# Patient Record
Sex: Female | Born: 1940 | Race: White | Hispanic: No | Marital: Married | Smoking: Never smoker
Health system: Southern US, Community
[De-identification: ages and names within clinical notes are randomized; demographics above are authoritative.]

## PROBLEM LIST (undated history)

## (undated) DIAGNOSIS — Z8669 Personal history of other diseases of the nervous system and sense organs: Secondary | ICD-10-CM

## (undated) DIAGNOSIS — H269 Unspecified cataract: Secondary | ICD-10-CM

## (undated) DIAGNOSIS — I2510 Atherosclerotic heart disease of native coronary artery without angina pectoris: Secondary | ICD-10-CM

## (undated) DIAGNOSIS — K219 Gastro-esophageal reflux disease without esophagitis: Secondary | ICD-10-CM

## (undated) DIAGNOSIS — K5900 Constipation, unspecified: Secondary | ICD-10-CM

## (undated) DIAGNOSIS — M2550 Pain in unspecified joint: Secondary | ICD-10-CM

## (undated) DIAGNOSIS — K5790 Diverticulosis of intestine, part unspecified, without perforation or abscess without bleeding: Secondary | ICD-10-CM

## (undated) DIAGNOSIS — C50919 Malignant neoplasm of unspecified site of unspecified female breast: Secondary | ICD-10-CM

## (undated) DIAGNOSIS — I10 Essential (primary) hypertension: Secondary | ICD-10-CM

## (undated) DIAGNOSIS — E876 Hypokalemia: Secondary | ICD-10-CM

## (undated) DIAGNOSIS — Z923 Personal history of irradiation: Secondary | ICD-10-CM

## (undated) DIAGNOSIS — R600 Localized edema: Secondary | ICD-10-CM

## (undated) DIAGNOSIS — E039 Hypothyroidism, unspecified: Secondary | ICD-10-CM

## (undated) DIAGNOSIS — E785 Hyperlipidemia, unspecified: Secondary | ICD-10-CM

## (undated) DIAGNOSIS — R609 Edema, unspecified: Secondary | ICD-10-CM

## (undated) DIAGNOSIS — M549 Dorsalgia, unspecified: Secondary | ICD-10-CM

## (undated) DIAGNOSIS — Z960 Presence of urogenital implants: Secondary | ICD-10-CM

## (undated) DIAGNOSIS — M2540 Effusion, unspecified joint: Secondary | ICD-10-CM

## (undated) DIAGNOSIS — G8929 Other chronic pain: Secondary | ICD-10-CM

## (undated) DIAGNOSIS — M1990 Unspecified osteoarthritis, unspecified site: Secondary | ICD-10-CM

## (undated) DIAGNOSIS — G4700 Insomnia, unspecified: Secondary | ICD-10-CM

## (undated) HISTORY — PX: COLONOSCOPY: SHX174

## (undated) HISTORY — PX: TONSILLECTOMY: SUR1361

## (undated) HISTORY — PX: ABDOMINAL HYSTERECTOMY: SHX81

## (undated) HISTORY — PX: CORONARY ANGIOPLASTY: SHX604

---

## 1993-02-14 HISTORY — PX: OTHER SURGICAL HISTORY: SHX169

## 1997-02-14 HISTORY — PX: CARDIAC CATHETERIZATION: SHX172

## 2000-06-26 ENCOUNTER — Inpatient Hospital Stay (HOSPITAL_COMMUNITY): Admission: RE | Admit: 2000-06-26 | Discharge: 2000-06-30 | Payer: Self-pay | Admitting: Neurosurgery

## 2000-09-29 ENCOUNTER — Ambulatory Visit (HOSPITAL_COMMUNITY): Admission: RE | Admit: 2000-09-29 | Discharge: 2000-09-29 | Payer: Self-pay | Admitting: Neurosurgery

## 2002-06-27 ENCOUNTER — Observation Stay (HOSPITAL_COMMUNITY): Admission: RE | Admit: 2002-06-27 | Discharge: 2002-06-28 | Payer: Self-pay | Admitting: Neurosurgery

## 2003-10-25 ENCOUNTER — Ambulatory Visit (HOSPITAL_COMMUNITY): Admission: RE | Admit: 2003-10-25 | Discharge: 2003-10-25 | Payer: Self-pay | Admitting: Neurosurgery

## 2004-02-03 ENCOUNTER — Ambulatory Visit: Payer: Self-pay | Admitting: Internal Medicine

## 2004-06-24 ENCOUNTER — Ambulatory Visit: Payer: Self-pay | Admitting: Internal Medicine

## 2004-08-22 ENCOUNTER — Ambulatory Visit: Payer: Self-pay | Admitting: Internal Medicine

## 2005-02-28 ENCOUNTER — Ambulatory Visit: Payer: Self-pay | Admitting: Internal Medicine

## 2005-11-30 ENCOUNTER — Ambulatory Visit: Payer: Self-pay | Admitting: Internal Medicine

## 2006-01-06 ENCOUNTER — Other Ambulatory Visit: Payer: Self-pay

## 2006-01-06 ENCOUNTER — Emergency Department: Payer: Self-pay | Admitting: Emergency Medicine

## 2006-03-02 ENCOUNTER — Ambulatory Visit: Payer: Self-pay | Admitting: Internal Medicine

## 2006-03-08 ENCOUNTER — Ambulatory Visit: Payer: Self-pay | Admitting: Internal Medicine

## 2006-03-16 ENCOUNTER — Ambulatory Visit: Payer: Self-pay | Admitting: Internal Medicine

## 2006-05-25 ENCOUNTER — Ambulatory Visit: Payer: Self-pay

## 2006-05-27 ENCOUNTER — Emergency Department: Payer: Self-pay

## 2006-08-31 ENCOUNTER — Inpatient Hospital Stay (HOSPITAL_COMMUNITY): Admission: RE | Admit: 2006-08-31 | Discharge: 2006-09-04 | Payer: Self-pay | Admitting: Orthopaedic Surgery

## 2006-10-23 ENCOUNTER — Ambulatory Visit: Payer: Self-pay | Admitting: Orthopaedic Surgery

## 2006-11-07 ENCOUNTER — Inpatient Hospital Stay (HOSPITAL_COMMUNITY): Admission: EM | Admit: 2006-11-07 | Discharge: 2006-11-11 | Payer: Self-pay | Admitting: Emergency Medicine

## 2006-11-10 ENCOUNTER — Ambulatory Visit: Payer: Self-pay | Admitting: Infectious Diseases

## 2006-11-13 ENCOUNTER — Ambulatory Visit: Payer: Self-pay | Admitting: Internal Medicine

## 2006-11-15 ENCOUNTER — Emergency Department: Payer: Self-pay | Admitting: Emergency Medicine

## 2006-11-15 ENCOUNTER — Other Ambulatory Visit: Payer: Self-pay

## 2006-11-24 ENCOUNTER — Ambulatory Visit: Payer: Self-pay | Admitting: Internal Medicine

## 2006-11-27 ENCOUNTER — Ambulatory Visit: Payer: Self-pay | Admitting: Internal Medicine

## 2006-12-04 ENCOUNTER — Ambulatory Visit: Payer: Self-pay | Admitting: Internal Medicine

## 2006-12-11 ENCOUNTER — Ambulatory Visit: Payer: Self-pay | Admitting: Internal Medicine

## 2007-02-15 HISTORY — PX: BACK SURGERY: SHX140

## 2007-03-27 ENCOUNTER — Ambulatory Visit: Payer: Self-pay | Admitting: Internal Medicine

## 2007-03-30 ENCOUNTER — Ambulatory Visit: Payer: Self-pay | Admitting: Internal Medicine

## 2007-07-05 ENCOUNTER — Ambulatory Visit: Payer: Self-pay

## 2008-04-15 ENCOUNTER — Ambulatory Visit: Payer: Self-pay | Admitting: Internal Medicine

## 2008-12-29 ENCOUNTER — Ambulatory Visit: Payer: Self-pay | Admitting: Unknown Physician Specialty

## 2009-05-18 ENCOUNTER — Ambulatory Visit: Payer: Self-pay | Admitting: Internal Medicine

## 2010-03-10 ENCOUNTER — Ambulatory Visit: Payer: Self-pay | Admitting: Internal Medicine

## 2010-06-14 ENCOUNTER — Ambulatory Visit: Payer: Self-pay | Admitting: Anesthesiology

## 2010-06-29 NOTE — Discharge Summary (Signed)
Laurie Hale, Laurie Hale                   ACCOUNT NO.:  [id number]   MEDICAL RECORD NO.:  [id number]          PATIENT TYPE:  INP   LOCATION:  6739                         FACILITY:  [facility]   PHYSICIAN:  Wilson Singer, M.D.DATE OF BIRTH:  04/03/1940   DATE OF ADMISSION:  11/06/2006  DATE OF DISCHARGE:  11/11/2006                               DISCHARGE SUMMARY   FINAL DISCHARGE DIAGNOSES:  1. Low back pain secondary probably to cystitis in the region of S1.  2. Hypertension.   CONDITION ON DISCHARGE:  Stable.   MEDICATIONS ON DISCHARGE:  1. Diovan 160 mg daily.  2. Ibuprofen 800 mg t.i.d.  3. Clonazepam 1 mg t.i.d.  4. Crestor 5 mg daily.  5. Skelaxin 800 mg q.i.d.  6. Ciprofloxacin 500 mg b.i.d. for 6 weeks.  7. Rifampicin 300 mg b.i.d. for 6 weeks.  8. Vancomycin IV daily for 6 weeks.  Pharmacy to monitor dose.      Currently at 1250 mg q.12h.  9. Tramadol 50 mg 2 tablets q.i.d. as needed.   HISTORY:  This very pleasant 70 year old lady was admitted complaining  of left low back and buttock pain.  Please see initial history and  physical examination done by Dr. Lilly Cove.   HOSPITAL COURSE:  The patient had an MRI of lumbosacral spine done, and  there were no major abnormalities except for some fluid.  The  radiologist felt there was significant diskitis in the L5-S1 region.  Dr. Sharolyn Douglas felt that this may not necessarily be the case.  However,  the patient underwent through interventional radiology an L5-S1 disk  aspiration and also fluid aspirate at the L3-L4 area.  So far the fluid  and the disk aspirated not growing organisms, and gram stain has been  negative.  Infectious disease was consulted, and they have added  rifampicin to the antibiotic regimen which includes vancomycin and  ciprofloxacin.  Therefore, the patient will continue with all the 3  antibiotics for about 6 weeks or unless Dr. Noel Gerold feels it would be  reasonable to discontinue them earlier.   EXAMINATION AND LABS ON DISCHARGE:  The patient has improved somewhat  since her admission.  Today she is feeling well but slightly weak.  Temperature 98.6, blood pressure 123/74, pulse 76, saturation 94% on  room air.  Investigations today show hemoglobin of 8.9, white blood cell  count 7.1, platelets 233.  Sodium 141, potassium 2.8, bicarbonate 26,  BUN 9, creatinine 0.65.   DISPOSITION:  We are giving her potassium chloride today to replete her  potassium, and this will need to be checked.  Also, she is anemic and  her hemoglobin will need to also be checked.  I suspect the anemia  reflects her chronic disease with the diskitis and possible  osteomyelitis.  As mentioned above, she will have a Home Health  Registered Nurse given the intravenous vancomycin, and the pharmacy will  note if that dose is adequate.  She has a PICC line in situ which will  enable the vancomycin to be given.  She will also receive  Home Health,  physical and occupational therapy to help her.      Wilson Singer, M.D.  Electronically Signed     NCG/MEDQ  D:  11/11/2006  T:  11/12/2006  Job:  [id number]

## 2010-06-29 NOTE — Op Note (Signed)
NAMEGAILYA, TAUER NO.:  [id number]   MEDICAL RECORD NO.:  [id number]          PATIENT TYPE:  INP   LOCATION:  5031                         FACILITY:  [facility]   PHYSICIAN:  Sharolyn Douglas, M.D.        DATE OF BIRTH:  04/08/1940   DATE OF PROCEDURE:  08/31/2006  DATE OF DISCHARGE:                               OPERATIVE REPORT   DIAGNOSES:  1. Lumbar degenerative scoliosis.  2. Lumbar degenerative spondylolisthesis.  3. Lumbar spinal stenosis.  4. Status post previous L4-5 and L3-4 laminectomy.  5. Intractable back and bilateral lower extremity pain, left greater      than right.   PROCEDURES:  1. Revision lumbar laminectomy bilaterally at L3-4 and revision      hemilaminectomies on the left side at L4-5 and L5-S1.  2. Posterior spinal arthrodesis, T10 through S1.  3. Segmental spinal instrumentation using the Abbott Spine System from      T10 down to S1 using pedicle screws and Universal clamp.  4. Transforaminal lumbar interbody fusion, L3-4 and L5-S1, with      placement of two PEEK cages.  5. Local autogenous bone graft supplemented with 45 mL Grafton      allograft and BMP.   SURGEON:  Sharolyn Douglas, MD.   ASSISTANTJill Side Mahar, PA   ANESTHESIA:  General endotracheal.   ESTIMATED BLOOD LOSS:  1500 mL, 3 units returned from Cell Saver and 2  units of allograft blood.   COMPLICATIONS:  None.   NEEDLE AND SPONGE COUNT:  Correct.   INDICATIONS:  The patient is a pleasant 70 year old female with  progressive back and bilateral lower extremity pain.  Her imaging  studies show a collapsing degenerative scoliosis with severe foraminal  and lateral recess narrowing at L3-4 and L4-5.  She has been refractory  to all conservative treatment modalities.  She failed a laminectomy  performed by a neurosurgeon.  She now presents for revision  decompression and fusion to stabilize her degenerative scoliosis.  Risks, benefits, and alternatives were reviewed.  The  patient elected to  proceed.   PROCEDURE:  After informed consent, she was taken to the operating room.  She underwent general endotracheal anesthesia without difficulty.  She  was given prophylactic IV antibiotics.  Neuro monitoring was established  in form of lower extremity EMGs and SSEPs.  The patient was carefully  turned prone onto the [employer] four-poster positioning frame, all bony  prominences padded, face and eyes protected at all times.  Back prepped,  draped in the usual sterile fashion.  A midline incision made from T10  down the sacrum.  Dissection was carried sharply through the deep fascia  and a careful subperiosteal exposure was carried out to the tips of the  transverse processes from T10 all the way down to the sacral ala.  Intraoperative x-rays were taken to confirm the levels.  Deep retractors  were placed.  The bony elements were degenerative and hypertrophied.  The previous laminotomy defects were noticed at L3-4 and L4-5 on the  left side.  The  exposure was quite difficult due to the amount of  arthritis.  Once the bony elements had been cleaned, osteotomies were  completed of the facet joints from T10-11 all the way down to L5-S1 in  preparation for the arthrodesis.  We then turned our attention to  placing pedicle screws on the left side.  We found the pedicles to be  quite small and again, due to the distorted anatomy, placement of screws  was quite difficult.  We utilized 5.5 x 35-mm screw at T11, 5.5 x 40-mm  screw at T12, 5.5 x 40-mm screw at L1, 5.5 x 40-mm screw and L2, 6.5 x  45-mm screws at L3 and L4, 6.5 x 35-mm screw at L5, and 7.5 x 30-mm  screws in the sacrum.  The bone quality was quite good and the screw  purchase was excellent.  We then prepared laminotomies at T9-10 and T10-  11 for placement of a Universal clamp.  Small laminotomies were created  by removing the ligamentum flavum, and this allowed for careful passage  of the universal clamp  underneath the lamina.  Great care was to avoid  putting any pressure on the underlying dura.  The Universal clamps were  placed x2, one on the left side and one on the right side at T10.  We  then turned our attention to placing Universal clamps on the right side  at T10, T11, T12, L1 and L2 using a similar technique.  We attempted to  place a pedicle screw starting at L1 on the right side but again found  that the pedicles were so small that we elected to abort placing screws  at these segments and simply used the Universal clamps.  We did place a  pedicle screw at L3 on the right side, which measured 6.5 x 45 mm and  also on the right side at L5, which measured 6.5 x 45 mm.  In the sacrum  a 7.5 x 30-mm screw was placed on the right.  Again the screw purchase  was excellent.  Each screw was stimulated using triggered EMGs and there  were no deleterious changes.  In addition, before placing the pedicle  screws they were palpated with a ball-tip feeler and there were no  breaches noted.   Once the instrumentation had been placed throughout the thoracolumbar  spine, we turned our attention to performing a revision laminectomy.  Starting at L3-4, the previous laminotomy defect was uncovered using  curettes and the interval between the dura and remaining lamina was  identified.  Using the high-speed bur and Kerrison punches, the  laminectomy was completed.  Again this was quite difficult due to the  amount of bony hypertrophy, with collapse of the spine within the  concavity of the curve, and also the previous surgery.  As the  decompression continued, we encountered a Prolene stitch which was in  the dura from her previous operation, which appeared to be on the nerve  root takeoff of L5.  There did not appear to be any CSF leakage.  The  nerve root itself appeared to be adherent to the underlying disk space  at L4-5.  We found that the lateral recess and foramen was severely  stenotic due  to the collapse and bony overgrowth.  Once we completed the  neurolysis and decompression, it was carried across the midline and to  the contralateral side, again identifying the L5 nerve root on the right  side.  We then performed hemilaminectomies  at L3-4, again encountering  some scar from the previous operation, and neurolysis was accomplished.  Because of the collapse, the rotatory subluxation as well as the  spondylolisthesis, the lateral recess was very stenotic at this level as  well.  We identified the L4 nerve root, which was widely decompressed,  and also the L3 nerve root as it was exiting, which was stenotic within  the foramen.  We also performed a hemilaminectomy at L5-S1 on the left  side.  Again we found stenosis in the lateral recess although not as bad  as the superjacent segments.   At this point we turned our attention to performing transforaminal  lumbar interbody fusions at L3-4 on the left and also at L5-S1 on the  left.  The interbody fusions were completed in order to further reduce  the scoliosis, decompress the neural foramen, and improve the fusion  rate.  The remaining facet joints were osteotomized at L3-4 and L5-S1.  The exiting transversing nerve roots were identified and protected at  all times.  Free-running EMGs were monitored.  Starting at L3-4, disk  space was entered and dilated up to 8 mm.  A radical diskectomy was  completed, decorticating the cartilaginous endplates.  The disk space  was packed with local bone graft along with Grafton allograft which had  been mixed with OP1 BMP.  A 9-mm FIGI PEEK cage was then packed with BMP-  Grafton mixture, inserted into the interspace, and tamped anteriorly.  It was purposely kept on the left side in order to bring about a partial  reduction of the concavity of the scoliosis.  We then performed a  similar procedure at L5-S1.  At this level we utilized an 11-mm FIGI  cage.  This time the cage was inserted  more obliquely.  Again the disk  space was cleaned and packed with the Grafton-BMP mixture as well as  local bone graft from the laminectomy.  During the TLIF procedures,  there were no deleterious changes in the EMGs.   At this point we turned our attention to completing the posterior spinal  arthrodesis by decorticating the transverse processes, sacral ala and  pars interarticularis.  We then packed the remaining bone graft mixture  of Grafton, BMP and local bone into the lateral gutters from T10 all the  way down to the sacrum.  We cut titanium rods to length, which were bent  into normal lordosis.  The rods were placed into the polyaxial screw  heads and the Universal clamps.  The Universal clamps were sequentially  tightened.  The locking caps were sheared off.  We applied distraction  between the L4-5 segment before shearing off the locking caps to provide  for some distraction of the foramen at this level because of the  previous scarring from the earlier operation.  We took an intraoperative  x-ray, which showed acceptable positioning of the instrumentation.  Hemostasis was achieved.  Gelfoam was left over the exposed epidural  space.  Deep Hemovac drain was left.  The deep fascia was closed with  interrupted #1 Vicryl suture, subcutaneous layer closed with 0 Vicryl  and 2-0 Vicryl, followed by a running 3-0 subcuticular Vicryl suture on  the skin edges.  Dermabond was applied, a sterile dressing placed.  The  patient was turned supine, extubated without difficulty and transferred  to recovery in stable condition neurologically intact.   It should be noted that my assistant, Verlin Fester, PA, was present  throughout the procedure  including the positioning and the exposure.  She worked with me during the decompression using loupes and headlight  magnification, providing suction and retraction of the neural elements.  She then assisted me with the instrumentation and the fusion as  well as  wound closure.      Sharolyn Douglas, M.D.  Electronically Signed     MC/MEDQ  D:  08/31/2006  T:  09/01/2006  Job:  [id number]

## 2010-06-29 NOTE — H&P (Signed)
Laurie Hale, QUE                   ACCOUNT NO.:  [id number]   MEDICAL RECORD NO.:  [id number]          PATIENT TYPE:  INP   LOCATION:  6739                         FACILITY:  [facility]   PHYSICIAN:  Wilson Singer, M.D.DATE OF BIRTH:  07/09/1940   DATE OF ADMISSION:  11/06/2006  DATE OF DISCHARGE:                              HISTORY & PHYSICAL   HISTORY:  This is a 70 year old lady who has been complaining of left  lower back/buttock pain for the last few days.  The reason she is in the  emergency room is that she has been unable to walk because of the pain.  She was seen in the emergency room and evaluated.  A MRI scan of the  lumbar sacral area was done, and there were no major abnormalities seen.  She is now being admitted for essentially pain control.   PAST SURGICAL HISTORY:  1. She had a T10 to S1 scoliosis fusion in July 2008 by Dr. Noel Gerold for      scoliosis.  2. C5-6, C6-7 extensive anterior cervical discectomy/decompression in      May of 2004.  3. Left L4/L5 microdiskectomy by Dr. Lovell Sheehan in May of 2002.   PAST MEDICAL HISTORY:  1. Hypertension.  2. Gastroesophageal reflux disease.  3. Coronary artery disease, stable with stenting procedure in 1999.   MEDICATIONS:  1. Clonazepam 1 mg t.i.d.  2. Diovan 160 mg every day.  3. Atenolol 12.5 mg every day.   ALLERGIES:  PENICILLIN.   SOCIAL HISTORY:  Married.  She does not smoke or drink excessive  alcohol.   REVIEW OF SYSTEMS:  Apart from the symptoms mentioned above, there are  no other symptoms referable to all systems reviewed.   PHYSICAL EXAMINATION:  VITAL SIGNS:  Temperature 98.3, blood pressure  130/78, pulse 80, respiratory rate 12, saturation 95%.  GENERAL:  She looks systemically well and does not appear to be in any  significant pain at rest.  However, on movement she is in some pain.  CARDIOVASCULAR:  Heart sounds are present and normal without murmurs.  LUNGS:  Clear.  ABDOMEN:  Soft, nontender.  NEUROLOGICAL:  There are no obvious focal neurological signs.  MUSCULOSKELETAL:  She appears to have tenderness in the left buttock  area consistent with where the pain is.   INVESTIGATIONS:  Hemoglobin 11.7, white blood cell count 12.9, platelets  335,000.  Sodium 139, potassium 4.4, bicarbonate 30, BUN 23, creatinine  0.61.  ESR 45.  MRI of the lumbar spine reportedly has been essentially  unremarkable with no evidence of abscess or any other acute pathology.  I do not have the full report as of this dictation.   IMPRESSION:  1. Left lower back/buttock pain, etiology unclear.  2. Hypertension.   PLAN:  1. Admit.  2. Analgesics, muscle relaxers, steroids as empirical therapy for      possible muscle spasm.  3. Physical and occupational therapy consults.  4. Monitor/control hypertension.   Further recommendations will depend on the patient's hospital progress.      Wilson Singer, M.D.  Electronically  Signed     NCG/MEDQ  D:  11/07/2006  T:  11/07/2006  Job:  [id number]   cc:   Sharolyn Douglas, M.D.

## 2010-06-29 NOTE — Discharge Summary (Signed)
Laurie Hale, Laurie Hale                   ACCOUNT NO.:  [id number]   MEDICAL RECORD NO.:  [id number]          PATIENT TYPE:  INP   LOCATION:  5031                         FACILITY:  [facility]   PHYSICIAN:  Sharolyn Douglas, M.D.        DATE OF BIRTH:  December 28, 1940   DATE OF ADMISSION:  08/31/2006  DATE OF DISCHARGE:  09/04/2006                               DISCHARGE SUMMARY   ADMITTING DIAGNOSES:  1. Degenerative scoliosis which is progressive.  2. Hypertension  3. Gastroesophageal reflux disease.   DISCHARGE DIAGNOSES:  1. Status post T10-S1 scoliosis fusion.  2. Postoperative blood loss anemia.  3. Postoperative hypokalemia.  4. Hypertension.  5. Gastroesophageal reflux disease.   PROCEDURE:  On August 31, 2006, the patient was taken to the operating  room for T10-S1 posterior spinal fusion with pedicle screws, rods and  universal cages.  Surgeon was Sharolyn Douglas, M.D., assistant [facility],  [facility].  Anesthesia used was general.   CONSULTATION:  None.   LABORATORY DATA:  CBC with differential preoperatively was within normal  limits.  Intraoperatively hemoglobin reached a low of 6.5.  She was  transfused 2 units packed red cells at that time.  Through her  postoperative course, her hemoglobin reached a low of 8.9, hematocrit  25.9.  On September 03, 2006, she was asymptomatic and did not require any  further treatment.  Complete metabolic panel preoperatively was within  normal limits.  Postoperatively, she did develop mild hypokalemia.  She  was treated with K-Dur and this improved up to normal prior to  discharge.  Coags were normal preoperatively.  UA preop was negative.  Blood typing was A+.  Antibody screen was negative.   EKG from August 29, 2006, showed normal sinus rhythm.  Nonspecific ST and  T-wave abnormality, unconfirmed on the chart.   X-rays were used intraoperatively for localization.  The patient will  also be getting a two-view of her lumbar spine prior to discharge today.   BRIEF  HISTORY:  Patient is a 70 year old female who has had a long  history of problems with her back.  She has been followed by Dr. Noel Gerold  for some time now.  Unfortunately, her scoliosis has been getting  progressively worse.  Her function as well has been declining and her  pain has been increasing.  She has failed to respond to conservative  treatment.  It was thought her best course of management to stop the  progression of the scoliosis would be a posterior spinal fusion  procedure.  Risks and benefits of this procedure were discussed at  length with the patient and her family by Dr. Noel Gerold.  She indicated  understanding and opted to proceed.   HOSPITAL COURSE:  On August 31, 2006, the patient was taken to the  operating room for the above procedure.  She tolerated this well without  any intraoperative complications and she was transferred to the recovery  room in stable condition.   Postoperatively, routine orthopedic spine protocol was followed.  She  progressed along very well.   Physical therapy  and occupational therapy were started on daily basis on  a progressive ambulation program.  They also worked on brace use and  back precautions.  She was independent and safe with all the above prior  to discharge.   She did develop mild hypokalemia which was treated with K-Dur and  responded nicely into the normal range by the following day, as  previously dictated.  Her postoperative blood loss anemia was mild and  continues to slowly drift down for several postoperative days.  She did  not develop any symptoms with the anemia.  She was transfused 2 units of  packed red blood cells intraoperatively and also did receive some cell  saver blood returned intraoperatively.  She is not require any  transfusions postoperatively.   By September 04, 2006, the patient had met all orthopedic goals.  She was  medically stable and ready for discharge home.   DISCHARGE/PLAN:  The patient is a 70 year old  female status post  scoliosis fusion doing well.   ACTIVITIES:  Daily ambulation program, brace on when she is up, back  precautions at all times, no lifting greater than 5 pounds, the patient  may shower.  Follow up 2 weeks postop with Dr. Noel Gerold.   DISCHARGE MEDICATIONS:  1. Norco for pain.  2. Robaxin for muscle spasm.  3. Multivitamin daily.  4. Calcium daily.  5. Colace twice daily.  6. Laxative as needed.  7. Avoid NSAIDs.   DIET:  Regular home diet as tolerated.   CONDITION ON DISCHARGE:  Stable, improved.   DISPOSITION:  The patient being discharged to her home with family  assistance, as well as home health physical therapy and occupational  therapy.     Verlin Fester, P.A.      Sharolyn Douglas, M.D.  Electronically Signed   CM/MEDQ  D:  09/04/2006  T:  09/04/2006  Job:  [id number]

## 2010-07-02 NOTE — H&P (Signed)
[place]. [facility]  Patient:    Laurie Hale, Laurie Hale                          MRN: [id number] Adm. Date:  [id number] Attending:  Tressie Stalker D                         History and Physical  CHIEF COMPLAINT:  Back pain and left leg pain.  HISTORY OF PRESENT ILLNESS:  The patient is a 70 year old white female who has had intermittent lumbago.  She began having severe pain in her back and down her left leg at the end of April 2002.  She failed all medical management and was worked up with lumbar MRI demonstrating herniated disk and was sent for my consultation.  The patient complains of left sided low back pain radiating into her left buttock, left hip and down her left leg.  She has numbness in her foot and has not improved despite medical management.  PAST MEDICAL HISTORY:  Positive for hypertension, coronary artery disease, diverticulosis.  PAST SURGICAL HISTORY:  Angioplasty and stenting in 1999.  She has had no recent symptoms.  She had a partial colectomy secondary to diverticulosis and hysterectomy 20 years ago secondary to "infection."  MEDICATIONS PRIOR TO ADMISSION: 1. Zoloft 100 mg p.o. q.d. 2. Lodine 400 mg p.o. q.d. 3. Skelaxin 400 mg t.i.d. p.r.n. 4. Atenolol 12.5 mg p.o. q.d. 5. Diovan 80 mg p.o. q.d. 6. Pravachol 10 mg p.o. q.d. 7. Vivelle patch 0.05 topically q3 days.  FAMILY HISTORY:  Noncontributory.  SOCIAL HISTORY:  The patient is married and has four children.  She lives in [place].  She is employed as a Solicitor for [employer].  She denies tobacco, ethanol and drug use.  REVIEW OF SYSTEMS:  Negative except as above.  PHYSICAL EXAMINATION:  GENERAL:  This is a pleasant, well-nourished, well-developed 70 year old white female in obvious distress complaining of back and left leg pain pain.  VITAL SIGNS:  Height 52", weight 160 pounds.  HEENT:  Normal.  NECK:  She has limited cervical range of motion.  Spurlings test  was positive on the right and negative on the left.  Lhermittes sign was not present.  THORAX:  Symmetric.  LUNGS:  Clear to auscultation.  HEART:  Regular rate and rhythm.  ABDOMEN:  Soft and nontender.  Mildly obese.  BACK:  There is no point tenderness or deformities.  Straight leg testing is positive on the left, negative on the right.  Faberes testing is equivocal bilaterally causing some back pain.  NEUROLOGIC:  The patient is alert and oriented x 3.  Cranial nerves 2-12 are grossly intact bilaterally.  Vision is grossly normal with corrective lenses. Hearing is grossly normal bilaterally.  Motor strength is 5/5 in the bilateral deltoid, biceps, triceps, hand grip, wrist extensors, interosseous, Psoas, quadriceps, gastrocnemius, extensor hallucis longus.  Cerebellar examination is intact to rapid alternating movements of the upper extremities bilaterally.  Sensory examination is normal to light touch and pinprick.  Sensation in all tested dermatomes bilaterally.  Deep tendon reflexes are 1-2/4 in the biceps, triceps, brachioradialis, quadriceps, gastrocnemius.  There are bilateral flexor plantar reflexes and no ankle clonus.  IMAGING STUDIES:  The patient had a lumbar MRI performed without contrast at [facility] on Jun 14, 2000.  She has a mild bulging disk and some left right facet arthropathy at L3-4.  There is some left lateral recess stenosis.  At L4-5 she has a herniated disk on the left compressing the left L5 nerve root.  L5-S1 is relatively normal.  She has some diffuse facet arthropathy.  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  I have discussed this issue with the patient and reviewed the MRI scan with her and her husband and pointed out the abnormalities.  Her symptoms seem consistent with left L4-5 radiculopathy caused by disk  herniation at L4-5.  I have discussed the various treatment options with her including doing nothing, continuing medical management, physical therapy, steroid injections and surgery.  I have described the procedure of left L4-5 microdiskectomy as well as left L3 laminotomy.  I have discussed the risks of surgery extensively.  The patient has weighed the risks, benefits and alternatives of surgery and wants to proceed with this surgery on 06/26/00. DD:  06/26/00 TD:  06/26/00 Job: [id number]

## 2010-07-02 NOTE — Op Note (Signed)
NAME:  Laurie Hale, Laurie Hale                             ACCOUNT NO.:  [id number]   MEDICAL RECORD NO.:  [id number]                   PATIENT TYPE:  INP   LOCATION:  3006                                 FACILITY:  [facility]   PHYSICIAN:  Cristi Loron, M.D.            DATE OF BIRTH:  1940/12/06   DATE OF PROCEDURE:  06/27/2002  DATE OF DISCHARGE:                                 OPERATIVE REPORT   BRIEF HISTORY:  The patient is a 70 year old white female who has suffered  from neck and arm pain.  She failed medical management and was worked up  with a cervical MRI which demonstrated degenerative disease and spondylosis  and stenosis at C5-6 and C6-7.  I discussed the various treatment options  with her.  The patient weighed the risks, benefits, and alternatives to  surgery and decided to proceed with a two-level anterior cervical diskectomy  fusion and plating.   PREOPERATIVE DIAGNOSIS:  C5-6 and C6-7 degenerative disease, spondylosis and  stenosis and cervical radiculopathy.   POSTOPERATIVE DIAGNOSIS:  C5-6 and C6-7 degenerative disease, spondylosis  and stenosis, and cervical radiculopathy.   PROCEDURE:  C5-6 and C6-7 extensive anterior cervical  diskectomy/decompression; interbody iliac crest allograft arthrodesis;  anterior cervical plating (Synthes titanium plate and screws).   SURGEON:  Cristi Loron, M.D.   ASSISTANT:  Stefani Dama, M.D.   ANESTHESIA:  General endotracheal.   ESTIMATED BLOOD LOSS:  100 cc.   SPECIMENS:  None.   DRAINS:  None.   COMPLICATIONS:  None.   DESCRIPTION OF PROCEDURE:  The patient was brought to the operating room by  the anesthesia team.  General endotracheal anesthesia was induced.  The  patient remained in the supine position.  A roll was placed under her  shoulder to place her neck in slight extension.  The anterior cervical  region was then prepared with Betadine scrub and Betadine solution.  Sterile  drapes were applied.  I then  injected the area to be incised with Marcaine  with epinephrine solution and then used the scalpel to make a transverse  incision in her left anterior neck.  I used Metzenbaum scissors to help  divide the platysma muscle and then to dissect medial to sternocleidomastoid  muscle, jugular vein and carotid artery.  I bluntly dissected down towards  the anterior cervical spine and carefully identified the esophagus and  retracted it medially.  I then cleared the soft tissue from the anterior  cervical spine using Kidner swabs.  We then inserted a bent spinal needle  into the upper exposed interspace and then obtained intraoperative  radiographs to confirm our location.  We then detached the medial border of  the longus colli muscles bilaterally from C5-6 and C6-7 intervertebral disk  space.  We then inserted the Caspar self-retaining retractor.  We began at  C5-6, i.e., we used a 15-blade scalpel to incise the  C5-6 intervertebral  disk space.  It was quite spondylotic and narrowed.  We inserted distraction  screws at C5 and C6 and then distracted the interspace and then performed a  partial diskectomy using the __________ forceps.  We used a high-speed drill  to decorticate the vertebral endplates at C5-6 and drill away the  spondylosis as well as to thin out the posterior longitudinal ligament.  We  then incised the thin ligament and then removed this using Kerrison punch  undercutting the endplates and decompressing the thecal sac.  We then  performed a generous foraminotomy about the bilateral C6 nerve roots.  We  then repeated this procedure in an analogous fashion at C6-7 decompressing  the C6-7 thecal sac and bilateral C7 nerve roots.   We now turned our attention to the arthrodesis.  We obtained iliac crest for  allograft bone grafts and fashioned them through these approximate  dimensions, 6 mm in height, 1 cm in depth.  We inserted one bone graft to be  distracted at C6-7 interspace  and then removed the distraction screw from  C7, placed it back in C5, distracted the C5-6 interspace and then placed the  second similarly shaped bone graft into the C5-6 interspace.  We removed the  distraction screws and there was a good snug fit of the bone graft at both  levels.  We now turned our attention to the anterior spinal instrumentation.  We drilled away some ventral spot losses at C5-6, C6-7 so that the plate  would lie down flat.  We obtained the appropriate length Synthes titanium  plate and laid it along the anterior aspect of C5 down to C7.  We drilled  holes at C5, C6 and C7 and then tapped the holes and then secured the plate  to the vertebral bodies by placing two 14-mm screws at C5, 6, and 7.  We  then took a radiograph.  It demonstrated good position of the upper plate  and we could not see the lower plate screws very well because of the  patient's body habitus, but they __________ , though.  Therefore, I secured  the screws into the plate by placing a locking screw at each screw.   We then obtained stringent hemostasis using bipolar electrocautery.  We then  removed the Caspar self-retaining retractor.  We inspected the esophagus for  any damage and there was none apparent.  We then reapproximated the  patient's platysma muscle with 3-0 Vicryl suture, subcutaneous tissue with 3-  0 Vicryl suture, and skin with Steri-Strips and benzoin.  The wound was then  coated with bacitracin ointment.  A sterile dressing was applied.  The  drapes were removed.  The patient was subsequently extubated by the  anesthesia team and transported to the postanesthesia care unit in stable  condition.  All sponge, instrument, and needle counts were correct at the  end of this case.                                               Cristi Loron, M.D.    JDJ/MEDQ  D:  06/27/2002  T:  06/28/2002  Job:  [id number]

## 2010-07-02 NOTE — Discharge Summary (Signed)
[place]  Patient:    Laurie Hale, Laurie Hale                          MRN: [id number] Adm. Date:  [id number] Disc. Date: [id number] Attending:  Tressie Stalker D                           Discharge Summary  For full details of this admission please refer to typed History and Physical.  BRIEF HISTORY:  The patient is a 70 year old white female who suffers from severe back and left buttock and leg pain.  She failed medical management and was worked up as as outpatient with a lumbar MRI that demonstrated a herniated disk at L4-L5 on the left as well as lateral recess stenosis at L3-L4 on the left.  I therefore discussed the risks, benefits, and alternatives to surgery, and the patient decided to proceed with a lumbar decompression diskectomy.  For Past Medical History, Past Surgical History, Medications Prior to Admission, Drug Allergies, Family Medical History, Social History, Admission Physical Exam, Imaging Studies, Assessment, Plan, etc., please refer to the typed History and Physical.  HOSPITAL COURSE:  I performed a left L4-L5 microdiskectomy using microdissection and left L3 laminotomy and foraminotomy using microdissection on Jun 26, 2000 (for full details of this operation, please refer to typed Operative Note).  Postoperative course:  The patients postoperative course was remarkable for some persistent pain into her left buttocks and her left leg.  This limited her ability to ambulate.  She was started on Neurontin, and it seemed to improve within a few days.  By Jun 30, 2000, the patient was afebrile, vital signs were stable, she was eating well, ambulating well, and her wound was healing well without signs of infection.  There was no discharge, and she was requesting discharge to home.  I therefore discharged her on May 31, 2000.  DISCHARGE INSTRUCTIONS:  The patient was given written discharge instructions.  DISCHARGE FOLLOWUP:  Instructed  to follow up with me in four weeks.  DISCHARGE MEDICATIONS: 1. Percocet 5 mg #60 one or two p.o. q.4h. p.r.n. pain (limit eight per day). 2. Valium 5 mg #50 one p.o. q.6h. p.r.n. muscle spasms, one refill. 3. Medrol Dosepak #1 take as directed, no refills. 4. Neurontin 300 mg #60 one p.o. b.i.d., one refill.  FINAL DIAGNOSIS: 1. L3-L4 spinal stenosis. 2. L4-L5 herniated nucleus pulposus. 3. Lumbago. 4. Lumbar radiculopathy.  PROCEDURE PERFORMED:  Left L3 laminotomy/foraminotomy, left L4-L5 microdiskectomy using microdissection. DD:  06/30/00 TD:  07/01/00 Job: [id number]

## 2010-07-02 NOTE — Op Note (Signed)
[place]. [facility]  Patient:    Laurie Hale, Laurie Hale                          MRN: [id number] Proc. Date: 06/26/00 Adm. Date:  [id number] Attending:  Tressie Stalker D                           Operative Report  PREOPERATIVE DIAGNOSIS:  Left L4-5 herniated nucleus pulposis, L3-4 and L4-L5 spinal stenosis, lumbar radiculopathy, lumbago.  POSTOPERATIVE DIAGNOSIS:  Left L4-5 herniated nucleus pulposis, L3-4 and L4-L5 spinal stenosis, lumbar radiculopathy, lumbago.  OPERATION:  Left L4-5 microdiskectomy using microdissection; left L3 laminotomy and foraminotomy using microdissection.  SURGEON:  Cristi Loron, M.D.  ASSISTANT:  Tanya Nones. Jeral Fruit, M.D.  ANESTHESIA:  General endotracheal.  ESTIMATED BLOOD LOSS:  100 cc.  SPECIMENS:  None.  DRAINS:  None.  INDICATIONS:  The patient is a 70 year old white female who suffers from severe back and left buttocks and leg pain.  She failed medical management and worked up for an outpatient with a lumbar MRI that demonstrated a herniated disk at L4-5 on the left as well as lateral recess stenosis at L3-4 on the left.  She had failed medical management.  I therefore discussed the risks, benefits and alternatives of surgery in this patient and I decided to proceed with a decompression.  DESCRIPTION OF PROCEDURE:  The patient was brought to the operating room by the anesthesia team.  General endotracheal anesthesia was induced.  The patient as then turned to the prone position on the Wilson frame.  Her lumbosacral region was then prepared with Betadine and scrubbed with Betadine solution.  Sterile drapes were applied.  I injected area to be incised with Marcaine with epinephrine solution.  I used a scalpel to make a vertical incision in the midline over the L3-4 and the L4-5 intervertebral disk space. I used electrocautery to dissect down to the thoracolumbar fascia and divided the divided to the left of midline  performing a left sided subperiosteal dissection, stripping the paraspinous musculature from the left spinous process and the lamina of L3, L4 and L5.  I inserted the McCullough retractor for exposure and then obtained intraoperative radiograph for conformation of location.  I then brought the operative microscope into the field, and under magnification and illumination I completed the microdissection/decompression. I used the high speed drill to perform a left L3 and L4 laminotomy.  I widened the laminotomy at L3 and L4 and removed the left L3-L4 and left L4-L5 ligamentum of flavum decompressing the underlying thecal sac.  There was lateral recess stenosis in the L3-4.  I alleviated this with mucosal punch and the angled curets decompressing the thecal sac and the left L4 nerve root freeing the spinal stenosis.  At L4-L5, the patient had a moderately large herniated nucleus pulposus which had migrated caudally and was compressing the left L5 nerve root.  I used microdissection to free up the herniated disk.  It was quite adherent to the overlying dura and the L5 nerve root.  I freed it up and removed the disk with the pituitary forceps and I performed a partial intervertebral diskectomy using pituitary forceps and curets.  I noted a small durotomy which was closed using a single figure-of-eight 6-0 Prolene suture. I then palpated about the thecal sac at L3-L4 and about the left L4 nerve root and L4-5 and left L5  nerve root and neural structures were well decompressed. I copiously irrigated the wound with Bacitracin solution and removed the solution.  Stringent hemostasis with bipolar electrocautery.  I laid some Gelfoam over the durotomy site.  I did not note any eggress of cerebrospinal fluid.  I then removed the McCullough retractor and reapproximated the patients thoracolumbar fascia with interrupted #1 Vicryl suture, subcutaneous suture with interrupted 2-0 Vicryl suture, and the skin  with Steri-Strips and Benzoin.  The wound was then covered with Bacitracin ointment and sterile dressing applied.  The drapes were removed, and the patient was subsequently returned to the supine position where she was extubated by the anesthesia team and transported to the post anesthesia care unit in stable condition.  All sponge, instrument and needle counts were correct at the end of this case. DD:  06/26/00 TD:  06/27/00 Job: [id number]

## 2010-10-20 ENCOUNTER — Ambulatory Visit: Payer: Self-pay | Admitting: Internal Medicine

## 2010-11-25 LAB — URINALYSIS, ROUTINE W REFLEX MICROSCOPIC
Bilirubin Urine: NEGATIVE
Glucose, UA: NEGATIVE
Hgb urine dipstick: NEGATIVE
Ketones, ur: NEGATIVE
Nitrite: NEGATIVE
Protein, ur: NEGATIVE
Urobilinogen, UA: 0.2
pH: 5

## 2010-11-25 LAB — SEDIMENTATION RATE
Sed Rate: 45 — ABNORMAL HIGH
Sed Rate: 69 — ABNORMAL HIGH

## 2010-11-25 LAB — COMPREHENSIVE METABOLIC PANEL
ALT: 14
ALT: 16
ALT: 19
AST: 9
Albumin: 2.3 — ABNORMAL LOW
Albumin: 2.6 — ABNORMAL LOW
Alkaline Phosphatase: 46
Alkaline Phosphatase: 53
Alkaline Phosphatase: 59
BUN: 21
BUN: 9
CO2: 25
CO2: 31
Calcium: 8.7
Calcium: 8.8
Chloride: 100
Chloride: 106
Chloride: 110
Creatinine, Ser: 0.61
GFR calc Af Amer: >60
GFR calc Af Amer: >60
GFR calc non Af Amer: >60
GFR calc non Af Amer: >60
Glucose, Bld: 100 — ABNORMAL HIGH
Glucose, Bld: 104 — ABNORMAL HIGH
Potassium: 2.8 — ABNORMAL LOW
Potassium: 3.5
Potassium: 3.7
Sodium: 140
Sodium: 141
Sodium: 144
Total Bilirubin: 0.6
Total Bilirubin: 0.6
Total Protein: 4.9 — ABNORMAL LOW

## 2010-11-25 LAB — CBC
HCT: 26.5 — ABNORMAL LOW
HCT: 27.7 — ABNORMAL LOW
HCT: 30.6 — ABNORMAL LOW
Hemoglobin: 10.4 — ABNORMAL LOW
Hemoglobin: 11.7 — ABNORMAL LOW
Hemoglobin: 8.9 — ABNORMAL LOW
Hemoglobin: 9.3 — ABNORMAL LOW
MCHC: 33.2
MCHC: 33.5
MCHC: 33.6
MCHC: 33.6
MCHC: 33.9
MCV: 93.7
MCV: 94.2
MCV: 94.3
MCV: 94.8
Platelets: 233
Platelets: 308
RBC: 2.83 — ABNORMAL LOW
RBC: 3.73 — ABNORMAL LOW
RDW: 15.4 — ABNORMAL HIGH
RDW: 15.5 — ABNORMAL HIGH
RDW: 16.3 — ABNORMAL HIGH
WBC: 7.1

## 2010-11-25 LAB — URINE CULTURE: Culture: NO GROWTH

## 2010-11-25 LAB — BODY FLUID CULTURE: Culture: NO GROWTH

## 2010-11-25 LAB — ANAEROBIC CULTURE

## 2010-11-25 LAB — BASIC METABOLIC PANEL
BUN: 23
Creatinine, Ser: 0.61
GFR calc Af Amer: >60
Potassium: 4.4

## 2010-11-25 LAB — AFB CULTURE WITH SMEAR (NOT AT ARMC)
Acid Fast Smear: NONE SEEN
Acid Fast Smear: NONE SEEN

## 2010-11-25 LAB — DIFFERENTIAL
Monocytes Relative: 7
Neutro Abs: 9.3 — ABNORMAL HIGH
Neutrophils Relative %: 73

## 2010-11-25 LAB — APTT: aPTT: 35

## 2010-11-29 LAB — TYPE AND SCREEN
ABO/RH(D): A POS
Antibody Screen: NEGATIVE

## 2010-11-29 LAB — POCT I-STAT 4, (NA,K, GLUC, HGB,HCT)
Glucose, Bld: 139 — ABNORMAL HIGH
Potassium: 3.3 — ABNORMAL LOW
Potassium: 3.4 — ABNORMAL LOW
Sodium: 142

## 2010-11-29 LAB — BASIC METABOLIC PANEL
BUN: 9
CO2: 28
CO2: 29
Calcium: 6.9 — ABNORMAL LOW
Calcium: 7.1 — ABNORMAL LOW
Calcium: 7.3 — ABNORMAL LOW
Creatinine, Ser: 0.6
Creatinine, Ser: 0.74
GFR calc Af Amer: >60
GFR calc Af Amer: >60
GFR calc Af Amer: >60
GFR calc non Af Amer: >60
GFR calc non Af Amer: >60
Glucose, Bld: 110 — ABNORMAL HIGH
Glucose, Bld: 93
Potassium: 4
Sodium: 139
Sodium: 141

## 2010-11-29 LAB — CBC
HCT: 25.9 — ABNORMAL LOW
Hemoglobin: 10.3 — ABNORMAL LOW
Hemoglobin: 8.9 — ABNORMAL LOW
MCHC: 34.1
MCHC: 34.4
MCHC: 34.5
MCV: 95
Platelets: 158
Platelets: 300
RBC: 2.72 — ABNORMAL LOW
RBC: 3.18 — ABNORMAL LOW
RBC: 3.64 — ABNORMAL LOW
RBC: 4.25
RDW: 13.8
RDW: 14.2 — ABNORMAL HIGH
WBC: 5.7

## 2010-11-29 LAB — COMPREHENSIVE METABOLIC PANEL
ALT: 20
AST: 20
Albumin: 3.7
Alkaline Phosphatase: 38 — ABNORMAL LOW
CO2: 29
Calcium: 9.5
Chloride: 105
Creatinine, Ser: 0.72
GFR calc Af Amer: >60
Sodium: 142
Total Bilirubin: 1

## 2010-11-29 LAB — DIFFERENTIAL
Basophils Absolute: 0
Basophils Relative: 0
Neutro Abs: 3.3
Neutrophils Relative %: 59

## 2010-11-29 LAB — APTT: aPTT: 27

## 2010-11-29 LAB — URINALYSIS, ROUTINE W REFLEX MICROSCOPIC
Ketones, ur: 15 — AB
Nitrite: NEGATIVE
Urobilinogen, UA: 0.2

## 2010-11-29 LAB — PROTIME-INR: INR: 1.1

## 2010-11-29 LAB — ABO/RH: ABO/RH(D): A POS

## 2011-02-15 HISTORY — PX: OTHER SURGICAL HISTORY: SHX169

## 2011-03-31 ENCOUNTER — Ambulatory Visit: Payer: Self-pay | Admitting: Anesthesiology

## 2011-05-16 ENCOUNTER — Ambulatory Visit: Payer: Self-pay

## 2011-05-20 ENCOUNTER — Ambulatory Visit: Payer: Self-pay | Admitting: General Practice

## 2011-05-20 LAB — POTASSIUM: Potassium: 3.7 mmol/L (ref 3.5–5.1)

## 2011-05-27 ENCOUNTER — Ambulatory Visit: Payer: Self-pay | Admitting: General Practice

## 2011-12-22 ENCOUNTER — Encounter (HOSPITAL_COMMUNITY): Payer: Self-pay | Admitting: Pharmacy Technician

## 2011-12-22 ENCOUNTER — Other Ambulatory Visit: Payer: Self-pay | Admitting: Orthopedic Surgery

## 2011-12-28 ENCOUNTER — Encounter (HOSPITAL_COMMUNITY): Payer: Self-pay

## 2011-12-28 ENCOUNTER — Encounter (HOSPITAL_COMMUNITY)
Admission: RE | Admit: 2011-12-28 | Discharge: 2011-12-28 | Disposition: A | Discharge status: Home / Self Care | Payer: Medicare HMO | Source: Ambulatory Visit | Attending: Orthopedic Surgery | Admitting: Orthopedic Surgery

## 2011-12-28 HISTORY — DX: Atherosclerotic heart disease of native coronary artery without angina pectoris: I25.10

## 2011-12-28 HISTORY — DX: Pain in unspecified joint: M25.50

## 2011-12-28 HISTORY — DX: Diverticulosis of intestine, part unspecified, without perforation or abscess without bleeding: K57.90

## 2011-12-28 HISTORY — DX: Effusion, unspecified joint: M25.40

## 2011-12-28 HISTORY — DX: Unspecified osteoarthritis, unspecified site: M19.90

## 2011-12-28 HISTORY — DX: Personal history of other diseases of the nervous system and sense organs: Z86.69

## 2011-12-28 HISTORY — DX: Other chronic pain: G89.29

## 2011-12-28 HISTORY — DX: Edema, unspecified: R60.9

## 2011-12-28 HISTORY — DX: Hypokalemia: E87.6

## 2011-12-28 HISTORY — DX: Hyperlipidemia, unspecified: E78.5

## 2011-12-28 HISTORY — DX: Dorsalgia, unspecified: M54.9

## 2011-12-28 HISTORY — DX: Presence of urogenital implants: Z96.0

## 2011-12-28 HISTORY — DX: Unspecified cataract: H26.9

## 2011-12-28 HISTORY — DX: Gastro-esophageal reflux disease without esophagitis: K21.9

## 2011-12-28 HISTORY — DX: Essential (primary) hypertension: I10

## 2011-12-28 HISTORY — DX: Localized edema: R60.0

## 2011-12-28 HISTORY — DX: Constipation, unspecified: K59.00

## 2011-12-28 HISTORY — DX: Insomnia, unspecified: G47.00

## 2011-12-28 HISTORY — DX: Hypothyroidism, unspecified: E03.9

## 2011-12-28 LAB — COMPREHENSIVE METABOLIC PANEL
Albumin: 4.1 g/dL (ref 3.5–5.2)
Alkaline Phosphatase: 54 U/L (ref 39–117)
BUN: 24 mg/dL — ABNORMAL HIGH (ref 6–23)
Chloride: 102 mEq/L (ref 96–112)
Creatinine, Ser: 1.04 mg/dL (ref 0.50–1.10)
GFR calc Af Amer: 60 mL/min — ABNORMAL LOW (ref >90)
GFR calc non Af Amer: 52 mL/min — ABNORMAL LOW (ref >90)
Glucose, Bld: 105 mg/dL — ABNORMAL HIGH (ref 70–99)
Potassium: 3.8 mEq/L (ref 3.5–5.1)
Total Bilirubin: 0.3 mg/dL (ref 0.3–1.2)

## 2011-12-28 LAB — URINE MICROSCOPIC-ADD ON

## 2011-12-28 LAB — URINALYSIS, ROUTINE W REFLEX MICROSCOPIC
Hgb urine dipstick: NEGATIVE
Ketones, ur: NEGATIVE mg/dL
Nitrite: NEGATIVE
Protein, ur: NEGATIVE mg/dL
Urobilinogen, UA: 1 mg/dL (ref 0.0–1.0)

## 2011-12-28 LAB — CBC WITH DIFFERENTIAL/PLATELET
Basophils Relative: 0 % (ref 0–1)
HCT: 40.4 % (ref 36.0–46.0)
Hemoglobin: 13.4 g/dL (ref 12.0–15.0)
Lymphs Abs: 1.7 10*3/uL (ref 0.7–4.0)
MCH: 30.2 pg (ref 26.0–34.0)
MCHC: 33.2 g/dL (ref 30.0–36.0)
Monocytes Absolute: 0.6 10*3/uL (ref 0.1–1.0)
Monocytes Relative: 10 % (ref 3–12)
Neutro Abs: 3.9 10*3/uL (ref 1.7–7.7)
Neutrophils Relative %: 61 % (ref 43–77)
RBC: 4.43 MIL/uL (ref 3.87–5.11)

## 2011-12-28 LAB — PROTIME-INR
INR: 1 (ref 0.00–1.49)
Prothrombin Time: 13.1 seconds (ref 11.6–15.2)

## 2011-12-28 LAB — TYPE AND SCREEN

## 2011-12-28 LAB — SURGICAL PCR SCREEN: Staphylococcus aureus: NEGATIVE

## 2011-12-28 MED ORDER — CHLORHEXIDINE GLUCONATE 4 % EX LIQD: 60.0000 mL | Freq: Once | CUTANEOUS | Status: DC

## 2011-12-28 NOTE — Progress Notes (Signed)
12/28/11 1555  OBSTRUCTIVE SLEEP APNEA  Score 4 or greater  Results sent to PCP

## 2011-12-28 NOTE — Progress Notes (Addendum)
[facility] in [place] is Rudi Coco PA and [facility] to request last office visit  Stress test done this year-to request from [facility] Echo test done this year and to request from [facility] cath in 1999 and another once since but not sure how long ago-to request from [facility]  EKG to be requested from [facility] CXR to also be requested from Rickey Primus with [facility]

## 2011-12-28 NOTE — Pre-Procedure Instructions (Signed)
20 Laurie Hale  12/28/2011   Your procedure is scheduled on:  Mon, Nov 18 @ 7:30 AM  Report to [facility] at 5:30 AM.  Call this number if you have problems the morning of surgery: [phone]   Remember:   Do not eat food:After Midnight.    Take these medicines the morning of surgery with A SIP OF WATER: Amlodipine(Norvasc),Levothyroxine(Synthroid),Zoloft(Sertraline),and Pain Pill(if needed)   Do not wear jewelry, make-up or nail polish.  Do not wear lotions, powders, or perfumes. You may wear deodorant.  Do not shave 48 hours prior to surgery.  Do not bring valuables to the hospital.  Contacts, dentures or bridgework may not be worn into surgery.  Leave suitcase in the car. After surgery it may be brought to your room.  For patients admitted to the hospital, checkout time is 11:00 AM the day of discharge.   Patients discharged the day of surgery will not be allowed to drive home.  Special Instructions: Shower using CHG 2 nights before surgery and the night before surgery.  If you shower the day of surgery use CHG.  Use special wash - you have one bottle of CHG for all showers.  You should use approximately 1/3 of the bottle for each shower.   Please read over the following fact sheets that you were given: Pain Booklet, Coughing and Deep Breathing, Blood Transfusion Information, Total Joint Packet, MRSA Information and Surgical Site Infection Prevention

## 2011-12-29 NOTE — Progress Notes (Signed)
Faxed request  For stress and echo to   Dr Gwen Pounds [phone].  No EKG  CXR received .Marland Kitchen

## 2012-01-01 MED ORDER — ACETAMINOPHEN 10 MG/ML IV SOLN
1000.0000 mg | Freq: Four times a day (QID) | INTRAVENOUS | Status: DC
  Administered 2012-01-02: 1000 mg via INTRAVENOUS
  Filled 2012-01-01 (×3): qty 100

## 2012-01-01 MED ORDER — CEFAZOLIN SODIUM-DEXTROSE 2-3 GM-% IV SOLR
2.0000 g | INTRAVENOUS | Status: DC
  Filled 2012-01-01: qty 50

## 2012-01-02 ENCOUNTER — Encounter (HOSPITAL_COMMUNITY): Payer: Self-pay | Admitting: Certified Registered Nurse Anesthetist

## 2012-01-02 ENCOUNTER — Inpatient Hospital Stay (HOSPITAL_COMMUNITY): Payer: Medicare HMO | Admitting: Certified Registered Nurse Anesthetist

## 2012-01-02 ENCOUNTER — Encounter (HOSPITAL_COMMUNITY)
Admission: RE | Disposition: A | Discharge status: Home Health | Payer: Self-pay | Source: Ambulatory Visit | Attending: Orthopedic Surgery

## 2012-01-02 ENCOUNTER — Inpatient Hospital Stay (HOSPITAL_COMMUNITY)
Admission: RE | Admit: 2012-01-02 | Discharge: 2012-01-04 | DRG: 470 | Disposition: A | Discharge status: Home Health | Payer: Medicare HMO | Source: Ambulatory Visit | Attending: Orthopedic Surgery | Admitting: Orthopedic Surgery

## 2012-01-02 ENCOUNTER — Encounter (HOSPITAL_COMMUNITY): Payer: Self-pay | Admitting: *Deleted

## 2012-01-02 ENCOUNTER — Inpatient Hospital Stay (HOSPITAL_COMMUNITY): Payer: Medicare HMO

## 2012-01-02 ENCOUNTER — Encounter (HOSPITAL_COMMUNITY): Payer: Self-pay | Admitting: General Practice

## 2012-01-02 DIAGNOSIS — E039 Hypothyroidism, unspecified: Secondary | ICD-10-CM | POA: Diagnosis present

## 2012-01-02 DIAGNOSIS — G4700 Insomnia, unspecified: Secondary | ICD-10-CM | POA: Diagnosis present

## 2012-01-02 DIAGNOSIS — Z7901 Long term (current) use of anticoagulants: Secondary | ICD-10-CM

## 2012-01-02 DIAGNOSIS — I2510 Atherosclerotic heart disease of native coronary artery without angina pectoris: Secondary | ICD-10-CM | POA: Diagnosis present

## 2012-01-02 DIAGNOSIS — M1711 Unilateral primary osteoarthritis, right knee: Secondary | ICD-10-CM

## 2012-01-02 DIAGNOSIS — Z881 Allergy status to other antibiotic agents status: Secondary | ICD-10-CM

## 2012-01-02 DIAGNOSIS — Z79899 Other long term (current) drug therapy: Secondary | ICD-10-CM

## 2012-01-02 DIAGNOSIS — I10 Essential (primary) hypertension: Secondary | ICD-10-CM | POA: Diagnosis present

## 2012-01-02 DIAGNOSIS — K219 Gastro-esophageal reflux disease without esophagitis: Secondary | ICD-10-CM | POA: Diagnosis present

## 2012-01-02 DIAGNOSIS — Z880 Allergy status to penicillin: Secondary | ICD-10-CM

## 2012-01-02 DIAGNOSIS — D62 Acute posthemorrhagic anemia: Secondary | ICD-10-CM | POA: Diagnosis not present

## 2012-01-02 DIAGNOSIS — H269 Unspecified cataract: Secondary | ICD-10-CM | POA: Diagnosis present

## 2012-01-02 DIAGNOSIS — K5730 Diverticulosis of large intestine without perforation or abscess without bleeding: Secondary | ICD-10-CM | POA: Diagnosis present

## 2012-01-02 DIAGNOSIS — G8929 Other chronic pain: Secondary | ICD-10-CM | POA: Diagnosis present

## 2012-01-02 DIAGNOSIS — E785 Hyperlipidemia, unspecified: Secondary | ICD-10-CM | POA: Diagnosis present

## 2012-01-02 DIAGNOSIS — Z9861 Coronary angioplasty status: Secondary | ICD-10-CM

## 2012-01-02 DIAGNOSIS — Z01812 Encounter for preprocedural laboratory examination: Secondary | ICD-10-CM

## 2012-01-02 DIAGNOSIS — M1710 Unilateral primary osteoarthritis, unspecified knee: Principal | ICD-10-CM | POA: Diagnosis present

## 2012-01-02 HISTORY — PX: TOTAL KNEE ARTHROPLASTY: SHX125

## 2012-01-02 SURGERY — ARTHROPLASTY, KNEE, TOTAL
Anesthesia: Regional | Site: Knee | Laterality: Right | Wound class: Clean

## 2012-01-02 MED ORDER — SERTRALINE HCL 100 MG PO TABS
100.0000 mg | ORAL_TABLET | Freq: Every day | ORAL | Status: DC
  Administered 2012-01-03 – 2012-01-04 (×2): 100 mg via ORAL
  Filled 2012-01-02 (×3): qty 1

## 2012-01-02 MED ORDER — PROPOFOL 10 MG/ML IV BOLUS
INTRAVENOUS | Status: DC | PRN
  Administered 2012-01-02: 50 mg via INTRAVENOUS
  Administered 2012-01-02: 150 mg via INTRAVENOUS

## 2012-01-02 MED ORDER — AMLODIPINE BESYLATE 5 MG PO TABS
5.0000 mg | ORAL_TABLET | Freq: Every day | ORAL | Status: DC
Start: 2012-01-02 — End: 2012-01-04
  Administered 2012-01-03 – 2012-01-04 (×2): 5 mg via ORAL
  Filled 2012-01-02 (×3): qty 1

## 2012-01-02 MED ORDER — TEMAZEPAM 15 MG PO CAPS
30.0000 mg | ORAL_CAPSULE | Freq: Every evening | ORAL | Status: DC | PRN
  Administered 2012-01-02 – 2012-01-03 (×2): 30 mg via ORAL
  Filled 2012-01-02: qty 1
  Filled 2012-01-02: qty 2

## 2012-01-02 MED ORDER — BUPIVACAINE-EPINEPHRINE PF 0.25-1:200000 % IJ SOLN
INTRAMUSCULAR | Status: AC
  Filled 2012-01-02: qty 30

## 2012-01-02 MED ORDER — ACETAMINOPHEN 325 MG PO TABS: 650.0000 mg | ORAL_TABLET | Freq: Four times a day (QID) | ORAL | Status: DC | PRN

## 2012-01-02 MED ORDER — MELOXICAM 7.5 MG PO TABS
7.5000 mg | ORAL_TABLET | Freq: Every day | ORAL | Status: DC
  Administered 2012-01-03 – 2012-01-04 (×2): 7.5 mg via ORAL
  Filled 2012-01-02 (×3): qty 1

## 2012-01-02 MED ORDER — HYDROMORPHONE HCL PF 1 MG/ML IJ SOLN
INTRAMUSCULAR | Status: AC
  Filled 2012-01-02: qty 1

## 2012-01-02 MED ORDER — MIDAZOLAM HCL 5 MG/5ML IJ SOLN
INTRAMUSCULAR | Status: DC | PRN
  Administered 2012-01-02: 2 mg via INTRAVENOUS

## 2012-01-02 MED ORDER — ACETAMINOPHEN 650 MG RE SUPP: 650.0000 mg | Freq: Four times a day (QID) | RECTAL | Status: DC | PRN

## 2012-01-02 MED ORDER — ONDANSETRON HCL 4 MG/2ML IJ SOLN
4.0000 mg | Freq: Four times a day (QID) | INTRAMUSCULAR | Status: DC | PRN
  Administered 2012-01-03 (×2): 4 mg via INTRAVENOUS
  Filled 2012-01-02 (×2): qty 2

## 2012-01-02 MED ORDER — MEPERIDINE HCL 25 MG/ML IJ SOLN
INTRAMUSCULAR | Status: AC
  Filled 2012-01-02: qty 1

## 2012-01-02 MED ORDER — POTASSIUM CHLORIDE ER 10 MEQ PO TBCR
10.0000 meq | EXTENDED_RELEASE_TABLET | Freq: Three times a day (TID) | ORAL | Status: DC
  Administered 2012-01-02 – 2012-01-04 (×6): 10 meq via ORAL
  Filled 2012-01-02 (×8): qty 1

## 2012-01-02 MED ORDER — MORPHINE SULFATE ER 15 MG PO TBCR
60.0000 mg | EXTENDED_RELEASE_TABLET | Freq: Three times a day (TID) | ORAL | Status: DC | PRN
  Administered 2012-01-03 – 2012-01-04 (×4): 60 mg via ORAL
  Filled 2012-01-02 (×5): qty 4

## 2012-01-02 MED ORDER — SODIUM CHLORIDE 0.9 % IR SOLN
Status: DC | PRN
  Administered 2012-01-02: 3000 mL

## 2012-01-02 MED ORDER — FENTANYL CITRATE 0.05 MG/ML IJ SOLN
INTRAMUSCULAR | Status: DC | PRN
  Administered 2012-01-02 (×4): 50 ug via INTRAVENOUS
  Administered 2012-01-02 (×2): 100 ug via INTRAVENOUS
  Administered 2012-01-02: 50 ug via INTRAVENOUS

## 2012-01-02 MED ORDER — HYDROMORPHONE HCL PF 1 MG/ML IJ SOLN
0.2500 mg | INTRAMUSCULAR | Status: AC | PRN
  Administered 2012-01-02 (×8): 0.5 mg via INTRAVENOUS

## 2012-01-02 MED ORDER — LEVOTHYROXINE SODIUM 50 MCG PO TABS
50.0000 ug | ORAL_TABLET | Freq: Every day | ORAL | Status: DC
  Administered 2012-01-03 – 2012-01-04 (×2): 50 ug via ORAL
  Filled 2012-01-02 (×4): qty 1

## 2012-01-02 MED ORDER — LISINOPRIL 40 MG PO TABS
40.0000 mg | ORAL_TABLET | Freq: Every day | ORAL | Status: DC
  Administered 2012-01-03 – 2012-01-04 (×2): 40 mg via ORAL
  Filled 2012-01-02 (×3): qty 1

## 2012-01-02 MED ORDER — OXYCODONE HCL 5 MG/5ML PO SOLN: 5.0000 mg | Freq: Once | ORAL | Status: AC | PRN

## 2012-01-02 MED ORDER — OXYCODONE HCL 5 MG PO TABS
ORAL_TABLET | ORAL | Status: AC
  Filled 2012-01-02: qty 1

## 2012-01-02 MED ORDER — LACTATED RINGERS IV SOLN
INTRAVENOUS | Status: DC | PRN
  Administered 2012-01-02 (×2): via INTRAVENOUS

## 2012-01-02 MED ORDER — ONDANSETRON HCL 4 MG PO TABS
4.0000 mg | ORAL_TABLET | Freq: Four times a day (QID) | ORAL | Status: DC | PRN
  Administered 2012-01-04: 4 mg via ORAL
  Filled 2012-01-02: qty 1

## 2012-01-02 MED ORDER — ACETAMINOPHEN 10 MG/ML IV SOLN
INTRAVENOUS | Status: AC
  Filled 2012-01-02: qty 100

## 2012-01-02 MED ORDER — ONDANSETRON HCL 4 MG/2ML IJ SOLN: 4.0000 mg | Freq: Once | INTRAMUSCULAR | Status: DC | PRN

## 2012-01-02 MED ORDER — VANCOMYCIN HCL IN DEXTROSE 1-5 GM/200ML-% IV SOLN
1000.0000 mg | Freq: Two times a day (BID) | INTRAVENOUS | Status: AC
  Administered 2012-01-02: 1000 mg via INTRAVENOUS
  Filled 2012-01-02: qty 200

## 2012-01-02 MED ORDER — VANCOMYCIN HCL IN DEXTROSE 1-5 GM/200ML-% IV SOLN
INTRAVENOUS | Status: AC
  Administered 2012-01-02: 1000 mg via INTRAVENOUS
  Filled 2012-01-02: qty 200

## 2012-01-02 MED ORDER — ALUM & MAG HYDROXIDE-SIMETH 200-200-20 MG/5ML PO SUSP: 30.0000 mL | ORAL | Status: DC | PRN

## 2012-01-02 MED ORDER — ENOXAPARIN SODIUM 30 MG/0.3ML ~~LOC~~ SOLN
30.0000 mg | Freq: Two times a day (BID) | SUBCUTANEOUS | Status: DC
  Administered 2012-01-03 – 2012-01-04 (×3): 30 mg via SUBCUTANEOUS
  Filled 2012-01-02 (×5): qty 0.3

## 2012-01-02 MED ORDER — PHENOL 1.4 % MT LIQD: 1.0000 | OROMUCOSAL | Status: DC | PRN

## 2012-01-02 MED ORDER — ACETAMINOPHEN 10 MG/ML IV SOLN
1000.0000 mg | Freq: Four times a day (QID) | INTRAVENOUS | Status: AC
  Administered 2012-01-02 – 2012-01-03 (×4): 1000 mg via INTRAVENOUS
  Filled 2012-01-02 (×4): qty 100

## 2012-01-02 MED ORDER — MEPERIDINE HCL 25 MG/ML IJ SOLN
6.2500 mg | INTRAMUSCULAR | Status: DC | PRN
  Administered 2012-01-02: 12.5 mg via INTRAVENOUS

## 2012-01-02 MED ORDER — BUPIVACAINE 0.25 % ON-Q PUMP SINGLE CATH 300ML
300.0000 mL | INJECTION | Status: AC
  Administered 2012-01-02: 300 mL
  Filled 2012-01-02: qty 300

## 2012-01-02 MED ORDER — SODIUM CHLORIDE 0.9 % IV SOLN
INTRAVENOUS | Status: DC
Start: 2012-01-02 — End: 2012-01-02

## 2012-01-02 MED ORDER — HYDROCHLOROTHIAZIDE 25 MG PO TABS
25.0000 mg | ORAL_TABLET | Freq: Every day | ORAL | Status: DC
  Administered 2012-01-03 – 2012-01-04 (×2): 25 mg via ORAL
  Filled 2012-01-02 (×3): qty 1

## 2012-01-02 MED ORDER — METOCLOPRAMIDE HCL 10 MG PO TABS
5.0000 mg | ORAL_TABLET | Freq: Three times a day (TID) | ORAL | Status: DC | PRN
  Administered 2012-01-03: 10 mg via ORAL
  Filled 2012-01-02: qty 1

## 2012-01-02 MED ORDER — METOCLOPRAMIDE HCL 5 MG/ML IJ SOLN: 5.0000 mg | Freq: Three times a day (TID) | INTRAMUSCULAR | Status: DC | PRN

## 2012-01-02 MED ORDER — HYDROMORPHONE HCL PF 1 MG/ML IJ SOLN
1.0000 mg | INTRAMUSCULAR | Status: DC | PRN
  Administered 2012-01-02 – 2012-01-04 (×11): 1 mg via INTRAVENOUS
  Filled 2012-01-02 (×11): qty 1

## 2012-01-02 MED ORDER — SODIUM CHLORIDE 0.9 % IV SOLN
INTRAVENOUS | Status: DC
  Administered 2012-01-02 – 2012-01-03 (×2): via INTRAVENOUS

## 2012-01-02 MED ORDER — OXYCODONE HCL 5 MG PO TABS
5.0000 mg | ORAL_TABLET | Freq: Once | ORAL | Status: AC | PRN
  Administered 2012-01-02: 5 mg via ORAL

## 2012-01-02 MED ORDER — LIDOCAINE HCL (CARDIAC) 20 MG/ML IV SOLN
INTRAVENOUS | Status: DC | PRN
  Administered 2012-01-02: 100 mg via INTRAVENOUS

## 2012-01-02 MED ORDER — MENTHOL 3 MG MT LOZG: 1.0000 | LOZENGE | OROMUCOSAL | Status: DC | PRN

## 2012-01-02 MED ORDER — ATORVASTATIN CALCIUM 10 MG PO TABS
10.0000 mg | ORAL_TABLET | Freq: Every day | ORAL | Status: DC
  Administered 2012-01-02 – 2012-01-03 (×2): 10 mg via ORAL
  Filled 2012-01-02 (×3): qty 1

## 2012-01-02 MED ORDER — OXYCODONE HCL 5 MG PO TABS
5.0000 mg | ORAL_TABLET | ORAL | Status: DC | PRN
  Administered 2012-01-02 – 2012-01-04 (×10): 10 mg via ORAL
  Filled 2012-01-02 (×10): qty 2

## 2012-01-02 MED ORDER — HYDROMORPHONE HCL PF 1 MG/ML IJ SOLN
INTRAMUSCULAR | Status: AC
  Filled 2012-01-02: qty 2

## 2012-01-02 MED ORDER — BUPIVACAINE-EPINEPHRINE 0.25% -1:200000 IJ SOLN
INTRAMUSCULAR | Status: DC | PRN
  Administered 2012-01-02: 30 mL

## 2012-01-02 MED ORDER — BUPIVACAINE-EPINEPHRINE PF 0.5-1:200000 % IJ SOLN
INTRAMUSCULAR | Status: DC | PRN
  Administered 2012-01-02: 30 mL

## 2012-01-02 SURGICAL SUPPLY — 60 items
BANDAGE ESMARK 6X9 LF (GAUZE/BANDAGES/DRESSINGS) ×1 IMPLANT
BLADE SAGITTAL 13X1.27X60 (BLADE) ×2 IMPLANT
BLADE SAW SGTL 83.5X18.5 (BLADE) ×2 IMPLANT
BNDG CMPR 9X6 STRL LF SNTH (GAUZE/BANDAGES/DRESSINGS) ×1
BNDG ESMARK 6X9 LF (GAUZE/BANDAGES/DRESSINGS) ×2
BOWL SMART MIX CTS (DISPOSABLE) ×2 IMPLANT
CATH KIT ON Q 5IN SLV (PAIN MANAGEMENT) ×2 IMPLANT
CEMENT BONE SIMPLEX SPEEDSET (Cement) ×4 IMPLANT
CLOTH BEACON ORANGE TIMEOUT ST (SAFETY) ×2 IMPLANT
COVER BACK TABLE 24X17X13 BIG (DRAPES) IMPLANT
COVER SURGICAL LIGHT HANDLE (MISCELLANEOUS) ×2 IMPLANT
CUFF TOURNIQUET SINGLE 34IN LL (TOURNIQUET CUFF) ×2 IMPLANT
DRAPE EXTREMITY T 121X128X90 (DRAPE) ×2 IMPLANT
DRAPE INCISE IOBAN 66X45 STRL (DRAPES) ×4 IMPLANT
DRAPE PROXIMA HALF (DRAPES) ×2 IMPLANT
DRAPE U-SHAPE 47X51 STRL (DRAPES) ×2 IMPLANT
DRSG ADAPTIC 3X8 NADH LF (GAUZE/BANDAGES/DRESSINGS) ×2 IMPLANT
DRSG PAD ABDOMINAL 8X10 ST (GAUZE/BANDAGES/DRESSINGS) ×2 IMPLANT
DURAPREP 26ML APPLICATOR (WOUND CARE) ×4 IMPLANT
ELECT REM PT RETURN 9FT ADLT (ELECTROSURGICAL) ×2
ELECTRODE REM PT RTRN 9FT ADLT (ELECTROSURGICAL) ×1 IMPLANT
EVACUATOR 1/8 PVC DRAIN (DRAIN) ×2 IMPLANT
GLOVE BIOGEL M 7.0 STRL (GLOVE) IMPLANT
GLOVE BIOGEL PI IND STRL 7.5 (GLOVE) IMPLANT
GLOVE BIOGEL PI IND STRL 8.5 (GLOVE) ×1 IMPLANT
GLOVE BIOGEL PI INDICATOR 7.5 (GLOVE)
GLOVE BIOGEL PI INDICATOR 8.5 (GLOVE) ×1
GLOVE BIOGEL PI ORTHO PRO SZ8 (GLOVE) ×1
GLOVE PI ORTHO PRO STRL SZ8 (GLOVE) ×1 IMPLANT
GLOVE SURG ORTHO 8.0 STRL STRW (GLOVE) ×4 IMPLANT
GOWN PREVENTION PLUS XLARGE (GOWN DISPOSABLE) ×4 IMPLANT
GOWN STRL NON-REIN LRG LVL3 (GOWN DISPOSABLE) ×4 IMPLANT
HANDPIECE INTERPULSE COAX TIP (DISPOSABLE) ×2
HOOD PEEL AWAY FACE SHEILD DIS (HOOD) ×8 IMPLANT
KIT BASIN OR (CUSTOM PROCEDURE TRAY) ×2 IMPLANT
KIT ROOM TURNOVER OR (KITS) ×2 IMPLANT
MANIFOLD NEPTUNE II (INSTRUMENTS) ×2 IMPLANT
NEEDLE 22X1 1/2 (OR ONLY) (NEEDLE) ×2 IMPLANT
NS IRRIG 1000ML POUR BTL (IV SOLUTION) ×2 IMPLANT
PACK TOTAL JOINT (CUSTOM PROCEDURE TRAY) ×2 IMPLANT
PAD ARMBOARD 7.5X6 YLW CONV (MISCELLANEOUS) ×4 IMPLANT
PAD CAST 4YDX4 CTTN HI CHSV (CAST SUPPLIES) ×1 IMPLANT
PADDING CAST COTTON 4X4 STRL (CAST SUPPLIES) ×2
PADDING CAST COTTON 6X4 STRL (CAST SUPPLIES) ×2 IMPLANT
POSITIONER HEAD PRONE TRACH (MISCELLANEOUS) ×2 IMPLANT
SET HNDPC FAN SPRY TIP SCT (DISPOSABLE) ×1 IMPLANT
SPONGE GAUZE 4X4 12PLY (GAUZE/BANDAGES/DRESSINGS) ×2 IMPLANT
STAPLER VISISTAT 35W (STAPLE) ×2 IMPLANT
SUCTION FRAZIER TIP 10 FR DISP (SUCTIONS) ×2 IMPLANT
SUT BONE WAX W31G (SUTURE) ×2 IMPLANT
SUT VIC AB 0 CTB1 27 (SUTURE) ×4 IMPLANT
SUT VIC AB 1 CT1 27 (SUTURE) ×4
SUT VIC AB 1 CT1 27XBRD ANBCTR (SUTURE) ×2 IMPLANT
SUT VIC AB 2-0 CT1 27 (SUTURE) ×4
SUT VIC AB 2-0 CT1 TAPERPNT 27 (SUTURE) ×2 IMPLANT
SYR CONTROL 10ML LL (SYRINGE) ×2 IMPLANT
TOWEL OR 17X24 6PK STRL BLUE (TOWEL DISPOSABLE) ×2 IMPLANT
TOWEL OR 17X26 10 PK STRL BLUE (TOWEL DISPOSABLE) ×2 IMPLANT
TRAY FOLEY CATH 14FR (SET/KITS/TRAYS/PACK) ×2 IMPLANT
WATER STERILE IRR 1000ML POUR (IV SOLUTION) ×6 IMPLANT

## 2012-01-02 NOTE — Progress Notes (Signed)
CARE MANAGEMENT NOTE 01/02/2012  Patient:  Laurie Hale, Laurie Hale   Account Number:  [id number]  Date Initiated:  01/02/2012  Documentation initiated by:  Vance Peper  Subjective/Objective Assessment:   71 yr old female s/p right total knee arthroplasty.     Action/Plan:   Patient preoperatively setup with [facility], no changes. Fresh postop- will F/U when awake.   Anticipated DC Date:     Anticipated DC Plan:  HOME W HOME HEALTH SERVICES         Choice offered to / List presented to:             Status of service:  In process, will continue to follow Medicare Important Message given?   (If response is "NO", the following Medicare IM given date fields will be blank) Date Medicare IM given:   Date Additional Medicare IM given:    Discharge Disposition:    Per UR Regulation:    If discussed at Long Length of Stay Meetings, dates discussed:    Comments:

## 2012-01-02 NOTE — Preoperative (Signed)
Beta Blockers   Reason not to administer Beta Blockers:Not Applicable 

## 2012-01-02 NOTE — Transfer of Care (Signed)
Immediate Anesthesia Transfer of Care Note  Patient: Laurie Hale  Procedure(s) Performed: Procedure(s) (LRB) with comments: TOTAL KNEE ARTHROPLASTY (Right)  Patient Location: PACU  Anesthesia Type:General  Level of Consciousness: awake, alert , oriented and patient cooperative  Airway & Oxygen Therapy: Patient Spontanous Breathing and Patient connected to nasal cannula oxygen  Post-op Assessment: Report given to PACU RN, Post -op Vital signs reviewed and stable and Patient moving all extremities  Post vital signs: Reviewed and stable  Complications: No apparent anesthesia complications

## 2012-01-02 NOTE — Anesthesia Procedure Notes (Addendum)
Anesthesia Regional Block:  Femoral nerve block  Pre-Anesthetic Checklist: ,, timeout performed, Correct Patient, Correct Site, Correct Laterality, Correct Procedure, Correct Position, site marked, Risks and benefits discussed,  Surgical consent,  Pre-op evaluation,  At surgeon's request and post-op pain management  Laterality: Right  Prep: chloraprep       Needles:  Injection technique: Single-shot  Needle Type: Echogenic Stimulator Needle          Additional Needles:  Procedures: ultrasound guided (picture in chart) and nerve stimulator Femoral nerve block  Nerve Stimulator or Paresthesia:  Response: 0.4 mA,   Additional Responses:   Narrative:  Start time: 01/02/2012 7:05 AM End time: 01/02/2012 7:20 AM Injection made incrementally with aspirations every 5 mL.  Performed by: Personally  Anesthesiologist: Arta Bruce MD  Additional Notes: Monitors applied. Patient sedated. Sterile prep and drape,hand hygiene and sterile gloves were used. Relevant anatomy identified.Needle position confirmed.Local anesthetic injected incrementally after negative aspiration. Local anesthetic spread visualized around nerve(s). Vascular puncture avoided. No complications. Image printed for medical record.The patient tolerated the procedure well.       Femoral nerve block Procedure Name: LMA Insertion Date/Time: 01/02/2012 7:43 AM Performed by: Rogelia Boga Pre-anesthesia Checklist: Patient identified, Emergency Drugs available, Suction available, Patient being monitored and Timeout performed Patient Re-evaluated:Patient Re-evaluated prior to inductionOxygen Delivery Method: Circle system utilized Preoxygenation: Pre-oxygenation with 100% oxygen Intubation Type: IV induction LMA: LMA inserted LMA Size: 4.0 Tube type: Oral Number of attempts: 1 Placement Confirmation: positive ETCO2 and breath sounds checked- equal and bilateral Tube secured with: Tape Dental Injury: Teeth and  Oropharynx as per pre-operative assessment

## 2012-01-02 NOTE — Progress Notes (Signed)
Orthopedic Tech Progress Note Patient Details:  KAELEE PFEFFER 1940-10-24 [id number] CPM applied with appropriate settings; OHF applied to bed CPM Right Knee CPM Right Knee: On Right Knee Flexion (Degrees): 90  Right Knee Extension (Degrees): 0    Asia R Thompson 01/02/2012, 10:27 AM

## 2012-01-02 NOTE — Anesthesia Preprocedure Evaluation (Addendum)
Anesthesia Evaluation  Patient identified by MRN, date of birth, ID band Patient awake    Reviewed: Allergy & Precautions, H&P , NPO status , Patient's Chart, lab work & pertinent test results  Airway Mallampati: I TM Distance: >3 FB Neck ROM: Full    Dental  (+) Dental Advisory Given   Pulmonary          Cardiovascular hypertension, Pt. on medications + CAD and + Cardiac Stents     Neuro/Psych    GI/Hepatic GERD-  Medicated and Controlled,  Endo/Other  Hypothyroidism   Renal/GU      Musculoskeletal   Abdominal   Peds  Hematology   Anesthesia Other Findings   Reproductive/Obstetrics                         Anesthesia Physical Anesthesia Plan  ASA: II  Anesthesia Plan: General   Post-op Pain Management: MAC Combined w/ Regional for Post-op pain   Induction: Intravenous  Airway Management Planned: LMA  Additional Equipment:   Intra-op Plan:   Post-operative Plan: Extubation in OR  Informed Consent: I have reviewed the patients History and Physical, chart, labs and discussed the procedure including the risks, benefits and alternatives for the proposed anesthesia with the patient or authorized representative who has indicated his/her understanding and acceptance.   Dental advisory given  Plan Discussed with: CRNA and Surgeon  Anesthesia Plan Comments:        Anesthesia Quick Evaluation

## 2012-01-02 NOTE — Progress Notes (Signed)
Physical Therapy Evaluation Patient Details Name: Laurie Hale MRN: [id number] DOB: 1941-01-25 Today's Date: 01/02/2012 Time: 1191-4782 PT Time Calculation (min): 15 min  PT Assessment / Plan / Recommendation Clinical Impression  Pt is 71 yo female s/p right TKA who tolerated mobility well POD 0. She was able to stand EOB with min A and ther ex was initiated.  Recommend HHPT at d/c, pt already has needed equipment.  PT will follow.    PT Assessment  Patient needs continued PT services    Follow Up Recommendations  Home health PT;Supervision for mobility/OOB    Does the patient have the potential to tolerate intense rehabilitation      Barriers to Discharge None      Equipment Recommendations  None recommended by PT    Recommendations for Other Services     Frequency 7X/week    Precautions / Restrictions Precautions Precautions: Knee Precaution Comments: discussed not putting a pillow directly under the knee, locked out knees of bed Restrictions Weight Bearing Restrictions: Yes RLE Weight Bearing: Weight bearing as tolerated   Pertinent Vitals/Pain 7/10 right knee pain, pain meds requested      Mobility  Bed Mobility Bed Mobility: Supine to Sit;Sitting - Scoot to Edge of Bed Supine to Sit: 4: Min assist Sitting - Scoot to [place] of Bed: 4: Min assist;With rail Details for Bed Mobility Assistance: pt able to get to EOB with right LE supported Transfers Transfers: Sit to Stand;Stand to Sit Sit to Stand: 3: Mod assist;With upper extremity assist;From bed Stand to Sit: 4: Min assist;To bed;With upper extremity assist Details for Transfer Assistance: pt with sufficient strength left LE to push to standing, minimal wt put on right LE Ambulation/Gait Ambulation/Gait Assistance: Not tested (comment) Stairs: No Wheelchair Mobility Wheelchair Mobility: No    Shoulder Instructions     Exercises Total Joint Exercises Ankle Circles/Pumps: AROM;Both;15 reps;Supine Quad  Sets: Other (comment) (family educated)   PT Diagnosis: Difficulty walking;Acute pain  PT Problem List: Decreased strength;Decreased range of motion;Decreased activity tolerance;Decreased mobility;Decreased knowledge of use of DME;Decreased knowledge of precautions;Pain PT Treatment Interventions: DME instruction;Gait training;Stair training;Functional mobility training;Therapeutic activities;Therapeutic exercise;Patient/family education   PT Goals Acute Rehab PT Goals PT Goal Formulation: With patient/family Time For Goal Achievement: 01/09/12 Potential to Achieve Goals: Good Pt will go Supine/Side to Sit: with min assist PT Goal: Supine/Side to Sit - Progress: Goal set today Pt will go Sit to Supine/Side: with min assist PT Goal: Sit to Supine/Side - Progress: Goal set today Pt will go Sit to Stand: with supervision PT Goal: Sit to Stand - Progress: Goal set today Pt will go Stand to Sit: with supervision PT Goal: Stand to Sit - Progress: Goal set today Pt will Ambulate: with rolling walker;with supervision;51 - 150 feet PT Goal: Ambulate - Progress: Goal set today Pt will Go Up / Down Stairs: 1-2 stairs;with rolling walker;with min assist PT Goal: Up/Down Stairs - Progress: Goal set today Pt will Perform Home Exercise Program: with supervision, verbal cues required/provided PT Goal: Perform Home Exercise Program - Progress: Goal set today  Visit Information  Last PT Received On: 01/02/12 Assistance Needed: +1    Subjective Data  Subjective: my eye feels scratchy Patient Stated Goal: return home   Prior Functioning  Home Living Lives With: Spouse Available Help at Discharge: Family;Available 24 hours/day Type of Home: House Home Access: Stairs to enter [employer] of Steps: 1 Entrance Stairs-Rails: None Home Layout: One level Bathroom Shower/Tub: Health visitor:  Handicapped height Bathroom Accessibility: Yes How Accessible: Accessible via  walker Home Adaptive Equipment: Built-in shower seat;Walker - rolling Prior Function Level of Independence: Independent Able to Take Stairs?: Yes Vocation: Retired Musician: No difficulties    Cognition  Overall Cognitive Status: Appears within functional limits for tasks assessed/performed Arousal/Alertness: Awake/alert Orientation Level: Appears intact for tasks assessed Behavior During Session: [facility] for tasks performed    Extremity/Trunk Assessment Right Upper Extremity Assessment RUE ROM/Strength/Tone: [facility] for tasks assessed Left Upper Extremity Assessment LUE ROM/Strength/Tone: WFL for tasks assessed Right Lower Extremity Assessment RLE ROM/Strength/Tone: Deficits RLE ROM/Strength/Tone Deficits: hip flex 3-/5, no active knee extension noted, ankle WFL Left Lower Extremity Assessment LLE ROM/Strength/Tone: WFL for tasks assessed LLE Sensation: WFL - Light Touch;WFL - Proprioception LLE Coordination: WFL - gross motor Trunk Assessment Trunk Assessment: Normal   Balance Balance Balance Assessed: Yes Static Sitting Balance Static Sitting - Balance Support: Feet supported;No upper extremity supported Static Sitting - Level of Assistance: 6: Modified independent (Device/Increase time)  End of Session PT - End of Session Equipment Utilized During Treatment: Oxygen Activity Tolerance: Patient tolerated treatment well Patient left: in bed;with call bell/phone within reach;with family/visitor present Nurse Communication: Mobility status;Patient requests pain meds CPM Right Knee CPM Right Knee: Off  GP   Lyanne Co, PT  Acute Rehab Services  [phone]   Lyanne Co 01/02/2012, 4:52 PM

## 2012-01-02 NOTE — Op Note (Signed)
TOTAL KNEE REPLACEMENT OPERATIVE NOTE:  01/02/2012  12:10 PM  PATIENT:  Laurie Hale  71 y.o. female  PRE-OPERATIVE DIAGNOSIS:  osteoarthritis right knee  POST-OPERATIVE DIAGNOSIS:  osteoarthritis right knee  PROCEDURE:  Procedure(s): TOTAL KNEE ARTHROPLASTY  SURGEON:  Surgeon(s): Dannielle Huh, MD  PHYSICIAN ASSISTANT: Altamese Cabal, [facility]  ANESTHESIA:   general  DRAINS: Hemovac and On-Q Marcaine Pain Pump  SPECIMEN: None  COUNTS:  Correct  TOURNIQUET:   Total Tourniquet Time Documented: Thigh (Right) - 50 minutes  DICTATION:  Indication for procedure:    The patient is a 71 y.o. female who has failed conservative treatment for osteoarthritis right knee.  Informed consent was obtained prior to anesthesia. The risks versus benefits of the operation were explain and in a way the patient can, and did, understand.   Description of procedure:     The patient was taken to the operating room and placed under anesthesia.  The patient was positioned in the usual fashion taking care that all body parts were adequately padded and/or protected.  I foley catheter was placed.  A tourniquet was applied and the leg prepped and draped in the usual sterile fashion.  The extremity was exsanguinated with the esmarch and tourniquet inflated to 350 mmHg.  Pre-operative range of motion was normal.  The knee was in 3 degree of mild valgus.  A midline incision approximately 6-7 inches long was made with a #10 blade.  A new blade was used to make a parapatellar arthrotomy going 2-3 cm into the quadriceps tendon, over the patella, and alongside the medial aspect of the patellar tendon.  A synovectomy was then performed with the #10 blade and forceps. I then elevated the deep MCL off the medial tibial metaphysis subperiosteally around to the semimembranosus attachment.    I everted the patella and used calipers to measure patellar thickness.  I used the reamer to ream down to appropriate thickness to  recreate the native thickness.  I then removed excess bone with the rongeur and sagittal saw.  I used the appropriately sized template and drilled the three lug holes.  I then put the trial in place and measured the thickness with the calipers to ensure recreation of the native thickness.  The trial was then removed and the patella subluxed and the knee brought into flexion.  A homan retractor was place to retract and protect the patella and lateral structures.  A Z-retractor was place medially to protect the medial structures.  The extra-medullary alignment system was used to make cut the tibial articular surface perpendicular to the anamotic axis of the tibia and in 3 degrees of posterior slope.  The cut surface and alignment jig was removed.  I then used the intramedullary alignment guide to make a 4 valgus cut on the distal femur.  I then marked out the epicondylar axis on the distal femur.  The posterior condylar axis measured 3 degrees.  I then used the anterior referencing sizer and measured the femur to be a size D.  The 4-In-1 cutting block was screwed into place in external rotation matching the posterior condylar angle, making our cuts perpendicular to the epicondylar axis.  Anterior, posterior and chamfer cuts were made with the sagittal saw.  The cutting block and cut pieces were removed.  A lamina spreader was placed in 90 degrees of flexion.  The ACL, PCL, menisci, and posterior condylar osteophytes were removed.  A 10 mm spacer blocked was found to offer good flexion and  extension gap balance after minimal in degree releasing.   The scoop retractor was then placed and the femoral finishing block was pinned in place.  The small sagittal saw was used as well as the lug drill to finish the femur.  The block and cut surfaces were removed and the medullary canal hole filled with autograft bone from the cut pieces.  The tibia was delivered forward in deep flexion and external rotation.  A size 3  tray was selected and pinned into place centered on the medial 1/3 of the tibial tubercle.  The reamer and keel was used to prepare the tibia through the tray.    I then trialed with the size D femur, size 3 tibia, a 10 mm insert and the 30 patella.  I had excellent flexion/extension gap balance, excellent patella tracking.  Flexion was full and beyond 120 degrees; extension was zero.  These components were chosen and the staff opened them to me on the back table while the knee was lavaged copiously and the cement mixed.  I cemented in the components and removed all excess cement.  The polyethylene tibial component was snapped into place and the knee placed in extension while cement was hardening.  The capsule was infilltrated with 20cc of .25% Marcaine with epinephrine.  A hemovac was place in the joint exiting superolaterally.  A pain pump was place superomedially superficial to the arthrotomy.  Once the cement was hard, the tourniquet was let down.  Hemostasis was obtained.  The arthrotomy was closed with figure-8 #1 vicryl sutures.  The deep soft tissues were closed with #0 vicryls and the subcuticular layer closed with a running #2-0 vicryl.  The skin was reapproximated and closed with skin staples.  The wound was dressed with xeroform, 4 x4's, 2 ABD sponges, a single layer of webril and a TED stocking.   The patient was then awakened, extubated, and taken to the recovery room in stable condition.  BLOOD LOSS:  300cc DRAINS: 1 hemovac, 1 pain catheter COMPLICATIONS:  None.  PLAN OF CARE: Admit to inpatient   PATIENT DISPOSITION:  PACU - hemodynamically stable.   Delay start of Pharmacological VTE agent (>24hrs) due to surgical blood loss or risk of bleeding:  not applicable  Please fax a copy of this op note to my office at [phone] (please only include page 1 and 2 of the Case Information op note)

## 2012-01-02 NOTE — Anesthesia Postprocedure Evaluation (Signed)
Anesthesia Post Note  Patient: Laurie Hale  Procedure(s) Performed: Procedure(s) (LRB): TOTAL KNEE ARTHROPLASTY (Right)  Anesthesia type: general  Patient location: PACU  Post pain: Pain level controlled  Post assessment: Patient's Cardiovascular Status Stable  Last Vitals:  Filed Vitals:   01/02/12 1045  BP:   Pulse: 90  Temp:   Resp: 16    Post vital signs: Reviewed and stable  Level of consciousness: sedated  Complications: No apparent anesthesia complications

## 2012-01-02 NOTE — H&P (Signed)
Laurie Hale MRN:  [id number] DOB/SEX:  Apr 24, 1940/female  CHIEF COMPLAINT:  Painful right Knee  HISTORY: Patient is a 71 y.o. female presented with a history of pain in the right knee. Onset of symptoms was gradual starting several years ago with gradually worsening course since that time. The patient noted no past surgery on the right knee. Prior procedures on the knee include arthroscopy. Patient has been treated conservatively with over-the-counter NSAIDs and activity modification. Patient currently rates pain in the knee at 9 out of 10 with activity. There is pain at night.  PAST MEDICAL HISTORY: There are no active problems to display for this patient.  Past Medical History  Diagnosis Date  . Hypertension     takes Amlodipine  and Lisinopril daily  . Hyperlipidemia     takes Crestor and Niacin daily  . Coronary artery disease   . History of migraine     hasn't had one in yrs-per pt Zoloft stopped them  . Arthritis   . Joint pain   . Joint swelling   . Chronic back pain   . GERD (gastroesophageal reflux disease)     takes a med daily-to bring with med name at surgery  . Constipation   . Diverticulosis   . Peripheral edema     takes HCTZ daily  . Hypokalemia     takes K Dur tid  . Presence of pessary   . Hypothyroidism     takes Synthroid daily  . Cataract     immature bilateral  . Insomnia     takes restoril prn   Past Surgical History  Procedure Date  . Partial coloectomy 1995  . Back surgery 2009  . Right knee arthroscopy 2013  . Tonsillectomy   . Cardiac catheterization 1999  . Coronary angioplasty     1 stent  . Colonoscopy      MEDICATIONS:   Prescriptions prior to admission  Medication Sig Dispense Refill  . amLODipine (NORVASC) 5 MG tablet Take 5 mg by mouth daily.      . hydrochlorothiazide (HYDRODIURIL) 25 MG tablet Take 25 mg by mouth daily.      Marland Kitchen levothyroxine (SYNTHROID, LEVOTHROID) 50 MCG tablet Take 50 mcg by mouth daily.      Marland Kitchen lisinopril  (PRINIVIL,ZESTRIL) 40 MG tablet Take 40 mg by mouth daily.      . meloxicam (MOBIC) 7.5 MG tablet Take 7.5 mg by mouth daily.      Marland Kitchen morphine (MS CONTIN) 60 MG 12 hr tablet Take 60 mg by mouth every 8 (eight) hours as needed. For pain      . niacin 500 MG tablet Take 500 mg by mouth daily with breakfast.      . potassium chloride (K-DUR) 10 MEQ tablet Take 10 mEq by mouth 3 (three) times daily.      . rosuvastatin (CRESTOR) 5 MG tablet Take 5 mg by mouth daily.      . sertraline (ZOLOFT) 100 MG tablet Take 100 mg by mouth daily.      . temazepam (RESTORIL) 30 MG capsule Take 30 mg by mouth at bedtime as needed. For pain per patient      . aspirin 325 MG tablet Take 325 mg by mouth daily.        ALLERGIES:   Allergies  Allergen Reactions  . Ciprofloxacin   . Penicillins     Hives     REVIEW OF SYSTEMS:  Pertinent items are noted in HPI.  FAMILY HISTORY:  History reviewed. No pertinent family history.  SOCIAL HISTORY:   History  Substance Use Topics  . Smoking status: Never Smoker   . Smokeless tobacco: Not on file  . Alcohol Use: No     EXAMINATION:  Vital signs in last 24 hours: Temp:  [98 F (36.7 C)] 98 F (36.7 C) (11/18 0609) Pulse Rate:  [70] 70  (11/18 0609) Resp:  [18] 18  (11/18 0609) BP: (121)/(74) 121/74 mmHg (11/18 0609) SpO2:  [95 %] 95 % (11/18 0609)  General appearance: alert, cooperative and no distress Lungs: clear to auscultation bilaterally Heart: regular rate and rhythm, S1, S2 normal, no murmur, click, rub or gallop Abdomen: soft, non-tender; bowel sounds normal; no masses,  no organomegaly Extremities: extremities normal, atraumatic, no cyanosis or edema and Homans sign is negative, no sign of DVT Pulses: 2+ and symmetric Skin: Skin color, texture, turgor normal. No rashes or lesions Neurologic: Alert and oriented X 3, normal strength and tone. Normal symmetric reflexes. Normal coordination and gait  Musculoskeletal:  ROM 0-115, Ligaments  intact,  Imaging Review Plain radiographs demonstrate severe degenerative joint disease of the right knee. The overall alignment is mild varus. The bone quality appears to be good for age and reported activity level.  Assessment/Plan: End stage arthritis, right knee   The patient history, physical examination and imaging studies are consistent with advanced degenerative joint disease of the right knee. The patient has failed conservative treatment.  The clearance notes were reviewed.  After discussion with the patient it was felt that Total Knee Replacement was indicated. The procedure,  risks, and benefits of total knee arthroplasty were presented and reviewed. The risks including but not limited to aseptic loosening, infection, blood clots, vascular injury, stiffness, patella tracking problems complications among others were discussed. The patient acknowledged the explanation, agreed to proceed with the plan.  Carlina Derks 01/02/2012, 7:20 AM

## 2012-01-02 NOTE — Plan of Care (Signed)
Problem: Consults Goal: Diagnosis- Total Joint Replacement Primary Total Knee Right     

## 2012-01-03 ENCOUNTER — Encounter (HOSPITAL_COMMUNITY): Payer: Self-pay | Admitting: Orthopedic Surgery

## 2012-01-03 LAB — CBC
HCT: 32 % — ABNORMAL LOW (ref 36.0–46.0)
Platelets: 167 10*3/uL (ref 150–400)
RDW: 12.5 % (ref 11.5–15.5)
WBC: 8.2 10*3/uL (ref 4.0–10.5)

## 2012-01-03 LAB — BASIC METABOLIC PANEL
Chloride: 105 mEq/L (ref 96–112)
GFR calc Af Amer: >90 mL/min (ref >90)
Potassium: 3.1 mEq/L — ABNORMAL LOW (ref 3.5–5.1)

## 2012-01-03 NOTE — Progress Notes (Signed)
Physical Therapy Treatment Patient Details Name: Laurie Hale MRN: [id number] DOB: 1940/02/15 Today's Date: 01/03/2012 Time: 1093-2355 PT Time Calculation (min): 24 min  PT Assessment / Plan / Recommendation Comments on Treatment Session  Pt admitted s/p right TKA and continues to progress with therapy. Pt able to tolerate ambulation this am as well as therapeutic exercises.    Follow Up Recommendations  Home health PT;Supervision for mobility/OOB     Does the patient have the potential to tolerate intense rehabilitation     Barriers to Discharge        Equipment Recommendations  None recommended by PT    Recommendations for Other Services    Frequency 7X/week   Plan Discharge plan remains appropriate;Frequency remains appropriate    Precautions / Restrictions Precautions Precautions: Knee Precaution Booklet Issued: No Restrictions Weight Bearing Restrictions: Yes RLE Weight Bearing: Weight bearing as tolerated   Pertinent Vitals/Pain 5/10 in right knee. Pt repositioned.    Mobility  Bed Mobility Bed Mobility: Not assessed Transfers Transfers: Sit to Stand;Stand to Sit (2 trials.) Sit to Stand: 4: Min assist;With upper extremity assist;From chair/3-in-1 Stand to Sit: 4: Min assist;With upper extremity assist;To chair/3-in-1 Details for Transfer Assistance: Assist for balance with cues for safest hand/right LE placement. Ambulation/Gait Ambulation/Gait Assistance: 4: Min assist Ambulation Distance (Feet): 30 Feet (10 feet and 20 feet.) Assistive device: Rolling walker Ambulation/Gait Assistance Details: Assist for balance and to off weight right LE due to weakness/pain. Cues for safest sequence and distance to advance RW. Gait Pattern: Step-to pattern;Decreased step length - right;Decreased stance time - right;Trunk flexed Stairs: No Wheelchair Mobility Wheelchair Mobility: No    Exercises Total Joint Exercises Ankle Circles/Pumps: AROM;Right;10 reps;Seated Quad  Sets: AROM;Right;10 reps;Supine Heel Slides: AAROM;Right;10 reps;Supine Long Arc Quad: AAROM;Right;10 reps;Seated Goniometric ROM: AA/ROM right knee 5-65 degrees.   PT Diagnosis:    PT Problem List:   PT Treatment Interventions:     PT Goals Acute Rehab PT Goals PT Goal Formulation: With patient/family Time For Goal Achievement: 01/09/12 Potential to Achieve Goals: Good PT Goal: Sit to Stand - Progress: Progressing toward goal PT Goal: Stand to Sit - Progress: Progressing toward goal PT Goal: Ambulate - Progress: Progressing toward goal PT Goal: Perform Home Exercise Program - Progress: Progressing toward goal  Visit Information  Last PT Received On: 01/03/12 Assistance Needed: +1    Subjective Data  Subjective: "My calf has been cramping." Patient Stated Goal: return home   Cognition  Overall Cognitive Status: Appears within functional limits for tasks assessed/performed Arousal/Alertness: Awake/alert Orientation Level: Appears intact for tasks assessed Behavior During Session: [facility] for tasks performed    Balance  Balance Balance Assessed: No  End of Session PT - End of Session Equipment Utilized During Treatment: Gait belt Activity Tolerance: Patient tolerated treatment well Patient left: in chair;with call bell/phone within reach Nurse Communication: Mobility status   GP     Cephus Shelling 01/03/2012, 9:06 AM  01/03/2012 Cephus Shelling, PT, DPT [phone]

## 2012-01-03 NOTE — Progress Notes (Signed)
Referral received for SNF. Chart reviewed and CSW has spoken with RNCM who indicates that patient is for DC to home with Home Health and DME.  CSW to sign off. Please re-consult if CSW needs arise.  [name] T. Alithia Zavaleta, LCSWA  [phone]  

## 2012-01-03 NOTE — Progress Notes (Signed)
SPORTS MEDICINE AND JOINT REPLACEMENT  Georgena Spurling, MD   Altamese Cabal, PA-C [address] [place], [place], [place]  [address]                             [phone]   PROGRESS NOTE  Subjective:  negative for Chest Pain  negative for Shortness of Breath  negative for Nausea/Vomiting   negative for Calf Pain  negative for Bowel Movement   Tolerating Diet: yes         Patient reports pain as 6 on 0-10 scale.    Objective: Vital signs in last 24 hours:   Patient Vitals for the past 24 hrs:  BP Temp Temp src Pulse Resp SpO2  01/03/12 1200 - - - - 18  -  01/03/12 0800 - - - - 18  -  01/03/12 0511 180/89 mmHg 98.4 F (36.9 C) - 91  18  95 %  01/03/12 0400 - - - - 18  95 %  01/03/12 0204 174/82 mmHg 98.9 F (37.2 C) - 87  18  94 %  01/03/12 0000 - - - - 12  94 %  01/02/12 2053 157/70 mmHg 99.1 F (37.3 C) Oral 76  18  95 %    @flow {1959:LAST@   Intake/Output from previous day:   11/18 0701 - 11/19 0700 In: 2682.5 [P.O.:120; I.V.:2262.5] Out: 2975 [Urine:2650; Drains:325]   Intake/Output this shift:   11/19 0701 - 11/19 1900 In: -  Out: 400 [Urine:400]   Intake/Output      11/18 0701 - 11/19 0700 11/19 0701 - 11/20 0700   P.O. 120    I.V. 2262.5    IV Piggyback 300    Total Intake 2682.5    Urine 2650 400   Drains 325    Total Output 2975 400   Net -292.5 -400        Urine Occurrence 50 x       LABORATORY DATA:  Basename 01/03/12 0545 12/28/11 1455  WBC 8.2 6.3  HGB 10.9* 13.4  HCT 32.0* 40.4  PLT 167 206    Basename 01/03/12 0545 12/28/11 1455  NA 143 143  K 3.1* 3.8  CL 105 102  CO2 28 32  BUN 9 24*  CREATININE 0.74 1.04  GLUCOSE 137* 105*  CALCIUM 8.8 9.8   Lab Results  Component Value Date   INR 1.00 12/28/2011   INR 1.1 11/07/2006   INR 1.1 08/29/2006    Examination:  General appearance: alert, cooperative and no distress Extremities: Homans sign is negative, no sign of DVT  Wound Exam: clean, dry, intact   Drainage:   Scant/small amount Serosanguinous exudate  Motor Exam: EHL and FHL Intact  Sensory Exam: Deep Peroneal normal  Vascular Exam:    Assessment:    1 Day Post-Op  Procedure(s) (LRB): TOTAL KNEE ARTHROPLASTY (Right)  ADDITIONAL DIAGNOSIS:  Active Problems:  * No active hospital problems. *   Acute Blood Loss Anemia   Plan: Physical Therapy as ordered Weight Bearing as Tolerated (WBAT)  DVT Prophylaxis:  Lovenox  DISCHARGE PLAN: Home  DISCHARGE NEEDS: HHPT, CPM, Walker and 3-in-1 comode seat         Endrit Gittins 01/03/2012, 5:17 PM

## 2012-01-03 NOTE — Progress Notes (Signed)
CARE MANAGEMENT NOTE 01/03/2012  Patient:  Laurie Hale, Laurie Hale   Account Number:  [id number]  Date Initiated:  01/02/2012  Documentation initiated by:  Vance Peper  Subjective/Objective Assessment:   71 yr old female s/p right total knee arthroplasty.     Action/Plan:   Patient preoperatively setup with [facility], no changes. Choice offered.   Anticipated DC Date:  01/04/2012   Anticipated DC Plan:  HOME W HOME HEALTH SERVICES      DC Planning Services  CM consult      [facility] Choice  HOME HEALTH   Choice offered to / List presented to:  C-1 Patient        HH arranged  HH-2 PT      [facility] agency  [facility]   Status of service:  In process, will continue to follow Medicare Important Message given?   (If response is "NO", the following Medicare IM given date fields will be blank) Date Medicare IM given:   Date Additional Medicare IM given:    Discharge Disposition:  HOME W HOME HEALTH SERVICES  Per UR Regulation:    If discussed at Long Length of Stay Meetings, dates discussed:    Comments:

## 2012-01-03 NOTE — Progress Notes (Signed)
Physical Therapy Treatment Patient Details Name: Laurie Hale MRN: [id number] DOB: 01/25/1941 Today's Date: 01/03/2012 Time: 0865-7846 PT Time Calculation (min): 16 min  PT Assessment / Plan / Recommendation Comments on Treatment Session  Pt admitted s/p right TKA and is very fatigued this pm. Declines OOB with treatment stating, "I've been up all day!" Agreeable to bilateral LE therapeutic exercises.    Follow Up Recommendations  Home health PT;Supervision for mobility/OOB     Does the patient have the potential to tolerate intense rehabilitation     Barriers to Discharge        Equipment Recommendations  None recommended by PT    Recommendations for Other Services    Frequency 7X/week   Plan Discharge plan remains appropriate;Frequency remains appropriate    Precautions / Restrictions Precautions Precautions: Knee Precaution Booklet Issued: No Restrictions Weight Bearing Restrictions: Yes RLE Weight Bearing: Weight bearing as tolerated   Pertinent Vitals/Pain 6/10 in right knee. Pt repositioned.    Mobility  Bed Mobility Bed Mobility: Not assessed Transfers Transfers: Not assessed Ambulation/Gait Ambulation/Gait Assistance: Not tested (comment) Stairs: No Wheelchair Mobility Wheelchair Mobility: No    Exercises Total Joint Exercises Ankle Circles/Pumps: AROM;Right;10 reps;Supine Quad Sets: AROM;Right;10 reps;Supine Short Arc Quad: AAROM;Right;10 reps;Supine Heel Slides: AAROM;Right;10 reps;Supine Hip ABduction/ADduction: AAROM;Right;10 reps;Supine Straight Leg Raises: AAROM;Right;10 reps;Supine   PT Diagnosis:    PT Problem List:   PT Treatment Interventions:     PT Goals Acute Rehab PT Goals PT Goal Formulation: With patient/family Time For Goal Achievement: 01/09/12 Potential to Achieve Goals: Good PT Goal: Perform Home Exercise Program - Progress: Progressing toward goal  Visit Information  Last PT Received On: 01/03/12 Assistance Needed: +1      Subjective Data  Subjective: "I am just so tired. I've been up all day!" Patient Stated Goal: return home   Cognition  Overall Cognitive Status: Appears within functional limits for tasks assessed/performed Arousal/Alertness: Awake/alert Orientation Level: Appears intact for tasks assessed Behavior During Session: [facility] for tasks performed    Balance  Balance Balance Assessed: No  End of Session PT - End of Session Activity Tolerance: Patient tolerated treatment well;Patient limited by fatigue Patient left: in bed;in CPM;with call bell/phone within reach (CPM 0-70 degrees.) Nurse Communication: Mobility status   GP     Cephus Shelling 01/03/2012, 1:31 PM  01/03/2012 Cephus Shelling, PT, DPT [phone]

## 2012-01-03 NOTE — Progress Notes (Signed)
Occupational Therapy Evaluation Patient Details Name: Laurie Hale MRN: [id number] DOB: 1940-11-11 Today's Date: 01/03/2012 Time: 0865-7846 OT Time Calculation (min): 24 min  OT Assessment / Plan / Recommendation Clinical Impression  71 yo s/p R TKA. Pt will benefit from skilled OT services to max independence with ADL and functinoal mobility to facilitate D/C home with 24.7 assist of husband. Pt does not have accessible bathroom and will need BSC for D/C home.     OT Assessment  Patient needs continued OT Services    Follow Up Recommendations  No OT follow up    Barriers to Discharge None    Equipment Recommendations  3 in 1 bedside comode    Recommendations for Other Services    Frequency  Min 2X/week    Precautions / Restrictions Precautions Precautions: Knee Precaution Booklet Issued: No Precaution Comments: positioned in block at end of session. Educated pt on reasoning for block. Restrictions Weight Bearing Restrictions: Yes RLE Weight Bearing: Weight bearing as tolerated   Pertinent Vitals/Pain C/o R knee pain. nsg notified    ADL  Grooming: Set up Where Assessed - Grooming: Unsupported sitting Upper Body Bathing: Supervision/safety;Set up Where Assessed - Upper Body Bathing: Unsupported sitting Lower Body Bathing: Minimal assistance Where Assessed - Lower Body Bathing: Unsupported sit to stand Upper Body Dressing: Set up Where Assessed - Upper Body Dressing: Unsupported sitting Lower Body Dressing: Moderate assistance Where Assessed - Lower Body Dressing: Supported sit to stand Toilet Transfer: Hydrographic surveyor Method: Sit to Barista: Materials engineer and Hygiene: Supervision/safety Where Assessed - Engineer, mining and Hygiene: Standing Equipment Used: Gait belt;Rolling walker Transfers/Ambulation Related to ADLs: Minguard ADL Comments: limited by pain and limited ROM    OT  Diagnosis: Generalized weakness;Acute pain  OT Problem List: Decreased strength;Decreased range of motion;Decreased activity tolerance;Decreased knowledge of use of DME or AE;Pain OT Treatment Interventions: Self-care/ADL training;DME and/or AE instruction;Therapeutic activities;Patient/family education   OT Goals Acute Rehab OT Goals OT Goal Formulation: With patient Time For Goal Achievement: 01/17/12 Potential to Achieve Goals: Good ADL Goals Pt Will Transfer to Toilet: with supervision;with caregiver independent in assisting;3-in-1 ADL Goal: Toilet Transfer - Progress: Goal set today Pt Will Perform Tub/Shower Transfer: Shower transfer;with set-up;with caregiver independent in assisting;Anterior-posterior transfer;with DME ADL Goal: Tub/Shower Transfer - Progress: Goal set today  Visit Information  Last OT Received On: 01/03/12 Assistance Needed: +1    Subjective Data      Prior Functioning     Home Living Lives With: Spouse Available Help at Discharge: Family;Available 24 hours/day Type of Home: House Home Access: Stairs to enter [employer] of Steps: 1 Entrance Stairs-Rails: None Home Layout: One level Bathroom Shower/Tub: Health visitor: Handicapped height Bathroom Accessibility: No Home Adaptive Equipment: Built-in shower seat;Walker - rolling (toilet riser) Prior Function Level of Independence: Independent Able to Take Stairs?: Yes Vocation: Retired Musician: No difficulties Dominant Hand: Right         Vision/Perception     Cognition  Overall Cognitive Status: Appears within functional limits for tasks assessed/performed Arousal/Alertness: Awake/alert Orientation Level: Appears intact for tasks assessed Behavior During Session: [facility] for tasks performed    Extremity/Trunk Assessment Right Upper Extremity Assessment RUE ROM/Strength/Tone: [facility] for tasks assessed Left Upper Extremity Assessment LUE  ROM/Strength/Tone: WFL for tasks assessed Right Lower Extremity Assessment RLE ROM/Strength/Tone: Deficits;Due to pain;Unable to fully assess Left Lower Extremity Assessment LLE ROM/Strength/Tone: [facility] for tasks assessed Trunk Assessment Trunk Assessment:  Normal     Mobility Bed Mobility Bed Mobility: Sit to Supine Supine to Sit: 5: Supervision;HOB flat;With rails Transfers Transfers: Sit to Stand;Stand to Sit Sit to Stand: 4: Min guard;With upper extremity assist;From bed Stand to Sit: 4: Min guard;With upper extremity assist;To toilet Details for Transfer Assistance: vc for hand placemetn     Shoulder Instructions        Balance Balance Balance Assessed: No   End of Session OT - End of Session Equipment Utilized During Treatment: Gait belt Activity Tolerance: Patient tolerated treatment well Patient left: in bed;with call bell/phone within reach Nurse Communication: Mobility status;Patient requests pain meds  GO     Mercer Peifer,HILLARY 01/03/2012, 4:06 PM [facility], OTR/L  [phone] 01/03/2012

## 2012-01-04 LAB — CBC
HCT: 30.4 % — ABNORMAL LOW (ref 36.0–46.0)
Hemoglobin: 10.2 g/dL — ABNORMAL LOW (ref 12.0–15.0)
WBC: 8.7 10*3/uL (ref 4.0–10.5)

## 2012-01-04 LAB — BASIC METABOLIC PANEL
BUN: 8 mg/dL (ref 6–23)
Chloride: 101 mEq/L (ref 96–112)
Glucose, Bld: 121 mg/dL — ABNORMAL HIGH (ref 70–99)
Potassium: 2.9 mEq/L — ABNORMAL LOW (ref 3.5–5.1)

## 2012-01-04 MED ORDER — ENOXAPARIN SODIUM 40 MG/0.4ML ~~LOC~~ SOLN: 40.0000 mg | Freq: Every day | SUBCUTANEOUS | Status: DC

## 2012-01-04 NOTE — Progress Notes (Signed)
SPORTS MEDICINE AND JOINT REPLACEMENT  Georgena Spurling, MD   Altamese Cabal, PA-C [address] [place], [place], [place]  [address]                             [phone]   PROGRESS NOTE  Subjective:  negative for Chest Pain  negative for Shortness of Breath  negative for Nausea/Vomiting   negative for Calf Pain  negative for Bowel Movement   Tolerating Diet: yes         Patient reports pain as 5 on 0-10 scale.    Objective: Vital signs in last 24 hours:   Patient Vitals for the past 24 hrs:  BP Temp Temp src Pulse Resp SpO2  01/04/12 0552 165/81 mmHg 98.1 F (36.7 C) - 82  18  94 %  01/03/12 2125 171/82 mmHg 98.2 F (36.8 C) - 94  18  94 %  01/03/12 1600 - - - - 18  -  01/03/12 1400 166/81 mmHg 98 F (36.7 C) Oral 87  20  98 %    @flow {1959:LAST@   Intake/Output from previous day:   11/19 0701 - 11/20 0700 In: 270 [P.O.:270] Out: 400 [Urine:400]   Intake/Output this shift:       Intake/Output      11/19 0701 - 11/20 0700 11/20 0701 - 11/21 0700   P.O. 270    I.V.     IV Piggyback     Total Intake 270    Urine 400    Drains     Total Output 400    Net -130         Urine Occurrence 5 x       LABORATORY DATA:  Basename 01/04/12 0435 01/03/12 0545 12/28/11 1455  WBC 8.7 8.2 6.3  HGB 10.2* 10.9* 13.4  HCT 30.4* 32.0* 40.4  PLT 154 167 206    Basename 01/04/12 0435 01/03/12 0545 12/28/11 1455  NA 141 143 143  K 2.9* 3.1* 3.8  CL 101 105 102  CO2 29 28 32  BUN 8 9 24*  CREATININE 0.78 0.74 1.04  GLUCOSE 121* 137* 105*  CALCIUM 8.8 8.8 9.8   Lab Results  Component Value Date   INR 1.00 12/28/2011   INR 1.1 11/07/2006   INR 1.1 08/29/2006    Examination:  General appearance: alert, cooperative and no distress Extremities: Homans sign is negative, no sign of DVT  Wound Exam: clean, dry, intact   Drainage:  None: wound tissue dry  Motor Exam: EHL and FHL Intact  Sensory Exam: Deep Peroneal normal  Vascular Exam:    Assessment:    2  Days Post-Op  Procedure(s) (LRB): TOTAL KNEE ARTHROPLASTY (Right)  ADDITIONAL DIAGNOSIS:  Active Problems:  * No active hospital problems. *   Acute Blood Loss Anemia   Plan: Physical Therapy as ordered Weight Bearing as Tolerated (WBAT)  DVT Prophylaxis:  Lovenox  DISCHARGE PLAN: Home  DISCHARGE NEEDS: HHPT, CPM, Walker and 3-in-1 comode seat         Shailee Foots 01/04/2012, 12:51 PM

## 2012-01-04 NOTE — Progress Notes (Signed)
PT Treatment Note:   01/04/12 1139  PT Visit Information  Last PT Received On 01/04/12  Assistance Needed +1  PT Time Calculation  PT Start Time 1127  PT Stop Time 1139  PT Time Calculation (min) 12 min  Subjective Data  Subjective "I am getting anxious to go home."  Patient Stated Goal return home  Precautions  Precautions Knee  Precaution Booklet Issued No  Restrictions  Weight Bearing Restrictions Yes  RLE Weight Bearing WBAT  Cognition  Overall Cognitive Status Appears within functional limits for tasks assessed/performed  Arousal/Alertness Awake/alert  Orientation Level Appears intact for tasks assessed  Behavior During Session [facility] for tasks performed  Bed Mobility  Bed Mobility Not assessed  Transfers  Transfers Not assessed  Ambulation/Gait  Ambulation/Gait Assistance Not tested (comment)  Stairs No  Wheelchair Mobility  Wheelchair Mobility No  Balance  Balance Assessed No  Exercises  Exercises Total Joint  Total Joint Exercises  Ankle Circles/Pumps AROM;Right;10 reps;Supine  Quad Sets AROM;Right;10 reps;Supine  Short Arc Quad AAROM;Right;10 reps;Supine  Heel Slides AAROM;Right;10 reps;Supine  Hip ABduction/ADduction AAROM;Right;10 reps;Supine  Straight Leg Raises AAROM;Right;10 reps;Supine  PT - End of Session  Activity Tolerance Patient tolerated treatment well  Patient left in bed;in CPM;with call bell/phone within reach (CPM 0-75 degrees.)  Nurse Communication Mobility status  PT - Assessment/Plan  Comments on Treatment Session Pt admitted s/p right TKA and is motivated to progress. Pt able to tolerate therapeutic exercises in preparation for d/c home. Ready for safe d/c home once medically cleared by MD.  PT Plan Discharge plan remains appropriate;Frequency remains appropriate  PT Frequency 7X/week  Follow Up Recommendations Home health PT;Supervision for mobility/OOB  Equipment Recommended 3 in 1 bedside comode  Acute Rehab PT Goals  PT Goal  Formulation With patient/family  Time For Goal Achievement 01/09/12  Potential to Achieve Goals Good  PT Goal: Perform Home Exercise Program - Progress Progressing toward goal  PT General Charges  $$ ACUTE PT VISIT 1 Procedure  PT Treatments  $Therapeutic Exercise 8-22 mins    Pain:  None  01/04/2012 Cephus Shelling, PT, DPT [phone]

## 2012-01-04 NOTE — Discharge Summary (Signed)
Georgena Spurling, MD   Altamese Cabal, PA-C [address] [place], [place], [place]  [address]                             [phone]  PATIENT ID: Laurie Hale        MRN:  [id number]          DOB/AGE: 07-23-1940 / 71 y.o.    DISCHARGE SUMMARY  ADMISSION DATE:    01/02/2012 DISCHARGE DATE:   01/04/2012   ADMISSION DIAGNOSIS: osteoarthritis right knee    DISCHARGE DIAGNOSIS:  osteoarthritis right knee    ADDITIONAL DIAGNOSIS: Active Problems:  * No active hospital problems. *   Past Medical History  Diagnosis Date  . Hypertension     takes Amlodipine  and Lisinopril daily  . Hyperlipidemia     takes Crestor and Niacin daily  . Coronary artery disease   . History of migraine     hasn't had one in yrs-per pt Zoloft stopped them  . Arthritis   . Joint pain   . Joint swelling   . Chronic back pain   . GERD (gastroesophageal reflux disease)     takes a med daily-to bring with med name at surgery  . Constipation   . Diverticulosis   . Peripheral edema     takes HCTZ daily  . Hypokalemia     takes K Dur tid  . Presence of pessary   . Hypothyroidism     takes Synthroid daily  . Cataract     immature bilateral  . Insomnia     takes restoril prn    PROCEDURE: Procedure(s): TOTAL KNEE ARTHROPLASTY on 01/02/2012  CONSULTS:     HISTORY:  See H&P in chart  HOSPITAL COURSE:  Laurie Hale is a 71 y.o. admitted on 01/02/2012 and found to have a diagnosis of osteoarthritis right knee.  After appropriate laboratory studies were obtained  they were taken to the operating room on 01/02/2012 and underwent Procedure(s): TOTAL KNEE ARTHROPLASTY.   They were given perioperative antibiotics:  Anti-infectives     Start     Dose/Rate Route Frequency Ordered Stop   01/02/12 1900   vancomycin (VANCOCIN) IVPB 1000 mg/200 mL premix        1,000 mg 200 mL/hr over 60 Minutes Intravenous Every 12 hours 01/02/12 1205 01/02/12 1914   01/02/12 0632   vancomycin (VANCOCIN) 1 GM/200ML IVPB       Comments: WILHELM, JENNIFER: cabinet override         01/02/12 0632 01/02/12 0720   01/01/12 1530   ceFAZolin (ANCEF) IVPB 2 g/50 mL premix  Status:  Discontinued        2 g 100 mL/hr over 30 Minutes Intravenous 60 min pre-op 01/01/12 1530 01/02/12 0627        .  Tolerated the procedure well.  Placed with a foley intraoperatively.  Given Ofirmev at induction and for 48 hours.    POD #1, allowed out of bed to a chair.  PT for ambulation and exercise program.  Foley D/C'd in morning.  IV saline locked.  O2 discontionued.  POD #2, continued PT and ambulation.   Hemovac pulled. .  The remainder of the hospital course was dedicated to ambulation and strengthening.   The patient was discharged on 2 Days Post-Op in  Stable condition.  Blood products given:none  DIAGNOSTIC STUDIES: Recent vital signs: Patient Vitals for the past 24  hrs:  BP Temp Temp src Pulse Resp SpO2  01/04/12 0552 165/81 mmHg 98.1 F (36.7 C) - 82  18  94 %  January 28, 2012 2125 171/82 mmHg 98.2 F (36.8 C) - 94  18  94 %  28-Jan-2012 1600 - - - - 18  -  01-28-12 1400 166/81 mmHg 98 F (36.7 C) Oral 87  20  98 %       Recent laboratory studies:  Basename 01/04/12 0435 January 28, 2012 0545 12/28/11 1455  WBC 8.7 8.2 6.3  HGB 10.2* 10.9* 13.4  HCT 30.4* 32.0* 40.4  PLT 154 167 206    Basename 01/04/12 0435 Jan 28, 2012 0545 12/28/11 1455  NA 141 143 143  K 2.9* 3.1* 3.8  CL 101 105 102  CO2 29 28 32  BUN 8 9 24*  CREATININE 0.78 0.74 1.04  GLUCOSE 121* 137* 105*  CALCIUM 8.8 8.8 9.8   Lab Results  Component Value Date   INR 1.00 12/28/2011   INR 1.1 11/07/2006   INR 1.1 08/29/2006     Recent Radiographic Studies :  Dg Chest 2 View  01/02/2012  *RADIOLOGY REPORT*  Clinical Data: Preop.  CHEST - 2 VIEW  Comparison: 11/11/2006.  Findings: Trachea is midline.  Heart size normal.  Thoracic aorta is calcified.  Minimal scarring in the medial right lung base. Lungs are otherwise clear.  No pleural fluid.  Spinal  hardware is noted.  IMPRESSION: No acute findings.   Original Report Authenticated By: Leanna Battles, M.D.     DISCHARGE INSTRUCTIONS: Discharge Orders    Future Orders Please Complete By Expires   Diet - low sodium heart healthy      Call MD / Call 911      Comments:   If you experience chest pain or shortness of breath, CALL 911 and be transported to the hospital emergency room.  If you develope a fever above 101 F, pus (white drainage) or increased drainage or redness at the wound, or calf pain, call your surgeon's office.   Constipation Prevention      Comments:   Drink plenty of fluids.  Prune juice may be helpful.  You may use a stool softener, such as Colace (over the counter) 100 mg twice a day.  Use MiraLax (over the counter) for constipation as needed.   Increase activity slowly as tolerated      Driving restrictions      Comments:   No driving for 6 weeks   Lifting restrictions      Comments:   No lifting for 6 weeks   Do not put a pillow under the knee. Place it under the heel.      Change dressing      Comments:   Change dressing on thursday, then change the dressing daily with sterile 4 x 4 inch gauze dressing and apply TED hose.  You may clean the incision with alcohol prior to redressing.   TED hose      Comments:   Use stockings (TED hose) for 3 weeks on both leg(s).  You may remove them at night for sleeping.   CPM      Comments:   Continuous passive motion machine (CPM):      Use the CPM from 0 to 90 for 6-8 hours per day.      You may increase by 10 per day.  You may break it up into 2 or 3 sessions per day.      Use CPM for  2 weeks or until you are told to stop.      DISCHARGE MEDICATIONS:     Medication List     As of 01/04/2012 12:58 PM    STOP taking these medications         aspirin 325 MG tablet      TAKE these medications         amLODipine 5 MG tablet   Commonly known as: NORVASC   Take 5 mg by mouth daily.      enoxaparin 40 MG/0.4ML  injection   Commonly known as: LOVENOX   Inject 0.4 mLs (40 mg total) into the skin daily.      hydrochlorothiazide 25 MG tablet   Commonly known as: HYDRODIURIL   Take 25 mg by mouth daily.      levothyroxine 50 MCG tablet   Commonly known as: SYNTHROID, LEVOTHROID   Take 50 mcg by mouth daily.      lisinopril 40 MG tablet   Commonly known as: PRINIVIL,ZESTRIL   Take 40 mg by mouth daily.      meloxicam 7.5 MG tablet   Commonly known as: MOBIC   Take 7.5 mg by mouth daily.      morphine 60 MG 12 hr tablet   Commonly known as: MS CONTIN   Take 60 mg by mouth every 8 (eight) hours as needed. For pain      niacin 500 MG tablet   Take 500 mg by mouth daily with breakfast.      potassium chloride 10 MEQ tablet   Commonly known as: K-DUR   Take 10 mEq by mouth 3 (three) times daily.      rosuvastatin 5 MG tablet   Commonly known as: CRESTOR   Take 5 mg by mouth daily.      sertraline 100 MG tablet   Commonly known as: ZOLOFT   Take 100 mg by mouth daily.      temazepam 30 MG capsule   Commonly known as: RESTORIL   Take 30 mg by mouth at bedtime as needed. For pain per patient        FOLLOW UP VISIT:       Follow-up Information    Follow up with Raymon Mutton, MD. Call on 01/17/2012.   Contact information:   [address] [place] [address] [phone]          DISPOSITION:  home  CONDITION:  {Good  Laurie Hale 01/04/2012, 12:58 PM

## 2012-01-04 NOTE — Progress Notes (Signed)
Pt discharged to home accompanied by husband. Discharge instructions and rx given and explained and patient/family stated understanding. IV was removed. Pt left unit in stable condition via wheelchair in stable condition.

## 2012-01-04 NOTE — Progress Notes (Signed)
Physical Therapy Treatment Patient Details Name: Laurie Hale MRN: [id number] DOB: 10/13/1940 Today's Date: 01/04/2012 Time: 0750-0828 PT Time Calculation (min): 38 min  PT Assessment / Plan / Recommendation Comments on Treatment Session  Pt admitted s/p right TKA and continues to progress. Pt able to tolerate increased ambulation distance as well as stair negotiation. Ready for safe d/c home once medically cleared by MD.    Follow Up Recommendations  Home health PT;Supervision for mobility/OOB     Does the patient have the potential to tolerate intense rehabilitation     Barriers to Discharge        Equipment Recommendations  3 in 1 bedside comode    Recommendations for Other Services    Frequency 7X/week   Plan Discharge plan remains appropriate;Frequency remains appropriate    Precautions / Restrictions Precautions Precautions: Knee Precaution Booklet Issued: No Restrictions Weight Bearing Restrictions: Yes RLE Weight Bearing: Weight bearing as tolerated   Pertinent Vitals/Pain None    Mobility  Bed Mobility Bed Mobility: Not assessed Transfers Transfers: Sit to Stand;Stand to Sit Sit to Stand: 4: Min guard;With upper extremity assist;From chair/3-in-1 Stand to Sit: 4: Min guard;With upper extremity assist;To chair/3-in-1 Details for Transfer Assistance: Guarding for balance with cues for safest hand placement. Ambulation/Gait Ambulation/Gait Assistance: 4: Min guard Ambulation Distance (Feet): 110 Feet Assistive device: Rolling walker Ambulation/Gait Assistance Details: Guarding for balance with cues for tall extended posture as well as sequence inside RW. Gait Pattern: Step-to pattern;Decreased step length - right;Decreased stance time - right;Trunk flexed Stairs: Yes Stairs Assistance: 4: Min guard Stairs Assistance Details (indicate cue type and reason): Guarding for balance with cues for "up with good, down with bad." Stair Management Technique: Step to  pattern;Backwards;With walker Number of Stairs: 1  Wheelchair Mobility Wheelchair Mobility: No    Exercises Total Joint Exercises Ankle Circles/Pumps: AROM;Right;10 reps;Supine Quad Sets: AROM;Right;10 reps;Supine Short Arc Quad: AAROM;Right;10 reps;Supine Heel Slides: AAROM;Right;10 reps;Supine Hip ABduction/ADduction: AAROM;Right;10 reps;Supine Straight Leg Raises: AAROM;Right;10 reps;Supine Goniometric ROM: AA/ROM right knee 5-70 degrees.   PT Diagnosis:    PT Problem List:   PT Treatment Interventions:     PT Goals Acute Rehab PT Goals PT Goal Formulation: With patient/family Time For Goal Achievement: 01/09/12 Potential to Achieve Goals: Good PT Goal: Sit to Stand - Progress: Progressing toward goal PT Goal: Stand to Sit - Progress: Progressing toward goal PT Goal: Ambulate - Progress: Progressing toward goal PT Goal: Up/Down Stairs - Progress: Met PT Goal: Perform Home Exercise Program - Progress: Progressing toward goal  Visit Information  Last PT Received On: 01/04/12 Assistance Needed: +1    Subjective Data  Subjective: "I hope I can go home today." Patient Stated Goal: return home   Cognition  Overall Cognitive Status: Appears within functional limits for tasks assessed/performed Arousal/Alertness: Awake/alert Orientation Level: Appears intact for tasks assessed Behavior During Session: [facility] for tasks performed    Balance  Balance Balance Assessed: No  End of Session PT - End of Session Equipment Utilized During Treatment: Gait belt Activity Tolerance: Patient tolerated treatment well Patient left: in chair;with call bell/phone within reach;with family/visitor present Nurse Communication: Mobility status   GP     Cephus Shelling 01/04/2012, 8:34 AM  01/04/2012 Cephus Shelling, PT, DPT [phone]

## 2012-01-04 NOTE — Progress Notes (Signed)
Occupational Therapy Treatment Patient Details Name: Laurie Hale MRN: [id number] DOB: 27-Sep-1940 Today's Date: 01/04/2012 Time: 4098-1191 OT Time Calculation (min): 16 min  OT Assessment / Plan / Recommendation Comments on Treatment Session All education completed with pt/family. Pt appropriate for D/C home today with 24.7 S of husband.    Follow Up Recommendations  No OT follow up    Barriers to Discharge   none    Equipment Recommendations  3 in 1 bedside comode    Recommendations for Other Services  none  Frequency Min 2X/week   Plan Discharge plan remains appropriate    Precautions / Restrictions Precautions Precautions: Knee Precaution Booklet Issued: No Precaution Comments: Educated pt/husband on importance of keeping knee straight. Posiitoned in extension on foam pieces at end of session. Restrictions Weight Bearing Restrictions: Yes RLE Weight Bearing: Weight bearing as tolerated   Pertinent Vitals/Pain No c/o pain    ADL  ADL Comments: Focus of session on educating husband on ADL and functinal moiblity for ADL. Pt requires vc to remember hand placement during transfers. Has reacher to assist with LB ADL.    OT Diagnosis:    OT Problem List:   OT Treatment Interventions:     OT Goals Acute Rehab OT Goals OT Goal Formulation: With patient Time For Goal Achievement: 01/17/12 Potential to Achieve Goals: Good ADL Goals Pt Will Transfer to Toilet: with supervision;with caregiver independent in assisting;3-in-1 ADL Goal: Toilet Transfer - Progress: Met Pt Will Perform Tub/Shower Transfer: Shower transfer;with set-up;with caregiver independent in assisting;Anterior-posterior transfer;with DME ADL Goal: Tub/Shower Transfer - Progress: Met  Visit Information  Last OT Received On: 01/04/12 Assistance Needed: +1    Subjective Data      Prior Functioning       Cognition  Overall Cognitive Status: Impaired Area of Impairment: Memory Arousal/Alertness:  Awake/alert Orientation Level: Appears intact for tasks assessed Behavior During Session: [facility] for tasks performed Memory: Decreased recall of precautions Memory Deficits: unable to recall hand placement    Mobility  Shoulder Instructions Bed Mobility Bed Mobility: Supine to Sit;Sit to Supine Supine to Sit: 6: Modified independent (Device/Increase time);HOB flat Sitting - Scoot to Edge of Bed: 6: Modified independent (Device/Increase time) Sit to Supine: 6: Modified independent (Device/Increase time) Transfers Transfers: Sit to Stand;Stand to Sit Sit to Stand: 5: Supervision;With upper extremity assist;From bed Stand to Sit: 5: Supervision;With upper extremity assist;To bed Details for Transfer Assistance:  (vc for hand placement)       Exercises     Balance Balance Balance Assessed: No   End of Session OT - End of Session Activity Tolerance: Patient tolerated treatment well Patient left: in bed;with call bell/phone within reach;with family/visitor present Nurse Communication: Other (comment) (ready for D/C )  GO     Jahseh Lucchese,HILLARY 01/04/2012, 11:29 AM Luisa Dago, OTR/L  [phone] 01/04/2012

## 2012-02-15 DIAGNOSIS — C50919 Malignant neoplasm of unspecified site of unspecified female breast: Secondary | ICD-10-CM

## 2012-02-15 DIAGNOSIS — Z923 Personal history of irradiation: Secondary | ICD-10-CM

## 2012-02-15 HISTORY — DX: Malignant neoplasm of unspecified site of unspecified female breast: C50.919

## 2012-02-15 HISTORY — DX: Personal history of irradiation: Z92.3

## 2012-02-15 HISTORY — PX: BREAST LUMPECTOMY: SHX2

## 2012-03-08 ENCOUNTER — Other Ambulatory Visit: Payer: Self-pay | Admitting: Internal Medicine

## 2012-03-08 NOTE — Telephone Encounter (Signed)
meloxicam (MOBIC) 7.5 MG tablet #90

## 2012-03-08 NOTE — Telephone Encounter (Signed)
This patient does not have an appointment with [place]. Faxed script back to pharmacy.

## 2012-09-07 ENCOUNTER — Other Ambulatory Visit: Payer: Self-pay | Admitting: *Deleted

## 2012-09-11 ENCOUNTER — Encounter: Payer: Self-pay | Admitting: Internal Medicine

## 2012-09-11 ENCOUNTER — Ambulatory Visit (INDEPENDENT_AMBULATORY_CARE_PROVIDER_SITE_OTHER): Payer: Medicare HMO | Admitting: Internal Medicine

## 2012-09-11 VITALS — BP 130/70 | HR 57 | Temp 97.8°F | Ht 59.5 in | Wt 161.2 lb

## 2012-09-11 DIAGNOSIS — E876 Hypokalemia: Secondary | ICD-10-CM

## 2012-09-11 DIAGNOSIS — E039 Hypothyroidism, unspecified: Secondary | ICD-10-CM

## 2012-09-11 DIAGNOSIS — M549 Dorsalgia, unspecified: Secondary | ICD-10-CM

## 2012-09-11 DIAGNOSIS — G609 Hereditary and idiopathic neuropathy, unspecified: Secondary | ICD-10-CM

## 2012-09-11 DIAGNOSIS — I10 Essential (primary) hypertension: Secondary | ICD-10-CM

## 2012-09-11 DIAGNOSIS — E7800 Pure hypercholesterolemia, unspecified: Secondary | ICD-10-CM

## 2012-09-11 DIAGNOSIS — I2510 Atherosclerotic heart disease of native coronary artery without angina pectoris: Secondary | ICD-10-CM

## 2012-09-11 DIAGNOSIS — K5730 Diverticulosis of large intestine without perforation or abscess without bleeding: Secondary | ICD-10-CM

## 2012-09-11 DIAGNOSIS — K5790 Diverticulosis of intestine, part unspecified, without perforation or abscess without bleeding: Secondary | ICD-10-CM

## 2012-09-11 DIAGNOSIS — K219 Gastro-esophageal reflux disease without esophagitis: Secondary | ICD-10-CM

## 2012-09-11 DIAGNOSIS — G629 Polyneuropathy, unspecified: Secondary | ICD-10-CM

## 2012-09-11 DIAGNOSIS — G8929 Other chronic pain: Secondary | ICD-10-CM

## 2012-09-11 MED ORDER — LEVOTHYROXINE SODIUM 50 MCG PO TABS: 50.0000 ug | ORAL_TABLET | Freq: Every day | ORAL | Status: DC

## 2012-09-11 MED ORDER — AMLODIPINE BESYLATE 5 MG PO TABS: 5.0000 mg | ORAL_TABLET | Freq: Every day | ORAL | Status: DC

## 2012-09-11 MED ORDER — POTASSIUM CHLORIDE ER 10 MEQ PO TBCR: 10.0000 meq | EXTENDED_RELEASE_TABLET | Freq: Three times a day (TID) | ORAL | Status: DC

## 2012-09-15 ENCOUNTER — Encounter: Payer: Self-pay | Admitting: Internal Medicine

## 2012-09-15 DIAGNOSIS — K219 Gastro-esophageal reflux disease without esophagitis: Secondary | ICD-10-CM | POA: Insufficient documentation

## 2012-09-15 DIAGNOSIS — G8929 Other chronic pain: Secondary | ICD-10-CM | POA: Insufficient documentation

## 2012-09-15 DIAGNOSIS — E039 Hypothyroidism, unspecified: Secondary | ICD-10-CM | POA: Insufficient documentation

## 2012-09-15 DIAGNOSIS — M549 Dorsalgia, unspecified: Secondary | ICD-10-CM | POA: Insufficient documentation

## 2012-09-15 DIAGNOSIS — I10 Essential (primary) hypertension: Secondary | ICD-10-CM | POA: Insufficient documentation

## 2012-09-15 DIAGNOSIS — K5790 Diverticulosis of intestine, part unspecified, without perforation or abscess without bleeding: Secondary | ICD-10-CM | POA: Insufficient documentation

## 2012-09-15 DIAGNOSIS — E876 Hypokalemia: Secondary | ICD-10-CM | POA: Insufficient documentation

## 2012-09-15 DIAGNOSIS — E7800 Pure hypercholesterolemia, unspecified: Secondary | ICD-10-CM | POA: Insufficient documentation

## 2012-09-15 DIAGNOSIS — I2510 Atherosclerotic heart disease of native coronary artery without angina pectoris: Secondary | ICD-10-CM | POA: Insufficient documentation

## 2012-09-15 NOTE — Assessment & Plan Note (Signed)
On Crestor.  Continue low cholesterol diet and exercise.  Check lipid panel and liver function.

## 2012-09-15 NOTE — Assessment & Plan Note (Signed)
Followed by Dr Phillips.  Stable.    

## 2012-09-15 NOTE — Progress Notes (Signed)
Subjective:    Patient ID: Laurie Hale, female    DOB: 05/01/1940, 72 y.o.   MRN: [id number]  HPI 72 year old female with past history of CAD followed by Dr Gwen Pounds, hypercholesterolemia, hypertension, GERD and diverticulosis.  She comes in today to follow up on these issues as well as to transfer her care here to [facility].  Former patient of mine at [employer].  She states that overall she is doing relatively well.  Is s/p TKR in 11/13 (Dr Valentina Gu).  Has done well.  Still doing exercises at home.  Still seeing Dr Vear Clock for her back.  Still with pain, but stable on current medication regimen.  Heart stable.  Just evaluated two months ago by cardiology.  Had a stress test, echo and EKG.  States everything checked out fine.  Eating and drinking well.  Bowels stable.     Past Medical History  Diagnosis Date  . Hypertension     takes Amlodipine  and Lisinopril daily  . Hyperlipidemia     takes Crestor and Niacin daily  . Coronary artery disease   . History of migraine     hasn't had one in yrs-per pt Zoloft stopped them  . Arthritis   . Joint pain   . Joint swelling   . Chronic back pain   . GERD (gastroesophageal reflux disease)     takes a med daily-to bring with med name at surgery  . Constipation   . Diverticulosis   . Peripheral edema     takes HCTZ daily  . Hypokalemia     takes K Dur tid  . Presence of pessary   . Hypothyroidism     takes Synthroid daily  . Cataract     immature bilateral  . Insomnia     takes restoril prn    Outpatient Encounter Prescriptions as of 09/11/2012  Medication Sig Dispense Refill  . enoxaparin (LOVENOX) 40 MG/0.4ML injection Inject 0.4 mLs (40 mg total) into the skin daily.  12 Syringe  0  . gabapentin (NEURONTIN) 300 MG capsule Take 300 mg by mouth daily.      . hydrochlorothiazide (HYDRODIURIL) 25 MG tablet Take 25 mg by mouth daily.      Marland Kitchen levothyroxine (SYNTHROID, LEVOTHROID) 50 MCG tablet Take 1 tablet (50 mcg total) by mouth daily.  90  tablet  1  . lisinopril (PRINIVIL,ZESTRIL) 40 MG tablet Take 40 mg by mouth daily.      . meloxicam (MOBIC) 7.5 MG tablet Take 7.5 mg by mouth daily.      Marland Kitchen morphine (MS CONTIN) 60 MG 12 hr tablet Take 60 mg by mouth every 8 (eight) hours as needed. For pain      . pantoprazole (PROTONIX) 40 MG tablet Take 40 mg by mouth daily.      . potassium chloride (K-DUR) 10 MEQ tablet Take 1 tablet (10 mEq total) by mouth 3 (three) times daily.  270 tablet  1  . rosuvastatin (CRESTOR) 5 MG tablet Take 5 mg by mouth daily.      . sertraline (ZOLOFT) 100 MG tablet Take 100 mg by mouth daily.      . [DISCONTINUED] amLODipine (NORVASC) 5 MG tablet Take 5 mg by mouth daily.      . [DISCONTINUED] levothyroxine (SYNTHROID, LEVOTHROID) 50 MCG tablet Take 50 mcg by mouth daily.      . [DISCONTINUED] potassium chloride (K-DUR) 10 MEQ tablet Take 10 mEq by mouth 3 (three) times daily.      . [  DISCONTINUED] niacin 500 MG tablet Take 500 mg by mouth daily with breakfast.      . [DISCONTINUED] temazepam (RESTORIL) 30 MG capsule Take 30 mg by mouth at bedtime as needed. For pain per patient       No facility-administered encounter medications on file as of 09/11/2012.    Review of Systems Patient denies any headache, lightheadedness or dizziness.  No sinus or allergy symptoms.  No chest pain, tightness or palpitations.  No increased shortness of breath, cough or congestion.  No nausea or vomiting.  No acid reflux.  No abdominal pain or cramping.  No bowel change, such as diarrhea, constipation, BRBPR or melana.  No urine change.  Still with back pain.  Stable.  Knee doing better.       Objective:   Physical Exam Filed Vitals:   09/11/12 1601  BP: 130/70  Pulse: 57  Temp: 97.8 F (36.6 C)   Pulse recheck 20  72 year old female in no acute distress.   HEENT:  Nares- clear.  Oropharynx - without lesions. NECK:  Supple.  Nontender.    HEART:  Appears to be regular. LUNGS:  No crackles or wheezing audible.   Respirations even and unlabored.  RADIAL PULSE:  Equal bilaterally.   ABDOMEN:  Soft, nontender.  Bowel sounds present and normal.  No audible abdominal bruit.    EXTREMITIES:  No increased edema present.  DP pulses palpable and equal bilaterally.          Assessment & Plan:  FATIGUE.  Some fatigue.  Overall ok.  Check cbc and tsh.   HEALTH MAINTENANCE.  Will schedule a physical for next visit.  Need to obtain records to review last colonoscopy and mammogram.

## 2012-09-15 NOTE — Assessment & Plan Note (Signed)
Reflux controlled on Protonix.  Follow.  

## 2012-09-15 NOTE — Assessment & Plan Note (Signed)
Blood pressure doing well.  Same medication regimen.  Check metabolic panel.  

## 2012-09-15 NOTE — Assessment & Plan Note (Signed)
On potassium supplements.  Check potassium.

## 2012-09-15 NOTE — Assessment & Plan Note (Signed)
Bowels stable.  Per report, up to date with colonoscopy.  Need to obtain records.    

## 2012-09-15 NOTE — Assessment & Plan Note (Signed)
Currently stable.  Just evaluated by cardiology.  Had ECHO, stress test and EKG two months ago.  States everything checked out fine.  Continue risk factor modification.  Follow.

## 2012-09-15 NOTE — Assessment & Plan Note (Signed)
On thyroid replacement.  Check tsh.  

## 2012-09-18 ENCOUNTER — Other Ambulatory Visit (INDEPENDENT_AMBULATORY_CARE_PROVIDER_SITE_OTHER): Payer: Medicare HMO

## 2012-09-18 DIAGNOSIS — E7800 Pure hypercholesterolemia, unspecified: Secondary | ICD-10-CM

## 2012-09-18 DIAGNOSIS — G609 Hereditary and idiopathic neuropathy, unspecified: Secondary | ICD-10-CM

## 2012-09-18 DIAGNOSIS — G629 Polyneuropathy, unspecified: Secondary | ICD-10-CM

## 2012-09-18 DIAGNOSIS — I10 Essential (primary) hypertension: Secondary | ICD-10-CM

## 2012-09-18 DIAGNOSIS — E039 Hypothyroidism, unspecified: Secondary | ICD-10-CM

## 2012-09-18 LAB — CBC WITH DIFFERENTIAL/PLATELET
Basophils Relative: 0.3 % (ref 0.0–3.0)
Eosinophils Relative: 4.1 % (ref 0.0–5.0)
HCT: 36.7 % (ref 36.0–46.0)
Hemoglobin: 12.1 g/dL (ref 12.0–15.0)
Lymphs Abs: 1.4 10*3/uL (ref 0.7–4.0)
MCV: 90.6 fl (ref 78.0–100.0)
Monocytes Absolute: 0.4 10*3/uL (ref 0.1–1.0)
Neutro Abs: 2.8 10*3/uL (ref 1.4–7.7)
RBC: 4.05 Mil/uL (ref 3.87–5.11)
WBC: 4.9 10*3/uL (ref 4.5–10.5)

## 2012-09-18 LAB — HEPATIC FUNCTION PANEL
AST: 19 U/L (ref 0–37)
Alkaline Phosphatase: 49 U/L (ref 39–117)
Total Bilirubin: 0.5 mg/dL (ref 0.3–1.2)

## 2012-09-18 LAB — BASIC METABOLIC PANEL
CO2: 30 mEq/L (ref 19–32)
Calcium: 9.3 mg/dL (ref 8.4–10.5)
Chloride: 104 mEq/L (ref 96–112)
Creatinine, Ser: 1.1 mg/dL (ref 0.4–1.2)
Glucose, Bld: 102 mg/dL — ABNORMAL HIGH (ref 70–99)

## 2012-09-18 LAB — LIPID PANEL
Total CHOL/HDL Ratio: 4
VLDL: 46.2 mg/dL — ABNORMAL HIGH (ref 0.0–40.0)

## 2012-09-19 ENCOUNTER — Other Ambulatory Visit: Payer: Self-pay

## 2012-09-19 LAB — LDL CHOLESTEROL, DIRECT: Direct LDL: 94.3 mg/dL

## 2012-09-20 ENCOUNTER — Encounter: Payer: Self-pay | Admitting: Internal Medicine

## 2012-09-20 ENCOUNTER — Telehealth: Payer: Self-pay | Admitting: Internal Medicine

## 2012-09-20 DIAGNOSIS — N289 Disorder of kidney and ureter, unspecified: Secondary | ICD-10-CM

## 2012-09-20 DIAGNOSIS — E039 Hypothyroidism, unspecified: Secondary | ICD-10-CM

## 2012-09-20 MED ORDER — LEVOTHYROXINE SODIUM 75 MCG PO TABS: 75.0000 ug | ORAL_TABLET | Freq: Every day | ORAL | Status: DC

## 2012-09-20 NOTE — Telephone Encounter (Signed)
Pt notified of labs via my chart.  She needs a follow up lab appt in 6 weeks.  Please schedule her for a non fasting lab appt in 6 weeks and call her with an appt date and time.  Thanks.

## 2012-09-21 NOTE — Telephone Encounter (Signed)
Sent my chart message letting pt know her appointment is 9/19

## 2012-10-02 ENCOUNTER — Ambulatory Visit: Payer: Self-pay | Admitting: Internal Medicine

## 2012-10-09 ENCOUNTER — Ambulatory Visit: Payer: Self-pay | Admitting: Internal Medicine

## 2012-10-19 ENCOUNTER — Telehealth: Payer: Self-pay | Admitting: Internal Medicine

## 2012-10-19 NOTE — Telephone Encounter (Signed)
Order signed for stereotactic biopsy breast.

## 2012-10-22 ENCOUNTER — Encounter: Payer: Self-pay | Admitting: Internal Medicine

## 2012-10-22 ENCOUNTER — Ambulatory Visit: Payer: Self-pay | Admitting: Internal Medicine

## 2012-10-22 HISTORY — PX: BREAST BIOPSY: SHX20

## 2012-10-26 ENCOUNTER — Other Ambulatory Visit: Payer: Self-pay | Admitting: Internal Medicine

## 2012-10-26 ENCOUNTER — Telehealth: Payer: Self-pay | Admitting: *Deleted

## 2012-10-26 DIAGNOSIS — R897 Abnormal histological findings in specimens from other organs, systems and tissues: Secondary | ICD-10-CM

## 2012-10-26 NOTE — Telephone Encounter (Signed)
Pt notified of biopsy results and need for surgery referral.  She prefers to see Dr Michela Pitcher.  Order placed for referral.  Biopsy report on your desk.  Please scan into our chart after done.  Thanks.

## 2012-10-26 NOTE — Telephone Encounter (Signed)
Biopsy results done by Dr. Renato Gails on 9/8- left breast  Findings: low grade dcis & lcis  Recommendation: Surgery  Wanted to make sure you were aware of these results & that you are planning to refer her for surgical consultation

## 2012-11-02 ENCOUNTER — Other Ambulatory Visit (INDEPENDENT_AMBULATORY_CARE_PROVIDER_SITE_OTHER): Payer: Medicare HMO

## 2012-11-02 DIAGNOSIS — N289 Disorder of kidney and ureter, unspecified: Secondary | ICD-10-CM

## 2012-11-02 DIAGNOSIS — E039 Hypothyroidism, unspecified: Secondary | ICD-10-CM

## 2012-11-02 LAB — BASIC METABOLIC PANEL
Chloride: 101 mEq/L (ref 96–112)
GFR: 57.59 mL/min — ABNORMAL LOW (ref >60.00)
Glucose, Bld: 104 mg/dL — ABNORMAL HIGH (ref 70–99)
Potassium: 3.6 mEq/L (ref 3.5–5.1)
Sodium: 140 mEq/L (ref 135–145)

## 2012-11-04 ENCOUNTER — Encounter: Payer: Self-pay | Admitting: Internal Medicine

## 2012-11-05 ENCOUNTER — Encounter: Payer: Self-pay | Admitting: Internal Medicine

## 2012-11-08 ENCOUNTER — Encounter: Payer: Self-pay | Admitting: Internal Medicine

## 2012-11-22 ENCOUNTER — Ambulatory Visit: Payer: Self-pay | Admitting: Surgery

## 2012-11-22 LAB — CBC WITH DIFFERENTIAL/PLATELET
Basophil %: 0.6 %
HCT: 37.2 % (ref 35.0–47.0)
HGB: 12.7 g/dL (ref 12.0–16.0)
Lymphocyte #: 1.6 10*3/uL (ref 1.0–3.6)
Lymphocyte %: 24.8 %
MCH: 30.8 pg (ref 26.0–34.0)
MCV: 90 fL (ref 80–100)
Monocyte %: 8.7 %
Neutrophil #: 4.1 10*3/uL (ref 1.4–6.5)
Neutrophil %: 63.6 %
Platelet: 218 10*3/uL (ref 150–440)
RBC: 4.13 10*6/uL (ref 3.80–5.20)

## 2012-11-22 LAB — BASIC METABOLIC PANEL
Chloride: 104 mmol/L (ref 98–107)
Co2: 31 mmol/L (ref 21–32)
Creatinine: 1.17 mg/dL (ref 0.60–1.30)
EGFR (Non-African Amer.): 46 — ABNORMAL LOW
Potassium: 3.8 mmol/L (ref 3.5–5.1)

## 2012-11-23 ENCOUNTER — Ambulatory Visit (INDEPENDENT_AMBULATORY_CARE_PROVIDER_SITE_OTHER): Payer: Medicare HMO | Admitting: Internal Medicine

## 2012-11-23 ENCOUNTER — Encounter: Payer: Self-pay | Admitting: Internal Medicine

## 2012-11-23 VITALS — BP 120/70 | HR 65 | Temp 98.1°F | Ht 60.0 in | Wt 164.5 lb

## 2012-11-23 DIAGNOSIS — G8929 Other chronic pain: Secondary | ICD-10-CM

## 2012-11-23 DIAGNOSIS — C50919 Malignant neoplasm of unspecified site of unspecified female breast: Secondary | ICD-10-CM

## 2012-11-23 DIAGNOSIS — E039 Hypothyroidism, unspecified: Secondary | ICD-10-CM

## 2012-11-23 DIAGNOSIS — K5790 Diverticulosis of intestine, part unspecified, without perforation or abscess without bleeding: Secondary | ICD-10-CM

## 2012-11-23 DIAGNOSIS — E876 Hypokalemia: Secondary | ICD-10-CM

## 2012-11-23 DIAGNOSIS — E7800 Pure hypercholesterolemia, unspecified: Secondary | ICD-10-CM

## 2012-11-23 DIAGNOSIS — I2510 Atherosclerotic heart disease of native coronary artery without angina pectoris: Secondary | ICD-10-CM

## 2012-11-23 DIAGNOSIS — K219 Gastro-esophageal reflux disease without esophagitis: Secondary | ICD-10-CM

## 2012-11-23 DIAGNOSIS — K5730 Diverticulosis of large intestine without perforation or abscess without bleeding: Secondary | ICD-10-CM

## 2012-11-23 DIAGNOSIS — M549 Dorsalgia, unspecified: Secondary | ICD-10-CM

## 2012-11-23 DIAGNOSIS — I10 Essential (primary) hypertension: Secondary | ICD-10-CM

## 2012-11-29 ENCOUNTER — Ambulatory Visit: Payer: Self-pay | Admitting: Surgery

## 2012-12-02 ENCOUNTER — Encounter: Payer: Self-pay | Admitting: Internal Medicine

## 2012-12-02 DIAGNOSIS — C50919 Malignant neoplasm of unspecified site of unspecified female breast: Secondary | ICD-10-CM | POA: Insufficient documentation

## 2012-12-02 NOTE — Progress Notes (Signed)
Subjective:    Patient ID: Laurie Hale, female    DOB: Mar 11, 1940, 72 y.o.   MRN: [id number]  HPI 72 year old female with past history of CAD followed by Dr Gwen Pounds, hypercholesterolemia, hypertension, GERD and diverticulosis.  She comes in today to follow up on these issues as well as for a complete physical exam.  She declined physical today.  Is planning to have breast surgery soon.  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  States she has had her pre op.  Also reports cardiology has seen her recently with negative stress test.   She states that overall she is doing relatively well.  Is s/p TKR in 11/13 (Dr Valentina Gu).  Has done well.  Still doing exercises at home.  Still seeing Dr Vear Clock for her back.  Still with pain, but stable on current medication regimen.  She feels her heart is stable. Had a stress test, echo and EKG recently.   States everything checked out fine.  Eating and drinking well.  Bowels stable.  Taking miralax.  Some decreased appetite.  No nausea or vomiting.     Past Medical History  Diagnosis Date  . Hypertension     takes Amlodipine  and Lisinopril daily  . Hyperlipidemia     takes Crestor and Niacin daily  . Coronary artery disease   . History of migraine     hasn't had one in yrs-per pt Zoloft stopped them  . Arthritis   . Joint pain   . Joint swelling   . Chronic back pain   . GERD (gastroesophageal reflux disease)     takes a med daily-to bring with med name at surgery  . Constipation   . Diverticulosis   . Peripheral edema     takes HCTZ daily  . Hypokalemia     takes K Dur tid  . Presence of pessary   . Hypothyroidism     takes Synthroid daily  . Cataract     immature bilateral  . Insomnia     takes restoril prn    Outpatient Encounter Prescriptions as of 11/23/2012  Medication Sig Dispense Refill  . amLODipine (NORVASC) 5 MG tablet Take 1 tablet (5 mg total) by mouth daily.  90 tablet  1  . gabapentin (NEURONTIN) 300 MG  capsule Take 300 mg by mouth daily.      . hydrochlorothiazide (HYDRODIURIL) 25 MG tablet Take 25 mg by mouth daily.      Marland Kitchen levothyroxine (SYNTHROID, LEVOTHROID) 75 MCG tablet Take 1 tablet (75 mcg total) by mouth daily.  30 tablet  2  . lisinopril (PRINIVIL,ZESTRIL) 40 MG tablet Take 40 mg by mouth daily.      . meloxicam (MOBIC) 7.5 MG tablet Take 7.5 mg by mouth daily.      Marland Kitchen morphine (MS CONTIN) 60 MG 12 hr tablet Take 60 mg by mouth every 8 (eight) hours as needed. For pain      . pantoprazole (PROTONIX) 40 MG tablet Take 40 mg by mouth daily.      . potassium chloride (K-DUR) 10 MEQ tablet Take 1 tablet (10 mEq total) by mouth 3 (three) times daily.  270 tablet  1  . rosuvastatin (CRESTOR) 5 MG tablet Take 5 mg by mouth daily.      . sertraline (ZOLOFT) 100 MG tablet Take 100 mg by mouth daily.      . [DISCONTINUED] enoxaparin (LOVENOX) 40 MG/0.4ML injection Inject 0.4 mLs (40 mg total)  into the skin daily.  12 Syringe  0   No facility-administered encounter medications on file as of 11/23/2012.    Review of Systems Patient denies any headache, lightheadedness or dizziness.  No sinus or allergy symptoms.  No chest pain, tightness or palpitations.  No increased shortness of breath, cough or congestion.  No nausea or vomiting.  No acid reflux.  Some decreased appetite.   No abdominal pain or cramping.  No bowel change, such as diarrhea, constipation, BRBPR or melana.  miralax helping.   No urine change.  Still with back pain.  Stable.  Knee doing better.       Objective:   Physical Exam  Filed Vitals:   11/23/12 1340  BP: 120/70  Pulse: 65  Temp: 98.1 F (36.7 C)   Blood pressure recheck:  64/5  72 year old female in no acute distress.   HEENT:  Nares- clear.  Oropharynx - without lesions. NECK:  Supple.  Nontender.    HEART:  Appears to be regular. LUNGS:  No crackles or wheezing audible.  Respirations even and unlabored.  RADIAL PULSE:  Equal bilaterally.   ABDOMEN:   Soft, nontender.  Bowel sounds present and normal.  No audible abdominal bruit.    EXTREMITIES:  No increased edema present.  DP pulses palpable and equal bilaterally.          Assessment & Plan:  HEALTH MAINTENANCE.  Will schedule a physical for next visit.  She wanted to postpone today.

## 2012-12-02 NOTE — Assessment & Plan Note (Signed)
On Crestor.  Continue low cholesterol diet and exercise.  Follow lipid panel and liver function.  

## 2012-12-02 NOTE — Assessment & Plan Note (Signed)
Last potassium check wnl.  Follow.   

## 2012-12-02 NOTE — Assessment & Plan Note (Signed)
Blood pressure doing well.  Same medication regimen.  Follow metabolic panel.   

## 2012-12-02 NOTE — Assessment & Plan Note (Signed)
Currently stable.  Just evaluated by cardiology.  Had ECHO, stress test and EKG. States everything checked out fine.  Continue risk factor modification.  Instructed her to notify cardiology of her upcoming surgery for cardiac clearance.

## 2012-12-02 NOTE — Assessment & Plan Note (Signed)
On thyroid replacement.  Last tsh elevated.  Synthroid increased to q day.  Follow tsh.

## 2012-12-02 NOTE — Assessment & Plan Note (Signed)
Reflux controlled on Protonix.  Follow.  

## 2012-12-02 NOTE — Assessment & Plan Note (Signed)
Followed by Dr Phillips.  Stable.    

## 2012-12-02 NOTE — Assessment & Plan Note (Signed)
Bowels stable.  Per report, up to date with colonoscopy.  Need to obtain records.    

## 2012-12-02 NOTE — Assessment & Plan Note (Signed)
Recent biopsy revealed DCIS.  Planning surgery with Dr Michela Pitcher.

## 2012-12-04 ENCOUNTER — Encounter: Payer: Self-pay | Admitting: Internal Medicine

## 2012-12-04 LAB — PATHOLOGY REPORT

## 2012-12-06 ENCOUNTER — Encounter: Payer: Self-pay | Admitting: Internal Medicine

## 2012-12-07 MED ORDER — LEVOTHYROXINE SODIUM 75 MCG PO TABS: 75.0000 ug | ORAL_TABLET | Freq: Every day | ORAL | Status: DC

## 2012-12-07 NOTE — Telephone Encounter (Signed)
rx sent if for synthroid #30 with 4 refills.  Pt notified of correct dose.

## 2012-12-25 ENCOUNTER — Ambulatory Visit: Payer: Self-pay | Admitting: Internal Medicine

## 2013-01-14 ENCOUNTER — Ambulatory Visit: Payer: Self-pay | Admitting: Internal Medicine

## 2013-01-28 ENCOUNTER — Encounter: Payer: Self-pay | Admitting: Internal Medicine

## 2013-02-04 ENCOUNTER — Ambulatory Visit: Payer: Medicare HMO | Admitting: Internal Medicine

## 2013-02-04 LAB — CBC CANCER CENTER
Basophil %: 0.3 %
Eosinophil %: 3.2 %
HGB: 12.4 g/dL (ref 12.0–16.0)
Lymphocyte #: 1.4 x10 3/mm (ref 1.0–3.6)
Lymphocyte %: 28 %
MCHC: 32.6 g/dL (ref 32.0–36.0)
MCV: 90 fL (ref 80–100)
Monocyte %: 8.8 %
Neutrophil #: 3 x10 3/mm (ref 1.4–6.5)
Platelet: 188 x10 3/mm (ref 150–440)
RDW: 12.9 % (ref 11.5–14.5)
WBC: 5 x10 3/mm (ref 3.6–11.0)

## 2013-02-11 LAB — CBC CANCER CENTER
Basophil %: 0.3 %
Eosinophil #: 0.2 x10 3/mm (ref 0.0–0.7)
HCT: 38.9 % (ref 35.0–47.0)
Lymphocyte #: 1.2 x10 3/mm (ref 1.0–3.6)
Lymphocyte %: 21.2 %
MCH: 29.6 pg (ref 26.0–34.0)
Monocyte #: 0.4 x10 3/mm (ref 0.2–0.9)
Neutrophil #: 3.8 x10 3/mm (ref 1.4–6.5)
Platelet: 186 x10 3/mm (ref 150–440)
RBC: 4.31 10*6/uL (ref 3.80–5.20)
RDW: 13.5 % (ref 11.5–14.5)
WBC: 5.6 x10 3/mm (ref 3.6–11.0)

## 2013-02-12 ENCOUNTER — Telehealth: Payer: Self-pay | Admitting: Internal Medicine

## 2013-02-12 NOTE — Telephone Encounter (Signed)
levothyroxine (SYNTHROID, LEVOTHROID) 75 MCG tablet  30 day supply  Please call into [facility] on [address].

## 2013-02-12 NOTE — Telephone Encounter (Signed)
Okay to refill-pt cancelled 10 wk f/u on 12/22. Based on last note, Thyroid medication was decreased & the f/u was to recheck.

## 2013-02-12 NOTE — Telephone Encounter (Signed)
Ok to refill x 2, but please schedule her for a f/u appt with me.  (needs to be or end of 1/2 day)

## 2013-02-13 ENCOUNTER — Other Ambulatory Visit: Payer: Self-pay | Admitting: *Deleted

## 2013-02-13 ENCOUNTER — Encounter: Payer: Self-pay | Admitting: Internal Medicine

## 2013-02-13 MED ORDER — LEVOTHYROXINE SODIUM 75 MCG PO TABS: 75.0000 ug | ORAL_TABLET | Freq: Every day | ORAL | Status: DC

## 2013-02-13 NOTE — Telephone Encounter (Signed)
Rx sent to pharmacy with note for patient to call office for appt

## 2013-02-14 ENCOUNTER — Ambulatory Visit: Payer: Self-pay | Admitting: Internal Medicine

## 2013-02-15 ENCOUNTER — Other Ambulatory Visit: Payer: Self-pay | Admitting: *Deleted

## 2013-02-15 MED ORDER — LEVOTHYROXINE SODIUM 75 MCG PO TABS: 75.0000 ug | ORAL_TABLET | Freq: Every day | ORAL | Status: DC

## 2013-02-15 NOTE — Telephone Encounter (Signed)
Resent Rx to correct pharmacy.  

## 2013-02-18 LAB — CBC CANCER CENTER
Basophil #: 0 x10 3/mm (ref 0.0–0.1)
Basophil %: 0.5 %
EOS ABS: 0.1 x10 3/mm (ref 0.0–0.7)
Eosinophil %: 2.6 %
HCT: 40.3 % (ref 35.0–47.0)
HGB: 13.1 g/dL (ref 12.0–16.0)
LYMPHS ABS: 0.8 x10 3/mm — AB (ref 1.0–3.6)
Lymphocyte %: 14.4 %
MCH: 29.2 pg (ref 26.0–34.0)
MCHC: 32.4 g/dL (ref 32.0–36.0)
MCV: 90 fL (ref 80–100)
Monocyte #: 0.3 x10 3/mm (ref 0.2–0.9)
Monocyte %: 5.7 %
Neutrophil #: 4.4 x10 3/mm (ref 1.4–6.5)
Neutrophil %: 76.8 %
PLATELETS: 191 x10 3/mm (ref 150–440)
RBC: 4.48 10*6/uL (ref 3.80–5.20)
RDW: 13.3 % (ref 11.5–14.5)
WBC: 5.7 x10 3/mm (ref 3.6–11.0)

## 2013-02-25 LAB — CBC CANCER CENTER
Basophil #: 0 x10 3/mm (ref 0.0–0.1)
Basophil %: 0.3 %
Eosinophil #: 0.2 x10 3/mm (ref 0.0–0.7)
Eosinophil %: 3.9 %
HCT: 38.5 % (ref 35.0–47.0)
HGB: 12.5 g/dL (ref 12.0–16.0)
Lymphocyte #: 1 x10 3/mm (ref 1.0–3.6)
Lymphocyte %: 19.8 %
MCH: 29.6 pg (ref 26.0–34.0)
MCHC: 32.5 g/dL (ref 32.0–36.0)
MCV: 91 fL (ref 80–100)
MONO ABS: 0.4 x10 3/mm (ref 0.2–0.9)
MONOS PCT: 7.6 %
NEUTROS PCT: 68.4 %
Neutrophil #: 3.4 x10 3/mm (ref 1.4–6.5)
PLATELETS: 166 x10 3/mm (ref 150–440)
RBC: 4.23 10*6/uL (ref 3.80–5.20)
RDW: 13.4 % (ref 11.5–14.5)
WBC: 5 x10 3/mm (ref 3.6–11.0)

## 2013-03-04 LAB — CBC CANCER CENTER
Basophil #: 0 x10 3/mm (ref 0.0–0.1)
Basophil %: 0.2 %
EOS PCT: 1.5 %
Eosinophil #: 0.1 x10 3/mm (ref 0.0–0.7)
HCT: 39.4 % (ref 35.0–47.0)
HGB: 13.2 g/dL (ref 12.0–16.0)
Lymphocyte #: 0.7 x10 3/mm — ABNORMAL LOW (ref 1.0–3.6)
Lymphocyte %: 9.9 %
MCH: 30.3 pg (ref 26.0–34.0)
MCHC: 33.5 g/dL (ref 32.0–36.0)
MCV: 90 fL (ref 80–100)
Monocyte #: 0.3 x10 3/mm (ref 0.2–0.9)
Monocyte %: 4.5 %
NEUTROS ABS: 5.6 x10 3/mm (ref 1.4–6.5)
Neutrophil %: 83.9 %
Platelet: 164 x10 3/mm (ref 150–440)
RBC: 4.36 10*6/uL (ref 3.80–5.20)
RDW: 13.4 % (ref 11.5–14.5)
WBC: 6.7 x10 3/mm (ref 3.6–11.0)

## 2013-03-17 ENCOUNTER — Ambulatory Visit: Payer: Self-pay | Admitting: Internal Medicine

## 2013-03-29 ENCOUNTER — Encounter: Payer: Self-pay | Admitting: Internal Medicine

## 2013-03-29 ENCOUNTER — Ambulatory Visit (INDEPENDENT_AMBULATORY_CARE_PROVIDER_SITE_OTHER): Payer: Medicare HMO | Admitting: Internal Medicine

## 2013-03-29 VITALS — BP 130/64 | HR 72 | Temp 98.2°F | Ht 60.0 in | Wt 170.5 lb

## 2013-03-29 DIAGNOSIS — R5383 Other fatigue: Secondary | ICD-10-CM

## 2013-03-29 DIAGNOSIS — K5730 Diverticulosis of large intestine without perforation or abscess without bleeding: Secondary | ICD-10-CM

## 2013-03-29 DIAGNOSIS — C50919 Malignant neoplasm of unspecified site of unspecified female breast: Secondary | ICD-10-CM

## 2013-03-29 DIAGNOSIS — M549 Dorsalgia, unspecified: Secondary | ICD-10-CM

## 2013-03-29 DIAGNOSIS — E876 Hypokalemia: Secondary | ICD-10-CM

## 2013-03-29 DIAGNOSIS — I10 Essential (primary) hypertension: Secondary | ICD-10-CM

## 2013-03-29 DIAGNOSIS — R5381 Other malaise: Secondary | ICD-10-CM

## 2013-03-29 DIAGNOSIS — E7800 Pure hypercholesterolemia, unspecified: Secondary | ICD-10-CM

## 2013-03-29 DIAGNOSIS — G8929 Other chronic pain: Secondary | ICD-10-CM

## 2013-03-29 DIAGNOSIS — K5790 Diverticulosis of intestine, part unspecified, without perforation or abscess without bleeding: Secondary | ICD-10-CM

## 2013-03-29 DIAGNOSIS — I2510 Atherosclerotic heart disease of native coronary artery without angina pectoris: Secondary | ICD-10-CM

## 2013-03-29 DIAGNOSIS — K219 Gastro-esophageal reflux disease without esophagitis: Secondary | ICD-10-CM

## 2013-03-29 DIAGNOSIS — E039 Hypothyroidism, unspecified: Secondary | ICD-10-CM

## 2013-03-29 MED ORDER — LEVOTHYROXINE SODIUM 75 MCG PO TABS: 75.0000 ug | ORAL_TABLET | Freq: Every day | ORAL | Status: DC

## 2013-03-29 MED ORDER — AMLODIPINE BESYLATE 5 MG PO TABS: 5.0000 mg | ORAL_TABLET | Freq: Every day | ORAL | Status: DC

## 2013-03-29 MED ORDER — MELOXICAM 7.5 MG PO TABS: 7.5000 mg | ORAL_TABLET | Freq: Every day | ORAL | Status: DC

## 2013-03-29 MED ORDER — POTASSIUM CHLORIDE ER 10 MEQ PO TBCR: 10.0000 meq | EXTENDED_RELEASE_TABLET | Freq: Three times a day (TID) | ORAL | Status: DC

## 2013-03-29 NOTE — Progress Notes (Signed)
Pre-visit discussion using our clinic review tool. No additional management support is needed unless otherwise documented below in the visit note.  

## 2013-04-02 ENCOUNTER — Encounter: Payer: Self-pay | Admitting: Internal Medicine

## 2013-04-02 DIAGNOSIS — R5383 Other fatigue: Secondary | ICD-10-CM | POA: Insufficient documentation

## 2013-04-02 NOTE — Progress Notes (Signed)
Subjective:    Patient ID: Laurie Hale, female    DOB: 07/17/1940, 73 y.o.   MRN: [id number]  HPI 73 year old female with past history of CAD followed by Dr Nehemiah Massed, hypercholesterolemia, hypertension, GERD and diverticulosis.  She comes in today for a scheduled follow up.  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  S/p surgery.  Seeing Dr Pat Patrick.  Saw Dr Ma Hillock.  She declined treatment through oncology.  She is seeing Dr Donella Stade for XRT.  Has one more treatment.  Main complaint is that of fatigue.    Is s/p TKR in 11/13 (Dr Lorre Nick).  Has done well.  Still doing exercises at home.  Still seeing Dr Hardin Negus for her back.  Still with pain, but stable on current medication regimen.  She feels her heart is stable. Had a stress test, echo and EKG recently.   States everything checked out fine.  Eating and drinking well.  Bowels stable.  Taking miralax.  No nausea or vomiting.  Overall she feels things are relatively stable except for the increased fatigue.     Past Medical History  Diagnosis Date  . Hypertension     takes Amlodipine  and Lisinopril daily  . Hyperlipidemia     takes Crestor and Niacin daily  . Coronary artery disease   . History of migraine     hasn't had one in yrs-per pt Zoloft stopped them  . Arthritis   . Joint pain   . Joint swelling   . Chronic back pain   . GERD (gastroesophageal reflux disease)     takes a med daily-to bring with med name at surgery  . Constipation   . Diverticulosis   . Peripheral edema     takes HCTZ daily  . Hypokalemia     takes K Dur tid  . Presence of pessary   . Hypothyroidism     takes Synthroid daily  . Cataract     immature bilateral  . Insomnia     takes restoril prn    Outpatient Encounter Prescriptions as of 03/29/2013  Medication Sig  . amLODipine (NORVASC) 5 MG tablet Take 1 tablet (5 mg total) by mouth daily.  Marland Kitchen gabapentin (NEURONTIN) 300 MG capsule Take 300 mg by mouth daily.  . hydrochlorothiazide  (HYDRODIURIL) 25 MG tablet Take 25 mg by mouth daily.  Marland Kitchen levothyroxine (SYNTHROID, LEVOTHROID) 75 MCG tablet Take 1 tablet (75 mcg total) by mouth daily.  Marland Kitchen lisinopril (PRINIVIL,ZESTRIL) 40 MG tablet Take 40 mg by mouth daily.  . meloxicam (MOBIC) 7.5 MG tablet Take 1 tablet (7.5 mg total) by mouth daily.  Marland Kitchen morphine (MS CONTIN) 60 MG 12 hr tablet Take 60 mg by mouth every 8 (eight) hours as needed. For pain  . pantoprazole (PROTONIX) 40 MG tablet Take 40 mg by mouth daily.  . potassium chloride (K-DUR) 10 MEQ tablet Take 1 tablet (10 mEq total) by mouth 3 (three) times daily.  . rosuvastatin (CRESTOR) 5 MG tablet Take 5 mg by mouth daily.  . sertraline (ZOLOFT) 100 MG tablet Take 100 mg by mouth daily.  . [DISCONTINUED] amLODipine (NORVASC) 5 MG tablet Take 1 tablet (5 mg total) by mouth daily.  . [DISCONTINUED] levothyroxine (SYNTHROID, LEVOTHROID) 75 MCG tablet Take 1 tablet (75 mcg total) by mouth daily. NEEDS AN APPOINTMENT BEFORE RX RUNS OUT-PLEASE CALL OFFICE FOR APPT  . [DISCONTINUED] meloxicam (MOBIC) 7.5 MG tablet Take 7.5 mg by mouth daily.  . [DISCONTINUED] potassium chloride (  K-DUR) 10 MEQ tablet Take 1 tablet (10 mEq total) by mouth 3 (three) times daily.    Review of Systems Patient denies any headache, lightheadedness or dizziness.  No sinus or allergy symptoms.  No chest pain, tightness or palpitations.  No increased shortness of breath, cough or congestion.  No nausea or vomiting.  No acid reflux.  No abdominal pain or cramping.  No bowel change, such as diarrhea, constipation, BRBPR or melana.  miralax helping.   No urine change.  Still with back pain.  Stable.  Knee doing better.  S/p breast surgery.  Receiving XRT.  Increased fatigue.      Objective:   Physical Exam  Filed Vitals:   03/29/13 1110  BP: 130/64  Pulse: 72  Temp: 98.2 F (36.8 C)   Blood pressure recheck:  56/17  73 year old female in no acute distress.   HEENT:  Nares- clear.  Oropharynx - without  lesions. NECK:  Supple.  Nontender.    HEART:  Appears to be regular. LUNGS:  No crackles or wheezing audible.  Respirations even and unlabored.  RADIAL PULSE:  Equal bilaterally.   ABDOMEN:  Soft, nontender.  Bowel sounds present and normal.  No audible abdominal bruit.    EXTREMITIES:  No increased edema present.  DP pulses palpable and equal bilaterally.          Assessment & Plan:  HEALTH MAINTENANCE.  Will schedule a physical for next visit.  She wanted to postpone her last scheduled physical.

## 2013-04-02 NOTE — Assessment & Plan Note (Signed)
Bowels stable.  Per report, up to date with colonoscopy.  Need to obtain records.

## 2013-04-02 NOTE — Assessment & Plan Note (Signed)
Probably multifactorial.  Receiving XRT.  Check cbc, metabolic panel and tsh.

## 2013-04-02 NOTE — Assessment & Plan Note (Signed)
Followed by Dr Phillips.  Stable.    

## 2013-04-02 NOTE — Assessment & Plan Note (Signed)
Currently stable.  Just evaluated by cardiology.  Had ECHO, stress test and EKG. States everything checked out fine.  Continue risk factor modification.

## 2013-04-02 NOTE — Assessment & Plan Note (Signed)
On Crestor.  Continue low cholesterol diet and exercise.  Follow lipid panel and liver function.  

## 2013-04-02 NOTE — Assessment & Plan Note (Signed)
Reflux controlled on Protonix.  Follow.  

## 2013-04-02 NOTE — Assessment & Plan Note (Signed)
Recent biopsy revealed DCIS.  S/p surgery (Dr Pat Patrick).  Saw Dr Ma Hillock.  Declined further treatment.  Receiving XRT (Dr Donella Stade).  One more treatment.  Stable.  Fatigue.

## 2013-04-02 NOTE — Assessment & Plan Note (Signed)
Blood pressure doing well.  Same medication regimen.  Follow metabolic panel.   

## 2013-04-02 NOTE — Assessment & Plan Note (Signed)
Last potassium check wnl.  Follow.   

## 2013-04-02 NOTE — Assessment & Plan Note (Signed)
On thyroid replacement.  Follow tsh.  

## 2013-04-08 ENCOUNTER — Encounter: Payer: Self-pay | Admitting: Internal Medicine

## 2013-04-08 ENCOUNTER — Other Ambulatory Visit: Payer: Self-pay | Admitting: *Deleted

## 2013-04-09 MED ORDER — POTASSIUM CHLORIDE ER 10 MEQ PO TBCR: 10.0000 meq | EXTENDED_RELEASE_TABLET | Freq: Three times a day (TID) | ORAL | Status: DC

## 2013-04-10 ENCOUNTER — Other Ambulatory Visit: Payer: Self-pay | Admitting: *Deleted

## 2013-04-10 MED ORDER — POTASSIUM CHLORIDE ER 10 MEQ PO TBCR: 10.0000 meq | EXTENDED_RELEASE_TABLET | Freq: Three times a day (TID) | ORAL | Status: DC

## 2013-04-10 MED ORDER — MELOXICAM 7.5 MG PO TABS: 7.5000 mg | ORAL_TABLET | Freq: Every day | ORAL | Status: DC

## 2013-04-10 MED ORDER — LEVOTHYROXINE SODIUM 75 MCG PO TABS: 75.0000 ug | ORAL_TABLET | Freq: Every day | ORAL | Status: DC

## 2013-04-10 MED ORDER — AMLODIPINE BESYLATE 5 MG PO TABS: 5.0000 mg | ORAL_TABLET | Freq: Every day | ORAL | Status: DC

## 2013-04-14 ENCOUNTER — Ambulatory Visit: Payer: Self-pay | Admitting: Internal Medicine

## 2013-04-16 ENCOUNTER — Other Ambulatory Visit: Payer: Medicare HMO

## 2013-05-07 ENCOUNTER — Telehealth: Payer: Self-pay | Admitting: Internal Medicine

## 2013-05-07 ENCOUNTER — Encounter: Payer: Self-pay | Admitting: *Deleted

## 2013-05-07 ENCOUNTER — Other Ambulatory Visit (INDEPENDENT_AMBULATORY_CARE_PROVIDER_SITE_OTHER): Payer: Medicare HMO

## 2013-05-07 DIAGNOSIS — R5381 Other malaise: Secondary | ICD-10-CM

## 2013-05-07 DIAGNOSIS — E039 Hypothyroidism, unspecified: Secondary | ICD-10-CM

## 2013-05-07 DIAGNOSIS — E7800 Pure hypercholesterolemia, unspecified: Secondary | ICD-10-CM

## 2013-05-07 DIAGNOSIS — R5383 Other fatigue: Secondary | ICD-10-CM

## 2013-05-07 DIAGNOSIS — I10 Essential (primary) hypertension: Secondary | ICD-10-CM

## 2013-05-07 LAB — BASIC METABOLIC PANEL
BUN: 18 mg/dL (ref 6–23)
CO2: 34 meq/L — AB (ref 19–32)
Calcium: 9.5 mg/dL (ref 8.4–10.5)
Chloride: 97 mEq/L (ref 96–112)
Creatinine, Ser: 1 mg/dL (ref 0.4–1.2)
GFR: 58.18 mL/min — ABNORMAL LOW (ref >60.00)
GLUCOSE: 110 mg/dL — AB (ref 70–99)
POTASSIUM: 3.5 meq/L (ref 3.5–5.1)
Sodium: 138 mEq/L (ref 135–145)

## 2013-05-07 LAB — CBC WITH DIFFERENTIAL/PLATELET
Basophils Absolute: 0 10*3/uL (ref 0.0–0.1)
Basophils Relative: 0.5 % (ref 0.0–3.0)
Eosinophils Absolute: 0.1 10*3/uL (ref 0.0–0.7)
Eosinophils Relative: 3 % (ref 0.0–5.0)
HEMATOCRIT: 36.6 % (ref 36.0–46.0)
Hemoglobin: 12.3 g/dL (ref 12.0–15.0)
Lymphocytes Relative: 21.4 % (ref 12.0–46.0)
Lymphs Abs: 0.9 10*3/uL (ref 0.7–4.0)
MCHC: 33.6 g/dL (ref 30.0–36.0)
MCV: 91.4 fl (ref 78.0–100.0)
Monocytes Absolute: 0.4 10*3/uL (ref 0.1–1.0)
Monocytes Relative: 10 % (ref 3.0–12.0)
Neutro Abs: 2.9 10*3/uL (ref 1.4–7.7)
Neutrophils Relative %: 65.1 % (ref 43.0–77.0)
PLATELETS: 191 10*3/uL (ref 150.0–400.0)
RBC: 4.01 Mil/uL (ref 3.87–5.11)
RDW: 13.6 % (ref 11.5–14.6)
WBC: 4.4 10*3/uL — ABNORMAL LOW (ref 4.5–10.5)

## 2013-05-07 LAB — HEPATIC FUNCTION PANEL
ALT: 14 U/L (ref 0–35)
AST: 21 U/L (ref 0–37)
Albumin: 3.9 g/dL (ref 3.5–5.2)
Alkaline Phosphatase: 48 U/L (ref 39–117)
BILIRUBIN TOTAL: 0.7 mg/dL (ref 0.3–1.2)
Bilirubin, Direct: 0.1 mg/dL (ref 0.0–0.3)
Total Protein: 6.3 g/dL (ref 6.0–8.3)

## 2013-05-07 LAB — LIPID PANEL
CHOL/HDL RATIO: 4
Cholesterol: 188 mg/dL (ref 0–200)
HDL: 43.5 mg/dL (ref >39.00)
LDL Cholesterol: 85 mg/dL (ref 0–99)
Triglycerides: 298 mg/dL — ABNORMAL HIGH (ref 0.0–149.0)
VLDL: 59.6 mg/dL — AB (ref 0.0–40.0)

## 2013-05-07 LAB — TSH: TSH: 2.04 u[IU]/mL (ref 0.35–5.50)

## 2013-05-07 LAB — VITAMIN B12: Vitamin B-12: 281 pg/mL (ref 211–911)

## 2013-05-07 NOTE — Telephone Encounter (Signed)
Pt came in today for labs and wanted to know if we have any samples of crestor she could get

## 2013-05-07 NOTE — Telephone Encounter (Signed)
Sent mychart message to pick up samples at front desk

## 2013-05-14 ENCOUNTER — Encounter: Payer: Self-pay | Admitting: Internal Medicine

## 2013-05-16 NOTE — Telephone Encounter (Signed)
Mailed unread message to pt  

## 2013-06-18 ENCOUNTER — Encounter: Payer: Self-pay | Admitting: Internal Medicine

## 2013-06-18 ENCOUNTER — Telehealth: Payer: Self-pay | Admitting: Internal Medicine

## 2013-06-18 NOTE — Telephone Encounter (Signed)
Pt left vm.  States she has an appt with Dr. Nicholaus Bloom tomorrow.  Asking for call to give approval.  Also Dr. Signa Kell at [facility].  Pt asking for a call.

## 2013-06-19 NOTE — Telephone Encounter (Signed)
I have faxed [facility] referral to both Dr. Hardin Negus (spoke with Tiffany at his office) she gave me dx and cpt codes.  [facility] I spoke withMarie who gave me the dx and cpt codes for patient to see Dr. Tillman Sers . I know that [facility] is part of [facility] and will be approved not sure about Dr. Hardin Negus office but patient should still go to the appointment.

## 2013-06-27 ENCOUNTER — Telehealth: Payer: Self-pay | Admitting: Internal Medicine

## 2013-06-27 NOTE — Telephone Encounter (Signed)
Received fax from [place] [facility] back Treating Provider Dr. Tillman Sers # of Visits 4 Start Date 06/22/13 End Date 09/20/13 CPT 99499 DX 375.00

## 2013-07-07 NOTE — Telephone Encounter (Signed)
Laurie Hale at 06/27/2013 12:56 PM     Status: Signed        Received fax from [place] back  Treating Provider Dr. Tillman Sers  # of Visits 4  Start Date 06/22/13  End Date 09/20/13  CPT 99499  DX 375.00

## 2013-07-11 ENCOUNTER — Encounter: Payer: Self-pay | Admitting: Internal Medicine

## 2013-07-11 ENCOUNTER — Ambulatory Visit (INDEPENDENT_AMBULATORY_CARE_PROVIDER_SITE_OTHER): Payer: Medicare HMO | Admitting: Internal Medicine

## 2013-07-11 VITALS — BP 110/60 | HR 61 | Temp 98.1°F | Resp 16 | Ht 59.5 in | Wt 164.5 lb

## 2013-07-11 DIAGNOSIS — R5381 Other malaise: Secondary | ICD-10-CM

## 2013-07-11 DIAGNOSIS — E039 Hypothyroidism, unspecified: Secondary | ICD-10-CM

## 2013-07-11 DIAGNOSIS — E7800 Pure hypercholesterolemia, unspecified: Secondary | ICD-10-CM

## 2013-07-11 DIAGNOSIS — G8929 Other chronic pain: Secondary | ICD-10-CM

## 2013-07-11 DIAGNOSIS — I2510 Atherosclerotic heart disease of native coronary artery without angina pectoris: Secondary | ICD-10-CM

## 2013-07-11 DIAGNOSIS — M549 Dorsalgia, unspecified: Secondary | ICD-10-CM

## 2013-07-11 DIAGNOSIS — C50919 Malignant neoplasm of unspecified site of unspecified female breast: Secondary | ICD-10-CM

## 2013-07-11 DIAGNOSIS — J3489 Other specified disorders of nose and nasal sinuses: Secondary | ICD-10-CM

## 2013-07-11 DIAGNOSIS — K219 Gastro-esophageal reflux disease without esophagitis: Secondary | ICD-10-CM

## 2013-07-11 DIAGNOSIS — E876 Hypokalemia: Secondary | ICD-10-CM

## 2013-07-11 DIAGNOSIS — K5790 Diverticulosis of intestine, part unspecified, without perforation or abscess without bleeding: Secondary | ICD-10-CM

## 2013-07-11 DIAGNOSIS — K5730 Diverticulosis of large intestine without perforation or abscess without bleeding: Secondary | ICD-10-CM

## 2013-07-11 DIAGNOSIS — R5383 Other fatigue: Secondary | ICD-10-CM

## 2013-07-11 DIAGNOSIS — I10 Essential (primary) hypertension: Secondary | ICD-10-CM

## 2013-07-11 MED ORDER — MELOXICAM 7.5 MG PO TABS: 7.5000 mg | ORAL_TABLET | Freq: Every day | ORAL | Status: DC

## 2013-07-11 MED ORDER — GABAPENTIN 300 MG PO CAPS: 300.0000 mg | ORAL_CAPSULE | Freq: Every day | ORAL | Status: DC

## 2013-07-11 MED ORDER — LISINOPRIL 40 MG PO TABS: 40.0000 mg | ORAL_TABLET | Freq: Every day | ORAL | Status: DC

## 2013-07-11 MED ORDER — PANTOPRAZOLE SODIUM 40 MG PO TBEC: 40.0000 mg | DELAYED_RELEASE_TABLET | Freq: Every day | ORAL | Status: DC

## 2013-07-11 MED ORDER — HYDROCHLOROTHIAZIDE 25 MG PO TABS: 25.0000 mg | ORAL_TABLET | Freq: Every day | ORAL | Status: DC

## 2013-07-11 MED ORDER — SERTRALINE HCL 100 MG PO TABS: 100.0000 mg | ORAL_TABLET | Freq: Every day | ORAL | Status: DC

## 2013-07-11 NOTE — Progress Notes (Signed)
Subjective:    Patient ID: Laurie Hale, female    DOB: Jun 13, 1940, 73 y.o.   MRN: [id number]  HPI 73 year old female with past history of CAD followed by Dr Nehemiah Massed, hypercholesterolemia, hypertension, GERD and diverticulosis.  She comes in today to follow up on these issues as well as for a complete physical exam.   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  S/p surgery.  Seeing Dr Pat Patrick.  Saw Dr Ma Hillock.  She declined treatment through oncology.  She is seeing Dr Donella Stade for XRT.  Has completed her treatment.  Still with some fatigue.   Is s/p TKR in 11/13 (Dr Lorre Nick).  Has done well.  Still doing exercises at home.  Still seeing Dr Hardin Negus for her back.  Still with pain, but stable on current medication regimen.  She feels her heart is stable. Had a stress test, echo and EKG recently.   States everything checked out fine.  Eating and drinking well.  Bowels stable.  Taking miralax.  No nausea or vomiting.  Overall she feels things are relatively stable.      Past Medical History  Diagnosis Date  . Hypertension     takes Amlodipine  and Lisinopril daily  . Hyperlipidemia     takes Crestor and Niacin daily  . Coronary artery disease   . History of migraine     hasn't had one in yrs-per pt Zoloft stopped them  . Arthritis   . Joint pain   . Joint swelling   . Chronic back pain   . GERD (gastroesophageal reflux disease)     takes a med daily-to bring with med name at surgery  . Constipation   . Diverticulosis   . Peripheral edema     takes HCTZ daily  . Hypokalemia     takes K Dur tid  . Presence of pessary   . Hypothyroidism     takes Synthroid daily  . Cataract     immature bilateral  . Insomnia     takes restoril prn    Outpatient Encounter Prescriptions as of 07/11/2013  Medication Sig  . amLODipine (NORVASC) 5 MG tablet Take 1 tablet (5 mg total) by mouth daily.  Marland Kitchen gabapentin (NEURONTIN) 300 MG capsule Take 300 mg by mouth daily.  . hydrochlorothiazide  (HYDRODIURIL) 25 MG tablet Take 25 mg by mouth daily.  Marland Kitchen levothyroxine (SYNTHROID, LEVOTHROID) 75 MCG tablet Take 1 tablet (75 mcg total) by mouth daily.  Marland Kitchen lisinopril (PRINIVIL,ZESTRIL) 40 MG tablet Take 40 mg by mouth daily.  . meloxicam (MOBIC) 7.5 MG tablet Take 1 tablet (7.5 mg total) by mouth daily.  Marland Kitchen morphine (MS CONTIN) 60 MG 12 hr tablet Take 60 mg by mouth every 8 (eight) hours as needed. For pain  . pantoprazole (PROTONIX) 40 MG tablet Take 40 mg by mouth daily.  . potassium chloride (K-DUR) 10 MEQ tablet Take 1 tablet (10 mEq total) by mouth 3 (three) times daily.  . rosuvastatin (CRESTOR) 5 MG tablet Take 5 mg by mouth daily.  . sertraline (ZOLOFT) 100 MG tablet Take 100 mg by mouth daily.    Review of Systems Patient denies any headache, lightheadedness or dizziness.  No sinus or allergy symptoms.  No chest pain, tightness or palpitations.  No increased shortness of breath, cough or congestion.  No nausea or vomiting.  No acid reflux.  No abdominal pain or cramping.  No bowel change, such as diarrhea, constipation, BRBPR or melana.  Miralax helping.   No urine change.  Still with back pain.  Stable.  Knee doing better.  S/p breast surgery.  Completed XRT.  Increased fatigue.      Objective:   Physical Exam  Filed Vitals:   07/11/13 0937  BP: 110/60  Pulse: 61  Temp: 98.1 F (36.7 C)  Resp: 16   Blood pressure recheck:  128/72, pulse 80-81  73 year old female in no acute distress.   HEENT:  Nares- clear.  Oropharynx - without lesions. NECK:  Supple.  Nontender.  No audible bruit.  HEART:  Appears to be regular. LUNGS:  No crackles or wheezing audible.  Respirations even and unlabored.  RADIAL PULSE:  Equal bilaterally.    BREASTS:  No nipple discharge or nipple retraction present.  Could not appreciate any distinct nodules or axillary adenopathy.  ABDOMEN:  Soft, nontender.  Bowel sounds present and normal.  No audible abdominal bruit.  GU:  Not performed.      EXTREMITIES:  No increased edema present.  DP pulses palpable and equal bilaterally.         Assessment & Plan:  HEALTH MAINTENANCE.  Physical today.   Seeing Dr Donella Stade for her breast cancer.  Completed XRT.

## 2013-07-11 NOTE — Progress Notes (Signed)
Pre visit review using our clinic review tool, if applicable. No additional management support is needed unless otherwise documented below in the visit note. 

## 2013-07-14 ENCOUNTER — Encounter: Payer: Self-pay | Admitting: Internal Medicine

## 2013-07-14 DIAGNOSIS — J3489 Other specified disorders of nose and nasal sinuses: Secondary | ICD-10-CM | POA: Insufficient documentation

## 2013-07-14 NOTE — Assessment & Plan Note (Signed)
Bowels stable.  Per report, up to date with colonoscopy.  Still need results.

## 2013-07-14 NOTE — Assessment & Plan Note (Signed)
Persistent nasal lesion and skin changes.  Will refer to Dr Evorn Gong for further evaluation and treatment.

## 2013-07-14 NOTE — Assessment & Plan Note (Signed)
Blood pressure doing well.  Same medication regimen.  Follow metabolic panel.   

## 2013-07-14 NOTE — Assessment & Plan Note (Signed)
On thyroid replacement.  Follow tsh.  

## 2013-07-14 NOTE — Assessment & Plan Note (Signed)
Recent biopsy revealed DCIS.  S/p surgery (Dr Pat Patrick).  Saw Dr Ma Hillock.  Declined further treatment.  Completed XRT (Dr Donella Stade).  Planning to f/u with Dr Donella Stade for her breasts.

## 2013-07-14 NOTE — Assessment & Plan Note (Signed)
Still with some fatigue.  Check routine labs.  Follow.  Continue exercise.    

## 2013-07-14 NOTE — Assessment & Plan Note (Signed)
Last potassium check wnl.  Follow.   

## 2013-07-14 NOTE — Assessment & Plan Note (Signed)
Currently stable.  Recently evaluated by cardiology.  Had ECHO, stress test and EKG. States everything checked out fine.  Continue risk factor modification.   

## 2013-07-14 NOTE — Assessment & Plan Note (Signed)
Followed by Dr Phillips.  Stable.    

## 2013-07-14 NOTE — Assessment & Plan Note (Signed)
Reflux controlled on Protonix.  Follow.  

## 2013-07-14 NOTE — Assessment & Plan Note (Signed)
On Crestor.  Continue low cholesterol diet and exercise.  Follow lipid panel and liver function.  

## 2013-09-05 ENCOUNTER — Other Ambulatory Visit: Payer: Self-pay | Admitting: *Deleted

## 2013-09-05 MED ORDER — PANTOPRAZOLE SODIUM 40 MG PO TBEC: 40.0000 mg | DELAYED_RELEASE_TABLET | Freq: Every day | ORAL | Status: DC

## 2013-09-05 MED ORDER — AMLODIPINE BESYLATE 5 MG PO TABS: 5.0000 mg | ORAL_TABLET | Freq: Every day | ORAL | Status: DC

## 2013-09-05 MED ORDER — POTASSIUM CHLORIDE ER 10 MEQ PO TBCR: 10.0000 meq | EXTENDED_RELEASE_TABLET | Freq: Three times a day (TID) | ORAL | Status: DC

## 2013-09-05 NOTE — Addendum Note (Signed)
Addended by: Wynonia Lawman E on: 09/05/2013 04:58 PM   Modules accepted: Orders

## 2013-10-01 ENCOUNTER — Ambulatory Visit: Payer: Self-pay | Admitting: Internal Medicine

## 2013-10-02 ENCOUNTER — Encounter: Payer: Self-pay | Admitting: Internal Medicine

## 2013-10-03 ENCOUNTER — Other Ambulatory Visit: Payer: Self-pay | Admitting: *Deleted

## 2013-10-03 MED ORDER — LEVOTHYROXINE SODIUM 75 MCG PO TABS: 75.0000 ug | ORAL_TABLET | Freq: Every day | ORAL | Status: DC

## 2013-10-15 ENCOUNTER — Other Ambulatory Visit (INDEPENDENT_AMBULATORY_CARE_PROVIDER_SITE_OTHER): Payer: Medicare HMO

## 2013-10-15 ENCOUNTER — Ambulatory Visit: Payer: Self-pay | Admitting: Internal Medicine

## 2013-10-15 DIAGNOSIS — R5381 Other malaise: Secondary | ICD-10-CM

## 2013-10-15 DIAGNOSIS — I10 Essential (primary) hypertension: Secondary | ICD-10-CM

## 2013-10-15 DIAGNOSIS — E7800 Pure hypercholesterolemia, unspecified: Secondary | ICD-10-CM

## 2013-10-15 DIAGNOSIS — R5383 Other fatigue: Secondary | ICD-10-CM

## 2013-10-15 LAB — LIPID PANEL
CHOLESTEROL: 175 mg/dL (ref 0–200)
HDL: 45 mg/dL (ref >39.00)
LDL CALC: 92 mg/dL (ref 0–99)
NonHDL: 130
TRIGLYCERIDES: 188 mg/dL — AB (ref 0.0–149.0)
Total CHOL/HDL Ratio: 4
VLDL: 37.6 mg/dL (ref 0.0–40.0)

## 2013-10-15 LAB — CBC WITH DIFFERENTIAL/PLATELET
BASOS ABS: 0 10*3/uL (ref 0.0–0.1)
Basophils Relative: 0.3 % (ref 0.0–3.0)
EOS PCT: 2.6 % (ref 0.0–5.0)
Eosinophils Absolute: 0.1 10*3/uL (ref 0.0–0.7)
HCT: 38.7 % (ref 36.0–46.0)
Hemoglobin: 12.7 g/dL (ref 12.0–15.0)
LYMPHS ABS: 1.2 10*3/uL (ref 0.7–4.0)
Lymphocytes Relative: 26.2 % (ref 12.0–46.0)
MCHC: 32.9 g/dL (ref 30.0–36.0)
MCV: 91.4 fl (ref 78.0–100.0)
MONOS PCT: 10.8 % (ref 3.0–12.0)
Monocytes Absolute: 0.5 10*3/uL (ref 0.1–1.0)
NEUTROS PCT: 60.1 % (ref 43.0–77.0)
Neutro Abs: 2.7 10*3/uL (ref 1.4–7.7)
PLATELETS: 188 10*3/uL (ref 150.0–400.0)
RBC: 4.24 Mil/uL (ref 3.87–5.11)
RDW: 12.8 % (ref 11.5–15.5)
WBC: 4.5 10*3/uL (ref 4.0–10.5)

## 2013-10-15 LAB — BASIC METABOLIC PANEL
BUN: 16 mg/dL (ref 6–23)
CALCIUM: 9.3 mg/dL (ref 8.4–10.5)
CO2: 31 mEq/L (ref 19–32)
Chloride: 101 mEq/L (ref 96–112)
Creatinine, Ser: 1.1 mg/dL (ref 0.4–1.2)
GFR: 52 mL/min — AB (ref >60.00)
GLUCOSE: 117 mg/dL — AB (ref 70–99)
POTASSIUM: 4 meq/L (ref 3.5–5.1)
Sodium: 141 mEq/L (ref 135–145)

## 2013-10-15 LAB — HEPATIC FUNCTION PANEL
ALT: 14 U/L (ref 0–35)
AST: 26 U/L (ref 0–37)
Albumin: 3.8 g/dL (ref 3.5–5.2)
Alkaline Phosphatase: 46 U/L (ref 39–117)
BILIRUBIN TOTAL: 0.8 mg/dL (ref 0.2–1.2)
Bilirubin, Direct: 0.1 mg/dL (ref 0.0–0.3)
TOTAL PROTEIN: 6.6 g/dL (ref 6.0–8.3)

## 2013-10-16 ENCOUNTER — Encounter: Payer: Self-pay | Admitting: Internal Medicine

## 2013-10-17 ENCOUNTER — Encounter: Payer: Self-pay | Admitting: Internal Medicine

## 2013-10-17 ENCOUNTER — Ambulatory Visit (INDEPENDENT_AMBULATORY_CARE_PROVIDER_SITE_OTHER): Payer: Commercial Managed Care - HMO | Admitting: Internal Medicine

## 2013-10-17 VITALS — BP 120/70 | HR 60 | Temp 98.0°F | Ht 59.5 in | Wt 162.2 lb

## 2013-10-17 DIAGNOSIS — E039 Hypothyroidism, unspecified: Secondary | ICD-10-CM

## 2013-10-17 DIAGNOSIS — E876 Hypokalemia: Secondary | ICD-10-CM

## 2013-10-17 DIAGNOSIS — I10 Essential (primary) hypertension: Secondary | ICD-10-CM

## 2013-10-17 DIAGNOSIS — J3489 Other specified disorders of nose and nasal sinuses: Secondary | ICD-10-CM

## 2013-10-17 DIAGNOSIS — E7800 Pure hypercholesterolemia, unspecified: Secondary | ICD-10-CM

## 2013-10-17 DIAGNOSIS — M549 Dorsalgia, unspecified: Secondary | ICD-10-CM

## 2013-10-17 DIAGNOSIS — K219 Gastro-esophageal reflux disease without esophagitis: Secondary | ICD-10-CM

## 2013-10-17 DIAGNOSIS — K5730 Diverticulosis of large intestine without perforation or abscess without bleeding: Secondary | ICD-10-CM

## 2013-10-17 DIAGNOSIS — C50919 Malignant neoplasm of unspecified site of unspecified female breast: Secondary | ICD-10-CM

## 2013-10-17 DIAGNOSIS — R5383 Other fatigue: Secondary | ICD-10-CM

## 2013-10-17 DIAGNOSIS — I2510 Atherosclerotic heart disease of native coronary artery without angina pectoris: Secondary | ICD-10-CM

## 2013-10-17 DIAGNOSIS — G8929 Other chronic pain: Secondary | ICD-10-CM

## 2013-10-17 DIAGNOSIS — R300 Dysuria: Secondary | ICD-10-CM

## 2013-10-17 DIAGNOSIS — R5381 Other malaise: Secondary | ICD-10-CM

## 2013-10-17 NOTE — Progress Notes (Signed)
Pre visit review using our clinic review tool, if applicable. No additional management support is needed unless otherwise documented below in the visit note. 

## 2013-10-21 ENCOUNTER — Encounter: Payer: Self-pay | Admitting: Internal Medicine

## 2013-10-21 DIAGNOSIS — R300 Dysuria: Secondary | ICD-10-CM | POA: Insufficient documentation

## 2013-10-21 NOTE — Assessment & Plan Note (Signed)
Bowels stable.  Per report, up to date with colonoscopy.

## 2013-10-21 NOTE — Assessment & Plan Note (Signed)
Still with some fatigue.  Check routine labs.  Follow.  Continue exercise.

## 2013-10-21 NOTE — Assessment & Plan Note (Signed)
Currently stable.  Recently evaluated by cardiology.  Had ECHO, stress test and EKG. States everything checked out fine.  Continue risk factor modification.

## 2013-10-21 NOTE — Assessment & Plan Note (Signed)
Reflux controlled on Protonix.  Follow.

## 2013-10-21 NOTE — Assessment & Plan Note (Signed)
On thyroid replacement.  Follow tsh.  

## 2013-10-21 NOTE — Assessment & Plan Note (Signed)
Recent biopsy revealed DCIS.  S/p surgery (Dr Pat Patrick).  Saw Dr Ma Hillock.  Declined further treatment.  Completed XRT (Dr Donella Stade).  Just recently reevaluated by Dr Ma Hillock.  Declines tamoxifen.  They are still scheduling her mammograms.  Keep f/u appt.

## 2013-10-21 NOTE — Assessment & Plan Note (Signed)
Blood pressure doing well.  Same medication regimen.  Follow metabolic panel.   

## 2013-10-21 NOTE — Assessment & Plan Note (Signed)
On Crestor.  Continue low cholesterol diet and exercise.  Follow lipid panel and liver function.

## 2013-10-21 NOTE — Progress Notes (Signed)
Subjective:    Patient ID: Laurie Hale, female    DOB: 1941-02-14, 73 y.o.   MRN: [id number]  HPI 73 year old female with past history of CAD followed by Dr Nehemiah Massed, hypercholesterolemia, hypertension, GERD and diverticulosis.  She comes in today for a scheduled follow up.   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  S/p surgery.  Seeing Dr Pat Patrick and Dr Ma Hillock.  She declined treatment through oncology.  Declined tamoxifen.  She saw Dr Donella Stade for XRT.  Has completed her treatment.  Still with some fatigue.   Is s/p TKR in 11/13 (Dr Lorre Nick).  Has done well.  Still doing exercises at home.  Still seeing Dr Hardin Negus for her back.  Still with pain, but stable on current medication regimen.  She feels her heart is stable. Had a stress test, echo and EKG recently.   States everything checked out fine.  Eating and drinking well.  Bowels stable.  Taking miralax.  No nausea or vomiting.  She does report noticing some discomfort with urination.  Some vaginal discharge.  Previous hematuria.  This has resolved.  Is s/p hysterectomy.  No abdominal pain.  Had persistent lesion - right nares.  Was seeing Dr Tyler Deis.  Due to follow up with Dr Evorn Gong tomorrow.       Past Medical History  Diagnosis Date  . Hypertension     takes Amlodipine  and Lisinopril daily  . Hyperlipidemia     takes Crestor and Niacin daily  . Coronary artery disease   . History of migraine     hasn't had one in yrs-per pt Zoloft stopped them  . Arthritis   . Joint pain   . Joint swelling   . Chronic back pain   . GERD (gastroesophageal reflux disease)     takes a med daily-to bring with med name at surgery  . Constipation   . Diverticulosis   . Peripheral edema     takes HCTZ daily  . Hypokalemia     takes K Dur tid  . Presence of pessary   . Hypothyroidism     takes Synthroid daily  . Cataract     immature bilateral  . Insomnia     takes restoril prn    Outpatient Encounter Prescriptions as of  10/17/2013  Medication Sig  . amLODipine (NORVASC) 5 MG tablet Take 1 tablet (5 mg total) by mouth daily.  Marland Kitchen gabapentin (NEURONTIN) 300 MG capsule Take 1 capsule (300 mg total) by mouth daily.  . hydrochlorothiazide (HYDRODIURIL) 25 MG tablet Take 1 tablet (25 mg total) by mouth daily.  Marland Kitchen levothyroxine (SYNTHROID, LEVOTHROID) 75 MCG tablet Take 1 tablet (75 mcg total) by mouth daily.  Marland Kitchen lisinopril (PRINIVIL,ZESTRIL) 40 MG tablet Take 1 tablet (40 mg total) by mouth daily.  . meloxicam (MOBIC) 7.5 MG tablet Take 1 tablet (7.5 mg total) by mouth daily.  Marland Kitchen morphine (MS CONTIN) 60 MG 12 hr tablet Take 60 mg by mouth every 8 (eight) hours as needed. For pain  . pantoprazole (PROTONIX) 40 MG tablet Take 1 tablet (40 mg total) by mouth daily.  . potassium chloride (K-DUR) 10 MEQ tablet Take 1 tablet (10 mEq total) by mouth 3 (three) times daily.  . rosuvastatin (CRESTOR) 5 MG tablet Take 5 mg by mouth daily.  . sertraline (ZOLOFT) 100 MG tablet Take 1 tablet (100 mg total) by mouth daily.    Review of Systems Patient denies any headache, lightheadedness or dizziness.  No sinus or allergy symptoms.  No chest pain, tightness or palpitations.  No increased shortness of breath, cough or congestion.  No nausea or vomiting.  No acid reflux.  No abdominal pain or cramping.  No bowel change, such as diarrhea, constipation, BRBPR or melana.  Miralax helping.  Urinary and vaginal symptoms as outlined.   Still with back pain.  Stable.  Knee doing better.  S/p breast surgery.  Completed XRT.  Increased fatigue persists.       Objective:   Physical Exam  Filed Vitals:   10/17/13 1447  BP: 120/70  Pulse: 60  Temp: 98 F (36.7 C)   Blood pressure recheck:  72/67  73 year old female in no acute distress.   HEENT:  Nares- clear.  Oropharynx - without lesions. NECK:  Supple.  Nontender.  No audible bruit.  HEART:  Appears to be regular. LUNGS:  No crackles or wheezing audible.  Respirations even and  unlabored.  RADIAL PULSE:  Equal bilaterally.    BREASTS:  No nipple discharge or nipple retraction present.  Could not appreciate any distinct nodules or axillary adenopathy.  ABDOMEN:  Soft, nontender.  Bowel sounds present and normal.  No audible abdominal bruit.  GU:  Not performed.     EXTREMITIES:  No increased edema present.  DP pulses palpable and equal bilaterally.         Assessment & Plan:  HEALTH MAINTENANCE.  Physical today.   Seeing Dr Donella Stade and Dr Ma Hillock for her breast cancer.  Completed XRT.

## 2013-10-21 NOTE — Assessment & Plan Note (Signed)
Last potassium check wnl.  Follow.

## 2013-10-21 NOTE — Assessment & Plan Note (Signed)
Followed by Dr Hardin Negus.  Stable.

## 2013-10-21 NOTE — Assessment & Plan Note (Signed)
Burning with urination.  Some vaginal discharge.  Check urinalysis to confirm if infection.  Offered to do a pelvic exam.  She declines.  Await urine results.

## 2013-10-21 NOTE — Assessment & Plan Note (Signed)
Persistent nasal lesion and skin changes.  Seeing Dr Evorn Gong.

## 2013-10-22 ENCOUNTER — Encounter: Payer: Self-pay | Admitting: Internal Medicine

## 2013-10-22 LAB — URINALYSIS, ROUTINE W REFLEX MICROSCOPIC
Bilirubin Urine: NEGATIVE
Hgb urine dipstick: NEGATIVE
Ketones, ur: NEGATIVE
Nitrite: NEGATIVE
Specific Gravity, Urine: 1.025
Total Protein, Urine: NEGATIVE
Urine Glucose: NEGATIVE
Urobilinogen, UA: 0.2
pH: 5 (ref 5.0–8.0)

## 2013-10-24 LAB — CULTURE, URINE COMPREHENSIVE: Colony Count: 15000

## 2013-12-02 ENCOUNTER — Ambulatory Visit: Payer: Self-pay | Admitting: Radiation Oncology

## 2013-12-05 ENCOUNTER — Ambulatory Visit: Payer: Medicare HMO | Admitting: Internal Medicine

## 2014-01-20 ENCOUNTER — Other Ambulatory Visit: Payer: Self-pay | Admitting: Internal Medicine

## 2014-01-31 ENCOUNTER — Other Ambulatory Visit: Payer: Self-pay | Admitting: *Deleted

## 2014-01-31 MED ORDER — LISINOPRIL 40 MG PO TABS: 40.0000 mg | ORAL_TABLET | Freq: Every day | ORAL | Status: DC

## 2014-02-10 ENCOUNTER — Encounter: Payer: Self-pay | Admitting: Nurse Practitioner

## 2014-02-10 ENCOUNTER — Ambulatory Visit (INDEPENDENT_AMBULATORY_CARE_PROVIDER_SITE_OTHER): Payer: Commercial Managed Care - HMO | Admitting: Nurse Practitioner

## 2014-02-10 VITALS — BP 136/64 | HR 59 | Temp 97.4°F | Resp 12 | Ht 59.5 in | Wt 178.1 lb

## 2014-02-10 DIAGNOSIS — R1084 Generalized abdominal pain: Secondary | ICD-10-CM

## 2014-02-10 DIAGNOSIS — K5730 Diverticulosis of large intestine without perforation or abscess without bleeding: Secondary | ICD-10-CM

## 2014-02-10 MED ORDER — METRONIDAZOLE 500 MG PO TABS: 500.0000 mg | ORAL_TABLET | Freq: Three times a day (TID) | ORAL | Status: DC

## 2014-02-10 MED ORDER — SULFAMETHOXAZOLE-TRIMETHOPRIM 800-160 MG PO TABS: 1.0000 | ORAL_TABLET | Freq: Two times a day (BID) | ORAL | Status: DC

## 2014-02-10 NOTE — Progress Notes (Signed)
Subjective:    Patient ID: Laurie Hale, female    DOB: [birth year], 73 y.o.   MRN: [id number]  HPI  Laurie Hale is a 73 yo female with a CC of back pain, onset 1 week with no trauma.   1) Pt on gabapentin 300 mg every other day- no difference. Meloxicam 7.5 mg daily, Sees Dr. Hardin Negus. Pt Fused from T11 to S1. Last visit was a month ago with Dr. Hardin Negus, has not done back exercises. PT in past 2 years ago- was not helpful. Up and moving around.    1 week, nauseated, epigastric pain to back, not radiating to back or shoulders, when getting up she feels light-headed. Constant pain 8/10 at worst 3/10 at best, "Sometimes painful" "just hurts" Tums- not helpful  Nexium- daily  dDiet- Daughter's house over Christmas. Nothing made stomach pain worse. Ate nuts over Christmas.  HA over night time, wakes pt up, cold rag helps, throbbing,  Colonoscopy 5 years ago.   Review of Systems  Constitutional: Positive for diaphoresis, appetite change and fatigue. Negative for fever and chills.       Not as hungry, more sweating at night  HENT: Positive for ear pain. Negative for congestion, sinus pressure, sneezing, sore throat and trouble swallowing.        Ear pain on left, resolved now  Eyes: Negative for visual disturbance.  Respiratory: Negative for cough, chest tightness, shortness of breath and wheezing.   Cardiovascular: Negative for chest pain, palpitations and leg swelling.  Gastrointestinal: Positive for diarrhea. Negative for nausea, vomiting and blood in stool.       Loose stools  Genitourinary: Negative for dysuria.  Musculoskeletal: Positive for back pain.       Uses cane  Skin: Negative for rash.  Neurological: Positive for light-headedness and headaches.       Headache over left eye  Hematological: Does not bruise/bleed easily.   Past Medical History  Diagnosis Date  . Hypertension     takes Amlodipine  and Lisinopril daily  . Hyperlipidemia     takes Crestor and Niacin daily  .  Coronary artery disease   . History of migraine     hasn't had one in yrs-per pt Zoloft stopped them  . Arthritis   . Joint pain   . Joint swelling   . Chronic back pain   . GERD (gastroesophageal reflux disease)     takes a med daily-to bring with med name at surgery  . Constipation   . Diverticulosis   . Peripheral edema     takes HCTZ daily  . Hypokalemia     takes K Dur tid  . Presence of pessary   . Hypothyroidism     takes Synthroid daily  . Cataract     immature bilateral  . Insomnia     takes restoril prn    History   Social History  . Marital Status: Married    Spouse Name: N/A    Number of Children: N/A  . Years of Education: N/A   Occupational History  . Not on file.   Social History Main Topics  . Smoking status: Never Smoker   . Smokeless tobacco: Never Used  . Alcohol Use: No  . Drug Use: No  . Sexual Activity: Not Currently   Other Topics Concern  . Not on file   Social History Narrative    Past Surgical History  Procedure Laterality Date  . Partial coloectomy  1995  .  Back surgery  2009  . Right knee arthroscopy  2013  . Tonsillectomy    . Cardiac catheterization  1999  . Coronary angioplasty      1 stent  . Colonoscopy    . 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 Total knee arthroplasty  01/02/2012    Procedure: TOTAL KNEE ARTHROPLASTY;  Surgeon: Vickey Huger, MD;  Location: [facility];  Service: Orthopedics;  Laterality: Right;    Family History  Problem Relation Age of Onset  . Hypertension Mother   . Sudden death Sister     x2  . Heart disease Brother     Allergies  Allergen Reactions  . Ciprofloxacin   . Penicillins     Hives     Current Outpatient Prescriptions on File Prior to Visit  Medication Sig Dispense Refill  . amLODipine (NORVASC) 5 MG tablet Take 1 tablet (5 mg total) by mouth daily. 90 tablet 1  . gabapentin (NEURONTIN) 300 MG capsule Take 1 capsule (300 mg total) by mouth daily. 90 capsule 3  .  hydrochlorothiazide (HYDRODIURIL) 25 MG tablet Take 1 tablet (25 mg total) by mouth daily. 90 tablet 1  . levothyroxine (SYNTHROID, LEVOTHROID) 75 MCG tablet TAKE ONE (1) TABLET EACH DAY 30 tablet 4  . lisinopril (PRINIVIL,ZESTRIL) 40 MG tablet Take 1 tablet (40 mg total) by mouth daily. 30 tablet 0  . meloxicam (MOBIC) 7.5 MG tablet Take 1 tablet (7.5 mg total) by mouth daily. 90 tablet 1  . morphine (MS CONTIN) 60 MG 12 hr tablet Take 60 mg by mouth every 8 (eight) hours as needed. For pain    . pantoprazole (PROTONIX) 40 MG tablet Take 1 tablet (40 mg total) by mouth daily. 90 tablet 1  . potassium chloride (K-DUR) 10 MEQ tablet Take 1 tablet (10 mEq total) by mouth 3 (three) times daily. 270 tablet 1  . rosuvastatin (CRESTOR) 5 MG tablet Take 5 mg by mouth daily.    . sertraline (ZOLOFT) 100 MG tablet Take 1 tablet (100 mg total) by mouth daily. 90 tablet 3   No current facility-administered medications on file prior to visit.       Objective:   Physical Exam  Constitutional: She is oriented to person, place, and time. She appears well-developed and well-nourished. No distress.  HENT:  Head: Normocephalic and atraumatic.  Right Ear: External ear normal.  Left Ear: External ear normal.  Cardiovascular: Normal rate and regular rhythm.   Pulmonary/Chest: Breath sounds normal.  Abdominal: Soft. She exhibits no distension and no mass. There is tenderness. There is no rebound and no guarding.  Bowel sounds hyperactive, normal pitched in all four quadrants, tenderness in general abdomen peri-umbilical.   Neurological: She is alert and oriented to person, place, and time.  Skin: Skin is warm and dry. No rash noted. She is not diaphoretic.  Psychiatric: She has a normal mood and affect. Her behavior is normal. Judgment and thought content normal.     BP 136/64 mmHg  Pulse 59  Temp(Src) 97.4 F (36.3 C) (Oral)  Resp 12  Ht 4' 11.5" (1.511 m)  Wt 178 lb 1.9 oz (80.795 kg)  BMI 35.39  kg/m2  SpO2 97%      Assessment & Plan:

## 2014-02-10 NOTE — Assessment & Plan Note (Addendum)
Pt having general abdominal pain x 1 week. Will treat for possible diverticulitis. Flagyl and Bactrim DS x 7 days prescribed. Gave pt handout and reviewed clear liquid diet, home care, and when to seek care/emergency help. Pt instructed to call back in 72 hours if not improving. Will get CT if not improved. FU 4 weeks. Obtain CBC w/ diff.

## 2014-02-10 NOTE — Patient Instructions (Signed)
Please visit the lab before leaving today.   I have called in 2 antibiotics for you to take for diverticulitis.  Bactrim- DS and Flagyl. Please take both as prescribed.   If you are not feeling better in 72 hours. Please call our office and we can set you up for imaging of your abdomen.   Diverticulitis Diverticulitis is inflammation or infection of small pouches in your colon that form when you have a condition called diverticulosis. The pouches in your colon are called diverticula. Your colon, or large intestine, is where water is absorbed and stool is formed. Complications of diverticulitis can include:  Bleeding.  Severe infection.  Severe pain.  Perforation of your colon.  Obstruction of your colon. CAUSES  Diverticulitis is caused by bacteria. Diverticulitis happens when stool becomes trapped in diverticula. This allows bacteria to grow in the diverticula, which can lead to inflammation and infection. RISK FACTORS People with diverticulosis are at risk for diverticulitis. Eating a diet that does not include enough fiber from fruits and vegetables may make diverticulitis more likely to develop. SYMPTOMS  Symptoms of diverticulitis may include:  Abdominal pain and tenderness. The pain is normally located on the left side of the abdomen, but may occur in other areas.  Fever and chills.  Bloating.  Cramping.  Nausea.  Vomiting.  Constipation.  Diarrhea.  Blood in your stool. DIAGNOSIS  Your health care provider will ask you about your medical history and do a physical exam. You may need to have tests done because many medical conditions can cause the same symptoms as diverticulitis. Tests may include:  Blood tests.  Urine tests.  Imaging tests of the abdomen, including X-rays and CT scans. When your condition is under control, your health care provider may recommend that you have a colonoscopy. A colonoscopy can show how severe your diverticula are and whether  something else is causing your symptoms. TREATMENT  Most cases of diverticulitis are mild and can be treated at home. Treatment may include:  Taking over-the-counter pain medicines.  Following a clear liquid diet.  Taking antibiotic medicines by mouth for 7-10 days. More severe cases may be treated at a hospital. Treatment may include:  Not eating or drinking.  Taking prescription pain medicine.  Receiving antibiotic medicines through an IV tube.  Receiving fluids and nutrition through an IV tube.  Surgery. HOME CARE INSTRUCTIONS   Follow your health care provider's instructions carefully.  Follow a full liquid diet or other diet as directed by your health care provider. After your symptoms improve, your health care provider may tell you to change your diet. He or she may recommend you eat a high-fiber diet. Fruits and vegetables are good sources of fiber. Fiber makes it easier to pass stool.  Take fiber supplements or probiotics as directed by your health care provider.  Only take medicines as directed by your health care provider.  Keep all your follow-up appointments. SEEK MEDICAL CARE IF:   Your pain does not improve.  You have a hard time eating food.  Your bowel movements do not return to normal. SEEK IMMEDIATE MEDICAL CARE IF:   Your pain becomes worse.  Your symptoms do not get better.  Your symptoms suddenly get worse.  You have a fever.  You have repeated vomiting.  You have bloody or black, tarry stools. MAKE SURE YOU:   Understand these instructions.  Will watch your condition.  Will get help right away if you are not doing well or get  worse. Document Released: 11/10/2004 Document Revised: 02/05/2013 Document Reviewed: 12/26/2012 [facility] Patient Information 2015 [place], [place]. This information is not intended to replace advice given to you by your health care provider. Make sure you discuss any questions you have with your health care  provider.

## 2014-02-10 NOTE — Progress Notes (Signed)
Pre visit review using our clinic review tool, if applicable. No additional management support is needed unless otherwise documented below in the visit note. 

## 2014-02-12 ENCOUNTER — Telehealth: Payer: Self-pay

## 2014-02-12 NOTE — Telephone Encounter (Signed)
The patient called hoping to have a medication called in stomach pain, nausea and vomiting.

## 2014-02-12 NOTE — Telephone Encounter (Signed)
Called patient about c/o nausea and vomiting. Patient states, "Nausea started yesterday, vomiting all night long, not able to continue with previously ordered medication, eat or drink."  Denies fever and abdominal pain is unchanged from last office visit.

## 2014-02-12 NOTE — Telephone Encounter (Signed)
Need more information. Was this call sent to triage nurse? How long has she been sick? Fever, chills?

## 2014-02-12 NOTE — Telephone Encounter (Signed)
Noted  

## 2014-02-12 NOTE — Telephone Encounter (Signed)
If having nausea, vomiting and not eating or drinking - needs evaluation (especially given her past history).  To acute care or [facility] for evaluation.

## 2014-02-17 ENCOUNTER — Other Ambulatory Visit: Payer: Self-pay | Admitting: Internal Medicine

## 2014-02-17 NOTE — Telephone Encounter (Signed)
Pt request refill on Meloxican 7.5 mg, and Hydrochlorothiazide 25 mg called to [facility] mail in. Please advise.msn

## 2014-02-18 MED ORDER — HYDROCHLOROTHIAZIDE 25 MG PO TABS: 25.0000 mg | ORAL_TABLET | Freq: Every day | ORAL | Status: DC

## 2014-02-18 MED ORDER — MELOXICAM 7.5 MG PO TABS: 7.5000 mg | ORAL_TABLET | Freq: Every day | ORAL | Status: DC

## 2014-02-18 NOTE — Telephone Encounter (Signed)
Spoke with patient, states GI issues have resolved.

## 2014-02-18 NOTE — Telephone Encounter (Signed)
Refilled meloxicam #90 with no refills.

## 2014-02-18 NOTE — Telephone Encounter (Signed)
HCTZ sent to pharmacy. Ok refill Meloxicam?

## 2014-02-18 NOTE — Telephone Encounter (Signed)
Regarding the request for meloxicam, the last phone message - pt reported having issues with her stomach.  Need to make sure doing better.  If still having issues, I am hesitant to refill the meloxicam - this can aggravate stomach/GI issues.

## 2014-03-06 ENCOUNTER — Telehealth: Payer: Self-pay | Admitting: Internal Medicine

## 2014-03-06 MED ORDER — POTASSIUM CHLORIDE ER 10 MEQ PO TBCR: 10.0000 meq | EXTENDED_RELEASE_TABLET | Freq: Three times a day (TID) | ORAL | Status: DC

## 2014-03-06 NOTE — Telephone Encounter (Signed)
potassium chloride (K-DUR) 10 MEQ tablet #90

## 2014-03-06 NOTE — Telephone Encounter (Signed)
Rx sent to pharmacy by escript  

## 2014-03-10 ENCOUNTER — Ambulatory Visit: Payer: Medicare HMO | Admitting: Nurse Practitioner

## 2014-06-06 NOTE — Consult Note (Signed)
Reason for Visit: This 74 year old Female patient presents to the clinic for initial evaluation of  breast cancer .   Referred by Dr. Pat Patrick.  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 To be on tamoxifen now for adjuvant radiation therapy to her left breast.  Pathology Report pathology report reviewed   Imaging Report mammograms reviewed   Referral Report clinical notes reviewed   Planned Treatment Regimen adjuvant whole breast radiationplus tamoxifen   HPI   patient is a 74 year old female with multiple comorbidities who presents with an abnormal mammogram in August of 2014 mammogram demonstrated at least [place] groups of slightly suspicious calcifications in the left upper outer quadrantof the left breast.she underwent stereotactic-assisted biopsy positive for ER PR strongly positive ductal carcinoma in situ. Went on to have a wide local excision and sentinel node biopsy for 0.4 cm region of low-grade ductal carcinoma in situ margins clear. One sentinel lymph node was negative. 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 Patient tolerated her surgery well. She's been seen by Dr. Ma Hillock will be a candidate for tamoxifen therapy after completion of radiation. She seen today in doing well she states she's having some postoperative incisional pain which is minor otherwise she is without complaint.  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   Past Medical History Comments chronic back pain, hypokalemia   Family History positive   Family History Comments family history of heart disease one sister with breast cancer also family history of hypertension   Social History noncontributory   Additional Past Medical and Surgical History accompanied by her husband today   Allergies:   Penicillin: Rash  Home Meds:  Home Medications: Medication Instructions Status  Zoloft 100 mg oral tablet 1 tab(s) orally once a day (in the morning) Active  meloxicam 7.5 mg oral tablet 1 tab(s) orally once a day Active  Crestor 5 mg oral tablet 1 tab(s) orally every other day Active  hydrochlorothiazide 25 mg oral tablet 1 tab(s) orally once a day (in the morning) Active  pantoprazole 40 mg oral delayed release tablet 1 tab(s) orally 2 times a day Active  potassium chloride 10 mEq oral tablet, extended release 2 tab(s) orally once a day (in the morning) and one tablet at bedtime Active  gabapentin 300 mg oral capsule 1 cap(s) orally once a day (at bedtime) Active  lisinopril 40 mg oral tablet 1 tab(s) orally once a day (in the morning) Active  amlodipine 5 mg oral tablet 1 tab(s) orally once a day (in the morning) Active  morphine sulfate 15 mg oral tablet 1 tab(s) orally 2 times a day, As Needed Active  Synthroid 75 mcg (0.075 mg) oral tablet 1 tab(s) orally once a day Active   Review of Systems:  General negative   Performance Status (ECOG) 0   Skin negative   Breast see HPI   Ophthalmologic negative   ENMT negative  Respiratory and Thorax negative   Cardiovascular negative   Gastrointestinal negative   Genitourinary negative   Musculoskeletal negative   Neurological negative   Psychiatric negative   Hematology/Lymphatics negative   Endocrine negative   Allergic/Immunologic negative   Review of Systems   review of systems obtained from  nurse's notes  Nursing Notes:  Nursing Vital Signs and Chemo Nursing Nursing Notes: *CC Vital Signs Flowsheet:   26-Nov-14 10:33  Temp Temperature 96.7  Pulse Pulse 59  Respirations Respirations 20  SBP SBP 134  DBP DBP 70  Pain Scale (0-10)  7 Lower Back  Current Weight (kg) (kg) 75.5  Height (cm) centimeters 152.4  BSA (m2) 1.7   Physical Exam:  General/Skin/HEENT:  General normal   Skin normal   Eyes normal   ENMT normal   Head and Neck normal   Additional PE well-developed female in NAD. Left breast is start status post a horizontal incision in the lateral aspect of the breast at the 3:00 position which is healing well. No dominant mass or nodularity is noted in either breast into position examined. No axillary or supraclavicular adenopathy is appreciated.   Breasts/Resp/CV/GI/GU:  Respiratory and Thorax normal   Cardiovascular normal   Gastrointestinal normal   Genitourinary normal   MS/Neuro/Psych/Lymph:  Musculoskeletal normal   Neurological normal   Lymphatics normal   Other Results:  Radiology Results: LabUnknown:    26-Aug-14 14:23, Digital Additional Views Lt Breast [facility]  PACS Image   [facility]:  Digital Additional Views Lt Breast (SCR)   REASON FOR EXAM:    av lt calcification  COMMENTS:       PROCEDURE: MAM - MAM DGTL ADD VW LT  SCR  - Oct 09 2012  2:23PM     *RADIOLOGY REPORT*    Clinical Data:  The patient returns for evaluation of  calcifications in the left upper outer quadrant noted on recent  screening mammogram dated 10/02/2012.    DIGITAL DIAGNOSTIC LEFT MAMMOGRAM    Comparison: 10/20/2010, 04/15/2008  Findings:    ACR Breast Density Category b:  There are scattered areas of  fibroglandular density.    Magnification views demonstrate at least three groups of  calcifications in the left upper outer quadrant.  These vary in  size and shape. The appearance is slightly suspicious and biopsy is  suggested.   Stereotactic core needle biopsy was discussed with the  patient.    Mammographic images were processed with CAD.     IMPRESSION:  At least three adjacent groups of slightly suspicious  calcifications in the left upper outer quadrant.    RECOMMENDATION:  Stereotactic biopsy of one of these clusters is suggested.  This  will be scheduled at a convenient time for the patient.    I have discussed the findings and recommendations with the patient.  Results were also provided in writing at the conclusion of the  visit.  If applicable, a reminder letter will be sent to the  patient regarding her next appointment.    BI-RADS CATEGORY 4:  Suspicious abnormality - biopsy should be  considered.    Original Report Authenticated By: Ulyess Blossom, M.D.         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 Cannot use hyperfractionated regimen based on the patient's large breast size. Would plan on delivering 5000 cGy over 5 weeks and boost to her scar another 1400 cGy using electron beam. Risks and benefits of treatment including skin reaction, fatigue, possible occlusion of some superficial lung, and alterationlood counts oral were explained in detail to the patient and her husband. They both seem to comprehend my treatment plan well. I have set her up for CT simulation next week. Patient will also be a candidate for tamoxifen therapy after completion of radiation and will have Dr. Ma Hillock prescribed that.  I would like to take this opportunity to thank you for allowing me to continue to participate in this patient's care.  CC Referral:  cc: Dr. Pat Patrick, Dr. Einar Pheasant   Electronic  Signatures: Baruch Gouty, Roda Shutters (MD)  (Signed [phone] 11:35)  Authored: HPI, Diagnosis, Past Hx, PFSH, Allergies, Home Meds, ROS, Nursing Notes, Physical Exam, Other Results, Relevent Results, Encounter Assessment and Plan, CC Referring Physician   Last Updated: 26-Nov-14 11:35 by Armstead Peaks (MD)

## 2014-06-06 NOTE — Op Note (Signed)
PATIENT NAME:  Laurie Hale, STORBECK MR#:  [id number] DATE OF BIRTH:  January 24, 1941  DATE OF PROCEDURE:  11/29/2012  PREOPERATIVE DIAGNOSIS: Left breast carcinoma in situ.   POSTOPERATIVE DIAGNOSIS: Left breast carcinoma in situ.   OPERATION: Left partial mastectomy with sentinel lymph node biopsy.   SURGEON: Rodena Goldmann, MD.   OPERATIVE PROCEDURE: With the patient in the supine position after induction of appropriate general anesthesia, the patient's right breast, chest, and axilla were prepped with ChloraPrep. The axillary area was draped with sterile towels. The axilla was then interrogated  with the Neoprobe and an area of significant radioactivity identified.   An incision was made in that area and carried down through the subcutaneous tissue. Sentinel lymph node was identified with counts of approximately 900 and background counts less than 10. The node was removed and sent for touch prep which does not identify any evidence of macrometastasis. The area was closed with a running subcuticular suture of 3-0 Vicryl and benzoin and Steri-Strips applied at the end of the procedure.   An elliptical incision was then made around the area identified by the wire and carried down to the chest wall encompassing the entire lesion as identified by the wire. The specimen was swept off the chest wall laterally to the latissimus dorsi muscle. The specimen was sent for mammography which did reveal the presence of the microcalcifications and the clip. The area was irrigated. The drain was placed into the large defect using a 7 mm flat Jackson-Pratt drain through a separate stab wound, skin was closed with vertical mattress sutures of 4-0 nylon. Compressive dressing was applied. The drain was secured with 3-0 nylon.   The wounds were dressed sterilely with compressive dressing. The patient returned to the recovery room having tolerated the procedure well. Sponge, instrument and needle counts were correct x2 in the Operating  Room.    ____________________________ Rodena Goldmann III, MD rle:np D: 11/29/2012 14:46:07 ET T: 11/29/2012 20:27:27 ET JOB#: [id number]  cc: Rodena Goldmann III, MD, <Dictator> Einar Pheasant, MD  Rodena Goldmann MD ELECTRONICALLY SIGNED 12/01/2012 17:49

## 2014-06-08 NOTE — Op Note (Signed)
PATIENT NAME:  Laurie Hale, Laurie Hale MR#:  [id number] DATE OF BIRTH:  [birth year]  DATE OF PROCEDURE:  05/27/2011  PREOPERATIVE DIAGNOSIS: Internal derangement of the right knee.   POSTOPERATIVE DIAGNOSES:  1. Tear of the anterior and posterior horns of the lateral meniscus, right knee.  2. 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   PROCEDURES PERFORMED:  1. Right knee arthroscopy.  2. Partial lateral meniscectomy.  3. Lateral chondroplasty.   SURGEON: Laurice Record. Holley Bouche., MD  ANESTHESIA: General.   ESTIMATED BLOOD LOSS: Minimal.   TOURNIQUET TIME: Not used.   DRAINS: None.   INDICATIONS FOR SURGERY: The patient is a 74 year old female who has been seen for complaints of persistent right knee pain. MRI demonstrated findings consistent with meniscal pathology. After discussion of risks and benefits of surgical intervention, patient expressed her understanding of the risks and benefits and agreed with plans for surgical intervention.   PROCEDURE IN DETAIL: Patient was brought into the Operating Room and, after adequate general anesthesia was achieved, tourniquet was placed on patient's right thigh and leg was placed in a leg holder. All bony prominences were well padded. Patient's right knee and leg were cleaned and prepped with alcohol and DuraPrep and draped in the usual sterile fashion. A "timeout" was performed as per usual protocol. The anticipated portal sites were injected with 0.25% Marcaine with epinephrine. An anterolateral portal was created and cannula was inserted. A moderate effusion was evacuated. Scope was inserted and the knee was distended with fluid using the DePuy Mitek pump. Scope was advanced down the medial gutter into the medial compartment of the knee. Under visualization with scope, an anteromedial portal was created and hook probe was inserted. Inspection of the medial compartment showed the medial meniscus to be intact. Articular surface was in good  condition. Scope was then advanced into the intercondylar region. Anterior cruciate ligament was visualized and probed and felt to stable. Scope was removed from the anterolateral portal and reinserted via the anteromedial portal so as to better visualize the lateral compartment. Complex tears of the posterior and anterior horns of the lateral meniscus were encountered. These were debrided using meniscal punches and a 4.5 mm shaver. Final contouring was performed using a 50 degrees ArthroCare wand. There were associated grade 3 changes of chondromalacia involving primarily the lateral femoral condyle. These areas were debrided and contoured using the ArthroCare wand. Finally, the scope was positioned so as to visualize the patellofemoral articulation. Some mild chondral changes were noted. Good patellar tracking was appreciated.   The knee was irrigated with copious amounts of fluid then suctioned dry. The anterolateral portal was reapproximated using 3-0 nylon. A combination of 0.25% Marcaine with epinephrine and 4 mg of morphine was injected via the scope. Scope was removed and the anteromedial portal was reapproximated using 3-0 nylon. A sterile dressing was applied followed by application of ice wrap.   Patient tolerated the procedure well. She was transported to the recovery room in stable condition.    ____________________________ Laurice Record. Holley Bouche., MD jph:cms D: 05/27/2011 23:55:10 ET T: 05/28/2011 15:50:39 ET JOB#: [id number]  cc: Jeneen Rinks P. Holley Bouche., MD, <Dictator> JAMES P Holley Bouche MD ELECTRONICALLY SIGNED 05/29/2011 20:25

## 2014-11-21 ENCOUNTER — Other Ambulatory Visit: Payer: Self-pay | Admitting: Otolaryngology

## 2014-11-21 DIAGNOSIS — E041 Nontoxic single thyroid nodule: Secondary | ICD-10-CM

## 2014-11-21 DIAGNOSIS — R42 Dizziness and giddiness: Secondary | ICD-10-CM

## 2014-11-28 ENCOUNTER — Ambulatory Visit
Admission: RE | Admit: 2014-11-28 | Discharge: 2014-11-28 | Disposition: A | Discharge status: Home / Self Care | Payer: Commercial Managed Care - HMO | Source: Ambulatory Visit | Attending: Otolaryngology | Admitting: Otolaryngology

## 2014-11-28 ENCOUNTER — Ambulatory Visit: Admission: RE | Admit: 2014-11-28 | Payer: Commercial Managed Care - HMO | Source: Ambulatory Visit

## 2014-11-28 DIAGNOSIS — E041 Nontoxic single thyroid nodule: Secondary | ICD-10-CM | POA: Insufficient documentation

## 2014-12-10 ENCOUNTER — Ambulatory Visit
Admission: RE | Admit: 2014-12-10 | Discharge: 2014-12-10 | Disposition: A | Discharge status: Home / Self Care | Payer: Commercial Managed Care - HMO | Source: Ambulatory Visit | Attending: Otolaryngology | Admitting: Otolaryngology

## 2014-12-10 DIAGNOSIS — H9209 Otalgia, unspecified ear: Secondary | ICD-10-CM | POA: Diagnosis present

## 2014-12-10 DIAGNOSIS — R42 Dizziness and giddiness: Secondary | ICD-10-CM | POA: Diagnosis not present

## 2014-12-10 MED ORDER — GADOBENATE DIMEGLUMINE 529 MG/ML IV SOLN
20.0000 mL | Freq: Once | INTRAVENOUS | Status: AC | PRN
  Administered 2014-12-10: 16 mL via INTRAVENOUS

## 2015-03-10 ENCOUNTER — Other Ambulatory Visit: Payer: Self-pay | Admitting: Physical Medicine and Rehabilitation

## 2015-03-10 DIAGNOSIS — M549 Dorsalgia, unspecified: Secondary | ICD-10-CM

## 2015-03-10 DIAGNOSIS — M47816 Spondylosis without myelopathy or radiculopathy, lumbar region: Secondary | ICD-10-CM

## 2015-04-07 ENCOUNTER — Ambulatory Visit
Admission: RE | Admit: 2015-04-07 | Discharge: 2015-04-07 | Disposition: A | Discharge status: Home / Self Care | Payer: Medicare Other | Source: Ambulatory Visit | Attending: Physical Medicine and Rehabilitation | Admitting: Physical Medicine and Rehabilitation

## 2015-04-07 DIAGNOSIS — Z981 Arthrodesis status: Secondary | ICD-10-CM | POA: Diagnosis not present

## 2015-04-07 DIAGNOSIS — M479 Spondylosis, unspecified: Secondary | ICD-10-CM | POA: Diagnosis not present

## 2015-04-07 DIAGNOSIS — M47816 Spondylosis without myelopathy or radiculopathy, lumbar region: Secondary | ICD-10-CM | POA: Diagnosis present

## 2015-04-07 DIAGNOSIS — M549 Dorsalgia, unspecified: Secondary | ICD-10-CM | POA: Insufficient documentation

## 2015-04-07 MED ORDER — GADOBENATE DIMEGLUMINE 529 MG/ML IV SOLN
15.0000 mL | Freq: Once | INTRAVENOUS | Status: AC | PRN
  Administered 2015-04-07: 15 mL via INTRAVENOUS

## 2015-08-03 ENCOUNTER — Telehealth: Payer: Self-pay | Admitting: *Deleted

## 2015-09-25 ENCOUNTER — Encounter: Payer: Self-pay | Admitting: *Deleted

## 2015-10-01 NOTE — Discharge Instructions (Signed)
General Anesthesia, Adult, Care After Refer to this sheet in the next few weeks. These instructions provide you with information on caring for yourself after your procedure. Your health care provider may also give you more specific instructions. Your treatment has been planned according to current medical practices, but problems sometimes occur. Call your health care provider if you have any problems or questions after your procedure. WHAT TO EXPECT AFTER THE PROCEDURE After the procedure, it is typical to experience:  Sleepiness.  Nausea and vomiting. HOME CARE INSTRUCTIONS  For the first 24 hours after general anesthesia:  Have a responsible person with you.  Do not drive a car. If you are alone, do not take public transportation.  Do not drink alcohol.  Do not take medicine that has not been prescribed by your health care provider.  Do not sign important papers or make important decisions.  You may resume a normal diet and activities as directed by your health care provider.  Change bandages (dressings) as directed.  If you have questions or problems that seem related to general anesthesia, call the hospital and ask for the anesthetist or anesthesiologist on call. SEEK MEDICAL CARE IF:  You have nausea and vomiting that continue the day after anesthesia.  You develop a rash. SEEK IMMEDIATE MEDICAL CARE IF:   You have difficulty breathing.  You have chest pain.  You have any allergic problems.   This information is not intended to replace advice given to you by your health care provider. Make sure you discuss any questions you have with your health care provider.   Document Released: 05/09/2000 Document Revised: 02/21/2014 Document Reviewed: 06/01/2011 Elsevier Interactive Patient Education 2016 [place] [facility], Care After Refer to this sheet in the next few weeks. These instructions provide you with information on caring for yourself after your  procedure. Your caregiver may also give you more specific instructions. Your treatment has been planned according to current medical practices, but problems sometimes occur. Call your caregiver if you have any problems or questions after your procedure.  HOME CARE INSTRUCTIONS   Avoid strenuous activities as directed by your caregiver.  Ask your caregiver when you can resume driving.  Use eyedrops or other medicines to help healing and control pressure inside your eye as directed by your caregiver.  Only take over-the-counter or prescription medicines for pain, discomfort, or fever as directed by your caregiver.  Do not to touch or rub your eyes.  You may be instructed to use a protective shield during the first few days and nights after surgery. If not, wear sunglasses to protect your eyes. This is to protect the eye from pressure or from being accidentally bumped.  Keep the area around your eye clean and dry. Avoid swimming or allowing water to hit you directly in the face while showering. Keep soap and shampoo out of your eyes.  Do not bend or lift heavy objects. Bending increases pressure in the eye. You can walk, climb stairs, and do light household chores.  Do not put a contact lens into the eye that had surgery until your caregiver says it is okay to do so.  Ask your doctor when you can return to work. This will depend on the kind of work that you do. If you work in a dusty environment, you may be advised to wear protective eyewear for a period of time.  Ask your caregiver when it will be safe to engage in sexual activity.  Continue with  your regular eye exams as directed by your caregiver. What to expect:  It is normal to feel itching and mild discomfort for a few days after cataract surgery. Some fluid discharge is also common, and your eye may be sensitive to light and touch.  After 1 to 2 days, even moderate discomfort should disappear. In most cases, healing will take about  6 weeks.  If you received an intraocular lens (IOL), you may notice that colors are very bright or have a blue tinge. Also, if you have been in bright sunlight, everything may appear reddish for a few hours. If you see these color tinges, it is because your lens is clear and no longer cloudy. Within a few months after receiving an IOL, these extra colors should go away. When you have healed, you will probably need new glasses. SEEK MEDICAL CARE IF:   You have increased bruising around your eye.  You have discomfort not helped by medicine. SEEK IMMEDIATE MEDICAL CARE IF:   You have a fever.  You have a worsening or sudden vision loss.  You have redness, swelling, or increasing pain in the eye.  You have a thick discharge from the eye that had surgery. MAKE SURE YOU:  Understand these instructions.  Will watch your condition.  Will get help right away if you are not doing well or get worse.   This information is not intended to replace advice given to you by your health care provider. Make sure you discuss any questions you have with your health care provider.   Document Released: 08/20/2004 Document Revised: 02/21/2014 Document Reviewed: 09/24/2010 Elsevier Interactive Patient Education [employer].

## 2015-10-05 ENCOUNTER — Encounter
Admission: RE | Disposition: A | Discharge status: Home / Self Care | Payer: Self-pay | Source: Ambulatory Visit | Attending: Ophthalmology

## 2015-10-05 ENCOUNTER — Encounter: Payer: Self-pay | Admitting: Anesthesiology

## 2015-10-05 ENCOUNTER — Ambulatory Visit: Payer: Medicare Other | Admitting: Anesthesiology

## 2015-10-05 ENCOUNTER — Ambulatory Visit
Admission: RE | Admit: 2015-10-05 | Discharge: 2015-10-05 | Disposition: A | Discharge status: Home / Self Care | Payer: Medicare Other | Source: Ambulatory Visit | Attending: Ophthalmology | Admitting: Ophthalmology

## 2015-10-05 DIAGNOSIS — M1990 Unspecified osteoarthritis, unspecified site: Secondary | ICD-10-CM | POA: Insufficient documentation

## 2015-10-05 DIAGNOSIS — H2511 Age-related nuclear cataract, right eye: Secondary | ICD-10-CM | POA: Insufficient documentation

## 2015-10-05 DIAGNOSIS — Z880 Allergy status to penicillin: Secondary | ICD-10-CM | POA: Insufficient documentation

## 2015-10-05 DIAGNOSIS — F419 Anxiety disorder, unspecified: Secondary | ICD-10-CM | POA: Insufficient documentation

## 2015-10-05 DIAGNOSIS — I2510 Atherosclerotic heart disease of native coronary artery without angina pectoris: Secondary | ICD-10-CM | POA: Insufficient documentation

## 2015-10-05 DIAGNOSIS — M545 Low back pain: Secondary | ICD-10-CM | POA: Diagnosis not present

## 2015-10-05 DIAGNOSIS — Z955 Presence of coronary angioplasty implant and graft: Secondary | ICD-10-CM | POA: Insufficient documentation

## 2015-10-05 DIAGNOSIS — R51 Headache: Secondary | ICD-10-CM | POA: Insufficient documentation

## 2015-10-05 DIAGNOSIS — E039 Hypothyroidism, unspecified: Secondary | ICD-10-CM | POA: Insufficient documentation

## 2015-10-05 DIAGNOSIS — G8929 Other chronic pain: Secondary | ICD-10-CM | POA: Insufficient documentation

## 2015-10-05 DIAGNOSIS — K5790 Diverticulosis of intestine, part unspecified, without perforation or abscess without bleeding: Secondary | ICD-10-CM | POA: Insufficient documentation

## 2015-10-05 DIAGNOSIS — Z791 Long term (current) use of non-steroidal anti-inflammatories (NSAID): Secondary | ICD-10-CM | POA: Diagnosis not present

## 2015-10-05 DIAGNOSIS — Z9889 Other specified postprocedural states: Secondary | ICD-10-CM | POA: Diagnosis not present

## 2015-10-05 DIAGNOSIS — Z9049 Acquired absence of other specified parts of digestive tract: Secondary | ICD-10-CM | POA: Insufficient documentation

## 2015-10-05 DIAGNOSIS — K219 Gastro-esophageal reflux disease without esophagitis: Secondary | ICD-10-CM | POA: Diagnosis not present

## 2015-10-05 DIAGNOSIS — F329 Major depressive disorder, single episode, unspecified: Secondary | ICD-10-CM | POA: Insufficient documentation

## 2015-10-05 DIAGNOSIS — Z853 Personal history of malignant neoplasm of breast: Secondary | ICD-10-CM | POA: Insufficient documentation

## 2015-10-05 DIAGNOSIS — Z85828 Personal history of other malignant neoplasm of skin: Secondary | ICD-10-CM | POA: Insufficient documentation

## 2015-10-05 DIAGNOSIS — Z79891 Long term (current) use of opiate analgesic: Secondary | ICD-10-CM | POA: Insufficient documentation

## 2015-10-05 DIAGNOSIS — I10 Essential (primary) hypertension: Secondary | ICD-10-CM | POA: Insufficient documentation

## 2015-10-05 DIAGNOSIS — Z79899 Other long term (current) drug therapy: Secondary | ICD-10-CM | POA: Insufficient documentation

## 2015-10-05 HISTORY — PX: CATARACT EXTRACTION W/PHACO: SHX586

## 2015-10-05 SURGERY — Surgical Case
Anesthesia: *Unknown

## 2015-10-05 SURGERY — PHACOEMULSIFICATION, CATARACT, WITH IOL INSERTION
Anesthesia: Monitor Anesthesia Care | Site: Eye | Laterality: Right | Wound class: Clean

## 2015-10-05 MED ORDER — EPINEPHRINE HCL 1 MG/ML IJ SOLN
INTRAOCULAR | Status: DC | PRN
  Administered 2015-10-05: 122 mL via OPHTHALMIC

## 2015-10-05 MED ORDER — MOXIFLOXACIN HCL 0.5 % OP SOLN
OPHTHALMIC | Status: DC | PRN
  Administered 2015-10-05: .3 [drp] via OPHTHALMIC

## 2015-10-05 MED ORDER — BALANCED SALT IO SOLN
INTRAOCULAR | Status: DC | PRN
  Administered 2015-10-05: 1 mL via OPHTHALMIC

## 2015-10-05 MED ORDER — MIDAZOLAM HCL 2 MG/2ML IJ SOLN
INTRAMUSCULAR | Status: DC | PRN
  Administered 2015-10-05: 2 mg via INTRAVENOUS

## 2015-10-05 MED ORDER — FENTANYL CITRATE (PF) 100 MCG/2ML IJ SOLN
INTRAMUSCULAR | Status: DC | PRN
  Administered 2015-10-05: 50 ug via INTRAVENOUS

## 2015-10-05 MED ORDER — TIMOLOL MALEATE 0.5 % OP SOLN
OPHTHALMIC | Status: DC | PRN
  Administered 2015-10-05: 1 [drp] via OPHTHALMIC

## 2015-10-05 MED ORDER — TETRACAINE HCL 0.5 % OP SOLN
1.0000 [drp] | OPHTHALMIC | Status: DC | PRN
  Administered 2015-10-05: 1 [drp] via OPHTHALMIC

## 2015-10-05 MED ORDER — LACTATED RINGERS IV SOLN: INTRAVENOUS | Status: DC

## 2015-10-05 MED ORDER — ARMC OPHTHALMIC DILATING GEL
1.0000 "application " | OPHTHALMIC | Status: DC | PRN
  Administered 2015-10-05 (×2): 1 via OPHTHALMIC

## 2015-10-05 MED ORDER — NA HYALUR & NA CHOND-NA HYALUR 0.4-0.35 ML IO KIT
PACK | INTRAOCULAR | Status: DC | PRN
  Administered 2015-10-05: 1 mL via INTRAOCULAR

## 2015-10-05 MED ORDER — BRIMONIDINE TARTRATE 0.2 % OP SOLN
OPHTHALMIC | Status: DC | PRN
  Administered 2015-10-05: 1 [drp] via OPHTHALMIC

## 2015-10-05 MED ORDER — POVIDONE-IODINE 5 % OP SOLN
1.0000 "application " | OPHTHALMIC | Status: DC | PRN
  Administered 2015-10-05: 1 via OPHTHALMIC

## 2015-10-05 SURGICAL SUPPLY — 30 items
APL FBRTP 3 NS LF CTTN WD (MISCELLANEOUS) ×1
APPLICATOR COTTON TIP 3IN (MISCELLANEOUS) ×3 IMPLANT
CANNULA ANT/CHMB 27GA (MISCELLANEOUS) ×3 IMPLANT
DISSECTOR HYDRO NUCLEUS 50X22 (MISCELLANEOUS) ×3 IMPLANT
GLOVE BIO SURGEON STRL SZ7 (GLOVE) ×6 IMPLANT
GLOVE SURG LX 6.5 MICRO (GLOVE) ×2
GLOVE SURG LX STRL 6.5 MICRO (GLOVE) ×1 IMPLANT
GOWN STRL REUS W/ TWL LRG LVL3 (GOWN DISPOSABLE) ×2 IMPLANT
GOWN STRL REUS W/TWL LRG LVL3 (GOWN DISPOSABLE) ×6
LENS IOL ACRSF IQ ULTRA 23.0 (Intraocular Lens) ×1 IMPLANT
LENS IOL ACRYSOF IQ 23.0 (Intraocular Lens) ×3 IMPLANT
MARKER SKIN DUAL TIP RULER LAB (MISCELLANEOUS) ×3 IMPLANT
NEEDLE FILTER BLUNT 18X 1/2SAF (NEEDLE) ×2
NEEDLE FILTER BLUNT 18X1 1/2 (NEEDLE) ×1 IMPLANT
PACK CATARACT BRASINGTON (MISCELLANEOUS) ×3 IMPLANT
PACK EYE AFTER SURG (MISCELLANEOUS) ×3 IMPLANT
PACK OPTHALMIC (MISCELLANEOUS) ×3 IMPLANT
RING MALYGIN 7.0 (MISCELLANEOUS) IMPLANT
SOL BAL SALT 15ML (MISCELLANEOUS)
SOLUTION BAL SALT 15ML (MISCELLANEOUS) IMPLANT
SUT ETHILON 10-0 CS-B-6CS-B-6 (SUTURE)
SUT VICRYL  9 0 (SUTURE)
SUT VICRYL 9 0 (SUTURE) IMPLANT
SUTURE EHLN 10-0 CS-B-6CS-B-6 (SUTURE) IMPLANT
SYR 3ML LL SCALE MARK (SYRINGE) ×3 IMPLANT
SYR TB 1ML LUER SLIP (SYRINGE) ×3 IMPLANT
WATER STERILE IRR 250ML POUR (IV SOLUTION) ×3 IMPLANT
WATER STERILE IRR 500ML POUR (IV SOLUTION) IMPLANT
WICK EYE OCUCEL (MISCELLANEOUS) IMPLANT
WIPE NON LINTING 3.25X3.25 (MISCELLANEOUS) ×3 IMPLANT

## 2015-10-05 NOTE — Transfer of Care (Signed)
Immediate Anesthesia Transfer of Care Note  Patient: Laurie Hale  Procedure(s) Performed: Procedure(s) with comments: CATARACT EXTRACTION PHACO AND INTRAOCULAR LENS PLACEMENT (IOC) (Right) - RIGHT  Patient Location: PACU  Anesthesia Type: MAC  Level of Consciousness: awake, alert  and patient cooperative  Airway and Oxygen Therapy: Patient Spontanous Breathing and Patient connected to supplemental oxygen  Post-op Assessment: Post-op Vital signs reviewed, Patient's Cardiovascular Status Stable, Respiratory Function Stable, Patent Airway and No signs of Nausea or vomiting  Post-op Vital Signs: Reviewed and stable  Complications: No apparent anesthesia complications

## 2015-10-05 NOTE — Op Note (Signed)
Date of Surgery: 10/05/2015  PREOPERATIVE DIAGNOSES: Visually significant nuclear sclerotic cataract, right eye.  POSTOPERATIVE DIAGNOSES: Same  PROCEDURES PERFORMED: Cataract extraction with intraocular lens implant, right eye.  SURGEON: Almon Hercules, M.D.  ANESTHESIA: MAC and topical  IMPLANTS: [id number] +23.0 D  Implant Name Type Inv. Item Serial No. Manufacturer Lot No. LRB No. Used  LENS IOL ACRYSOF IQ 23.0 - [id number] Intraocular Lens LENS IOL ACRYSOF IQ 23.0 [id number] ALCON   Right 1     COMPLICATIONS: None.  DESCRIPTION OF PROCEDURE: Therapeutic options were discussed with the patient preoperatively, including a discussion of risks and benefits of surgery. Informed consent was obtained. An IOL-Master and immersion biometry were used to take the lens measurements, and a dilated fundus exam was performed within 6 months of the surgical date.  The patient was premedicated and brought to the operating room and placed on the operating table in the supine position. After adequate anesthesia, the patient was prepped and draped in the usual sterile ophthalmic fashion. A wire lid speculum was inserted and the microscope was positioned. A Superblade was used to create a paracentesis site at the limbus and a small amount of dilute preservative free lidocaine was instilled into the anterior chamber, followed by dispersive viscoelastic. A clear corneal incision was created temporally using a 2.4 mm keratome blade. Capsulorrhexis was then performed. In situ phacoemulsification was performed.  Cortical material was removed with the irrigation-aspiration unit. Dispersive viscoelastic was instilled to open the capsular bag. A posterior chamber intraocular lens with the specifications above was inserted and positioned. Irrigation-aspiration was used to remove all viscoelastic. Vigamox 1cc was instilled into the anterior chamber, and the corneal incision was checked and found to be water tight. The  eyelid speculum was removed.  The operative eye was covered with protective goggles after instilling 1 drop of timolol and brimonidine. The patient tolerated the procedure well. There were no complications.

## 2015-10-05 NOTE — Anesthesia Postprocedure Evaluation (Signed)
Anesthesia Post Note  Patient: Laurie Hale  Procedure(s) Performed: Procedure(s) (LRB): CATARACT EXTRACTION PHACO AND INTRAOCULAR LENS PLACEMENT (IOC) (Right)  Patient location during evaluation: PACU Anesthesia Type: MAC Level of consciousness: awake and alert Pain management: pain level controlled Vital Signs Assessment: post-procedure vital signs reviewed and stable Respiratory status: spontaneous breathing, nonlabored ventilation, respiratory function stable and patient connected to nasal cannula oxygen Cardiovascular status: stable and blood pressure returned to baseline Anesthetic complications: no    Lindsea Olivar

## 2015-10-05 NOTE — H&P (Signed)
H+P reviewed and is up to date, please see paper chart.  

## 2015-10-05 NOTE — Anesthesia Preprocedure Evaluation (Signed)
Anesthesia Evaluation  Patient identified by MRN, date of birth, ID band  Reviewed: NPO status   History of Anesthesia Complications Negative for: history of anesthetic complications  Airway Mallampati: II  TM Distance: >3 FB Neck ROM: full    Dental no notable dental hx.    Pulmonary neg pulmonary ROS,    Pulmonary exam normal        Cardiovascular Exercise Tolerance: Good hypertension, + CAD (stent 1999)  Normal cardiovascular exam     Neuro/Psych  Headaches, Anxiety LBPain;  Chronic pain.    GI/Hepatic Neg liver ROS, GERD  Controlled,  Endo/Other  Hypothyroidism   Renal/GU negative Renal ROS  negative genitourinary   Musculoskeletal  (+) Arthritis ,   Abdominal   Peds  Hematology L breast cancer > lumpectomy    Anesthesia Other Findings   Reproductive/Obstetrics                             Anesthesia Physical Anesthesia Plan  ASA: III  Anesthesia Plan: MAC   Post-op Pain Management:    Induction:   Airway Management Planned:   Additional Equipment:   Intra-op Plan:   Post-operative Plan:   Informed Consent: I have reviewed the patients History and Physical, chart, labs and discussed the procedure including the risks, benefits and alternatives for the proposed anesthesia with the patient or authorized representative who has indicated his/her understanding and acceptance.     Plan Discussed with: CRNA  Anesthesia Plan Comments:         Anesthesia Quick Evaluation

## 2015-10-06 ENCOUNTER — Encounter: Payer: Self-pay | Admitting: Ophthalmology

## 2015-11-09 ENCOUNTER — Encounter: Payer: Self-pay | Admitting: *Deleted

## 2015-11-12 NOTE — Discharge Instructions (Signed)

## 2015-11-16 ENCOUNTER — Ambulatory Visit: Payer: Medicare Other | Admitting: Anesthesiology

## 2015-11-16 ENCOUNTER — Encounter
Admission: RE | Disposition: A | Discharge status: Home / Self Care | Payer: Self-pay | Source: Ambulatory Visit | Attending: Ophthalmology

## 2015-11-16 ENCOUNTER — Ambulatory Visit
Admission: RE | Admit: 2015-11-16 | Discharge: 2015-11-16 | Disposition: A | Discharge status: Home / Self Care | Payer: Medicare Other | Source: Ambulatory Visit | Attending: Ophthalmology | Admitting: Ophthalmology

## 2015-11-16 ENCOUNTER — Encounter: Payer: Self-pay | Admitting: *Deleted

## 2015-11-16 DIAGNOSIS — Z955 Presence of coronary angioplasty implant and graft: Secondary | ICD-10-CM | POA: Diagnosis not present

## 2015-11-16 DIAGNOSIS — Z9049 Acquired absence of other specified parts of digestive tract: Secondary | ICD-10-CM | POA: Diagnosis not present

## 2015-11-16 DIAGNOSIS — Z85828 Personal history of other malignant neoplasm of skin: Secondary | ICD-10-CM | POA: Insufficient documentation

## 2015-11-16 DIAGNOSIS — G8929 Other chronic pain: Secondary | ICD-10-CM | POA: Insufficient documentation

## 2015-11-16 DIAGNOSIS — Z880 Allergy status to penicillin: Secondary | ICD-10-CM | POA: Diagnosis not present

## 2015-11-16 DIAGNOSIS — I2510 Atherosclerotic heart disease of native coronary artery without angina pectoris: Secondary | ICD-10-CM | POA: Insufficient documentation

## 2015-11-16 DIAGNOSIS — H2512 Age-related nuclear cataract, left eye: Secondary | ICD-10-CM | POA: Insufficient documentation

## 2015-11-16 DIAGNOSIS — I10 Essential (primary) hypertension: Secondary | ICD-10-CM | POA: Diagnosis not present

## 2015-11-16 DIAGNOSIS — Z9849 Cataract extraction status, unspecified eye: Secondary | ICD-10-CM | POA: Insufficient documentation

## 2015-11-16 DIAGNOSIS — Z853 Personal history of malignant neoplasm of breast: Secondary | ICD-10-CM | POA: Diagnosis not present

## 2015-11-16 DIAGNOSIS — M549 Dorsalgia, unspecified: Secondary | ICD-10-CM | POA: Diagnosis not present

## 2015-11-16 DIAGNOSIS — K219 Gastro-esophageal reflux disease without esophagitis: Secondary | ICD-10-CM | POA: Insufficient documentation

## 2015-11-16 DIAGNOSIS — E039 Hypothyroidism, unspecified: Secondary | ICD-10-CM | POA: Insufficient documentation

## 2015-11-16 DIAGNOSIS — Z79891 Long term (current) use of opiate analgesic: Secondary | ICD-10-CM | POA: Diagnosis not present

## 2015-11-16 DIAGNOSIS — Z96659 Presence of unspecified artificial knee joint: Secondary | ICD-10-CM | POA: Diagnosis not present

## 2015-11-16 DIAGNOSIS — Z79899 Other long term (current) drug therapy: Secondary | ICD-10-CM | POA: Diagnosis not present

## 2015-11-16 DIAGNOSIS — M1990 Unspecified osteoarthritis, unspecified site: Secondary | ICD-10-CM | POA: Insufficient documentation

## 2015-11-16 DIAGNOSIS — Z791 Long term (current) use of non-steroidal anti-inflammatories (NSAID): Secondary | ICD-10-CM | POA: Diagnosis not present

## 2015-11-16 HISTORY — PX: CATARACT EXTRACTION W/PHACO: SHX586

## 2015-11-16 SURGERY — PHACOEMULSIFICATION, CATARACT, WITH IOL INSERTION
Anesthesia: Monitor Anesthesia Care | Laterality: Left | Wound class: Clean

## 2015-11-16 MED ORDER — BRIMONIDINE TARTRATE 0.2 % OP SOLN
OPHTHALMIC | Status: DC | PRN
  Administered 2015-11-16: 1 [drp] via OPHTHALMIC

## 2015-11-16 MED ORDER — EPINEPHRINE HCL 1 MG/ML IJ SOLN
INTRAMUSCULAR | Status: DC | PRN
  Administered 2015-11-16: 70 mL via OPHTHALMIC

## 2015-11-16 MED ORDER — NA HYALUR & NA CHOND-NA HYALUR 0.4-0.35 ML IO KIT
PACK | INTRAOCULAR | Status: DC | PRN
  Administered 2015-11-16: 1 mL via INTRAOCULAR

## 2015-11-16 MED ORDER — LACTATED RINGERS IV SOLN: INTRAVENOUS | Status: DC

## 2015-11-16 MED ORDER — MOXIFLOXACIN HCL 0.5 % OP SOLN
OPHTHALMIC | Status: DC | PRN
  Administered 2015-11-16: .2 mL via OPHTHALMIC

## 2015-11-16 MED ORDER — BALANCED SALT IO SOLN
INTRAOCULAR | Status: DC | PRN
  Administered 2015-11-16: 1 mL via OPHTHALMIC

## 2015-11-16 MED ORDER — FENTANYL CITRATE (PF) 100 MCG/2ML IJ SOLN
INTRAMUSCULAR | Status: DC | PRN
Start: 2015-11-16 — End: 2015-11-16
  Administered 2015-11-16: 50 ug via INTRAVENOUS

## 2015-11-16 MED ORDER — MIDAZOLAM HCL 2 MG/2ML IJ SOLN
INTRAMUSCULAR | Status: DC | PRN
  Administered 2015-11-16: 2 mg via INTRAVENOUS

## 2015-11-16 MED ORDER — MOXIFLOXACIN HCL 0.5 % OP SOLN
1.0000 [drp] | OPHTHALMIC | Status: DC | PRN
  Administered 2015-11-16 (×3): 1 [drp] via OPHTHALMIC

## 2015-11-16 MED ORDER — ARMC OPHTHALMIC DILATING DROPS
1.0000 "application " | OPHTHALMIC | Status: DC | PRN
  Administered 2015-11-16 (×3): 1 via OPHTHALMIC

## 2015-11-16 MED ORDER — TIMOLOL MALEATE 0.5 % OP SOLN
OPHTHALMIC | Status: DC | PRN
  Administered 2015-11-16: 1 [drp] via OPHTHALMIC

## 2015-11-16 SURGICAL SUPPLY — 30 items
APL FBRTP 3 NS LF CTTN WD (MISCELLANEOUS) ×1
APPLICATOR COTTON TIP 3IN (MISCELLANEOUS) ×3 IMPLANT
CANNULA ANT/CHMB 27GA (MISCELLANEOUS) ×3 IMPLANT
DISSECTOR HYDRO NUCLEUS 50X22 (MISCELLANEOUS) ×3 IMPLANT
GLOVE BIO SURGEON STRL SZ7 (GLOVE) ×3 IMPLANT
GLOVE SURG LX 6.5 MICRO (GLOVE) ×2
GLOVE SURG LX STRL 6.5 MICRO (GLOVE) ×1 IMPLANT
GOWN STRL REUS W/ TWL LRG LVL3 (GOWN DISPOSABLE) ×2 IMPLANT
GOWN STRL REUS W/TWL LRG LVL3 (GOWN DISPOSABLE) ×6
LENS IOL ACRSF IQ ULTRA 23.0 (Intraocular Lens) ×1 IMPLANT
LENS IOL ACRYSOF IQ 23.0 (Intraocular Lens) ×3 IMPLANT
MARKER SKIN DUAL TIP RULER LAB (MISCELLANEOUS) ×3 IMPLANT
NEEDLE FILTER BLUNT 18X 1/2SAF (NEEDLE) ×2
NEEDLE FILTER BLUNT 18X1 1/2 (NEEDLE) ×1 IMPLANT
PACK CATARACT BRASINGTON (MISCELLANEOUS) ×3 IMPLANT
PACK EYE AFTER SURG (MISCELLANEOUS) ×3 IMPLANT
PACK OPTHALMIC (MISCELLANEOUS) ×3 IMPLANT
RING MALYGIN 7.0 (MISCELLANEOUS) IMPLANT
SOL BAL SALT 15ML (MISCELLANEOUS)
SOLUTION BAL SALT 15ML (MISCELLANEOUS) IMPLANT
SUT ETHILON 10-0 CS-B-6CS-B-6 (SUTURE)
SUT VICRYL  9 0 (SUTURE)
SUT VICRYL 9 0 (SUTURE) IMPLANT
SUTURE EHLN 10-0 CS-B-6CS-B-6 (SUTURE) IMPLANT
SYR 3ML LL SCALE MARK (SYRINGE) ×3 IMPLANT
SYR TB 1ML LUER SLIP (SYRINGE) ×3 IMPLANT
WATER STERILE IRR 250ML POUR (IV SOLUTION) ×3 IMPLANT
WATER STERILE IRR 500ML POUR (IV SOLUTION) IMPLANT
WICK EYE OCUCEL (MISCELLANEOUS) IMPLANT
WIPE NON LINTING 3.25X3.25 (MISCELLANEOUS) ×3 IMPLANT

## 2015-11-16 NOTE — Transfer of Care (Signed)
Immediate Anesthesia Transfer of Care Note  Patient: Laurie Hale  Procedure(s) Performed: Procedure(s) with comments: CATARACT EXTRACTION PHACO AND INTRAOCULAR LENS PLACEMENT (IOC) (Left) - LEFT  Patient Location: PACU  Anesthesia Type: MAC  Level of Consciousness: awake, alert  and patient cooperative  Airway and Oxygen Therapy: Patient Spontanous Breathing and Patient connected to supplemental oxygen  Post-op Assessment: Post-op Vital signs reviewed, Patient's Cardiovascular Status Stable, Respiratory Function Stable, Patent Airway and No signs of Nausea or vomiting  Post-op Vital Signs: Reviewed and stable  Complications: No apparent anesthesia complications

## 2015-11-16 NOTE — Anesthesia Procedure Notes (Signed)
Procedure Name: MAC Performed by: Shakala Marlatt Pre-anesthesia Checklist: Patient identified, Emergency Drugs available, Suction available, Timeout performed and Patient being monitored Patient Re-evaluated:Patient Re-evaluated prior to inductionOxygen Delivery Method: Nasal cannula Placement Confirmation: positive ETCO2     

## 2015-11-16 NOTE — Anesthesia Postprocedure Evaluation (Addendum)
Anesthesia Post Note  Patient: Laurie Hale  Procedure(s) Performed: Procedure(s) (LRB): CATARACT EXTRACTION PHACO AND INTRAOCULAR LENS PLACEMENT (IOC) (Left)  Patient location during evaluation: PACU Anesthesia Type: MAC Level of consciousness: awake and alert and oriented Pain management: satisfactory to patient Vital Signs Assessment: post-procedure vital signs reviewed and stable Respiratory status: spontaneous breathing, nonlabored ventilation and respiratory function stable Cardiovascular status: blood pressure returned to baseline and stable Postop Assessment: Adequate PO intake and No signs of nausea or vomiting Anesthetic complications: no    Raliegh Ip

## 2015-11-16 NOTE — H&P (Signed)
H+P reviewed and is up to date, please see paper chart.  

## 2015-11-16 NOTE — Op Note (Signed)
Date of Surgery: 11/16/2015  PREOPERATIVE DIAGNOSES: Visually significant nuclear sclerotic cataract, left eye.  POSTOPERATIVE DIAGNOSES: Same  PROCEDURES PERFORMED: Cataract extraction with intraocular lens implant, left eye.  SURGEON: Almon Hercules, M.D.  ANESTHESIA: MAC and topical  IMPLANTS: [id number] +23.0 D  Implant Name Type Inv. Item Serial No. Manufacturer Lot No. LRB No. Used  LENS IOL ACRYSOF IQ 23.0 - [id number] Intraocular Lens LENS IOL ACRYSOF IQ 23.0 [id number] ALCON   Left 1    COMPLICATIONS: None.  DESCRIPTION OF PROCEDURE: Therapeutic options were discussed with the patient preoperatively, including a discussion of risks and benefits of surgery. Informed consent was obtained. An IOL-Master and immersion biometry were used to take the lens measurements, and a dilated fundus exam was performed within 6 months of the surgical date.  The patient was premedicated and brought to the operating room and placed on the operating table in the supine position. After adequate anesthesia, the patient was prepped and draped in the usual sterile ophthalmic fashion. A wire lid speculum was inserted and the microscope was positioned. A Superblade was used to create a paracentesis site at the limbus and a small amount of dilute preservative free lidocaine was instilled into the anterior chamber, followed by dispersive viscoelastic. A clear corneal incision was created temporally using a 2.4 mm keratome blade. Capsulorrhexis was then performed. In situ phacoemulsification was performed.  Cortical material was removed with the irrigation-aspiration unit. Dispersive viscoelastic was instilled to open the capsular bag. A posterior chamber intraocular lens with the specifications above was inserted and positioned. Irrigation-aspiration was used to remove all viscoelastic. Cefuroxime 1cc was instilled into the anterior chamber, and the corneal incision was checked and found to be water tight. The  eyelid speculum was removed.  The operative eye was covered with protective goggles after instilling 1 drop of timolol and brimonidine. The patient tolerated the procedure well. There were no complications.

## 2015-11-16 NOTE — Anesthesia Preprocedure Evaluation (Signed)
Anesthesia Evaluation  Patient identified by MRN, date of birth, ID band Patient awake    Reviewed: Allergy & Precautions, H&P , NPO status , Patient's Chart, lab work & pertinent test results  Airway Mallampati: II  TM Distance: >3 FB Neck ROM: full    Dental   Pulmonary    Pulmonary exam normal        Cardiovascular hypertension, + CAD  Normal cardiovascular exam     Neuro/Psych    GI/Hepatic GERD  ,  Endo/Other  Hypothyroidism   Renal/GU      Musculoskeletal   Abdominal   Peds  Hematology   Anesthesia Other Findings   Reproductive/Obstetrics                             Anesthesia Physical Anesthesia Plan  ASA: III  Anesthesia Plan: MAC   Post-op Pain Management:    Induction:   Airway Management Planned:   Additional Equipment:   Intra-op Plan:   Post-operative Plan:   Informed Consent: I have reviewed the patients History and Physical, chart, labs and discussed the procedure including the risks, benefits and alternatives for the proposed anesthesia with the patient or authorized representative who has indicated his/her understanding and acceptance.     Plan Discussed with:   Anesthesia Plan Comments:         Anesthesia Quick Evaluation

## 2015-11-17 ENCOUNTER — Encounter: Payer: Self-pay | Admitting: Ophthalmology

## 2016-02-24 ENCOUNTER — Other Ambulatory Visit: Payer: Self-pay | Admitting: Family Medicine

## 2016-02-24 DIAGNOSIS — Z853 Personal history of malignant neoplasm of breast: Secondary | ICD-10-CM

## 2016-03-16 ENCOUNTER — Ambulatory Visit
Admission: RE | Admit: 2016-03-16 | Discharge: 2016-03-16 | Disposition: A | Discharge status: Home / Self Care | Payer: Medicare Other | Source: Ambulatory Visit | Attending: Family Medicine | Admitting: Family Medicine

## 2016-03-16 DIAGNOSIS — Z853 Personal history of malignant neoplasm of breast: Secondary | ICD-10-CM | POA: Diagnosis present

## 2016-03-16 DIAGNOSIS — Z9889 Other specified postprocedural states: Secondary | ICD-10-CM | POA: Insufficient documentation

## 2016-03-16 HISTORY — DX: Malignant neoplasm of unspecified site of unspecified female breast: C50.919

## 2016-10-17 IMAGING — MR MR LUMBAR SPINE WO/W CM
4 of 7 series · 16 of 48 positions shown · IV contrast (15 ML MULTIHANCE)
Comparison: Lumbar MRI 11/06/2006.  Lumbar CT 10/23/2006

CLINICAL DATA: Lumbar spondylosis. Back pain. Lumbar fusion head.
Bilateral leg pain.

EXAM:
MRI LUMBAR SPINE WITHOUT AND WITH CONTRAST
TECHNIQUE: Multiplanar and multiecho pulse sequences of the lumbar spine were
obtained without and with intravenous contrast.
CONTRAST:  15mL MULTIHANCE GADOBENATE DIMEGLUMINE 529 MG/ML IV SOLN

[Series 3: T2 · sagittal · 4.0mm · 0.44mm/px · 4 of 17 slices shown (1 of 2)]
[im 1/17]
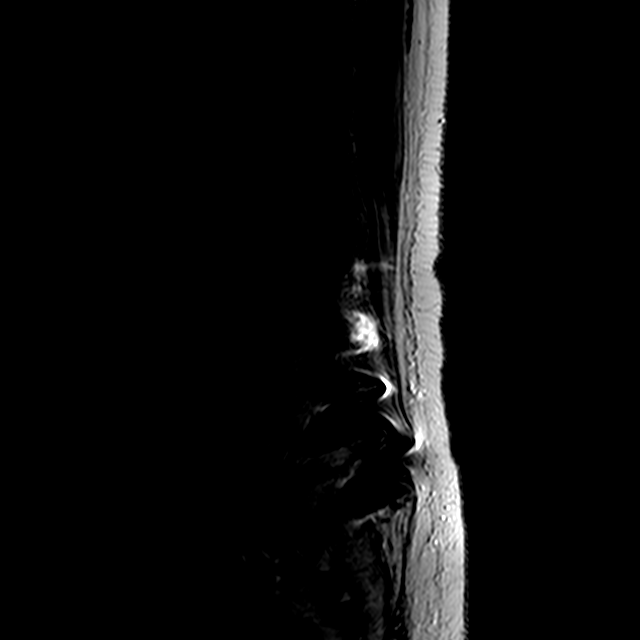
[im 6/17]
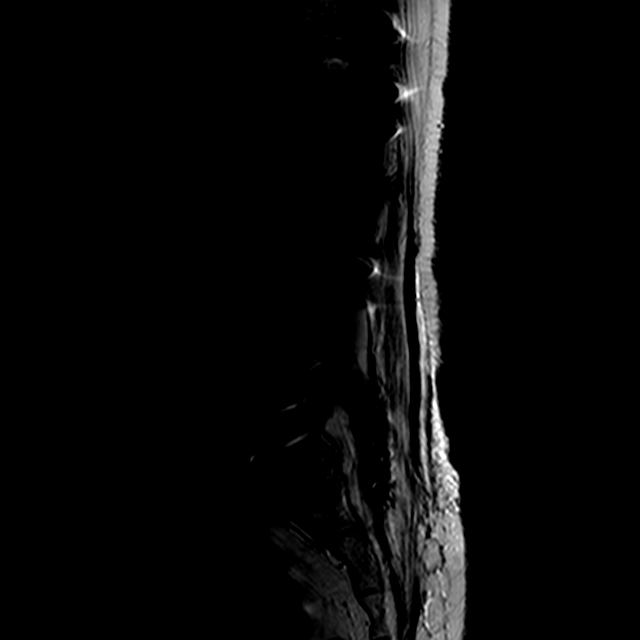
[im 11/17]
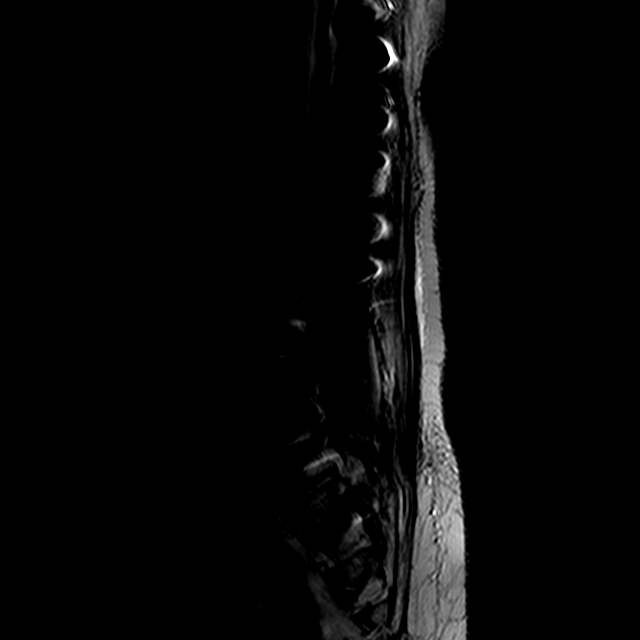
[im 17/17]
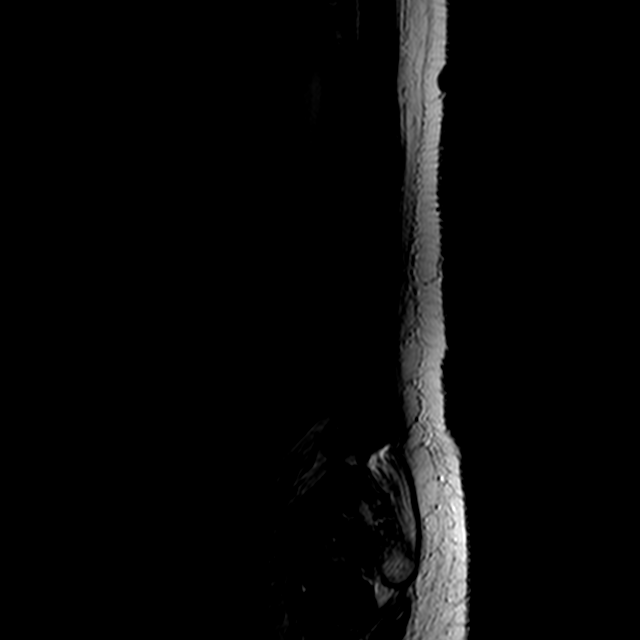

[Series 4: T1 · sagittal · 4.0mm · 0.44mm/px · 3 of 17 slices shown (1 of 2)]
[im 1/17]
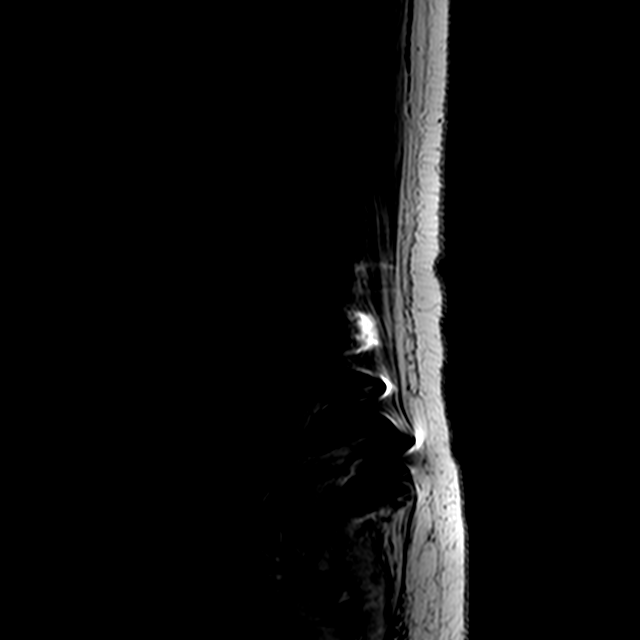
[im 11/17]
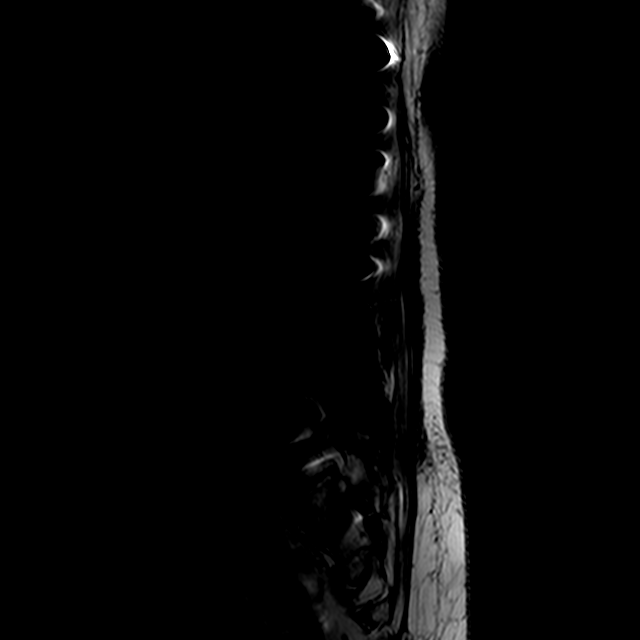
[im 17/17]
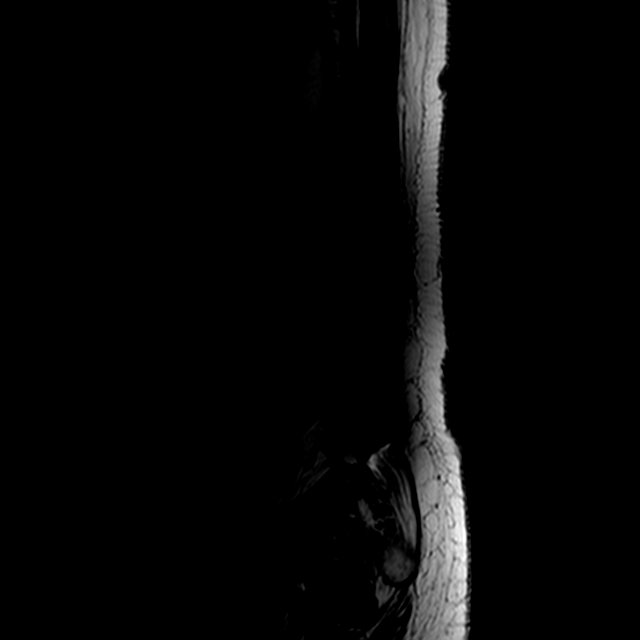

[Series 6: T2 · axial · 4.0mm · 0.39mm/px · z∈[-97,+77]mm · 6 of 35 slices shown (2 of 2)]
[im 1/35]
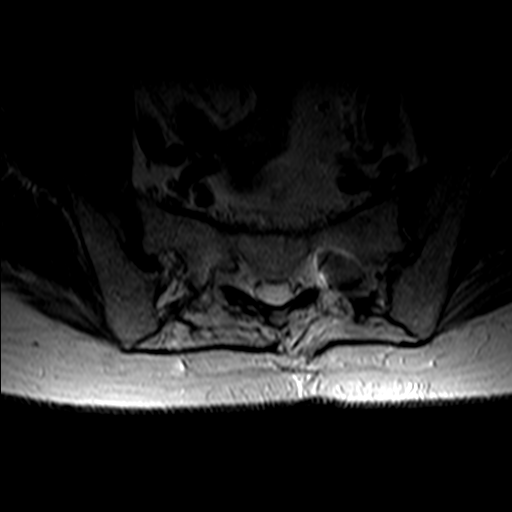
[im 4/35]
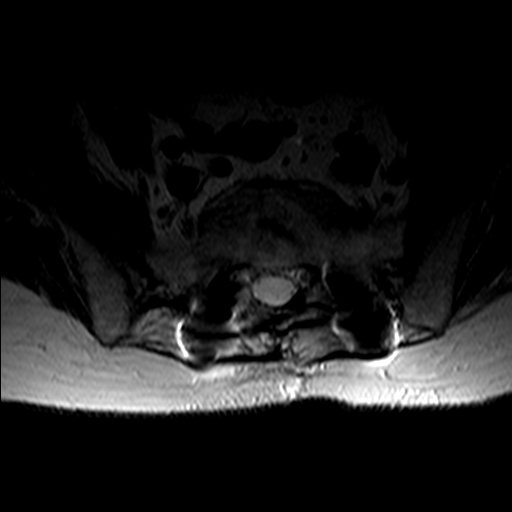
[im 12/35]
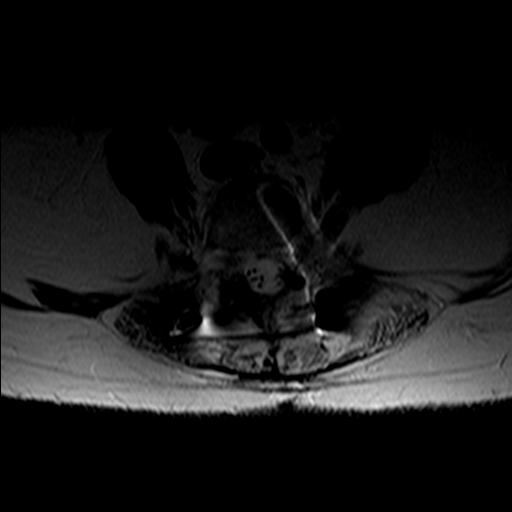
[im 16/35]
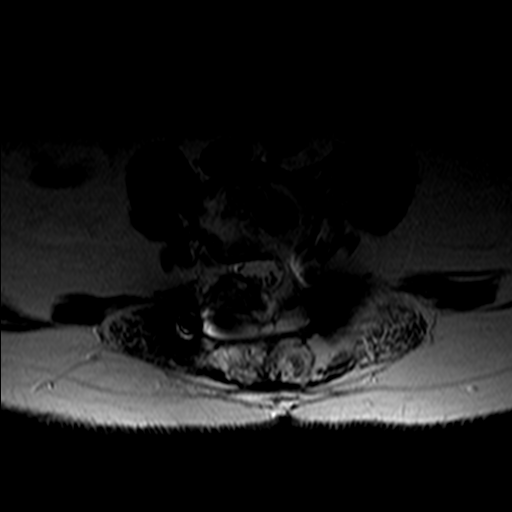
[im 19/35]
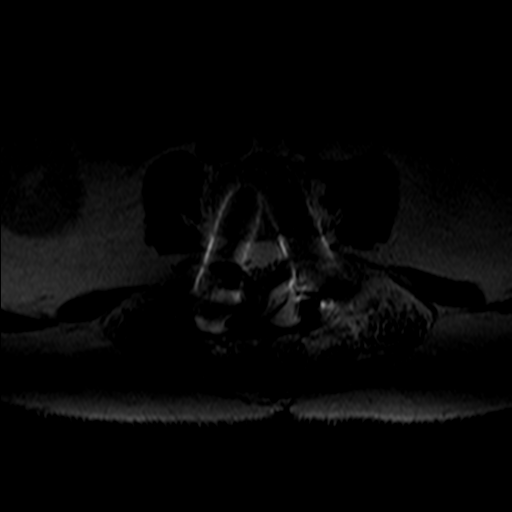
[im 31/35]
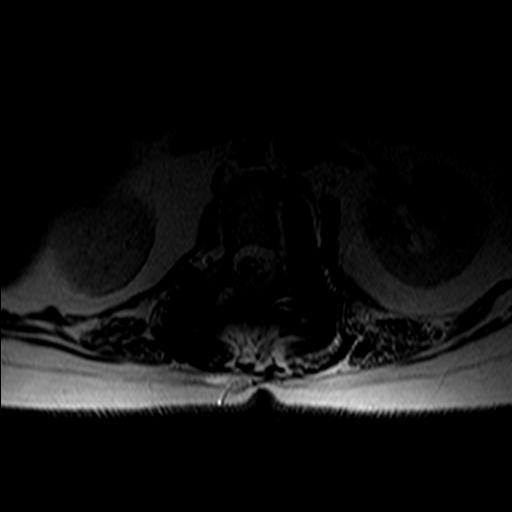

[Series 7: T1 · axial · 4.0mm · 0.39mm/px · z∈[-82,+77]mm · 3 of 35 slices shown (2 of 2)]
[im 4/35]
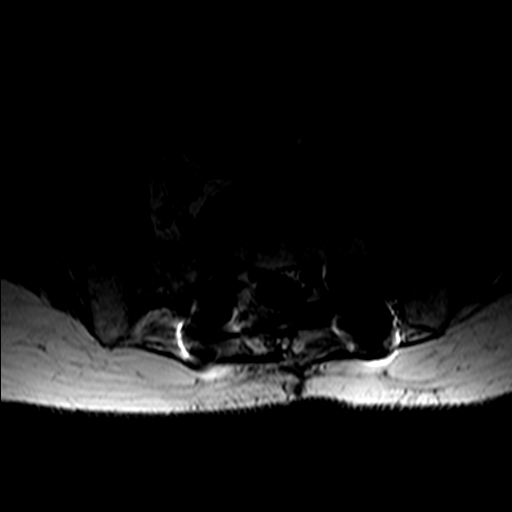
[im 19/35]
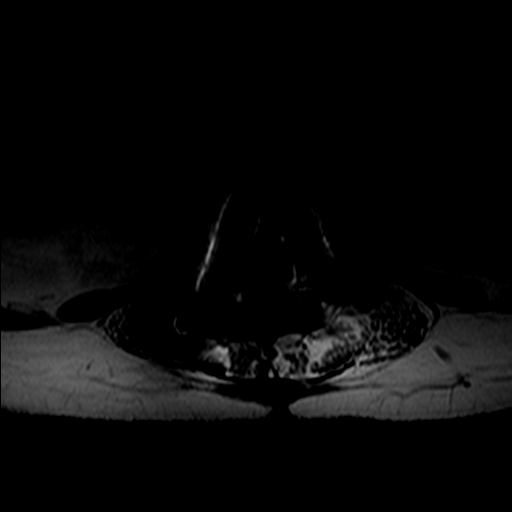
[im 31/35]
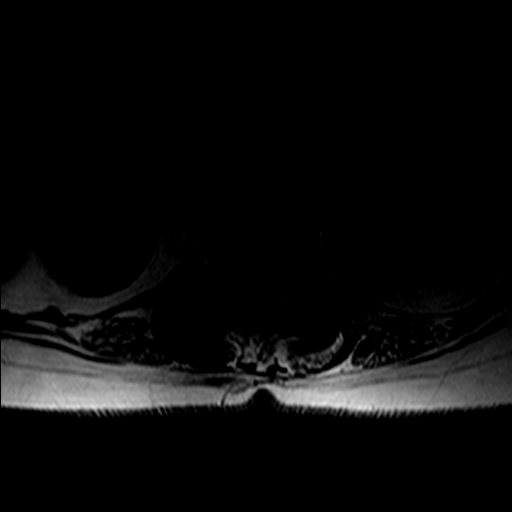

[16 of 48 positions shown; findings below may reference images not displayed]

FINDINGS: Pedicle screw and posterior rod fusion extending from the lower
thoracic spine through S1. Upper extent of the fusion is not imaged.
Hardware is causing local artifact.

Negative for fracture or mass. No pathologic enhancement. Conus
medullaris appears normal and terminates at L1-2.

T12-L1:  Negative

L1-2: Mild retrolisthesis. Moderate disc degeneration and spurring.
Posterior decompression without spinal stenosis.

L2-3: Pedicle screw fusion. Disc degeneration and spurring. No
significant spinal stenosis.

L3-4: Mild anterior slip. Pedicle screw and interbody fusion.
Posterior decompression. No significant spinal stenosis.

L4-5: Disc degeneration and spondylosis. Left foraminal encroachment
due to spurring. No significant spinal stenosis

L5-S1: Pedicle screw and interbody fusion. No significant spinal or
foraminal stenosis.
IMPRESSION: Posterior hardware fusion from the lower thoracic spine to S1.
Multilevel degenerative change. No acute abnormality and no change
from the prior study of [id number]

## 2016-11-09 ENCOUNTER — Other Ambulatory Visit: Payer: Self-pay | Admitting: Orthopedic Surgery

## 2016-11-09 DIAGNOSIS — M5416 Radiculopathy, lumbar region: Secondary | ICD-10-CM

## 2016-11-22 ENCOUNTER — Ambulatory Visit
Admission: RE | Admit: 2016-11-22 | Discharge: 2016-11-22 | Disposition: A | Discharge status: Home / Self Care | Payer: Medicare Other | Source: Ambulatory Visit | Attending: Orthopedic Surgery | Admitting: Orthopedic Surgery

## 2016-11-22 DIAGNOSIS — Z96651 Presence of right artificial knee joint: Secondary | ICD-10-CM | POA: Diagnosis not present

## 2016-11-22 DIAGNOSIS — M5416 Radiculopathy, lumbar region: Secondary | ICD-10-CM | POA: Diagnosis present

## 2017-01-10 ENCOUNTER — Other Ambulatory Visit: Payer: Self-pay | Admitting: Student

## 2017-01-10 DIAGNOSIS — M79605 Pain in left leg: Secondary | ICD-10-CM

## 2017-01-10 DIAGNOSIS — Z9889 Other specified postprocedural states: Secondary | ICD-10-CM

## 2017-01-10 DIAGNOSIS — M545 Low back pain: Secondary | ICD-10-CM

## 2017-01-10 DIAGNOSIS — G8929 Other chronic pain: Secondary | ICD-10-CM

## 2017-02-03 ENCOUNTER — Ambulatory Visit
Admission: RE | Admit: 2017-02-03 | Discharge: 2017-02-03 | Disposition: A | Discharge status: Home / Self Care | Payer: Medicare Other | Source: Ambulatory Visit | Attending: Student | Admitting: Student

## 2017-02-03 DIAGNOSIS — M79605 Pain in left leg: Secondary | ICD-10-CM

## 2017-02-03 DIAGNOSIS — M1991 Primary osteoarthritis, unspecified site: Secondary | ICD-10-CM | POA: Diagnosis not present

## 2017-02-03 DIAGNOSIS — M545 Low back pain: Secondary | ICD-10-CM

## 2017-02-03 DIAGNOSIS — Z9889 Other specified postprocedural states: Secondary | ICD-10-CM

## 2017-02-03 DIAGNOSIS — G8929 Other chronic pain: Secondary | ICD-10-CM

## 2017-02-03 MED ORDER — IOPAMIDOL (ISOVUE-M 300) INJECTION 61%
10.0000 mL | Freq: Once | INTRAMUSCULAR | Status: AC | PRN
  Administered 2017-02-03: 10 mL via INTRA_ARTICULAR

## 2017-02-03 NOTE — Progress Notes (Signed)
Pt stable after myelowgram-vss,back stable-f/u with her md-d/c instructions given

## 2017-03-30 ENCOUNTER — Other Ambulatory Visit: Payer: Self-pay | Admitting: Family Medicine

## 2017-03-30 DIAGNOSIS — Z1239 Encounter for other screening for malignant neoplasm of breast: Secondary | ICD-10-CM

## 2017-03-30 DIAGNOSIS — Z853 Personal history of malignant neoplasm of breast: Secondary | ICD-10-CM

## 2017-04-20 ENCOUNTER — Other Ambulatory Visit: Payer: Medicare Other

## 2017-05-01 ENCOUNTER — Ambulatory Visit
Admission: RE | Admit: 2017-05-01 | Discharge: 2017-05-01 | Disposition: A | Discharge status: Home / Self Care | Payer: Medicare Other | Source: Ambulatory Visit | Attending: Family Medicine | Admitting: Family Medicine

## 2017-05-01 DIAGNOSIS — Z853 Personal history of malignant neoplasm of breast: Secondary | ICD-10-CM

## 2017-05-01 DIAGNOSIS — Z1239 Encounter for other screening for malignant neoplasm of breast: Secondary | ICD-10-CM

## 2017-05-01 DIAGNOSIS — Z08 Encounter for follow-up examination after completed treatment for malignant neoplasm: Secondary | ICD-10-CM | POA: Diagnosis not present

## 2017-05-01 HISTORY — DX: Personal history of irradiation: Z92.3

## 2017-09-11 ENCOUNTER — Other Ambulatory Visit: Payer: Self-pay | Admitting: Orthopedic Surgery

## 2017-09-27 NOTE — Progress Notes (Signed)
08-30-17 Cardiac Clearance from Etta Quill, NP and EKG on chart

## 2017-09-27 NOTE — Patient Instructions (Addendum)
Laurie Hale  09/27/2017   Your procedure is scheduled on: 10-09-17   Report to [facility] Main  Entrance    Report to Admitting at 8:15 AM    Call this number if you have problems the morning of surgery [phone]   Remember: Do not eat food or drink liquids :After Midnight.     Take these medicines the morning of surgery with A SIP OF WATER: Sertraline (Zoloft)                                 You may not have any metal on your body including hair pins and              piercings  Do not wear jewelry, make-up, lotions, powders or perfumes, deodorant             Do not wear nail polish.  Do not shave  48 hours prior to surgery.                 Do not bring valuables to the hospital. [place].  Contacts, dentures or bridgework may not be worn into surgery.  Leave suitcase in the car. After surgery it may be brought to your room.   Special Instructions: N/A              Please read over the following fact sheets you were given: _____________________________________________________________________             [facility] - Preparing for Surgery Before surgery, you can play an important role.  Because skin is not sterile, your skin needs to be as free of germs as possible.  You can reduce the number of germs on your skin by washing with CHG (chlorahexidine gluconate) soap before surgery.  CHG is an antiseptic cleaner which kills germs and bonds with the skin to continue killing germs even after washing. Please DO NOT use if you have an allergy to CHG or antibacterial soaps.  If your skin becomes reddened/irritated stop using the CHG and inform your nurse when you arrive at Short Stay. Do not shave (including legs and underarms) for at least 48 hours prior to the first CHG shower.  You may shave your face/neck. Please follow these instructions carefully:  1.  Shower with CHG Soap the night before surgery and  the  morning of Surgery.  2.  If you choose to wash your hair, wash your hair first as usual with your  normal  shampoo.  3.  After you shampoo, rinse your hair and body thoroughly to remove the  shampoo.                           4.  Use CHG as you would any other liquid soap.  You can apply chg directly  to the skin and wash                       Gently with a scrungie or clean washcloth.  5.  Apply the CHG Soap to your body ONLY FROM THE NECK DOWN.   Do not use on face/ open  Wound or open sores. Avoid contact with eyes, ears mouth and genitals (private parts).                       Wash face,  Genitals (private parts) with your normal soap.             6.  Wash thoroughly, paying special attention to the area where your surgery  will be performed.  7.  Thoroughly rinse your body with warm water from the neck down.  8.  DO NOT shower/wash with your normal soap after using and rinsing off  the CHG Soap.                9.  Pat yourself dry with a clean towel.            10.  Wear clean pajamas.            11.  Place clean sheets on your bed the night of your first shower and do not  sleep with pets. Day of Surgery : Do not apply any lotions/deodorants the morning of surgery.  Please wear clean clothes to the hospital/surgery center.  FAILURE TO FOLLOW THESE INSTRUCTIONS MAY RESULT IN THE CANCELLATION OF YOUR SURGERY PATIENT SIGNATURE_________________________________  NURSE SIGNATURE__________________________________  ________________________________________________________________________   Adam Phenix  An incentive spirometer is a tool that can help keep your lungs clear and active. This tool measures how well you are filling your lungs with each breath. Taking long deep breaths may help reverse or decrease the chance of developing breathing (pulmonary) problems (especially infection) following:  A long period of time when you are unable to move or be  active. BEFORE THE PROCEDURE   If the spirometer includes an indicator to show your best effort, your nurse or respiratory therapist will set it to a desired goal.  If possible, sit up straight or lean slightly forward. Try not to slouch.  Hold the incentive spirometer in an upright position. INSTRUCTIONS FOR USE  1. Sit on the edge of your bed if possible, or sit up as far as you can in bed or on a chair. 2. Hold the incentive spirometer in an upright position. 3. Breathe out normally. 4. Place the mouthpiece in your mouth and seal your lips tightly around it. 5. Breathe in slowly and as deeply as possible, raising the piston or the ball toward the top of the column. 6. Hold your breath for 3-5 seconds or for as long as possible. Allow the piston or ball to fall to the bottom of the column. 7. Remove the mouthpiece from your mouth and breathe out normally. 8. Rest for a few seconds and repeat Steps 1 through 7 at least 10 times every 1-2 hours when you are awake. Take your time and take a few normal breaths between deep breaths. 9. The spirometer may include an indicator to show your best effort. Use the indicator as a goal to work toward during each repetition. 10. After each set of 10 deep breaths, practice coughing to be sure your lungs are clear. If you have an incision (the cut made at the time of surgery), support your incision when coughing by placing a pillow or rolled up towels firmly against it. Once you are able to get out of bed, walk around indoors and cough well. You may stop using the incentive spirometer when instructed by your caregiver.  RISKS AND COMPLICATIONS  Take your time so you do not get  dizzy or light-headed.  If you are in pain, you may need to take or ask for pain medication before doing incentive spirometry. It is harder to take a deep breath if you are having pain. AFTER USE  Rest and breathe slowly and easily.  It can be helpful to keep track of a log of  your progress. Your caregiver can provide you with a simple table to help with this. If you are using the spirometer at home, follow these instructions: [place] IF:   You are having difficultly using the spirometer.  You have trouble using the spirometer as often as instructed.  Your pain medication is not giving enough relief while using the spirometer.  You develop fever of 100.5 F (38.1 C) or higher. SEEK IMMEDIATE MEDICAL CARE IF:   You cough up bloody sputum that had not been present before.  You develop fever of 102 F (38.9 C) or greater.  You develop worsening pain at or near the incision site. MAKE SURE YOU:   Understand these instructions.  Will watch your condition.  Will get help right away if you are not doing well or get worse. Document Released: 06/13/2006 Document Revised: 04/25/2011 Document Reviewed: 08/14/2006 [facility] Patient Information 2014 [place], [place].   ________________________________________________________________________

## 2017-09-28 ENCOUNTER — Encounter (HOSPITAL_COMMUNITY): Payer: Self-pay

## 2017-09-28 ENCOUNTER — Other Ambulatory Visit: Payer: Self-pay

## 2017-09-28 ENCOUNTER — Encounter (HOSPITAL_COMMUNITY)
Admission: RE | Admit: 2017-09-28 | Discharge: 2017-09-28 | Disposition: A | Discharge status: Home / Self Care | Payer: Medicare Other | Source: Ambulatory Visit | Attending: Orthopedic Surgery | Admitting: Orthopedic Surgery

## 2017-09-28 DIAGNOSIS — M1712 Unilateral primary osteoarthritis, left knee: Secondary | ICD-10-CM | POA: Insufficient documentation

## 2017-09-28 DIAGNOSIS — Z01818 Encounter for other preprocedural examination: Secondary | ICD-10-CM | POA: Diagnosis not present

## 2017-09-28 LAB — SURGICAL PCR SCREEN
MRSA, PCR: NEGATIVE
Staphylococcus aureus: NEGATIVE

## 2017-09-28 LAB — CBC WITH DIFFERENTIAL/PLATELET
BASOS ABS: 0 10*3/uL (ref 0.0–0.1)
Basophils Relative: 0 %
EOS PCT: 3 %
Eosinophils Absolute: 0.2 10*3/uL (ref 0.0–0.7)
HCT: 39.9 % (ref 36.0–46.0)
Hemoglobin: 13.5 g/dL (ref 12.0–15.0)
LYMPHS PCT: 18 %
Lymphs Abs: 1.2 10*3/uL (ref 0.7–4.0)
MCH: 30.8 pg (ref 26.0–34.0)
MCHC: 33.8 g/dL (ref 30.0–36.0)
MCV: 91.1 fL (ref 78.0–100.0)
Monocytes Absolute: 0.3 10*3/uL (ref 0.1–1.0)
Monocytes Relative: 5 %
NEUTROS PCT: 74 %
Neutro Abs: 4.6 10*3/uL (ref 1.7–7.7)
PLATELETS: 226 10*3/uL (ref 150–400)
RBC: 4.38 MIL/uL (ref 3.87–5.11)
RDW: 12.7 % (ref 11.5–15.5)
WBC: 6.4 10*3/uL (ref 4.0–10.5)

## 2017-09-28 LAB — COMPREHENSIVE METABOLIC PANEL
ALT: 17 U/L (ref 0–44)
AST: 21 U/L (ref 15–41)
Albumin: 4.6 g/dL (ref 3.5–5.0)
Alkaline Phosphatase: 48 U/L (ref 38–126)
Anion gap: 8 (ref 5–15)
BUN: 22 mg/dL (ref 8–23)
CHLORIDE: 103 mmol/L (ref 98–111)
CO2: 33 mmol/L — AB (ref 22–32)
CREATININE: 0.84 mg/dL (ref 0.44–1.00)
Calcium: 9.9 mg/dL (ref 8.9–10.3)
GFR calc Af Amer: >60 mL/min (ref >60)
Glucose, Bld: 112 mg/dL — ABNORMAL HIGH (ref 70–99)
Potassium: 3.7 mmol/L (ref 3.5–5.1)
SODIUM: 144 mmol/L (ref 135–145)
Total Bilirubin: 0.5 mg/dL (ref 0.3–1.2)
Total Protein: 7.5 g/dL (ref 6.5–8.1)

## 2017-10-08 MED ORDER — BUPIVACAINE LIPOSOME 1.3 % IJ SUSP
20.0000 mL | Freq: Once | INTRAMUSCULAR | Status: DC
  Filled 2017-10-08: qty 20

## 2017-10-08 NOTE — H&P (Signed)
Laurie Hale MRN:  [id number] DOB/SEX:  1940-04-17/female  CHIEF COMPLAINT:  Painful left Knee  HISTORY: Patient is a 77 y.o. female presented with a history of pain in the left knee. Onset of symptoms was gradual starting a few years ago with gradually worsening course since that time. Patient has been treated conservatively with over-the-counter NSAIDs and activity modification. Patient currently rates pain in the knee at 10 out of 10 with activity. There is pain at night.  PAST MEDICAL HISTORY: Patient Active Problem List   Diagnosis Date Noted  . Dysuria 10/21/2013  . Nasal lesion 07/14/2013  . Fatigue 04/02/2013  . Breast cancer ([place]) 12/02/2012  . CAD (coronary artery disease) 09/15/2012  . Essential hypertension, benign 09/15/2012  . Hypercholesterolemia 09/15/2012  . Hypothyroidism 09/15/2012  . GERD (gastroesophageal reflux disease) 09/15/2012  . Diverticulosis 09/15/2012  . Chronic back pain 09/15/2012  . Hypokalemia 09/15/2012   Past Medical History:  Diagnosis Date  . Arthritis   . 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 Cataract    immature bilateral  . Chronic back pain   . Constipation   . Coronary artery disease   . Diverticulosis   . GERD (gastroesophageal reflux disease)    takes a med daily-to bring with med name at surgery  . History of migraine    hasn't had one in yrs-per pt Zoloft stopped them  . Hyperlipidemia    takes Crestor and Niacin daily  . Hypertension    takes Amlodipine  and Lisinopril daily  . Hypokalemia    takes K Dur tid  . Hypothyroidism    takes Synthroid daily  . Insomnia    takes restoril prn  . Joint pain   . Joint swelling   . Peripheral edema    takes HCTZ daily  . Personal history of radiation therapy 2014   F/U lumpectomy   . Presence of pessary    Past Surgical History:  Procedure Laterality Date  . ABDOMINAL HYSTERECTOMY    . BACK SURGERY  2009  . BREAST BIOPSY Left 10/22/2012   Stereo - DCIS  .  BREAST LUMPECTOMY Left 2014   DCIS. F/U radiation   . CARDIAC CATHETERIZATION  1999  . CATARACT EXTRACTION W/PHACO Right 10/05/2015   Procedure: CATARACT EXTRACTION PHACO AND INTRAOCULAR LENS PLACEMENT (IOC);  Surgeon: Ronnell Freshwater, MD;  Location: [place];  Service: Ophthalmology;  Laterality: Right;  RIGHT  . CATARACT EXTRACTION W/PHACO Left 11/16/2015   Procedure: CATARACT EXTRACTION PHACO AND INTRAOCULAR LENS PLACEMENT (IOC);  Surgeon: Ronnell Freshwater, MD;  Location: [place];  Service: Ophthalmology;  Laterality: Left;  LEFT  . COLONOSCOPY    . CORONARY ANGIOPLASTY     1 stent  . partial coloectomy  1995  . right knee arthroscopy  2013  . TONSILLECTOMY    . TOTAL KNEE ARTHROPLASTY  01/02/2012   RIGHT KNEE  . TOTAL KNEE ARTHROPLASTY  01/02/2012   Procedure: TOTAL KNEE ARTHROPLASTY;  Surgeon: Vickey Huger, MD;  Location: [place];  Service: Orthopedics;  Laterality: Right;     MEDICATIONS:   No medications prior to admission.    ALLERGIES:   Allergies  Allergen Reactions  . Ciprofloxacin   . Penicillins Hives          REVIEW OF SYSTEMS:  A comprehensive review of systems was negative except for: Musculoskeletal: positive for arthralgias and bone pain   FAMILY HISTORY:   Family History  Problem Relation Age  of Onset  . Hypertension Mother   . Sudden death Sister        x2  . Breast cancer Sister   . Heart disease Brother     SOCIAL HISTORY:   Social History   Tobacco Use  . Smoking status: Never Smoker  . Smokeless tobacco: Never Used  Substance Use Topics  . Alcohol use: No     EXAMINATION:  Vital signs in last 24 hours:    There were no vitals taken for this visit.  General Appearance:    Alert, cooperative, no distress, appears stated age  Head:    Normocephalic, without obvious abnormality, atraumatic  Eyes:    PERRL, conjunctiva/corneas clear, EOM's intact, fundi    benign, both eyes  Ears:    Normal TM's  and external ear canals, both ears  Nose:   Nares normal, septum midline, mucosa normal, no drainage    or sinus tenderness  Throat:   Lips, mucosa, and tongue normal; teeth and gums normal  Neck:   Supple, symmetrical, trachea midline, no adenopathy;    thyroid:  no enlargement/tenderness/nodules; no carotid   bruit or JVD  Back:     Symmetric, no curvature, ROM normal, no CVA tenderness  Lungs:     Clear to auscultation bilaterally, respirations unlabored  Chest Wall:    No tenderness or deformity   Heart:    Regular rate and rhythm, S1 and S2 normal, no murmur, rub   or gallop  Breast Exam:    No tenderness, masses, or nipple abnormality  Abdomen:     Soft, non-tender, bowel sounds active all four quadrants,    no masses, no organomegaly  Genitalia:    Normal female without lesion, discharge or tenderness  Rectal:    Normal tone, normal prostate, no masses or tenderness;   guaiac negative stool  Extremities:   Extremities normal, atraumatic, no cyanosis or edema  Pulses:   2+ and symmetric all extremities  Skin:   Skin color, texture, turgor normal, no rashes or lesions  Lymph nodes:   Cervical, supraclavicular, and axillary nodes normal  Neurologic:   CNII-XII intact, normal strength, sensation and reflexes    throughout    Musculoskeletal:  ROM 0-120, Ligaments intact,  Imaging Review Plain radiographs demonstrate severe degenerative joint disease of the left knee. The overall alignment is neutral. The bone quality appears to be good for age and reported activity level.  Assessment/Plan: Primary osteoarthritis, left knee   The patient history, physical examination and imaging studies are consistent with advanced degenerative joint disease of the left knee. The patient has failed conservative treatment.  The clearance notes were reviewed.  After discussion with the patient it was felt that Total Knee Replacement was indicated. The procedure,  risks, and benefits of total knee  arthroplasty were presented and reviewed. The risks including but not limited to aseptic loosening, infection, blood clots, vascular injury, stiffness, patella tracking problems complications among others were discussed. The patient acknowledged the explanation, agreed to proceed with the plan.  Preoperative templating of the joint replacement has been completed, documented, and submitted to the Operating Room personnel in order to optimize intra-operative equipment management.    Patient's anticipated LOS is less than 2 midnights, meeting these requirements: - Lives within 1 hour of care - Has a competent adult at home to recover with post-op recover - NO history of  - Chronic pain requiring opiods  - Diabetes  - Coronary Artery Disease  - Heart  failure  - Heart attack  - Stroke  - DVT/VTE  - Cardiac arrhythmia  - Respiratory Failure/COPD  - Renal failure  - Anemia  - Advanced Liver disease       Donia Ast 10/08/2017, 9:13 AM

## 2017-10-09 ENCOUNTER — Ambulatory Visit (HOSPITAL_COMMUNITY): Payer: Medicare Other | Admitting: Anesthesiology

## 2017-10-09 ENCOUNTER — Encounter (HOSPITAL_COMMUNITY): Payer: Self-pay | Admitting: *Deleted

## 2017-10-09 ENCOUNTER — Observation Stay (HOSPITAL_COMMUNITY)
Admission: RE | Admit: 2017-10-09 | Discharge: 2017-10-10 | Disposition: A | Discharge status: Home / Self Care | Payer: Medicare Other | Source: Ambulatory Visit | Attending: Orthopedic Surgery | Admitting: Orthopedic Surgery

## 2017-10-09 ENCOUNTER — Encounter (HOSPITAL_COMMUNITY)
Admission: RE | Disposition: A | Discharge status: Home / Self Care | Payer: Self-pay | Source: Ambulatory Visit | Attending: Orthopedic Surgery

## 2017-10-09 ENCOUNTER — Other Ambulatory Visit: Payer: Self-pay

## 2017-10-09 DIAGNOSIS — Z923 Personal history of irradiation: Secondary | ICD-10-CM | POA: Diagnosis not present

## 2017-10-09 DIAGNOSIS — M549 Dorsalgia, unspecified: Secondary | ICD-10-CM | POA: Insufficient documentation

## 2017-10-09 DIAGNOSIS — E039 Hypothyroidism, unspecified: Secondary | ICD-10-CM | POA: Insufficient documentation

## 2017-10-09 DIAGNOSIS — Z880 Allergy status to penicillin: Secondary | ICD-10-CM | POA: Insufficient documentation

## 2017-10-09 DIAGNOSIS — K219 Gastro-esophageal reflux disease without esophagitis: Secondary | ICD-10-CM | POA: Insufficient documentation

## 2017-10-09 DIAGNOSIS — Z96659 Presence of unspecified artificial knee joint: Secondary | ICD-10-CM

## 2017-10-09 DIAGNOSIS — M1712 Unilateral primary osteoarthritis, left knee: Principal | ICD-10-CM | POA: Insufficient documentation

## 2017-10-09 DIAGNOSIS — I2510 Atherosclerotic heart disease of native coronary artery without angina pectoris: Secondary | ICD-10-CM | POA: Diagnosis not present

## 2017-10-09 DIAGNOSIS — G4700 Insomnia, unspecified: Secondary | ICD-10-CM | POA: Insufficient documentation

## 2017-10-09 DIAGNOSIS — I10 Essential (primary) hypertension: Secondary | ICD-10-CM | POA: Diagnosis not present

## 2017-10-09 DIAGNOSIS — E7800 Pure hypercholesterolemia, unspecified: Secondary | ICD-10-CM | POA: Diagnosis not present

## 2017-10-09 DIAGNOSIS — G8929 Other chronic pain: Secondary | ICD-10-CM | POA: Insufficient documentation

## 2017-10-09 DIAGNOSIS — Z7982 Long term (current) use of aspirin: Secondary | ICD-10-CM | POA: Insufficient documentation

## 2017-10-09 HISTORY — PX: TOTAL KNEE ARTHROPLASTY: SHX125

## 2017-10-09 SURGERY — ARTHROPLASTY, KNEE, TOTAL
Anesthesia: Spinal | Site: Knee | Laterality: Left

## 2017-10-09 MED ORDER — PANTOPRAZOLE SODIUM 40 MG PO TBEC: 40.0000 mg | DELAYED_RELEASE_TABLET | Freq: Every day | ORAL | Status: DC

## 2017-10-09 MED ORDER — FENTANYL CITRATE (PF) 100 MCG/2ML IJ SOLN
50.0000 ug | INTRAMUSCULAR | Status: DC
  Administered 2017-10-09: 50 ug via INTRAVENOUS
  Filled 2017-10-09: qty 2

## 2017-10-09 MED ORDER — SODIUM CHLORIDE 0.9 % IV SOLN
INTRAVENOUS | Status: DC
  Administered 2017-10-09: 16:00:00 via INTRAVENOUS

## 2017-10-09 MED ORDER — LEVOTHYROXINE SODIUM 88 MCG PO TABS
88.0000 ug | ORAL_TABLET | Freq: Every day | ORAL | Status: DC
  Administered 2017-10-10: 88 ug via ORAL
  Filled 2017-10-09: qty 1

## 2017-10-09 MED ORDER — MORPHINE SULFATE 15 MG PO TABS
15.0000 mg | ORAL_TABLET | Freq: Four times a day (QID) | ORAL | Status: DC | PRN
  Administered 2017-10-09 – 2017-10-10 (×2): 15 mg via ORAL
  Filled 2017-10-09 (×2): qty 1

## 2017-10-09 MED ORDER — MORPHINE SULFATE ER 100 MG PO TBCR
100.0000 mg | EXTENDED_RELEASE_TABLET | Freq: Three times a day (TID) | ORAL | Status: DC
  Administered 2017-10-10: 100 mg via ORAL
  Filled 2017-10-09: qty 1

## 2017-10-09 MED ORDER — ALUM & MAG HYDROXIDE-SIMETH 200-200-20 MG/5ML PO SUSP: 30.0000 mL | ORAL | Status: DC | PRN

## 2017-10-09 MED ORDER — GABAPENTIN 300 MG PO CAPS
300.0000 mg | ORAL_CAPSULE | Freq: Three times a day (TID) | ORAL | Status: DC
  Administered 2017-10-09 – 2017-10-10 (×3): 300 mg via ORAL
  Filled 2017-10-09 (×3): qty 1

## 2017-10-09 MED ORDER — MENTHOL 3 MG MT LOZG: 1.0000 | LOZENGE | OROMUCOSAL | Status: DC | PRN

## 2017-10-09 MED ORDER — FLEET ENEMA 7-19 GM/118ML RE ENEM: 1.0000 | ENEMA | Freq: Once | RECTAL | Status: DC | PRN

## 2017-10-09 MED ORDER — BUPIVACAINE-EPINEPHRINE (PF) 0.25% -1:200000 IJ SOLN
INTRAMUSCULAR | Status: AC
  Filled 2017-10-09: qty 30

## 2017-10-09 MED ORDER — METOCLOPRAMIDE HCL 5 MG PO TABS: 5.0000 mg | ORAL_TABLET | Freq: Three times a day (TID) | ORAL | Status: DC | PRN

## 2017-10-09 MED ORDER — DEXAMETHASONE SODIUM PHOSPHATE 10 MG/ML IJ SOLN
INTRAMUSCULAR | Status: AC
  Filled 2017-10-09: qty 1

## 2017-10-09 MED ORDER — HYDROCHLOROTHIAZIDE 25 MG PO TABS
25.0000 mg | ORAL_TABLET | Freq: Every day | ORAL | Status: DC
  Administered 2017-10-09 – 2017-10-10 (×2): 25 mg via ORAL
  Filled 2017-10-09 (×2): qty 1

## 2017-10-09 MED ORDER — TRANEXAMIC ACID 1000 MG/10ML IV SOLN
1000.0000 mg | Freq: Once | INTRAVENOUS | Status: AC
  Administered 2017-10-09: 1000 mg via INTRAVENOUS
  Filled 2017-10-09: qty 1000

## 2017-10-09 MED ORDER — ASPIRIN EC 325 MG PO TBEC
325.0000 mg | DELAYED_RELEASE_TABLET | Freq: Two times a day (BID) | ORAL | Status: DC
  Administered 2017-10-10: 325 mg via ORAL
  Filled 2017-10-09: qty 1

## 2017-10-09 MED ORDER — LACTATED RINGERS IV SOLN
INTRAVENOUS | Status: DC
  Administered 2017-10-09 (×2): via INTRAVENOUS

## 2017-10-09 MED ORDER — BISACODYL 5 MG PO TBEC: 5.0000 mg | DELAYED_RELEASE_TABLET | Freq: Every day | ORAL | Status: DC | PRN

## 2017-10-09 MED ORDER — PROMETHAZINE HCL 25 MG/ML IJ SOLN: 6.2500 mg | INTRAMUSCULAR | Status: DC | PRN

## 2017-10-09 MED ORDER — ONDANSETRON HCL 4 MG PO TABS: 4.0000 mg | ORAL_TABLET | Freq: Four times a day (QID) | ORAL | Status: DC | PRN

## 2017-10-09 MED ORDER — BUPIVACAINE IN DEXTROSE 0.75-8.25 % IT SOLN
INTRATHECAL | Status: DC | PRN
  Administered 2017-10-09: 1.6 mL via INTRATHECAL

## 2017-10-09 MED ORDER — HYDROMORPHONE HCL 1 MG/ML IJ SOLN
0.5000 mg | INTRAMUSCULAR | Status: DC | PRN
  Administered 2017-10-09 (×2): 0.5 mg via INTRAVENOUS
  Filled 2017-10-09: qty 1

## 2017-10-09 MED ORDER — SENNOSIDES-DOCUSATE SODIUM 8.6-50 MG PO TABS: 1.0000 | ORAL_TABLET | Freq: Every evening | ORAL | Status: DC | PRN

## 2017-10-09 MED ORDER — ROPIVACAINE HCL 7.5 MG/ML IJ SOLN
INTRAMUSCULAR | Status: DC | PRN
  Administered 2017-10-09: 20 mL via PERINEURAL

## 2017-10-09 MED ORDER — SODIUM CHLORIDE 0.9 % IJ SOLN
INTRAMUSCULAR | Status: AC
  Filled 2017-10-09: qty 20

## 2017-10-09 MED ORDER — PROPOFOL 500 MG/50ML IV EMUL
INTRAVENOUS | Status: DC | PRN
  Administered 2017-10-09: 40 ug/kg/min via INTRAVENOUS

## 2017-10-09 MED ORDER — MIDAZOLAM HCL 2 MG/2ML IJ SOLN
1.0000 mg | INTRAMUSCULAR | Status: DC
  Administered 2017-10-09: 1 mg via INTRAVENOUS
  Filled 2017-10-09: qty 2

## 2017-10-09 MED ORDER — BUPIVACAINE-EPINEPHRINE 0.25% -1:200000 IJ SOLN
INTRAMUSCULAR | Status: DC | PRN
  Administered 2017-10-09: 30 mL

## 2017-10-09 MED ORDER — AMLODIPINE BESYLATE 5 MG PO TABS
5.0000 mg | ORAL_TABLET | Freq: Every day | ORAL | Status: DC
  Administered 2017-10-09 – 2017-10-10 (×2): 5 mg via ORAL
  Filled 2017-10-09 (×2): qty 1

## 2017-10-09 MED ORDER — PROPOFOL 10 MG/ML IV BOLUS
INTRAVENOUS | Status: AC
  Filled 2017-10-09: qty 40

## 2017-10-09 MED ORDER — DOCUSATE SODIUM 100 MG PO CAPS
100.0000 mg | ORAL_CAPSULE | Freq: Two times a day (BID) | ORAL | Status: DC
  Administered 2017-10-09 – 2017-10-10 (×2): 100 mg via ORAL
  Filled 2017-10-09 (×2): qty 1

## 2017-10-09 MED ORDER — PHENOL 1.4 % MT LIQD: 1.0000 | OROMUCOSAL | Status: DC | PRN

## 2017-10-09 MED ORDER — FERROUS SULFATE 325 (65 FE) MG PO TABS
325.0000 mg | ORAL_TABLET | Freq: Three times a day (TID) | ORAL | Status: DC
  Administered 2017-10-09 – 2017-10-10 (×2): 325 mg via ORAL
  Filled 2017-10-09 (×2): qty 1

## 2017-10-09 MED ORDER — PANTOPRAZOLE SODIUM 40 MG PO TBEC
40.0000 mg | DELAYED_RELEASE_TABLET | Freq: Every day | ORAL | Status: DC
  Administered 2017-10-09 – 2017-10-10 (×2): 40 mg via ORAL
  Filled 2017-10-09 (×2): qty 1

## 2017-10-09 MED ORDER — ROSUVASTATIN CALCIUM 20 MG PO TABS
20.0000 mg | ORAL_TABLET | ORAL | Status: DC
  Administered 2017-10-09: 20 mg via ORAL
  Filled 2017-10-09: qty 1

## 2017-10-09 MED ORDER — CHLORHEXIDINE GLUCONATE 4 % EX LIQD: 60.0000 mL | Freq: Once | CUTANEOUS | Status: DC

## 2017-10-09 MED ORDER — SERTRALINE HCL 50 MG PO TABS
150.0000 mg | ORAL_TABLET | Freq: Every day | ORAL | Status: DC
  Administered 2017-10-09 – 2017-10-10 (×2): 150 mg via ORAL
  Filled 2017-10-09 (×2): qty 1

## 2017-10-09 MED ORDER — DIPHENHYDRAMINE HCL 12.5 MG/5ML PO ELIX: 12.5000 mg | ORAL_SOLUTION | ORAL | Status: DC | PRN

## 2017-10-09 MED ORDER — ACETAMINOPHEN 500 MG PO TABS
1000.0000 mg | ORAL_TABLET | Freq: Once | ORAL | Status: AC
  Administered 2017-10-09: 1000 mg via ORAL
  Filled 2017-10-09: qty 2

## 2017-10-09 MED ORDER — METHOCARBAMOL 500 MG PO TABS
500.0000 mg | ORAL_TABLET | Freq: Four times a day (QID) | ORAL | Status: DC | PRN
  Administered 2017-10-09 – 2017-10-10 (×3): 500 mg via ORAL
  Filled 2017-10-09 (×3): qty 1

## 2017-10-09 MED ORDER — DEXAMETHASONE SODIUM PHOSPHATE 10 MG/ML IJ SOLN: 10.0000 mg | Freq: Once | INTRAMUSCULAR | Status: DC

## 2017-10-09 MED ORDER — CLINDAMYCIN PHOSPHATE 600 MG/50ML IV SOLN
600.0000 mg | Freq: Four times a day (QID) | INTRAVENOUS | Status: AC
  Administered 2017-10-09 (×2): 600 mg via INTRAVENOUS
  Filled 2017-10-09 (×2): qty 50

## 2017-10-09 MED ORDER — HYDROMORPHONE HCL 1 MG/ML IJ SOLN: 0.2500 mg | INTRAMUSCULAR | Status: DC | PRN

## 2017-10-09 MED ORDER — CLONIDINE HCL (ANALGESIA) 100 MCG/ML EP SOLN
EPIDURAL | Status: DC | PRN
  Administered 2017-10-09: 50 ug

## 2017-10-09 MED ORDER — SODIUM CHLORIDE 0.9 % IR SOLN
Status: DC | PRN
  Administered 2017-10-09: 1000 mL

## 2017-10-09 MED ORDER — FENTANYL CITRATE (PF) 100 MCG/2ML IJ SOLN
INTRAMUSCULAR | Status: AC
  Filled 2017-10-09: qty 2

## 2017-10-09 MED ORDER — SODIUM CHLORIDE 0.9% FLUSH
INTRAVENOUS | Status: DC | PRN
  Administered 2017-10-09: 20 mL

## 2017-10-09 MED ORDER — ONDANSETRON HCL 4 MG/2ML IJ SOLN
INTRAMUSCULAR | Status: DC | PRN
  Administered 2017-10-09: 4 mg via INTRAVENOUS

## 2017-10-09 MED ORDER — CEFAZOLIN SODIUM-DEXTROSE 2-4 GM/100ML-% IV SOLN
2.0000 g | INTRAVENOUS | Status: AC
  Administered 2017-10-09: 2 g via INTRAVENOUS
  Filled 2017-10-09: qty 100

## 2017-10-09 MED ORDER — ACETAMINOPHEN 500 MG PO TABS
1000.0000 mg | ORAL_TABLET | Freq: Four times a day (QID) | ORAL | Status: AC
  Administered 2017-10-09 – 2017-10-10 (×4): 1000 mg via ORAL
  Filled 2017-10-09 (×5): qty 2

## 2017-10-09 MED ORDER — ONDANSETRON HCL 4 MG/2ML IJ SOLN: 4.0000 mg | Freq: Four times a day (QID) | INTRAMUSCULAR | Status: DC | PRN

## 2017-10-09 MED ORDER — LISINOPRIL 20 MG PO TABS
40.0000 mg | ORAL_TABLET | Freq: Every day | ORAL | Status: DC
  Administered 2017-10-10: 40 mg via ORAL
  Filled 2017-10-09: qty 2

## 2017-10-09 MED ORDER — GABAPENTIN 300 MG PO CAPS
300.0000 mg | ORAL_CAPSULE | Freq: Once | ORAL | Status: AC
  Administered 2017-10-09: 300 mg via ORAL
  Filled 2017-10-09: qty 1

## 2017-10-09 MED ORDER — DEXAMETHASONE SODIUM PHOSPHATE 10 MG/ML IJ SOLN: 8.0000 mg | Freq: Once | INTRAMUSCULAR | Status: DC

## 2017-10-09 MED ORDER — TRANEXAMIC ACID 1000 MG/10ML IV SOLN
1000.0000 mg | INTRAVENOUS | Status: AC
  Administered 2017-10-09: 1000 mg via INTRAVENOUS
  Filled 2017-10-09: qty 10

## 2017-10-09 MED ORDER — BUPIVACAINE LIPOSOME 1.3 % IJ SUSP
INTRAMUSCULAR | Status: DC | PRN
  Administered 2017-10-09: 20 mL

## 2017-10-09 MED ORDER — MIDAZOLAM HCL 2 MG/2ML IJ SOLN
INTRAMUSCULAR | Status: AC
  Filled 2017-10-09: qty 2

## 2017-10-09 MED ORDER — POTASSIUM CHLORIDE CRYS ER 10 MEQ PO TBCR
20.0000 meq | EXTENDED_RELEASE_TABLET | Freq: Every day | ORAL | Status: DC
  Administered 2017-10-09 – 2017-10-10 (×2): 20 meq via ORAL
  Filled 2017-10-09 (×3): qty 2

## 2017-10-09 MED ORDER — ZOLPIDEM TARTRATE 5 MG PO TABS: 5.0000 mg | ORAL_TABLET | Freq: Every evening | ORAL | Status: DC | PRN

## 2017-10-09 MED ORDER — ONDANSETRON HCL 4 MG/2ML IJ SOLN
INTRAMUSCULAR | Status: AC
  Filled 2017-10-09: qty 2

## 2017-10-09 MED ORDER — METOCLOPRAMIDE HCL 5 MG/ML IJ SOLN: 5.0000 mg | Freq: Three times a day (TID) | INTRAMUSCULAR | Status: DC | PRN

## 2017-10-09 MED ORDER — FENTANYL CITRATE (PF) 100 MCG/2ML IJ SOLN
INTRAMUSCULAR | Status: DC | PRN
  Administered 2017-10-09: 25 ug via INTRAVENOUS

## 2017-10-09 MED ORDER — METHOCARBAMOL 500 MG IVPB - SIMPLE MED
500.0000 mg | Freq: Four times a day (QID) | INTRAVENOUS | Status: DC | PRN
  Filled 2017-10-09: qty 50

## 2017-10-09 MED ORDER — TRAMADOL HCL 50 MG PO TABS
50.0000 mg | ORAL_TABLET | Freq: Four times a day (QID) | ORAL | Status: DC
  Administered 2017-10-09 – 2017-10-10 (×4): 50 mg via ORAL
  Filled 2017-10-09 (×4): qty 1

## 2017-10-09 SURGICAL SUPPLY — 59 items
ARTISURF 12M PLY L 3-5CD KNEE (Knees) ×2 IMPLANT
BAG SPEC THK2 15X12 ZIP CLS (MISCELLANEOUS) ×1
BAG ZIPLOCK 12X15 (MISCELLANEOUS) ×2 IMPLANT
BANDAGE ACE 6X5 VEL STRL LF (GAUZE/BANDAGES/DRESSINGS) ×2 IMPLANT
BLADE SAGITTAL 13X1.27X60 (BLADE) ×2 IMPLANT
BLADE SAW SGTL 83.5X18.5 (BLADE) ×2 IMPLANT
BLADE SURG 15 STRL LF DISP TIS (BLADE) ×1 IMPLANT
BLADE SURG 15 STRL SS (BLADE) ×2
BOWL SMART MIX CTS (DISPOSABLE) ×2 IMPLANT
BSPLAT TIB 5D C CMNT STM LT (Knees) ×1 IMPLANT
CEMENT BONE SIMPLEX SPEEDSET (Cement) ×4 IMPLANT
COMP FEM PERSONA STD SZ4 LT (Joint) ×2 IMPLANT
COMPONENT FEM PERSNA STD SZ4LT (Joint) ×1 IMPLANT
COVER SURGICAL LIGHT HANDLE (MISCELLANEOUS) ×2 IMPLANT
CUFF TOURN SGL QUICK 34 (TOURNIQUET CUFF) ×2
CUFF TRNQT CYL 34X4X40X1 (TOURNIQUET CUFF) ×1 IMPLANT
DECANTER SPIKE VIAL GLASS SM (MISCELLANEOUS) ×2 IMPLANT
DRAPE INCISE IOBAN 66X45 STRL (DRAPES) ×4 IMPLANT
DRAPE U-SHAPE 47X51 STRL (DRAPES) ×2 IMPLANT
DRSG AQUACEL AG ADV 3.5X10 (GAUZE/BANDAGES/DRESSINGS) ×2 IMPLANT
DURAPREP 26ML APPLICATOR (WOUND CARE) ×4 IMPLANT
ELECT REM PT RETURN 15FT ADLT (MISCELLANEOUS) ×2 IMPLANT
GLOVE BIOGEL M STRL SZ7.5 (GLOVE) ×2 IMPLANT
GLOVE BIOGEL PI IND STRL 7.5 (GLOVE) ×1 IMPLANT
GLOVE BIOGEL PI IND STRL 8.5 (GLOVE) ×1 IMPLANT
GLOVE BIOGEL PI INDICATOR 7.5 (GLOVE) ×1
GLOVE BIOGEL PI INDICATOR 8.5 (GLOVE) ×1
GLOVE SURG ORTHO 8.0 STRL STRW (GLOVE) ×4 IMPLANT
GOWN STRL REUS W/ TWL XL LVL3 (GOWN DISPOSABLE) ×1 IMPLANT
GOWN STRL REUS W/TWL 2XL LVL3 (GOWN DISPOSABLE) ×2 IMPLANT
GOWN STRL REUS W/TWL XL LVL3 (GOWN DISPOSABLE) ×1
HANDPIECE INTERPULSE COAX TIP (DISPOSABLE) ×2
HOLDER FOLEY CATH W/STRAP (MISCELLANEOUS) ×2 IMPLANT
HOOD PEEL AWAY FLYTE STAYCOOL (MISCELLANEOUS) ×8 IMPLANT
MANIFOLD NEPTUNE II (INSTRUMENTS) ×2 IMPLANT
NS IRRIG 1000ML POUR BTL (IV SOLUTION) ×2 IMPLANT
PACK TOTAL KNEE CUSTOM (KITS) ×2 IMPLANT
POSITIONER SURGICAL ARM (MISCELLANEOUS) ×2 IMPLANT
SET HNDPC FAN SPRY TIP SCT (DISPOSABLE) ×1 IMPLANT
SPONGE LAP 18X18 RF (DISPOSABLE) IMPLANT
STEM POLY PAT PLY 29M KNEE (Knees) ×2 IMPLANT
STEM TIBIA 5 DEG SZ C L KNEE (Knees) ×1 IMPLANT
STRIP CLOSURE SKIN 1/2X4 (GAUZE/BANDAGES/DRESSINGS) ×2 IMPLANT
SUT BONE WAX W31G (SUTURE) ×2 IMPLANT
SUT MNCRL AB 4-0 PS2 18 (SUTURE) ×2 IMPLANT
SUT STRATAFIX PDS+ 0 24IN (SUTURE) ×2 IMPLANT
SUT VIC AB 0 CT1 36 (SUTURE) ×2 IMPLANT
SUT VIC AB 1 CT1 27 (SUTURE) ×2
SUT VIC AB 1 CT1 27XBRD ANTBC (SUTURE) ×1 IMPLANT
SUT VIC AB 2-0 CT1 27 (SUTURE) ×1
SUT VIC AB 2-0 CT1 TAPERPNT 27 (SUTURE) ×1 IMPLANT
SYR CONTROL 10ML LL (SYRINGE) ×4 IMPLANT
TIBIA STEM 5 DEG SZ C L KNEE (Knees) ×2 IMPLANT
TRAY CATH 16FR W/PLASTIC CATH (SET/KITS/TRAYS/PACK) IMPLANT
TRAY FOLEY CATH 14FRSI W/METER (CATHETERS) ×2 IMPLANT
TRAY FOLEY MTR SLVR 16FR STAT (SET/KITS/TRAYS/PACK) IMPLANT
WATER STERILE IRR 1000ML POUR (IV SOLUTION) ×4 IMPLANT
WRAP KNEE MAXI GEL POST OP (GAUZE/BANDAGES/DRESSINGS) ×2 IMPLANT
YANKAUER SUCT BULB TIP 10FT TU (MISCELLANEOUS) ×2 IMPLANT

## 2017-10-09 NOTE — Progress Notes (Signed)
Assisted Dr. Rose with left, ultrasound guided, adductor canal block. Side rails up, monitors on throughout procedure. See vital signs in flow sheet. Tolerated Procedure well.  

## 2017-10-09 NOTE — Anesthesia Preprocedure Evaluation (Signed)
Anesthesia Evaluation  Patient identified by MRN, date of birth, ID band Patient awake    Reviewed: Allergy & Precautions, NPO status , Patient's Chart, lab work & pertinent test results  Airway Mallampati: II  TM Distance: >3 FB Neck ROM: Full    Dental no notable dental hx.    Pulmonary neg pulmonary ROS,    Pulmonary exam normal breath sounds clear to auscultation       Cardiovascular hypertension, Normal cardiovascular exam Rhythm:Regular Rate:Normal     Neuro/Psych negative neurological ROS  negative psych ROS   GI/Hepatic Neg liver ROS, GERD  Medicated,  Endo/Other  Hypothyroidism   Renal/GU negative Renal ROS  negative genitourinary   Musculoskeletal  (+) Arthritis , Osteoarthritis,    Abdominal   Peds negative pediatric ROS (+)  Hematology negative hematology ROS (+)   Anesthesia Other Findings   Reproductive/Obstetrics negative OB ROS                             Anesthesia Physical Anesthesia Plan  ASA: II  Anesthesia Plan: Spinal   Post-op Pain Management:  Regional for Post-op pain   Induction: Intravenous  PONV Risk Score and Plan: 3 and Ondansetron, Dexamethasone and Treatment may vary due to age or medical condition  Airway Management Planned: Simple Face Mask  Additional Equipment:   Intra-op Plan:   Post-operative Plan:   Informed Consent: I have reviewed the patients History and Physical, chart, labs and discussed the procedure including the risks, benefits and alternatives for the proposed anesthesia with the patient or authorized representative who has indicated his/her understanding and acceptance.   Dental advisory given  Plan Discussed with: CRNA and Surgeon  Anesthesia Plan Comments:         Anesthesia Quick Evaluation

## 2017-10-09 NOTE — Anesthesia Procedure Notes (Signed)
Anesthesia Procedure Image    

## 2017-10-09 NOTE — Evaluation (Addendum)
Physical Therapy Evaluation Patient Details Name: Laurie Hale MRN: [id number] DOB: Jul 13, 1940 Today's Date: 10/09/2017   History of Present Illness  77 y.o. female admitted on 10/10/15 for elective L TKA.  Pt with significant PMH of HTN, CAD, chrinic back pain s/p surgery, breast CA, R TKA (2013).    Clinical Impression  Pt is POD #0 and is limited by lightheadedness and low back pain/stiffness.  We were only able to get OOB to chair with RW.  Mod assist needed for transfer.  Knee exercise and precaution education initiated.   PT to follow acutely for deficits listed below.       Follow Up Recommendations Follow surgeon's recommendation for DC plan and follow-up therapies;Supervision for mobility/OOB    Equipment Recommendations  None recommended by PT    Recommendations for Other Services   NA    Precautions / Restrictions Precautions Precautions: Knee Precaution Booklet Issued: Yes (comment) Precaution Comments: knee exercise handout given and precautions reviewed.  Restrictions Weight Bearing Restrictions: Yes LLE Weight Bearing: Weight bearing as tolerated      Mobility  Bed Mobility Overal bed mobility: Needs Assistance Bed Mobility: Supine to Sit     Supine to sit: Min assist;HOB elevated     General bed mobility comments: Min assist to help progress left leg to EOB and support trunk to get to sitting.  HOB mildly elevated.   Transfers Overall transfer level: Needs assistance Equipment used: Rolling walker (2 wheeled) Transfers: Sit to/from [facility] Sit to Stand: Mod assist;From elevated surface Stand pivot transfers: Mod assist;From elevated surface       General transfer comment: Mod assist to support trunk over weak legs.  Verbal cues for safe hand placement and to stand tall once upright.  Mod assist to support trunk for balance to take 2-3 pivotal steps to the chair with RW. Pt limited by lightheadedness and weakness.           Balance Overall balance assessment: Needs assistance Sitting-balance support: Feet supported;Bilateral upper extremity supported Sitting balance-Leahy Scale: Fair     Standing balance support: Bilateral upper extremity supported Standing balance-Leahy Scale: Poor Standing balance comment: needs RW and therapist's support.                              Pertinent Vitals/Pain Pain Assessment: 0-10 Pain Score: 7  Pain Location: left knee and low back Pain Descriptors / Indicators: Aching;Burning Pain Intervention(s): Limited activity within patient's tolerance;Monitored during session;Repositioned;Ice applied    Home Living Family/patient expects to be discharged to:: Private residence Living Arrangements: Spouse/significant other Available Help at Discharge: Family;Available 24 hours/day Type of Home: House Home Access: Level entry     Home Layout: One level Home Equipment: Walker - 2 wheels;Cane - single point;Toilet riser      Prior Function Level of Independence: Needs assistance   Gait / Transfers Assistance Needed: Per pt report it was difficult to walk PTA and she could only go very short distanced.  She uses a cane.               Extremity/Trunk Assessment   Upper Extremity Assessment Upper Extremity Assessment: Generalized weakness    Lower Extremity Assessment Lower Extremity Assessment: LLE deficits/detail LLE Deficits / Details: left leg with normal post op pain and weakness.  Ankle at least 3/5, knee 2+/5, hip flexion 2+/5 per gross bed level assessment.  Pt able to feel lower leg and  thigh to light touch.  LLE Sensation: WNL    Cervical / Trunk Assessment Cervical / Trunk Assessment: Other exceptions Cervical / Trunk Exceptions: Pt with significant h/o low back pain takes meds daily for back pain.   Communication   Communication: No difficulties  Cognition Arousal/Alertness: Awake/alert Behavior During Therapy: WFL for tasks  assessed/performed Overall Cognitive Status: Within Functional Limits for tasks assessed                                           Exercises Total Joint Exercises Ankle Circles/Pumps: AROM;Both;20 reps Quad Sets: AROM;Left;10 reps Heel Slides: AAROM;Left;10 reps   Assessment/Plan    PT Assessment Patient needs continued PT services  PT Problem List Decreased strength;Decreased range of motion;Decreased activity tolerance;Decreased balance;Decreased mobility;Decreased knowledge of use of DME;Decreased knowledge of precautions;Pain       PT Treatment Interventions DME instruction;Gait training;Functional mobility training;Therapeutic activities;Therapeutic exercise;Balance training;Patient/family education;Manual techniques;Modalities    PT Goals (Current goals can be found in the Care Plan section)  Acute Rehab PT Goals Patient Stated Goal: to get back to pain free walking PT Goal Formulation: With patient Time For Goal Achievement: 10/23/17 Potential to Achieve Goals: Good    Frequency 7X/week    AM-PAC PT "6 Clicks" Daily Activity  Outcome Measure Difficulty turning over in bed (including adjusting bedclothes, sheets and blankets)?: Unable Difficulty moving from lying on back to sitting on the side of the bed? : Unable Difficulty sitting down on and standing up from a chair with arms (e.g., wheelchair, bedside commode, etc,.)?: Unable Help needed moving to and from a bed to chair (including a wheelchair)?: A Lot Help needed walking in hospital room?: A Lot Help needed climbing 3-5 steps with a railing? : A Lot 6 Click Score: 9    End of Session Equipment Utilized During Treatment: Gait belt Activity Tolerance: Patient limited by pain Patient left: in chair;with call bell/phone within reach;with family/visitor present Nurse Communication: Mobility status PT Visit Diagnosis: Muscle weakness (generalized) (M62.81);Difficulty in walking, not elsewhere  classified (R26.2);Pain Pain - Right/Left: Left Pain - part of body: Knee    Time: 7939-0300 PT Time Calculation (min) (ACUTE ONLY): 37 min   Charges:          Wells Guiles B. Edi Gorniak, PT, DPT [phone]   PT Evaluation $PT Eval Moderate Complexity: 1 Mod PT Treatments $Therapeutic Activity: 8-22 mins       10/09/2017, 5:59 PM

## 2017-10-09 NOTE — Interval H&P Note (Signed)
History and Physical Interval Note:  10/09/2017 10:56 AM  Laurie Hale  has presented today for surgery, with the diagnosis of Osteoarthritis LT. Knee  The various methods of treatment have been discussed with the patient and family. After consideration of risks, benefits and other options for treatment, the patient has consented to  Procedure(s): LEFT TOTAL KNEE ARTHROPLASTY (Left) as a surgical intervention .  The patient's history has been reviewed, patient examined, no change in status, stable for surgery.  I have reviewed the patient's chart and labs.  Questions were answered to the patient's satisfaction.     Eames Dibiasio,STEPHEN D

## 2017-10-09 NOTE — Anesthesia Procedure Notes (Signed)
Spinal  Patient location during procedure: OR Start time: 10/09/2017 11:09 AM End time: 10/09/2017 11:15 AM Staffing Anesthesiologist: Myrtie Soman, MD Performed: anesthesiologist  Preanesthetic Checklist Completed: patient identified, site marked, surgical consent, pre-op evaluation, timeout performed, IV checked, risks and benefits discussed and monitors and equipment checked Spinal Block Patient position: sitting Prep: ChloraPrep Patient monitoring: heart rate, continuous pulse ox and blood pressure Location: L3-4 Injection technique: single-shot Needle Needle type: Spinocan  Needle gauge: 22 G Needle length: 9 cm Additional Notes Expiration date of kit checked and confirmed. Patient tolerated procedure well, without complications.

## 2017-10-09 NOTE — Anesthesia Procedure Notes (Signed)
Anesthesia Regional Block: Adductor canal block   Pre-Anesthetic Checklist: ,, timeout performed, Correct Patient, Correct Site, Correct Laterality, Correct Procedure, Correct Position, site marked, Risks and benefits discussed,  Surgical consent,  Pre-op evaluation,  At surgeon's request and post-op pain management  Laterality: Left  Prep: chloraprep       Needles:  Injection technique: Single-shot  Needle Type: Stimiplex     Needle Length: 9cm      Additional Needles:   Procedures:,,,, ultrasound used (permanent image in chart),,,,  Narrative:  Start time: 10/09/2017 9:52 AM End time: 10/09/2017 10:00 AM Injection made incrementally with aspirations every 5 mL.  Performed by: Personally  Anesthesiologist: Myrtie Soman, MD  Additional Notes: Patient tolerated the procedure well without complications

## 2017-10-09 NOTE — Transfer of Care (Signed)
Immediate Anesthesia Transfer of Care Note  Patient: Laurie Hale  Procedure(s) Performed: LEFT TOTAL KNEE ARTHROPLASTY (Left Knee)  Patient Location: PACU  Anesthesia Type:Spinal  Level of Consciousness: awake, alert  and oriented  Airway & Oxygen Therapy: Patient Spontanous Breathing and Patient connected to face mask oxygen  Post-op Assessment: Report given to RN and Post -op Vital signs reviewed and stable  Post vital signs: Reviewed and stable  Last Vitals:  Vitals Value Taken Time  BP 121/68 10/09/2017  1:11 PM  Temp    Pulse 70 10/09/2017  1:14 PM  Resp 14 10/09/2017  1:14 PM  SpO2 93 % 10/09/2017  1:14 PM  Vitals shown include unvalidated device data.  Last Pain:  Vitals:   10/09/17 0826  TempSrc: Oral  PainSc:       Patients Stated Pain Goal: 4 (35/59/74 1638)  Complications: No apparent anesthesia complications

## 2017-10-09 NOTE — Plan of Care (Signed)
  Problem: Clinical Measurements: Goal: Ability to maintain clinical measurements within normal limits will improve Outcome: Progressing   Problem: Nutrition: Goal: Adequate nutrition will be maintained Outcome: Progressing   Problem: Coping: Goal: Level of anxiety will decrease Outcome: Progressing   

## 2017-10-10 ENCOUNTER — Encounter (HOSPITAL_COMMUNITY): Payer: Self-pay | Admitting: Orthopedic Surgery

## 2017-10-10 DIAGNOSIS — M1712 Unilateral primary osteoarthritis, left knee: Secondary | ICD-10-CM | POA: Diagnosis not present

## 2017-10-10 LAB — BASIC METABOLIC PANEL
ANION GAP: 6 (ref 5–15)
BUN: 19 mg/dL (ref 8–23)
CALCIUM: 8.7 mg/dL — AB (ref 8.9–10.3)
CHLORIDE: 105 mmol/L (ref 98–111)
CO2: 31 mmol/L (ref 22–32)
Creatinine, Ser: 0.8 mg/dL (ref 0.44–1.00)
GFR calc non Af Amer: >60 mL/min (ref >60)
GLUCOSE: 136 mg/dL — AB (ref 70–99)
POTASSIUM: 3.6 mmol/L (ref 3.5–5.1)
Sodium: 142 mmol/L (ref 135–145)

## 2017-10-10 LAB — CBC
HEMATOCRIT: 34 % — AB (ref 36.0–46.0)
Hemoglobin: 11.4 g/dL — ABNORMAL LOW (ref 12.0–15.0)
MCH: 30.6 pg (ref 26.0–34.0)
MCHC: 33.5 g/dL (ref 30.0–36.0)
MCV: 91.2 fL (ref 78.0–100.0)
Platelets: 185 10*3/uL (ref 150–400)
RBC: 3.73 MIL/uL — ABNORMAL LOW (ref 3.87–5.11)
RDW: 12.6 % (ref 11.5–15.5)
WBC: 10.3 10*3/uL (ref 4.0–10.5)

## 2017-10-10 MED ORDER — ASPIRIN 325 MG PO TBEC: 325.0000 mg | DELAYED_RELEASE_TABLET | Freq: Two times a day (BID) | ORAL | 0 refills | Status: DC

## 2017-10-10 MED ORDER — CARISOPRODOL 350 MG PO TABS: 350.0000 mg | ORAL_TABLET | Freq: Four times a day (QID) | ORAL | 0 refills | Status: DC

## 2017-10-10 NOTE — Progress Notes (Signed)
Patient claimed that around 1930 she took 1 tab of Morphine ER 100mg  which she brought it with her from home; husband was at bedside and witnessed the incident; health teaching given to pt and spouse regarding safe medication administration and verbalized understanding; Pharmacy informed and advised to count meds with pt; meds was brought down to pharmacy for safe keeping; recounted with 27tabs in bottle; Pharmacy receipt kept in shadow chart.

## 2017-10-10 NOTE — Progress Notes (Signed)
CSW consult-SNF Plan: Home, HHPT CSW will sign off.   Kathrin Greathouse, Marlinda Mike, MSW Clinical Social Worker  [phone] 10/10/2017  9:31 AM

## 2017-10-10 NOTE — Discharge Summary (Signed)
SPORTS MEDICINE & JOINT REPLACEMENT   Lara Mulch, MD   Laurie Shadow, PA-C Hale, Laurie, Laurie Hale  [id number]                             [phone]  PATIENT ID: Laurie Hale        MRN:  [id number]          DOB/AGE: 1940/08/01 / 77 y.o.    DISCHARGE SUMMARY  ADMISSION DATE:    10/09/2017 DISCHARGE DATE:   10/10/2017   ADMISSION DIAGNOSIS: Osteoarthritis LT. Knee    DISCHARGE DIAGNOSIS:  Osteoarthritis LT. Knee    ADDITIONAL DIAGNOSIS: Active Problems:   S/P total knee replacement  Past Medical History:  Diagnosis Date  . Arthritis   . 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 Cataract    immature bilateral  . Chronic back pain   . Constipation   . Coronary artery disease   . Diverticulosis   . GERD (gastroesophageal reflux disease)    takes a med daily-to bring with med name at surgery  . History of migraine    hasn't had one in yrs-per pt Zoloft stopped them  . Hyperlipidemia    takes Crestor and Niacin daily  . Hypertension    takes Amlodipine  and Lisinopril daily  . Hypokalemia    takes K Dur tid  . Hypothyroidism    takes Synthroid daily  . Insomnia    takes restoril prn  . Joint pain   . Joint swelling   . Peripheral edema    takes HCTZ daily  . Personal history of radiation therapy 2014   F/U lumpectomy   . Presence of pessary     PROCEDURE: Procedure(s): LEFT TOTAL KNEE ARTHROPLASTY on 10/09/2017  CONSULTS:    HISTORY:  See H&P in chart  HOSPITAL COURSE:  Laurie Hale is a 77 y.o. admitted on 10/09/2017 and found to have a diagnosis of Osteoarthritis LT. Knee.  After appropriate laboratory studies were obtained  they were taken to the operating room on 10/09/2017 and underwent Procedure(s): LEFT TOTAL KNEE ARTHROPLASTY.   They were given perioperative antibiotics:  Anti-infectives (From admission, onward)   Start     Dose/Rate Route Frequency Ordered Stop   10/09/17 1730  clindamycin (CLEOCIN) IVPB 600 mg     600 mg 100  mL/hr over 30 Minutes Intravenous Every 6 hours 10/09/17 1459 10/10/17 0000   10/09/17 0800  ceFAZolin (ANCEF) IVPB 2g/100 mL premix     2 g 200 mL/hr over 30 Minutes Intravenous On call to O.R. 10/09/17 3875 10/09/17 1136    .  Patient given tranexamic acid IV or topical and exparel intra-operatively.  Tolerated the procedure well.    POD# 1: Vital signs were stable.  Patient denied Chest pain, shortness of breath, or calf pain.  Patient was started on Aspirin twice daily at 8am.  Consults to PT, OT, and care management were made.  The patient was weight bearing as tolerated.  CPM was placed on the operative leg 0-90 degrees for 6-8 hours a day. When out of the CPM, patient was placed in the foam block to achieve full extension. Incentive spirometry was taught.  Dressing was changed.       POD #2, Continued  PT for ambulation and exercise program.  IV saline locked.  O2 discontinued.    The remainder of the hospital course was  dedicated to ambulation and strengthening.   The patient was discharged on 1 Day Post-Op in  Good condition.  Blood products given:none  DIAGNOSTIC STUDIES: Recent vital signs:  Patient Vitals for the past 24 hrs:  BP Temp Temp src Pulse Resp SpO2  10/10/17 0946 (!) 138/94 97.8 F (36.6 C) Oral 68 16 92 %  10/10/17 0540 134/73 (!) 97.5 F (36.4 C) Oral 68 - 93 %  10/10/17 0137 122/68 - - 66 - 96 %  10/09/17 2145 133/70 97.6 F (36.4 C) Oral 69 17 92 %  10/09/17 1811 138/70 98.1 F (36.7 C) Oral 76 16 96 %  10/09/17 1659 (!) 149/77 - - 71 14 93 %  10/09/17 1636 140/86 (!) 97.5 F (36.4 C) Oral 74 16 94 %  10/09/17 1500 (!) 159/85 97.7 F (36.5 C) Oral 76 - 96 %  10/09/17 1445 (!) 156/83 (!) 97.5 F (36.4 C) - 72 14 99 %  10/09/17 1430 (!) 143/77 - - 66 11 97 %  10/09/17 1415 (!) 147/75 - - 67 11 97 %  10/09/17 1400 (!) 146/69 (!) 97.5 F (36.4 C) - 67 14 98 %  10/09/17 1330 134/69 - - 66 12 94 %  10/09/17 1315 126/65 - - 69 15 93 %  10/09/17 1312  121/68 (!) 97.5 F (36.4 C) - 69 18 96 %       Recent laboratory studies: Recent Labs    10/10/17 0415  WBC 10.3  HGB 11.4*  HCT 34.0*  PLT 185   Recent Labs    10/10/17 0415  NA 142  K 3.6  CL 105  CO2 31  BUN 19  CREATININE 0.80  GLUCOSE 136*  CALCIUM 8.7*   Lab Results  Component Value Date   INR 1.00 12/28/2011   INR 1.1 11/07/2006   INR 1.1 08/29/2006     Recent Radiographic Studies :  No results found.  DISCHARGE INSTRUCTIONS:   DISCHARGE MEDICATIONS:   Allergies as of 10/10/2017      Reactions   Ciprofloxacin    Penicillins Hives          Medication List    STOP taking these medications   meloxicam 7.5 MG tablet Commonly known as:  MOBIC     TAKE these medications   amLODipine 5 MG tablet Commonly known as:  NORVASC Take 1 tablet (5 mg total) by mouth daily. What changed:  when to take this   aspirin 325 MG EC tablet Take 1 tablet (325 mg total) by mouth 2 (two) times daily. What changed:    medication strength  how much to take  when to take this   carisoprodol 350 MG tablet Commonly known as:  SOMA Take 1 tablet (350 mg total) by mouth 4 (four) times daily.   hydrochlorothiazide 25 MG tablet Commonly known as:  HYDRODIURIL Take 1 tablet (25 mg total) by mouth daily.   levothyroxine 88 MCG tablet Commonly known as:  SYNTHROID, LEVOTHROID Take 88 mcg by mouth at bedtime.   lisinopril 40 MG tablet Commonly known as:  PRINIVIL,ZESTRIL Take 1 tablet (40 mg total) by mouth daily.   morphine 100 MG 12 hr tablet Commonly known as:  MS CONTIN Take 100 mg by mouth every 8 (eight) hours as needed for pain.   morphine 15 MG tablet Commonly known as:  MSIR Take 15 mg by mouth every 6 (six) hours as needed (for pain).   pantoprazole 40 MG tablet Commonly known as:  PROTONIX Take 1 tablet (40 mg total) by mouth daily. What changed:  when to take this   potassium chloride 10 MEQ tablet Commonly known as:  K-DUR Take 1 tablet  (10 mEq total) by mouth 3 (three) times daily. What changed:    when to take this  additional instructions   rosuvastatin 20 MG tablet Commonly known as:  CRESTOR Take 20 mg by mouth every Monday, Wednesday, and Friday.   sertraline 100 MG tablet Commonly known as:  ZOLOFT Take 1 tablet (100 mg total) by mouth daily. What changed:  how much to take            Durable Medical Equipment  (From admission, onward)         Start     Ordered   10/09/17 1500  DME Walker rolling  Once    Question:  Patient needs a walker to treat with the following condition  Answer:  S/P total knee replacement   10/09/17 1459   10/09/17 1500  DME 3 n 1  Once     10/09/17 1459   10/09/17 1500  DME Bedside commode  Once    Question:  Patient needs a bedside commode to treat with the following condition  Answer:  S/P total knee replacement   10/09/17 1459          FOLLOW UP VISIT:    DISPOSITION: HOME VS. SNF  CONDITION:  Good   Donia Ast 10/10/2017, 12:39 PM

## 2017-10-10 NOTE — Care Management CC44 (Signed)
Condition Code 44 Documentation Completed  Patient Details  Name: Laurie Hale MRN: [id number] Date of Birth: [birth year]   Condition Code 44 given:    Patient signature on Condition Code 44 notice:    Documentation of 2 MD's agreement:    Code 44 added to claim:       Guadalupe Maple, RN 10/10/2017, 11:19 AM

## 2017-10-10 NOTE — Care Management Note (Signed)
Case Management Note  Patient Details  Name: Laurie Hale MRN: [id number] Date of Birth: 12/03/1940  Subjective/Objective:       Discharge planning, spoke with patient and spouse at bedside. Have chosen [facility] for [facility] PT, evaluate and treat.    Action/Plan: Contacted [facility] for referral. Needs RW and 3n1, contacted [employer] to deliver to room. [phone]            Expected Discharge Date:                  Expected Discharge Plan:  [place]  In-House Referral:  NA  Discharge planning Services  CM Consult  Post Acute Care Choice:  Durable Medical Equipment, Home Health Choice offered to:  Patient, Spouse  DME Arranged:  3-N-1, Walker rolling DME Agency:  [employer]  HH Arranged:  PT [place]:  [facility] [employer]  Status of Service:  Completed, signed off  If discussed at [employer], dates discussed:    Additional Comments:  Guadalupe Maple, RN 10/10/2017, 9:44 AM

## 2017-10-10 NOTE — Anesthesia Postprocedure Evaluation (Signed)
Anesthesia Post Note  Patient: Laurie Hale  Procedure(s) Performed: LEFT TOTAL KNEE ARTHROPLASTY (Left Knee)     Patient location during evaluation: PACU Anesthesia Type: Spinal Level of consciousness: oriented and awake and alert Pain management: pain level controlled Vital Signs Assessment: post-procedure vital signs reviewed and stable Respiratory status: spontaneous breathing, respiratory function stable and patient connected to nasal cannula oxygen Cardiovascular status: blood pressure returned to baseline and stable Postop Assessment: no headache, no backache and no apparent nausea or vomiting Anesthetic complications: no    Last Vitals:  Vitals:   10/10/17 0540 10/10/17 0946  BP: 134/73 (!) 138/94  Pulse: 68 68  Resp:  16  Temp: (!) 36.4 C 36.6 C  SpO2: 93% 92%    Last Pain:  Vitals:   10/10/17 0946  TempSrc: Oral  PainSc:                  Jahking Lesser S

## 2017-10-10 NOTE — Progress Notes (Signed)
Physical Therapy Treatment Patient Details Name: Laurie Hale MRN: [id number] DOB: [birth year] Today's Date: 10/10/2017    History of Present Illness 77 y.o. female admitted on 10/10/15 for elective L TKA.  Pt with significant PMH of HTN, CAD, chronic back pain s/p surgery, breast CA, R TKA (2013).      PT Comments    Progressing with mobility. Pt and husband were anxious to leave immediately. Explained to pt that we should have one more session to ensure she is mobilizing safely and to complete education. Pt agreed to having a 2nd session if therapist would return before noon. Will plan to see a 2nd time this a.m. Made RN aware as well.     Follow Up Recommendations  Follow surgeon's recommendation for DC plan and follow-up therapies Supervision/Assistance - 24 hour     Equipment Recommendations  Rolling walker with 5" wheels(youth height)    Recommendations for Other Services       Precautions / Restrictions Precautions Precautions: Knee Precaution Comments: knee exercise handout given and precautions reviewed.  Restrictions Weight Bearing Restrictions: No LLE Weight Bearing: Weight bearing as tolerated    Mobility  Bed Mobility Overal bed mobility: Needs Assistance Bed Mobility: Supine to Sit     Supine to sit: Min guard;HOB elevated     General bed mobility comments: close guard for safety. Increased time. Cues for safety, technique.  Transfers Overall transfer level: Needs assistance Equipment used: Rolling walker (2 wheeled) Transfers: Sit to/from Stand Sit to Stand: Min assist         General transfer comment: VCs safety, technique, hand placement. Assist to rise, stabilize, control descent.   Ambulation/Gait Ambulation/Gait assistance: Min guard Gait Distance (Feet): 50 Feet Assistive device: Rolling walker (2 wheeled) Gait Pattern/deviations: Step-to pattern;Step-through pattern;Decreased stride length;Trunk flexed     General Gait Details: close  guard for safety. Cues for proper use of RW, posture, sequence. Slow gait speed. Pt tolerated distance well. She denied dizziness.    Stairs             Wheelchair Mobility    Modified Rankin (Stroke Patients Only)       Balance Overall balance assessment: Needs assistance         Standing balance support: Bilateral upper extremity supported Standing balance-Leahy Scale: Poor Standing balance comment: needs RW                            Cognition Arousal/Alertness: Awake/alert Behavior During Therapy: WFL for tasks assessed/performed Overall Cognitive Status: Within Functional Limits for tasks assessed                                        Exercises Total Joint Exercises Ankle Circles/Pumps: AROM;Both;10 reps Quad Sets: AROM;Left;10 reps;Both Heel Slides: AAROM;Left;10 reps;Supine Hip ABduction/ADduction: Left;10 reps;Supine;AROM Straight Leg Raises: AROM;Left;10 reps;Supine Long Arc Quad: AROM;Left;10 reps;Seated Goniometric ROM: ~10-70 degrees    General Comments        Pertinent Vitals/Pain Pain Assessment: 0-10 Pain Score: 6  Pain Location: left knee and low back Pain Descriptors / Indicators: Aching;Sore;Discomfort Pain Intervention(s): Monitored during session;Repositioned;RN gave pain meds during session    Home Living                      Prior Function  PT Goals (current goals can now be found in the care plan section) Progress towards PT goals: Progressing toward goals    Frequency    7X/week      PT Plan Current plan remains appropriate    Co-evaluation              AM-PAC PT "6 Clicks" Daily Activity  Outcome Measure  Difficulty turning over in bed (including adjusting bedclothes, sheets and blankets)?: A Little Difficulty moving from lying on back to sitting on the side of the bed? : A Little Difficulty sitting down on and standing up from a chair with arms (e.g.,  wheelchair, bedside commode, etc,.)?: Unable Help needed moving to and from a bed to chair (including a wheelchair)?: A Little Help needed walking in hospital room?: A Little Help needed climbing 3-5 steps with a railing? : A Lot 6 Click Score: 15    End of Session Equipment Utilized During Treatment: Gait belt Activity Tolerance: Patient tolerated treatment well Patient left: in bed;with call bell/phone within reach;with family/visitor present   PT Visit Diagnosis: Difficulty in walking, not elsewhere classified (R26.2);Pain;Other abnormalities of gait and mobility (R26.89) Pain - Right/Left: Left Pain - part of body: Knee     Time: 7616-0737 PT Time Calculation (min) (ACUTE ONLY): 29 min  Charges:  $Gait Training: 8-22 mins $Therapeutic Exercise: 8-22 mins                        Weston Anna, MPT Pager: [phone]

## 2017-10-10 NOTE — Progress Notes (Signed)
Physical Therapy Treatment Patient Details Name: Laurie Hale MRN: [id number] DOB: [birth year] Today's Date: 10/10/2017    History of Present Illness 77 y.o. female admitted on 10/10/15 for elective L TKA.  Pt with significant PMH of HTN, CAD, chrinic back pain s/p surgery, breast CA, R TKA (2013).      PT Comments    Pt and husband remain eager to d/c as soon as possible. Reviewed bed mobility, transfers, and gait training. Pt does not have any stairs to negotiate at home. All education completed. No questions/concerns from pt or spouse. Okay to d/c from PT standpoint.     Follow Up Recommendations  Follow surgeon's recommendation for DC plan and follow-up therapies Supervision/Assistance - 24 hour     Equipment Recommendations  Rolling walker with 5" wheels(youth height)    Recommendations for Other Services       Precautions / Restrictions Precautions Precautions: Knee Precaution Comments: knee exercise handout given and precautions reviewed.  Restrictions Weight Bearing Restrictions: No LLE Weight Bearing: Weight bearing as tolerated    Mobility  Bed Mobility Overal bed mobility: Needs Assistance Bed Mobility: Supine to Sit;Sit to Supine     Supine to sit: Min guard Sit to supine: Min assist   General bed mobility comments: Assist for L LE. Increased time. cues for safety, technique  Transfers Overall transfer level: Needs assistance Equipment used: Rolling walker (2 wheeled) Transfers: Sit to/from Stand Sit to Stand: Min assist         General transfer comment: VCs safety, technique, hand placement. Assist to rise, stabilize, control descent.   Ambulation/Gait Ambulation/Gait assistance: Min guard Gait Distance (Feet): 60 Feet Assistive device: Rolling walker (2 wheeled) Gait Pattern/deviations: Step-to pattern;Step-through pattern;Decreased stride length;Trunk flexed     General Gait Details: close guard for safety. Cues for proper use of RW, posture,  sequence. Slow gait speed. Pt tolerated distance well. She denied dizziness.    Stairs             Wheelchair Mobility    Modified Rankin (Stroke Patients Only)       Balance Overall balance assessment: Needs assistance         Standing balance support: Bilateral upper extremity supported Standing balance-Leahy Scale: Poor Standing balance comment: needs RW                            Cognition Arousal/Alertness: Awake/alert Behavior During Therapy: WFL for tasks assessed/performed Overall Cognitive Status: Within Functional Limits for tasks assessed                                        Exercises    General Comments        Pertinent Vitals/Pain Pain Assessment: 0-10 Pain Score: 7  Pain Location: left knee and low back Pain Descriptors / Indicators: Aching;Sore;Discomfort Pain Intervention(s): Monitored during session;Repositioned    Home Living                      Prior Function            PT Goals (current goals can now be found in the care plan section) Progress towards PT goals: Progressing toward goals    Frequency    7X/week      PT Plan Current plan remains appropriate    Co-evaluation  AM-PAC PT "6 Clicks" Daily Activity  Outcome Measure  Difficulty turning over in bed (including adjusting bedclothes, sheets and blankets)?: A Little Difficulty moving from lying on back to sitting on the side of the bed? : A Little Difficulty sitting down on and standing up from a chair with arms (e.g., wheelchair, bedside commode, etc,.)?: Unable Help needed moving to and from a bed to chair (including a wheelchair)?: A Little Help needed walking in hospital room?: A Little Help needed climbing 3-5 steps with a railing? : A Lot 6 Click Score: 15    End of Session Equipment Utilized During Treatment: Gait belt Activity Tolerance: Patient tolerated treatment well Patient left: in bed;with  call bell/phone within reach;with family/visitor present   PT Visit Diagnosis: Difficulty in walking, not elsewhere classified (R26.2);Pain;Other abnormalities of gait and mobility (R26.89) Pain - Right/Left: Left Pain - part of body: Knee     Time: 7096-4383 PT Time Calculation (min) (ACUTE ONLY): 8 min  Charges:  $Gait Training: 8-22 mins $Therapeutic Exercise: 8-22 mins                        Weston Anna, MPT Pager: [phone]

## 2017-10-10 NOTE — Progress Notes (Signed)
SPORTS MEDICINE AND JOINT REPLACEMENT  Lara Mulch, MD    Carlyon Shadow, PA-C [place], [place], [place]  [address]                             [phone]   PROGRESS NOTE  Subjective:  negative for Chest Pain  negative for Shortness of Breath  negative for Nausea/Vomiting   negative for Calf Pain  negative for Bowel Movement   Tolerating Diet: yes         Patient reports pain as 3 on 0-10 scale.    Objective: Vital signs in last 24 hours:    Patient Vitals for the past 24 hrs:  BP Temp Temp src Pulse Resp SpO2 Height Weight  10/10/17 0540 134/73 (!) 97.5 F (36.4 C) Oral 68 - 93 % - -  10/10/17 0137 122/68 - - 66 - 96 % - -  10/09/17 2145 133/70 97.6 F (36.4 C) Oral 69 17 92 % - -  10/09/17 1811 138/70 98.1 F (36.7 C) Oral 76 16 96 % - -  10/09/17 1659 (!) 149/77 - - 71 14 93 % - -  10/09/17 1636 140/86 (!) 97.5 F (36.4 C) Oral 74 16 94 % - -  10/09/17 1500 (!) 159/85 97.7 F (36.5 C) Oral 76 - 96 % - -  10/09/17 1445 (!) 156/83 (!) 97.5 F (36.4 C) - 72 14 99 % - -  10/09/17 1430 (!) 143/77 - - 66 11 97 % - -  10/09/17 1415 (!) 147/75 - - 67 11 97 % - -  10/09/17 1400 (!) 146/69 (!) 97.5 F (36.4 C) - 67 14 98 % - -  10/09/17 1330 134/69 - - 66 12 94 % - -  10/09/17 1315 126/65 - - 69 15 93 % - -  10/09/17 1312 121/68 (!) 97.5 F (36.4 C) - 69 18 96 % - -  10/09/17 1007 - - - 66 11 98 % - -  10/09/17 1006 - - - 66 12 99 % - -  10/09/17 1005 (!) 152/73 - - 70 15 100 % - -  10/09/17 1004 - - - 72 11 100 % - -  10/09/17 1003 - - - 73 17 100 % - -  10/09/17 1002 - - - 79 16 99 % - -  10/09/17 1001 - - - 79 12 100 % - -  10/09/17 1000 (!) 158/98 - - 78 13 100 % - -  10/09/17 0959 - - - 76 12 100 % - -  10/09/17 0958 - - - 74 16 100 % - -  10/09/17 0957 - - - 64 11 100 % - -  10/09/17 0956 - - - 62 (!) 9 100 % - -  10/09/17 0955 - - - 62 11 99 % - -  10/09/17 0954 - - - 64 11 99 % - -  10/09/17 0953 - - - 69 15 98 % - -  10/09/17 0952 - - -  65 15 96 % - -  10/09/17 0951 - - - 65 16 97 % - -  10/09/17 0950 - - - 65 11 97 % - -  10/09/17 0949 (!) 156/79 - - 65 14 92 % - -  10/09/17 0826 (!) 156/65 98 F (36.7 C) Oral 65 18 95 % - -  10/09/17 0805 - - - - - - 5' (1.524 m)  73.7 kg    @flow {1959:LAST@   Intake/Output from previous day:   08/26 0701 - 08/27 0700 In: 2942.5 [P.O.:420; I.V.:2522.5] Out: 1235 [Urine:1185]   Intake/Output this shift:   No intake/output data recorded.   Intake/Output      08/26 0701 - 08/27 0700 08/27 0701 - 08/28 0700   P.O. 420    I.V. (mL/kg) 2522.5 (34.2)    IV Piggyback 0    Total Intake(mL/kg) 2942.5 (39.9)    Urine (mL/kg/hr) 1185    Blood 50    Total Output 1235    Net +1707.5            LABORATORY DATA: Recent Labs    10/10/17 0415  WBC 10.3  HGB 11.4*  HCT 34.0*  PLT 185   Recent Labs    10/10/17 0415  NA 142  K 3.6  CL 105  CO2 31  BUN 19  CREATININE 0.80  GLUCOSE 136*  CALCIUM 8.7*   Lab Results  Component Value Date   INR 1.00 12/28/2011   INR 1.1 11/07/2006   INR 1.1 08/29/2006    Examination:  General appearance: alert, cooperative and no distress Extremities: extremities normal, atraumatic, no cyanosis or edema  Wound Exam: clean, dry, intact   Drainage:  None: wound tissue dry  Motor Exam: Quadriceps and Hamstrings Intact  Sensory Exam: Superficial Peroneal, Deep Peroneal and Tibial normal   Assessment:    1 Day Post-Op  Procedure(s) (LRB): LEFT TOTAL KNEE ARTHROPLASTY (Left)  ADDITIONAL DIAGNOSIS:  Active Problems:   S/P total knee replacement     Plan: Physical Therapy as ordered Weight Bearing as Tolerated (WBAT)  DVT Prophylaxis:  Aspirin  DISCHARGE PLAN: Home  DISCHARGE NEEDS: HHPT     Pt is doing well this morning. Possible D/C home today if cleared by PT and pain is under control. Will continue to follow.   Patient's anticipated LOS is less than 2 midnights, meeting these requirements: - Lives within 1 hour of  care - Has a competent adult at home to recover with post-op recover - NO history of  - Diabetes  - Coronary Artery Disease  - Heart failure  - Heart attack  - Stroke  - DVT/VTE  - Cardiac arrhythmia  - Respiratory Failure/COPD  - Renal failure  - Anemia  - Advanced Liver disease        Donia Ast 10/10/2017, 7:08 AM

## 2017-10-11 NOTE — Op Note (Signed)
TOTAL KNEE REPLACEMENT OPERATIVE NOTE:  10/09/2017  6:28 AM  PATIENT:  Laurie Hale  77 y.o. female  PRE-OPERATIVE DIAGNOSIS:  Osteoarthritis LT. Knee  POST-OPERATIVE DIAGNOSIS:  Osteoarthritis LT. Knee  PROCEDURE:  Procedure(s): LEFT TOTAL KNEE ARTHROPLASTY  SURGEON:  Surgeon(s): Vickey Huger, MD  PHYSICIAN ASSISTANT:  Nehemiah Massed, PA-C  ANESTHESIA:   spinal  SPECIMEN: None  COUNTS:  Correct  TOURNIQUET:   Total Tourniquet Time Documented: Thigh (Left) - 41 minutes Total: Thigh (Left) - 41 minutes   DICTATION:  Indication for procedure:    The patient is a 77 y.o. female who has failed conservative treatment for Osteoarthritis LT. Knee.  Informed consent was obtained prior to anesthesia. The risks versus benefits of the operation were explain and in a way the patient can, and did, understand.   On the implant demand matching protocol, this patient scored 10.  Therefore, this patient was not receive a polyethylene insert with vitamin E which is a high demand implant.  Description of procedure:     The patient was taken to the operating room and placed under anesthesia.  The patient was positioned in the usual fashion taking care that all body parts were adequately padded and/or protected.  A tourniquet was applied and the leg prepped and draped in the usual sterile fashion.  The extremity was exsanguinated with the esmarch and tourniquet inflated to 350 mmHg.  Pre-operative range of motion was normal.  The knee was in 5 degree of mild varus.  A midline incision approximately 6-7 inches long was made with a #10 blade.  A new blade was used to make a parapatellar arthrotomy going 2-3 cm into the quadriceps tendon, over the patella, and alongside the medial aspect of the patellar tendon.  A synovectomy was then performed with the #10 blade and forceps. I then elevated the deep MCL off the medial tibial metaphysis subperiosteally around to the semimembranosus attachment.     I everted the patella and used calipers to measure patellar thickness.  I used the reamer to ream down to appropriate thickness to recreate the native thickness.  I then removed excess bone with the rongeur and sagittal saw.  I used the appropriately sized template and drilled the three lug holes.  I then put the trial in place and measured the thickness with the calipers to ensure recreation of the native thickness.  The trial was then removed and the patella subluxed and the knee brought into flexion.  A homan retractor was place to retract and protect the patella and lateral structures.  A Z-retractor was place medially to protect the medial structures.  The extra-medullary alignment system was used to make cut the tibial articular surface perpendicular to the anamotic axis of the tibia and in 3 degrees of posterior slope.  The cut surface and alignment jig was removed.  I then used the intramedullary alignment guide to make a 6 valgus cut on the distal femur.  I then marked out the epicondylar axis on the distal femur.  The posterior condylar axis measured 3 degrees.  I then used the anterior referencing sizer and measured the femur to be a size 4.  The 4-In-1 cutting block was screwed into place in external rotation matching the posterior condylar angle, making our cuts perpendicular to the epicondylar axis.  Anterior, posterior and chamfer cuts were made with the sagittal saw.  The cutting block and cut pieces were removed.  A lamina spreader was placed in 90 degrees of  flexion.  The ACL, PCL, menisci, and posterior condylar osteophytes were removed.  A 12 mm spacer blocked was found to offer good flexion and extension gap balance after mild in degree releasing.   The scoop retractor was then placed and the femoral finishing block was pinned in place.  The small sagittal saw was used as well as the lug drill to finish the femur.  The block and cut surfaces were removed and the medullary canal hole  filled with autograft bone from the cut pieces.  The tibia was delivered forward in deep flexion and external rotation.  A size C tray was selected and pinned into place centered on the medial 1/3 of the tibial tubercle.  The reamer and keel was used to prepare the tibia through the tray.    I then trialed with the size 4 femur, size C tibia, a 12 mm insert and the 30 patella.  I had excellent flexion/extension gap balance, excellent patella tracking.  Flexion was full and beyond 120 degrees; extension was zero.  These components were chosen and the staff opened them to me on the back table while the knee was lavaged copiously and the cement mixed.  The soft tissue was infiltrated with 60cc of exparel 1.3% through a 21 gauge needle.  I cemented in the components and removed all excess cement.  The polyethylene tibial component was snapped into place and the knee placed in extension while cement was hardening.  The capsule was infilltrated with a 60cc exparel/marcaine/saline mixture.   Once the cement was hard, the tourniquet was let down.  Hemostasis was obtained.  The arthrotomy was closed using a #1 stratofix running suture.  The deep soft tissues were closed with #0 vicryls and the subcuticular layer closed with #2-0 vicryl.  The skin was reapproximated and closed with 3.0 Monocryl.  The wound was covered with steristrips, aquacel dressing, and a TED stocking.   The patient was then awakened, extubated, and taken to the recovery room in stable condition.  BLOOD LOSS:  643PI COMPLICATIONS:  None.  PLAN OF CARE: Admit for overnight observation  PATIENT DISPOSITION:  PACU - hemodynamically stable.   Delay start of Pharmacological VTE agent (>24hrs) due to surgical blood loss or risk of bleeding:  not applicable  Please fax a copy of this op note to my office at [phone] (please only include page 1 and 2 of the Case Information op note)

## 2017-10-20 ENCOUNTER — Telehealth: Payer: Self-pay

## 2017-10-20 ENCOUNTER — Ambulatory Visit: Payer: Medicare Other | Admitting: Physical Therapy

## 2017-10-20 NOTE — Telephone Encounter (Signed)
Copied from [place] [id number]. Topic: Appointment Scheduling - New Patient >> Oct 20, 2017  1:47 PM Jarold Motto, Fraser Din wrote:  New patient has been scheduled for your office. Provider: Chauncy Passy Date of Appointment:10/24/17  Route to department's PEC pool.

## 2017-10-23 ENCOUNTER — Encounter: Payer: Self-pay | Admitting: Physical Therapy

## 2017-10-23 ENCOUNTER — Other Ambulatory Visit: Payer: Self-pay

## 2017-10-23 ENCOUNTER — Ambulatory Visit: Payer: Medicare Other | Attending: Orthopedic Surgery | Admitting: Physical Therapy

## 2017-10-23 ENCOUNTER — Ambulatory Visit: Payer: Medicare Other | Admitting: Physical Therapy

## 2017-10-23 DIAGNOSIS — R262 Difficulty in walking, not elsewhere classified: Secondary | ICD-10-CM | POA: Diagnosis present

## 2017-10-23 DIAGNOSIS — M6281 Muscle weakness (generalized): Secondary | ICD-10-CM

## 2017-10-23 NOTE — Patient Instructions (Signed)
Straight Leg Raise    Bend one leg. Raise other leg _4-6 ___ inches with knee locked. Exhale and tighten thigh muscles while raising leg. Repeat with other leg. Repeat _10___ times. Do ____ sessions per day.  http://gt2.exer.us/270   Copyright  VHI. All rights reserved.  KNEE: Extension, Short Arc Quads - Supine    Place bolster under knees. Raise one leg until knee is straight. _10__ reps per set, __2_ sets per day, _7__ days per week   Copyright  VHI. All rights reserved.  Quad Set    With other leg bent, foot flat, slowly tighten muscles on thigh of straight leg while counting out loud to _3_. Repeat __10__ times. Do _2___ sessions per day.  http://gt2.exer.us/276   Copyright  VHI. All rights reserved.

## 2017-10-23 NOTE — Therapy (Signed)
[place] MAIN [facility] SERVICES [address] [place], [place], [address] Phone: [phone]   Fax:  [phone]  Physical Therapy Evaluation  Patient Details  Name: Laurie Hale MRN: [id number] Date of Birth: [birth year] Referring Provider: Vickey Huger, MD    Encounter Date: 10/23/2017  PT End of Session - 10/23/17 1412    Visit Number  1    Number of Visits  17    Date for PT Re-Evaluation  12/11/17    PT Start Time  0200    PT Stop Time  0300    PT Time Calculation (min)  60 min    Activity Tolerance  Patient tolerated treatment well;Patient limited by pain    Behavior During Therapy  [facility] for tasks assessed/performed       Past Medical History:  Diagnosis Date  . Arthritis   . 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 Cataract    immature bilateral  . Chronic back pain   . Constipation   . Coronary artery disease   . Diverticulosis   . GERD (gastroesophageal reflux disease)    takes a med daily-to bring with med name at surgery  . History of migraine    hasn't had one in yrs-per pt Zoloft stopped them  . Hyperlipidemia    takes Crestor and Niacin daily  . Hypertension    takes Amlodipine  and Lisinopril daily  . Hypokalemia    takes K Dur tid  . Hypothyroidism    takes Synthroid daily  . Insomnia    takes restoril prn  . Joint pain   . Joint swelling   . Peripheral edema    takes HCTZ daily  . Personal history of radiation therapy 2014   F/U lumpectomy   . Presence of pessary     Past Surgical History:  Procedure Laterality Date  . ABDOMINAL HYSTERECTOMY    . BACK SURGERY  2009  . BREAST BIOPSY Left 10/22/2012   Stereo - DCIS  . BREAST LUMPECTOMY Left 2014   DCIS. F/U radiation   . CARDIAC CATHETERIZATION  1999  . CATARACT EXTRACTION W/PHACO Right 10/05/2015   Procedure: CATARACT EXTRACTION PHACO AND INTRAOCULAR LENS PLACEMENT (IOC);  Surgeon: Ronnell Freshwater, MD;  Location: [place];   Service: Ophthalmology;  Laterality: Right;  RIGHT  . CATARACT EXTRACTION W/PHACO Left 11/16/2015   Procedure: CATARACT EXTRACTION PHACO AND INTRAOCULAR LENS PLACEMENT (IOC);  Surgeon: Ronnell Freshwater, MD;  Location: [place];  Service: Ophthalmology;  Laterality: Left;  LEFT  . COLONOSCOPY    . CORONARY ANGIOPLASTY     1 stent  . partial coloectomy  1995  . right knee arthroscopy  2013  . TONSILLECTOMY    . TOTAL KNEE ARTHROPLASTY  01/02/2012   RIGHT KNEE  . TOTAL KNEE ARTHROPLASTY  01/02/2012   Procedure: TOTAL KNEE ARTHROPLASTY;  Surgeon: Vickey Huger, MD;  Location: [place];  Service: Orthopedics;  Laterality: Right;  . TOTAL KNEE ARTHROPLASTY Left 10/09/2017   Procedure: LEFT TOTAL KNEE ARTHROPLASTY;  Surgeon: Vickey Huger, MD;  [facility];  Service: Orthopedics;  Laterality: Left;  with block    There were no vitals filed for this visit.   Subjective Assessment - 10/23/17 1402    Subjective  Patient is having 8/10 constant. She was walking with a SPC prior to surgery.     Patient is accompained by:  Family member    Pertinent History  patient  had surgery to left knee 10/09/17 and stayed in 1 night and then was DC home. She was ambulating with a SPC prior to surgery. She was walking at home with rollator in the house and outside of the house for 2 years. She has 1 step to get into her house. She had surgery on her back 10 years ago.     Limitations  Standing;Walking    Patient Stated Goals  to be able to walk without pain .     Currently in Pain?  Yes    Pain Score  8     Pain Location  Knee    Pain Orientation  Left    Pain Descriptors / Indicators  Aching    Pain Onset  More than a month ago    Pain Frequency  Constant    Aggravating Factors   walking    Pain Relieving Factors  pain medicine    Effect of Pain on Daily Activities  slows her down         [facility] PT Assessment - 10/23/17 1356      Assessment   Medical Diagnosis  TKR    Referring  Provider  Vickey Huger, MD     Onset Date/Surgical Date  10/09/17    Hand Dominance  Right    Prior Therapy  HHPT      Precautions   Precautions  None      Restrictions   Weight Bearing Restrictions  No      Balance Screen   Has the patient fallen in the past 6 months  No    Has the patient had a decrease in activity level because of a fear of falling?   Yes    Is the patient reluctant to leave their home because of a fear of falling?   No      Home Film/video editor residence    Living Arrangements  Spouse/significant other    Available Help at Discharge  Family    Type of [place] to enter    Entrance Stairs-Number of Steps  --   [place] - 2 wheels;Walker - 4 wheels;Cane - single point;Shower seat;Toilet riser      Prior Function   Level of Independence  Independent with basic ADLs;Independent with household mobility with device    Vocation  Retired    Leisure  --   TV      PAIN: 8/10 left knee  POSTURE: WNL   PROM/AROM: -10 deg to 90 degs  AROM   STRENGTH:  Graded on a 0-5 scale Muscle Group Left Right                          Hip Flex 3/5 4/5  Hip Abd 3/5 4/5  Hip Add 2/5 4/5  Hip Ext NT  nt      Knee Flex 4/5 4/5  Knee Ext 4/5 4/5  Ankle DF 5/5 4/5  Ankle PF 4/5 4/5   SENSATION: WNL    FUNCTIONAL MOBILITY: Independent transfers sit to stand with slow mobility and need of UE support   BALANCE:  Standing Dynamic Balance  Normal Stand independently unsupported, able to weight shift and cross midline maximally   Good Stand independently unsupported, able to weight shift and cross midline moderately   Good-/Fair+ Stand  independently unsupported, able to weight shift across midline minimally   Fair Stand independently unsupported, weight shift, and reach ipsilaterally, loss of balance when crossing midline x  Poor+ Able to stand with Min A and  reach ipsilaterally, unable to weight shift   Poor Able to stand with Mod A and minimally reach ipsilaterally, unable to cross midline.      GAIT: Patient ambulates with slow gait speed and spc for short and intermediate distances.  OUTCOME MEASURES: TEST Outcome Interpretation  5 times sit<>stand 36.69 sec >60 yo, >15 sec indicates increased risk for falls  10 meter walk test    .29             m/s <1.0 m/s indicates increased risk for falls; limited community ambulator  Timed up and Go   34.02              sec <14 sec indicates increased risk for falls                          Objective measurements completed on examination: See above findings.              PT Education - 10/23/17 1411    Education Details  plan of care    Person(s) Educated  Spouse;Patient    Methods  Explanation    Comprehension  Verbalized understanding       PT Short Term Goals - 10/23/17 1515      PT SHORT TERM GOAL #1   Title  Patient will be independent in home exercise program to improve strength/mobility for better functional independence with ADLs.    Time  4    Period  Weeks    Status  New    Target Date  11/20/17      PT SHORT TERM GOAL #2   Title  Patient (< 70 years old) will complete five times sit to stand test in < 10 seconds indicating an increased LE strength and improved balance.    Time  4    Period  Weeks    Status  New    Target Date  11/20/17        PT Long Term Goals - 10/23/17 1518      PT LONG TERM GOAL #1   Title  Patient will increase 10 meter walk test to >1.74m/s as to improve gait speed for better community ambulation and to reduce fall risk.    Time  8    Period  Weeks    Status  New    Target Date  12/18/17      PT LONG TERM GOAL #2   Title  Patient will reduce timed up and go to <11 seconds to reduce fall risk and demonstrate improved transfer/gait ability.    Time  8    Period  Weeks    Status  New    Target Date  12/18/17      PT  LONG TERM GOAL #3   Title  Patient will report a worst pain of 3/10 on VAS in  left knee           to improve tolerance with ADLs and reduced symptoms with activities.     Baseline  8/10 left knee    Time  8    Period  Weeks    Status  New    Target Date  12/18/17      PT LONG TERM GOAL #  4   Title  Patient will increase BLE gross strength to 4+/5 as to improve functional strength for independent gait, increased standing tolerance and increased ADL ability.    Time  8    Period  Weeks    Status  New    Target Date  12/18/17             Plan - 10/23/17 1502    Clinical Impression Statement  Patient presents s/p left TKR and presents with decreased ROM -10 to 90 degs flex AROM and PROM -10 to 98 degs left knee. Patient has decreased strength to LLE , decreased gait speed with spc, decreased outcome measures including TUG and 5 x sit to stand. Patient will benefit from skilled PT to improve strength and ROM to improve quality of life and return to PLOF.    Clinical Decision Making  Moderate    Rehab Potential  Good    PT Frequency  2x / week    PT Duration  8 weeks    PT Treatment/Interventions  Gait training;Therapeutic exercise;Therapeutic activities;Functional mobility training;Stair training;Balance training;Patient/family education;Moist Heat;Cryotherapy;Neuromuscular re-education;Passive range of motion    Consulted and Agree with Plan of Care  Family member/caregiver;Patient    Family Member Consulted  husband       Patient will benefit from skilled therapeutic intervention in order to improve the following deficits and impairments:  Abnormal gait, Decreased balance, Decreased endurance, Decreased mobility, Difficulty walking, Decreased activity tolerance, Decreased safety awareness, Decreased strength, Impaired flexibility, Pain  Visit Diagnosis: Muscle weakness (generalized)  Difficulty walking     Problem List Patient Active Problem List   Diagnosis Date Noted  .  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 Dysuria 10/21/2013  . Nasal lesion 07/14/2013  . Fatigue 04/02/2013  . Breast cancer ([place]) 12/02/2012  . CAD (coronary artery disease) 09/15/2012  . Essential hypertension, benign 09/15/2012  . Hypercholesterolemia 09/15/2012  . Hypothyroidism 09/15/2012  . GERD (gastroesophageal reflux disease) 09/15/2012  . Diverticulosis 09/15/2012  . Chronic back pain 09/15/2012  . Hypokalemia 09/15/2012    Alanson Puls, PT DPT 10/23/2017, 3:47 PM  [place] MAIN [facility] SERVICES [address] [place], [place], [address] Phone: [phone]   Fax:  [phone]  Name: Laurie Hale MRN: [id number] Date of Birth: [birth year]

## 2017-10-24 ENCOUNTER — Ambulatory Visit (INDEPENDENT_AMBULATORY_CARE_PROVIDER_SITE_OTHER): Payer: Medicare Other | Admitting: Family Medicine

## 2017-10-24 ENCOUNTER — Encounter: Payer: Self-pay | Admitting: Family Medicine

## 2017-10-24 VITALS — BP 138/80 | HR 70 | Temp 98.3°F | Ht 60.0 in | Wt 160.6 lb

## 2017-10-24 DIAGNOSIS — M549 Dorsalgia, unspecified: Secondary | ICD-10-CM

## 2017-10-24 DIAGNOSIS — G8929 Other chronic pain: Secondary | ICD-10-CM

## 2017-10-24 DIAGNOSIS — E039 Hypothyroidism, unspecified: Secondary | ICD-10-CM | POA: Diagnosis not present

## 2017-10-24 DIAGNOSIS — N819 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 prolapse, unspecified: Secondary | ICD-10-CM

## 2017-10-24 DIAGNOSIS — F339 Major depressive disorder, recurrent, unspecified: Secondary | ICD-10-CM

## 2017-10-24 DIAGNOSIS — I10 Essential (primary) hypertension: Secondary | ICD-10-CM

## 2017-10-24 DIAGNOSIS — K219 Gastro-esophageal reflux disease without esophagitis: Secondary | ICD-10-CM

## 2017-10-24 DIAGNOSIS — E7800 Pure hypercholesterolemia, unspecified: Secondary | ICD-10-CM | POA: Diagnosis not present

## 2017-10-24 LAB — CBC
HEMATOCRIT: 34.9 % — AB (ref 36.0–46.0)
HEMOGLOBIN: 11.7 g/dL — AB (ref 12.0–15.0)
MCHC: 33.4 g/dL (ref 30.0–36.0)
MCV: 91.4 fl (ref 78.0–100.0)
Platelets: 349 10*3/uL (ref 150.0–400.0)
RBC: 3.82 Mil/uL — AB (ref 3.87–5.11)
RDW: 13.3 % (ref 11.5–15.5)
WBC: 8.2 10*3/uL (ref 4.0–10.5)

## 2017-10-24 LAB — COMPREHENSIVE METABOLIC PANEL
ALBUMIN: 4 g/dL (ref 3.5–5.2)
ALK PHOS: 68 U/L (ref 39–117)
ALT: 28 U/L (ref 0–35)
AST: 21 U/L (ref 0–37)
BUN: 17 mg/dL (ref 6–23)
CO2: 31 mEq/L (ref 19–32)
Calcium: 9.7 mg/dL (ref 8.4–10.5)
Chloride: 102 mEq/L (ref 96–112)
Creatinine, Ser: 0.85 mg/dL (ref 0.40–1.20)
GFR: 68.56 mL/min (ref >60.00)
Glucose, Bld: 91 mg/dL (ref 70–99)
POTASSIUM: 3.6 meq/L (ref 3.5–5.1)
Sodium: 140 mEq/L (ref 135–145)
TOTAL PROTEIN: 7.3 g/dL (ref 6.0–8.3)
Total Bilirubin: 0.4 mg/dL (ref 0.2–1.2)

## 2017-10-24 LAB — TSH: TSH: 4.36 u[IU]/mL (ref 0.35–4.50)

## 2017-10-24 LAB — LIPID PANEL
CHOLESTEROL: 214 mg/dL — AB (ref 0–200)
HDL: 38.1 mg/dL — AB (ref >39.00)
NonHDL: 175.97
Total CHOL/HDL Ratio: 6
Triglycerides: 239 mg/dL — ABNORMAL HIGH (ref 0.0–149.0)
VLDL: 47.8 mg/dL — AB (ref 0.0–40.0)

## 2017-10-24 LAB — LDL CHOLESTEROL, DIRECT: Direct LDL: 142 mg/dL

## 2017-10-24 NOTE — Progress Notes (Addendum)
Subjective:    Patient ID: Laurie Hale, female    DOB: Sep 02, 1940, 77 y.o.   MRN: [id number]  HPI   Patient presents to clinic to establish primary care.   BP is controlled with lisinopril, Crestor is taken for hyperlipdemia, levothyroxine for hypothyroidism. GERD controlled with protonix, and mood is stable on zoloft 150mg  dose.  She has chronic low back pain and her pain medications are prescribed by pain clinic.  She has not had lab work in approx 6 months.   Main concern today is pelvic organ prolapse. She has worn pessary for years, but states she lost it. Thinks it fell out when she used the toilet and she flushed it. When asked if she is sure it fell out, she seems to waiver, says she thinks she heard it hit the toilet bowl, but never actually saw it.    Patient Active Problem List   Diagnosis Date Noted  . 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 prolapse 10/24/2017  . 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 Dysuria 10/21/2013  . Nasal lesion 07/14/2013  . Fatigue 04/02/2013  . Breast cancer ([place]) 12/02/2012  . CAD (coronary artery disease) 09/15/2012  . Essential hypertension, benign 09/15/2012  . Hypercholesterolemia 09/15/2012  . Hypothyroidism 09/15/2012  . GERD (gastroesophageal reflux disease) 09/15/2012  . Diverticulosis 09/15/2012  . Chronic back pain 09/15/2012  . Hypokalemia 09/15/2012   Social History   Tobacco Use  . Smoking status: Never Smoker  . Smokeless tobacco: Never Used  Substance Use Topics  . Alcohol use: No   Family History  Problem Relation Age of Onset  . Hypertension Mother   . Sudden death Sister        x2  . Breast cancer Sister   . Heart disease Brother    Past Surgical History:  Procedure Laterality Date  . ABDOMINAL HYSTERECTOMY    . BACK SURGERY  2009  . BREAST BIOPSY Left 10/22/2012   Stereo - DCIS  . BREAST LUMPECTOMY Left 2014   DCIS. F/U radiation   . CARDIAC CATHETERIZATION  1999  . CATARACT EXTRACTION W/PHACO Right 10/05/2015   Procedure: CATARACT EXTRACTION PHACO AND INTRAOCULAR LENS PLACEMENT (IOC);  Surgeon: Ronnell Freshwater, MD;  Location: [place];  Service: Ophthalmology;  Laterality: Right;  RIGHT  . CATARACT EXTRACTION W/PHACO Left 11/16/2015   Procedure: CATARACT EXTRACTION PHACO AND INTRAOCULAR LENS PLACEMENT (IOC);  Surgeon: Ronnell Freshwater, MD;  Location: [place];  Service: Ophthalmology;  Laterality: Left;  LEFT  . COLONOSCOPY    . CORONARY ANGIOPLASTY     1 stent  . partial coloectomy  1995  . right knee arthroscopy  2013  . TONSILLECTOMY    . TOTAL KNEE ARTHROPLASTY  01/02/2012   RIGHT KNEE  . TOTAL KNEE ARTHROPLASTY  01/02/2012   Procedure: TOTAL KNEE ARTHROPLASTY;  Surgeon: Vickey Huger, MD;  Location: [place];  Service: Orthopedics;  Laterality: Right;  . TOTAL KNEE ARTHROPLASTY Left 10/09/2017   Procedure: LEFT TOTAL KNEE ARTHROPLASTY;  Surgeon: Vickey Huger, MD;  [facility];  Service: Orthopedics;  Laterality: Left;  with block    Review of Systems   Constitutional: Negative for chills, fatigue and fever.  HENT: Negative for congestion, ear pain, sinus pain and sore throat.   Eyes: Negative.   Respiratory: Negative for cough, shortness of breath and wheezing.   Cardiovascular: Negative for chest pain, palpitations and leg swelling.  Gastrointestinal: Negative for abdominal pain, diarrhea, nausea and vomiting.  Genitourinary: Negative for dysuria,  frequency and urgency. Lost pessary. Musculoskeletal: Negative for arthralgias and myalgias.  Skin: Negative for color change, pallor and rash.  Neurological: Negative for syncope, light-headedness and headaches.  Psychiatric/Behavioral: The patient is not nervous/anxious.       Objective:   Physical Exam  Constitutional: She is oriented to person, place, and time. She appears well-developed and well-nourished. No distress.  HENT:  Head: Normocephalic and atraumatic.  Eyes: EOM are normal. No scleral  icterus.  Neck: Neck supple. No tracheal deviation present.  Cardiovascular: Normal rate and regular rhythm.  Pulmonary/Chest: Effort normal. No respiratory distress. She has no rales.  Abdominal: Soft. Bowel sounds are normal. She exhibits no distension. There is no tenderness.  Genitourinary:  Genitourinary Comments: Patient refuses to allow me to do pelvic exam in clinic.  Neurological: She is alert and oriented to person, place, and time.  Walks with 4 wheeled walker due to chronic pain.   Skin: Skin is warm and dry. No pallor.  Psychiatric: She has a normal mood and affect. Her behavior is normal.  Nursing note and vitals reviewed.     Vitals:   10/24/17 0812  BP: 138/80  Pulse: 70  Temp: 98.3 F (36.8 C)  SpO2: 95%    Assessment & Plan:   Pelvic organ prolapse - Needs new pessary. Refuses pelvic exam in clinic, new referral to GYN placed  HTN -- BP stable. CMP ordered.   Lipids -- On crestor, new lipid panel ordered  Hypothyroid -- Currently on levothyroxine 68mcg, new TSH level ordered  GERD -- Stable on protonix  Depression -- Mood stable on Zoloft 150mg  dose  Chronic back pain -- Sees pain management  Declines flu vaccine today.  Follow up in 3 months. RTC sooner if issues arise.

## 2017-10-26 ENCOUNTER — Ambulatory Visit: Payer: Medicare Other | Admitting: Physical Therapy

## 2017-10-26 ENCOUNTER — Ambulatory Visit: Payer: Medicare Other

## 2017-10-26 DIAGNOSIS — R262 Difficulty in walking, not elsewhere classified: Secondary | ICD-10-CM

## 2017-10-26 DIAGNOSIS — M6281 Muscle weakness (generalized): Secondary | ICD-10-CM

## 2017-10-26 NOTE — Therapy (Signed)
[place] [facility]IN [facility] SERVICES [address] [place], [place], [address] Phone: [phone]   Fax:  [phone]  Physical Therapy Treatment  Patient Details  Name: Laurie Hale MRN: [id number] Date of Birth: 1940-09-10 Referring Provider: Vickey Huger, MD    Encounter Date: 10/26/2017  PT End of Session - 10/26/17 1309    Visit Number  2    Number of Visits  17    Date for PT Re-Evaluation  12/11/17    PT Start Time  1300    PT Stop Time  1345    PT Time Calculation (min)  45 min    Activity Tolerance  Patient tolerated treatment well;Patient limited by pain    Behavior During Therapy  [facility] for tasks assessed/performed       Past Medical History:  Diagnosis Date  . Arthritis   . 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 Cataract    immature bilateral  . Chronic back pain   . Constipation   . Coronary artery disease   . Diverticulosis   . GERD (gastroesophageal reflux disease)    takes a med daily-to bring with med name at surgery  . History of migraine    hasn't had one in yrs-per pt Zoloft stopped them  . Hyperlipidemia    takes Crestor and Niacin daily  . Hypertension    takes Amlodipine  and Lisinopril daily  . Hypokalemia    takes K Dur tid  . Hypothyroidism    takes Synthroid daily  . Insomnia    takes restoril prn  . Joint pain   . Joint swelling   . Peripheral edema    takes HCTZ daily  . Personal history of radiation therapy 2014   F/U lumpectomy   . Presence of pessary     Past Surgical History:  Procedure Laterality Date  . ABDOMINAL HYSTERECTOMY    . BACK SURGERY  2009  . BREAST BIOPSY Left 10/22/2012   Stereo - DCIS  . BREAST LUMPECTOMY Left 2014   DCIS. F/U radiation   . CARDIAC CATHETERIZATION  1999  . CATARACT EXTRACTION W/PHACO Right 10/05/2015   Procedure: CATARACT EXTRACTION PHACO AND INTRAOCULAR LENS PLACEMENT (IOC);  Surgeon: Ronnell Freshwater, MD;  Location: [place];   Service: Ophthalmology;  Laterality: Right;  RIGHT  . CATARACT EXTRACTION W/PHACO Left 11/16/2015   Procedure: CATARACT EXTRACTION PHACO AND INTRAOCULAR LENS PLACEMENT (IOC);  Surgeon: Ronnell Freshwater, MD;  Location: [place];  Service: Ophthalmology;  Laterality: Left;  LEFT  . COLONOSCOPY    . CORONARY ANGIOPLASTY     1 stent  . partial coloectomy  1995  . right knee arthroscopy  2013  . TONSILLECTOMY    . TOTAL KNEE ARTHROPLASTY  01/02/2012   RIGHT KNEE  . TOTAL KNEE ARTHROPLASTY  01/02/2012   Procedure: TOTAL KNEE ARTHROPLASTY;  Surgeon: Vickey Huger, MD;  Location: [place];  Service: Orthopedics;  Laterality: Right;  . TOTAL KNEE ARTHROPLASTY Left 10/09/2017   Procedure: LEFT TOTAL KNEE ARTHROPLASTY;  Surgeon: Vickey Huger, MD;  [facility]: [facility];  Service: Orthopedics;  Laterality: Left;  with block    There were no vitals filed for this visit.  Subjective Assessment - 10/26/17 1304    Subjective  Patient reports being a little dizzy today. Is present with her husband.  Patient reports pain is getting better. Patient reports she has been walking a lot but not done all her HEP.  Patient is accompained by:  Family member    Pertinent History  patient had surgery to left knee 10/09/17 and stayed in 1 night and then was DC home. She was ambulating with a SPC prior to surgery. She was walking at home with rollator in the house and outside of the house for 2 years. She has 1 step to get into her house. She had surgery on her back 10 years ago.     Limitations  Standing;Walking    Patient Stated Goals  to be able to walk without pain .     Currently in Pain?  Yes    Pain Score  5     Pain Location  Knee    Pain Orientation  Left    Pain Descriptors / Indicators  Aching    Pain Type  Surgical pain    Pain Onset  More than a month ago    Pain Frequency  Constant     Supine:  AROM: pain at end range.  Flexion:90  Extension: -14   Knee flexion and extension PROM to  improve knee mobility 41minutes. 3 points of contact for relaxation in supine position   Rhythmic rotation to promote relaxation and mobility of L knee in between ROM interventions  Ankle pumps 2x10   Contract/relax in supine pushing foot into shoulder moving into knee extension to promote knee mobility. Improved knee flexion with repletion noted 2x10  Heel slides 2x10 to improve ROM. Verbal cueing to bend knee as much as tolerated   Quad sets 3x10 with 3 second holds. Verbal and tactile cueing to squeeze quadriceps and promote activation   SAQ 2x10 verbal cuing to contract quad  SLR 2x10 verbal cueing to keep knee straight. Visual cueing for body mechanics  Scar mobilization to promote tissue extensibility   Ice cup massage 27minutes   education: compliance with HEP   End of session:  AROM: Flexion: 94 Extension: -7                     PT Education - 10/26/17 1309    Education Details  exercise technique, manual, HEP compliance    Person(s) Educated  Patient    Methods  Explanation;Demonstration;Verbal cues    Comprehension  Verbalized understanding;Returned demonstration;Verbal cues required;Tactile cues required       PT Short Term Goals - 10/23/17 1515      PT SHORT TERM GOAL #1   Title  Patient will be independent in home exercise program to improve strength/mobility for better functional independence with ADLs.    Time  4    Period  Weeks    Status  New    Target Date  11/20/17      PT SHORT TERM GOAL #2   Title  Patient (< 95 years old) will complete five times sit to stand test in < 10 seconds indicating an increased LE strength and improved balance.    Time  4    Period  Weeks    Status  New    Target Date  11/20/17        PT Long Term Goals - 10/23/17 1518      PT LONG TERM GOAL #1   Title  Patient will increase 10 meter walk test to >1.100m/s as to improve gait speed for better community ambulation and to reduce fall risk.    Time  8     Period  Weeks    Status  New  Target Date  12/18/17      PT LONG TERM GOAL #2   Title  Patient will reduce timed up and go to <11 seconds to reduce fall risk and demonstrate improved transfer/gait ability.    Time  8    Period  Weeks    Status  New    Target Date  12/18/17      PT LONG TERM GOAL #3   Title  Patient will report a worst pain of 3/10 on VAS in  left knee           to improve tolerance with ADLs and reduced symptoms with activities.     Baseline  8/10 left knee    Time  8    Period  Weeks    Status  New    Target Date  12/18/17      PT LONG TERM GOAL #4   Title  Patient will increase BLE gross strength to 4+/5 as to improve functional strength for independent gait, increased standing tolerance and increased ADL ability.    Time  8    Period  Weeks    Status  New    Target Date  12/18/17            Plan - 10/26/17 1438    Clinical Impression Statement  Patient increased knee flexion and extension ROM from -14 degrees and 90degress to -7degrees and 94 degrees within session demonstrating improvements in tissue extensibility with manual and focalized exercise techniques. Patient had difficulty relaxing during manual interventions which was combated with use of rhythmic rotations. Patient had increased sensitivity during ice cup massage and heat pad on abdomen was used to improve patient tolerance. Patient will continue to benefit from skilled physical therapy services to improve knee ROM, strength, and functional mobility to improve quality of life and return to PLOF.     Rehab Potential  Good    PT Frequency  2x / week    PT Duration  8 weeks    PT Treatment/Interventions  Gait training;Therapeutic exercise;Therapeutic activities;Functional mobility training;Stair training;Balance training;Patient/family education;Moist Heat;Cryotherapy;Neuromuscular re-education;Passive range of motion;Manual techniques;Scar mobilization    PT Next Visit Plan  mobility,  strength    Consulted and Agree with Plan of Care  Family member/caregiver;Patient    Family Member Consulted  husband       Patient will benefit from skilled therapeutic intervention in order to improve the following deficits and impairments:  Abnormal gait, Decreased balance, Decreased endurance, Decreased mobility, Difficulty walking, Decreased activity tolerance, Decreased safety awareness, Decreased strength, Impaired flexibility, Pain  Visit Diagnosis: Muscle weakness (generalized)  Difficulty walking     Problem List Patient Active Problem List   Diagnosis Date Noted  . 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 prolapse 10/24/2017  . Depression, recurrent ([place]) 10/24/2017  . 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 Dysuria 10/21/2013  . Nasal lesion 07/14/2013  . Fatigue 04/02/2013  . Breast cancer ([place]) 12/02/2012  . CAD (coronary artery disease) 09/15/2012  . Essential hypertension, benign 09/15/2012  . Hypercholesterolemia 09/15/2012  . Hypothyroidism 09/15/2012  . GERD (gastroesophageal reflux disease) 09/15/2012  . Diverticulosis 09/15/2012  . Chronic back pain 09/15/2012  . Hypokalemia 09/15/2012   Erick Blinks, SPT This entire session was performed under direct supervision and direction of a licensed therapist/therapist assistant . I have personally read, edited and approve of the note as written.  Janna Arch, PT, DPT   10/26/2017, 2:49 PM  [place] [facility] [phone]  [place], [place], [address] Phone: [phone]   Fax:  [phone]  Name: Laurie Hale MRN: [id number] Date of Birth: 1940/11/25

## 2017-10-30 ENCOUNTER — Encounter: Payer: Medicare Other | Admitting: Physical Therapy

## 2017-10-30 ENCOUNTER — Ambulatory Visit: Payer: Medicare Other | Admitting: Physical Therapy

## 2017-11-01 ENCOUNTER — Encounter: Payer: Self-pay | Admitting: Obstetrics & Gynecology

## 2017-11-02 ENCOUNTER — Encounter: Payer: Medicare Other | Admitting: Physical Therapy

## 2017-11-02 ENCOUNTER — Encounter: Payer: Self-pay | Admitting: Physical Therapy

## 2017-11-02 ENCOUNTER — Ambulatory Visit: Payer: Medicare Other | Admitting: Physical Therapy

## 2017-11-02 DIAGNOSIS — M6281 Muscle weakness (generalized): Secondary | ICD-10-CM

## 2017-11-02 DIAGNOSIS — R262 Difficulty in walking, not elsewhere classified: Secondary | ICD-10-CM

## 2017-11-02 NOTE — Therapy (Signed)
[place] [facility]IN [facility] SERVICES [address] [place], [place], [address] Phone: [phone]   Fax:  [phone]  Physical Therapy Treatment  Patient Details  Name: Laurie Hale MRN: [id number] Date of Birth: [birth year] Referring Provider: Vickey Huger, MD    Encounter Date: 11/02/2017  PT End of Session - 11/02/17 1201    Visit Number  3    Number of Visits  17    Date for PT Re-Evaluation  12/11/17    PT Start Time  1093    PT Stop Time  1230    PT Time Calculation (min)  45 min    Equipment Utilized During Treatment  Gait belt    Activity Tolerance  Patient tolerated treatment well;Patient limited by pain    Behavior During Therapy  [facility] for tasks assessed/performed       Past Medical History:  Diagnosis Date  . Arthritis   . 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 Cataract    immature bilateral  . Chronic back pain   . Constipation   . Coronary artery disease   . Diverticulosis   . GERD (gastroesophageal reflux disease)    takes a med daily-to bring with med name at surgery  . History of migraine    hasn't had one in yrs-per pt Zoloft stopped them  . Hyperlipidemia    takes Crestor and Niacin daily  . Hypertension    takes Amlodipine  and Lisinopril daily  . Hypokalemia    takes K Dur tid  . Hypothyroidism    takes Synthroid daily  . Insomnia    takes restoril prn  . Joint pain   . Joint swelling   . Peripheral edema    takes HCTZ daily  . Personal history of radiation therapy 2014   F/U lumpectomy   . Presence of pessary     Past Surgical History:  Procedure Laterality Date  . ABDOMINAL HYSTERECTOMY    . BACK SURGERY  2009  . BREAST BIOPSY Left 10/22/2012   Stereo - DCIS  . BREAST LUMPECTOMY Left 2014   DCIS. F/U radiation   . CARDIAC CATHETERIZATION  1999  . CATARACT EXTRACTION W/PHACO Right 10/05/2015   Procedure: CATARACT EXTRACTION PHACO AND INTRAOCULAR LENS PLACEMENT (IOC);  Surgeon: Ronnell Freshwater, MD;  Location: [place];  Service: Ophthalmology;  Laterality: Right;  RIGHT  . CATARACT EXTRACTION W/PHACO Left 11/16/2015   Procedure: CATARACT EXTRACTION PHACO AND INTRAOCULAR LENS PLACEMENT (IOC);  Surgeon: Ronnell Freshwater, MD;  Location: [place];  Service: Ophthalmology;  Laterality: Left;  LEFT  . COLONOSCOPY    . CORONARY ANGIOPLASTY     1 stent  . partial coloectomy  1995  . right knee arthroscopy  2013  . TONSILLECTOMY    . TOTAL KNEE ARTHROPLASTY  01/02/2012   RIGHT KNEE  . TOTAL KNEE ARTHROPLASTY  01/02/2012   Procedure: TOTAL KNEE ARTHROPLASTY;  Surgeon: Vickey Huger, MD;  Location: [place];  Service: Orthopedics;  Laterality: Right;  . TOTAL KNEE ARTHROPLASTY Left 10/09/2017   Procedure: LEFT TOTAL KNEE ARTHROPLASTY;  Surgeon: Vickey Huger, MD;  [facility]: [facility];  Service: Orthopedics;  Laterality: Left;  with block    There were no vitals filed for this visit.  Subjective Assessment - 11/02/17 1159    Subjective   Patient reports pain is getting better. Patient reports she has been walking a lot but not done all her HEP.  Patient is accompained by:  Family member    Pertinent History  patient had surgery to left knee 10/09/17 and stayed in 1 night and then was DC home. She was ambulating with a SPC prior to surgery. She was walking at home with rollator in the house and outside of the house for 2 years. She has 1 step to get into her house. She had surgery on her back 10 years ago.     Limitations  Standing;Walking    Patient Stated Goals  to be able to walk without pain .     Currently in Pain?  Yes    Pain Score  6     Pain Location  Knee    Pain Orientation  Left    Pain Descriptors / Indicators  Aching    Pain Type  Chronic pain    Pain Onset  More than a month ago    Pain Frequency  Constant    Aggravating Factors   activity     Pain Relieving Factors  nothing    Effect of Pain on Daily Activities  none     Multiple Pain Sites  No       Supine:  AROM: pain at end range.  Flexion:94  Extension: -7   Knee flexion and extension PROM in seated position   Seated knee flex AROM with 5 sec hold x 10   Ankle pumps 2x10   Heel slides 2x10 to improve ROM. Verbal cueing to bend knee as much as tolerated   Quad sets 3x10 with 3 second holds. Verbal and tactile cueing to squeeze quadriceps and promote activation   SAQ 2x10 verbal cuing to contract quad  SLR 2x10 verbal cueing to keep knee straight. Needs assist to raise leg initially and then she is able to perform it independently  Standing with LLE on second step with knee flex and 10 sec hold x 10 reps  Standing with LLE on second step and stretching into extension 30 sec x 3 reps    education: compliance with HEP  End of session:  AROM: Flexion: 97 Extension: -5                        PT Education - 11/02/17 1201    Education Details  HEP, ice    Person(s) Educated  Patient    Methods  Explanation;Demonstration;Verbal cues    Comprehension  Verbalized understanding;Returned demonstration;Need further instruction       PT Short Term Goals - 10/23/17 1515      PT SHORT TERM GOAL #1   Title  Patient will be independent in home exercise program to improve strength/mobility for better functional independence with ADLs.    Time  4    Period  Weeks    Status  New    Target Date  11/20/17      PT SHORT TERM GOAL #2   Title  Patient (< 64 years old) will complete five times sit to stand test in < 10 seconds indicating an increased LE strength and improved balance.    Time  4    Period  Weeks    Status  New    Target Date  11/20/17        PT Long Term Goals - 10/23/17 1518      PT LONG TERM GOAL #1   Title  Patient will increase 10 meter walk test to >1.66m/s as to improve gait speed for  better community ambulation and to reduce fall risk.    Time  8    Period  Weeks    Status  New    Target  Date  12/18/17      PT LONG TERM GOAL #2   Title  Patient will reduce timed up and go to <11 seconds to reduce fall risk and demonstrate improved transfer/gait ability.    Time  8    Period  Weeks    Status  New    Target Date  12/18/17      PT LONG TERM GOAL #3   Title  Patient will report a worst pain of 3/10 on VAS in  left knee           to improve tolerance with ADLs and reduced symptoms with activities.     Baseline  8/10 left knee    Time  8    Period  Weeks    Status  New    Target Date  12/18/17      PT LONG TERM GOAL #4   Title  Patient will increase BLE gross strength to 4+/5 as to improve functional strength for independent gait, increased standing tolerance and increased ADL ability.    Time  8    Period  Weeks    Status  New    Target Date  12/18/17            Plan - 11/02/17 1202    Clinical Impression Statement  Patient performs AROM, PROM  and therapeutic exericses to LLE knee with improvements in ROM -5 degs ext to 97 deg flex left knee.  She reports 6/10 pain and has slow antalgic gait. She was instructed in HEP and will continue to benefit from skilled PT to improve strength and mobiity.     Rehab Potential  Good    PT Frequency  2x / week    PT Duration  8 weeks    PT Treatment/Interventions  Gait training;Therapeutic exercise;Therapeutic activities;Functional mobility training;Stair training;Balance training;Patient/family education;Moist Heat;Cryotherapy;Neuromuscular re-education;Passive range of motion;Manual techniques;Scar mobilization    PT Next Visit Plan  mobility, strength    Consulted and Agree with Plan of Care  Family member/caregiver;Patient    Family Member Consulted  husband       Patient will benefit from skilled therapeutic intervention in order to improve the following deficits and impairments:  Abnormal gait, Decreased balance, Decreased endurance, Decreased mobility, Difficulty walking, Decreased activity tolerance, Decreased safety  awareness, Decreased strength, Impaired flexibility, Pain  Visit Diagnosis: Muscle weakness (generalized)  Difficulty walking     Problem List Patient Active Problem List   Diagnosis Date Noted  . 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 prolapse 10/24/2017  . Depression, recurrent ([place]) 10/24/2017  . 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 Dysuria 10/21/2013  . Nasal lesion 07/14/2013  . Fatigue 04/02/2013  . Breast cancer ([place]) 12/02/2012  . CAD (coronary artery disease) 09/15/2012  . Essential hypertension, benign 09/15/2012  . Hypercholesterolemia 09/15/2012  . Hypothyroidism 09/15/2012  . GERD (gastroesophageal reflux disease) 09/15/2012  . Diverticulosis 09/15/2012  . Chronic back pain 09/15/2012  . Hypokalemia 09/15/2012    Alanson Puls, [place] DPT 11/02/2017, 12:04 PM  [place] [facility]IN [facility] SERVICES [address] [place], [place], [address] Phone: [phone]   Fax:  [phone]  Name: Laurie Hale MRN: [id number] Date of Birth: 1940/11/28

## 2017-11-06 ENCOUNTER — Encounter: Payer: Medicare Other | Admitting: Physical Therapy

## 2017-11-07 ENCOUNTER — Ambulatory Visit: Payer: Medicare Other

## 2017-11-07 ENCOUNTER — Encounter: Payer: Self-pay | Admitting: Family Medicine

## 2017-11-07 DIAGNOSIS — M6281 Muscle weakness (generalized): Secondary | ICD-10-CM

## 2017-11-07 DIAGNOSIS — R262 Difficulty in walking, not elsewhere classified: Secondary | ICD-10-CM

## 2017-11-07 NOTE — Therapy (Addendum)
[place] MAIN [facility] SERVICES [address] [place], [place], [address] Phone: [phone]   Fax:  [phone]  Physical Therapy Treatment  Patient Details  Name: Laurie Hale MRN: [id number] Date of Birth: 10-15-1940 Referring Provider: Vickey Huger, MD    Encounter Date: 11/07/2017  PT End of Session - 11/07/17 1218    Visit Number  4    Number of Visits  17    Date for PT Re-Evaluation  12/11/17    PT Start Time  1112    PT Stop Time  1158    PT Time Calculation (min)  46 min    Equipment Utilized During Treatment  Gait belt    Activity Tolerance  Patient tolerated treatment well    Behavior During Therapy  [facility] for tasks assessed/performed       Past Medical History:  Diagnosis Date  . Arthritis   . 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 Cataract    immature bilateral  . Chronic back pain   . Constipation   . Coronary artery disease   . Diverticulosis   . GERD (gastroesophageal reflux disease)    takes a med daily-to bring with med name at surgery  . History of migraine    hasn't had one in yrs-per pt Zoloft stopped them  . Hyperlipidemia    takes Crestor and Niacin daily  . Hypertension    takes Amlodipine  and Lisinopril daily  . Hypokalemia    takes K Dur tid  . Hypothyroidism    takes Synthroid daily  . Insomnia    takes restoril prn  . Joint pain   . Joint swelling   . Peripheral edema    takes HCTZ daily  . Personal history of radiation therapy 2014   F/U lumpectomy   . Presence of pessary     Past Surgical History:  Procedure Laterality Date  . ABDOMINAL HYSTERECTOMY    . BACK SURGERY  2009  . BREAST BIOPSY Left 10/22/2012   Stereo - DCIS  . BREAST LUMPECTOMY Left 2014   DCIS. F/U radiation   . CARDIAC CATHETERIZATION  1999  . CATARACT EXTRACTION W/PHACO Right 10/05/2015   Procedure: CATARACT EXTRACTION PHACO AND INTRAOCULAR LENS PLACEMENT (IOC);  Surgeon: Ronnell Freshwater, MD;   Location: [place];  Service: Ophthalmology;  Laterality: Right;  RIGHT  . CATARACT EXTRACTION W/PHACO Left 11/16/2015   Procedure: CATARACT EXTRACTION PHACO AND INTRAOCULAR LENS PLACEMENT (IOC);  Surgeon: Ronnell Freshwater, MD;  Location: [place];  Service: Ophthalmology;  Laterality: Left;  LEFT  . COLONOSCOPY    . CORONARY ANGIOPLASTY     1 stent  . partial coloectomy  1995  . right knee arthroscopy  2013  . TONSILLECTOMY    . TOTAL KNEE ARTHROPLASTY  01/02/2012   RIGHT KNEE  . TOTAL KNEE ARTHROPLASTY  01/02/2012   Procedure: TOTAL KNEE ARTHROPLASTY;  Surgeon: Vickey Huger, MD;  Location: [place];  Service: Orthopedics;  Laterality: Right;  . TOTAL KNEE ARTHROPLASTY Left 10/09/2017   Procedure: LEFT TOTAL KNEE ARTHROPLASTY;  Surgeon: Vickey Huger, MD;  Location: [facility];  Service: Orthopedics;  Laterality: Left;  with block    There were no vitals filed for this visit.  Subjective Assessment - 11/07/17 1110    Subjective  Patient reports she is not in any pain and has not had to use ice the last couple of days. States she has done her HEP  some.     Patient is accompained by:  Family member    Pertinent History  patient had surgery to left knee 10/09/17 and stayed in 1 night and then was DC home. She was ambulating with a SPC prior to surgery. She was walking at home with rollator in the house and outside of the house for 2 years. She has 1 step to get into her house. She had surgery on her back 10 years ago.     Limitations  Standing;Walking    Patient Stated Goals  to be able to walk without pain .     Currently in Pain?  No/denies    Pain Score  0-No pain    Pain Onset  More than a month ago         Supine:  AROM: .  Flexion:100 Extension: -7   Nustep lvl 1  76minutes to promote knee mobility   Knee flexion and extension PROM in supine position 5 minutes ; PT assist/perform   Tibiofemoral AP on tibia with knee flexed in supine Grade III for 3  minutes to improve knee flexion ROM  Tibiofemoal AP on femur in supine with knee extended to promote knee extension ROM Grade 3; 2 minutes   Contract/relax in supine pushing foot into shoulder moving into knee extension to promote knee mobility. Improved knee flexion with repetition noted 2x10   Ankle pumps 2x10    Heel slides 2x10 to improve ROM. Verbal cueing to bend knee as much as tolerated    Quad sets 3x10 with 3 second holds. Verbal and tactile cueing to squeeze quadriceps and promote activation   SAQ 2x10 3 second holds verbal cuing to contract quad.    SLR 2x10 verbal cueing to keep knee straight.   LAQ seated 2x10 with 3 second holds. Verbal cues to contract quadriceps   Standing with LLE on second step with knee flex and 10 sec hold x 10 reps   Standing with LLE on second step and stretching into extension x10 sec holds x 10 reps   Stairs- ascend/descend 4 steps x2 with bilateral UE support. Step through pattern ascending and step to pattern descend. Verbal cues to prevent foot eversion descending and to descend with uninvolved leg. CGA  Intermittent walking in therapy gym between exercises to equipment. Verbal cues to increase weight unto LLE. CGA  education: compliance with HEP  End of session:  AROM: Flexion: 103 Extension: -5                       PT Education - 11/07/17 1109    Education Details  HEP, ice    Person(s) Educated  Patient    Methods  Explanation;Demonstration;Verbal cues    Comprehension  Verbalized understanding;Returned demonstration       PT Short Term Goals - 10/23/17 1515      PT SHORT TERM GOAL #1   Title  Patient will be independent in home exercise program to improve strength/mobility for better functional independence with ADLs.    Time  4    Period  Weeks    Status  New    Target Date  11/20/17      PT SHORT TERM GOAL #2   Title  Patient (< 61 years old) will complete five times sit to stand test in < 10  seconds indicating an increased LE strength and improved balance.    Time  4    Period  Weeks  Status  New    Target Date  11/20/17        PT Long Term Goals - 10/23/17 1518      PT LONG TERM GOAL #1   Title  Patient will increase 10 meter walk test to >1.61m/s as to improve gait speed for better community ambulation and to reduce fall risk.    Time  8    Period  Weeks    Status  New    Target Date  12/18/17      PT LONG TERM GOAL #2   Title  Patient will reduce timed up and go to <11 seconds to reduce fall risk and demonstrate improved transfer/gait ability.    Time  8    Period  Weeks    Status  New    Target Date  12/18/17      PT LONG TERM GOAL #3   Title  Patient will report a worst pain of 3/10 on VAS in  left knee           to improve tolerance with ADLs and reduced symptoms with activities.     Baseline  8/10 left knee    Time  8    Period  Weeks    Status  New    Target Date  12/18/17      PT LONG TERM GOAL #4   Title  Patient will increase BLE gross strength to 4+/5 as to improve functional strength for independent gait, increased standing tolerance and increased ADL ability.    Time  8    Period  Weeks    Status  New    Target Date  12/18/17            Plan - 11/07/17 1217    Clinical Impression Statement  Patient returns to physical therapy with improved knee flexion ROM (100 degrees) and no pain. ROM improved to 103 degrees flexion and -5 degrees extension in session demonstrating effectives of therapeutic interventions including manual therapy. Progressed exercises with incorporation of stairs and patient completed with step through pattern ascending demonstrating improved functional strength. Patient had no to minimum amount of pain with all exercises during and after session. Patient will continue to benefit from skilled physical therapy services to improve strength and mobility.     Rehab Potential  Good    PT Frequency  2x / week    PT Duration  8  weeks    PT Treatment/Interventions  Gait training;Therapeutic exercise;Therapeutic activities;Functional mobility training;Stair training;Balance training;Patient/family education;Moist Heat;Cryotherapy;Neuromuscular re-education;Passive range of motion;Manual techniques;Scar mobilization    PT Next Visit Plan  sit to stands, balance, strength, mobility    Consulted and Agree with Plan of Care  Patient       Patient will benefit from skilled therapeutic intervention in order to improve the following deficits and impairments:  Abnormal gait, Decreased balance, Decreased endurance, Decreased mobility, Difficulty walking, Decreased activity tolerance, Decreased safety awareness, Decreased strength, Impaired flexibility, Pain  Visit Diagnosis: Muscle weakness (generalized)  Difficulty walking     Problem List Patient Active Problem List   Diagnosis Date Noted  . 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 prolapse 10/24/2017  . Depression, recurrent ([place]) 10/24/2017  . 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 Dysuria 10/21/2013  . Nasal lesion 07/14/2013  . Fatigue 04/02/2013  . Breast cancer ([place]) 12/02/2012  . CAD (coronary artery disease) 09/15/2012  . Essential hypertension, benign 09/15/2012  . Hypercholesterolemia 09/15/2012  . Hypothyroidism 09/15/2012  . GERD (gastroesophageal reflux disease)  09/15/2012  . Diverticulosis 09/15/2012  . Chronic back pain 09/15/2012  . Hypokalemia 09/15/2012   Erick Blinks, SPT This entire session was performed under direct supervision and direction of a licensed therapist/therapist assistant . I have personally read, edited and approve of the note as written.   Janna Arch, PT, DPT   11/07/2017, 12:34 PM  [place] MAIN [facility] SERVICES [address] [place], [place], [address] Phone: [phone]   Fax:  [phone]  Name: Laurie Hale MRN: [id number] Date of Birth: July 21, 1940

## 2017-11-09 ENCOUNTER — Encounter: Payer: Medicare Other | Admitting: Physical Therapy

## 2017-11-09 ENCOUNTER — Ambulatory Visit: Payer: Medicare Other | Admitting: Physical Therapy

## 2017-11-13 ENCOUNTER — Encounter: Payer: Self-pay | Admitting: Physical Therapy

## 2017-11-13 ENCOUNTER — Ambulatory Visit: Payer: Medicare Other | Admitting: Physical Therapy

## 2017-11-13 ENCOUNTER — Encounter: Payer: Medicare Other | Admitting: Physical Therapy

## 2017-11-13 DIAGNOSIS — M6281 Muscle weakness (generalized): Secondary | ICD-10-CM | POA: Diagnosis not present

## 2017-11-13 NOTE — Therapy (Signed)
[place] MAIN [facility] SERVICES [address] [place], [place], [address] Phone: [phone]   Fax:  [phone]  Physical Therapy Treatment  Patient Details  Name: Laurie Hale MRN: [id number] Date of Birth: 28-Sep-1940 Referring Provider (PT): Vickey Huger, MD    Encounter Date: 11/13/2017  PT End of Session - 11/13/17 1614    Visit Number  5    Number of Visits  17    Date for PT Re-Evaluation  12/11/17    PT Start Time  0400    PT Stop Time  0445    PT Time Calculation (min)  45 min    Equipment Utilized During Treatment  Gait belt    Activity Tolerance  Patient tolerated treatment well    Behavior During Therapy  [facility] for tasks assessed/performed       Past Medical History:  Diagnosis Date  . Arthritis   . 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 Cataract    immature bilateral  . Chronic back pain   . Constipation   . Coronary artery disease   . Diverticulosis   . GERD (gastroesophageal reflux disease)    takes a med daily-to bring with med name at surgery  . History of migraine    hasn't had one in yrs-per pt Zoloft stopped them  . Hyperlipidemia    takes Crestor and Niacin daily  . Hypertension    takes Amlodipine  and Lisinopril daily  . Hypokalemia    takes K Dur tid  . Hypothyroidism    takes Synthroid daily  . Insomnia    takes restoril prn  . Joint pain   . Joint swelling   . Peripheral edema    takes HCTZ daily  . Personal history of radiation therapy 2014   F/U lumpectomy   . Presence of pessary     Past Surgical History:  Procedure Laterality Date  . ABDOMINAL HYSTERECTOMY    . BACK SURGERY  2009  . BREAST BIOPSY Left 10/22/2012   Stereo - DCIS  . BREAST LUMPECTOMY Left 2014   DCIS. F/U radiation   . CARDIAC CATHETERIZATION  1999  . CATARACT EXTRACTION W/PHACO Right 10/05/2015   Procedure: CATARACT EXTRACTION PHACO AND INTRAOCULAR LENS PLACEMENT (IOC);  Surgeon: Ronnell Freshwater, MD;   Location: [place];  Service: Ophthalmology;  Laterality: Right;  RIGHT  . CATARACT EXTRACTION W/PHACO Left 11/16/2015   Procedure: CATARACT EXTRACTION PHACO AND INTRAOCULAR LENS PLACEMENT (IOC);  Surgeon: Ronnell Freshwater, MD;  Location: [place];  Service: Ophthalmology;  Laterality: Left;  LEFT  . COLONOSCOPY    . CORONARY ANGIOPLASTY     1 stent  . partial coloectomy  1995  . right knee arthroscopy  2013  . TONSILLECTOMY    . TOTAL KNEE ARTHROPLASTY  01/02/2012   RIGHT KNEE  . TOTAL KNEE ARTHROPLASTY  01/02/2012   Procedure: TOTAL KNEE ARTHROPLASTY;  Surgeon: Vickey Huger, MD;  Location: [place];  Service: Orthopedics;  Laterality: Right;  . TOTAL KNEE ARTHROPLASTY Left 10/09/2017   Procedure: LEFT TOTAL KNEE ARTHROPLASTY;  Surgeon: Vickey Huger, MD;  Location: [facility];  Service: Orthopedics;  Laterality: Left;  with block    There were no vitals filed for this visit.  Subjective Assessment - 11/13/17 1613    Subjective  Patient reports she is not in any pain and has not had to use ice the last couple of days. States she has done her  HEP some.     Patient is accompained by:  Family member    Pertinent History  patient had surgery to left knee 10/09/17 and stayed in 1 night and then was DC home. She was ambulating with a SPC prior to surgery. She was walking at home with rollator in the house and outside of the house for 2 years. She has 1 step to get into her house. She had surgery on her back 10 years ago.     Limitations  Standing;Walking    Patient Stated Goals  to be able to walk without pain .     Currently in Pain?  No/denies    Pain Score  0-No pain    Pain Onset  More than a month ago    Multiple Pain Sites  No        Supine: L knee  AROM: .  Flexion:105 Extension: -6  Nustep x 5 minutes to promote knee mobility   Ankle pumps 2x10   Heel slides 2x10 to improve ROM. Verbal cueing to bend knee as much as tolerated   Quad sets 3x10 with 3  second holds. Verbal and tactile cueing to squeeze quadriceps and promote activation  SAQ 2x10 3 second holds verbal cuing to contract quad.   SLR 2x10 verbal cueing to keep knee straight.  LAQ seated 2x10 with 3 second holds. Verbal cues to contract quadriceps  Standing with LLE on second step with knee flex and 10 sec hold x 10 reps  Standing with LLE on second step and stretching into extension x10 sec holds x 10 reps                        PT Education - 11/13/17 1613    Education Details  HEP    Person(s) Educated  Patient    Methods  Explanation;Demonstration    Comprehension  Returned demonstration;Need further instruction       PT Short Term Goals - 10/23/17 1515      PT SHORT TERM GOAL #1   Title  Patient will be independent in home exercise program to improve strength/mobility for better functional independence with ADLs.    Time  4    Period  Weeks    Status  New    Target Date  11/20/17      PT SHORT TERM GOAL #2   Title  Patient (< 72 years old) will complete five times sit to stand test in < 10 seconds indicating an increased LE strength and improved balance.    Time  4    Period  Weeks    Status  New    Target Date  11/20/17        PT Long Term Goals - 10/23/17 1518      PT LONG TERM GOAL #1   Title  Patient will increase 10 meter walk test to >1.51m/s as to improve gait speed for better community ambulation and to reduce fall risk.    Time  8    Period  Weeks    Status  New    Target Date  12/18/17      PT LONG TERM GOAL #2   Title  Patient will reduce timed up and go to <11 seconds to reduce fall risk and demonstrate improved transfer/gait ability.    Time  8    Period  Weeks    Status  New    Target Date  12/18/17  PT LONG TERM GOAL #3   Title  Patient will report a worst pain of 3/10 on VAS in  left knee           to improve tolerance with ADLs and reduced symptoms with activities.     Baseline  8/10 left knee     Time  8    Period  Weeks    Status  New    Target Date  12/18/17      PT LONG TERM GOAL #4   Title  Patient will increase BLE gross strength to 4+/5 as to improve functional strength for independent gait, increased standing tolerance and increased ADL ability.    Time  8    Period  Weeks    Status  New    Target Date  12/18/17            Plan - 11/13/17 1614    Clinical Impression Statement  Patient performs AROM, PROM and therapeutic exericses to LLE knee with improvements in ROM -6 degs ext to 105 deg flex left knee. She reports no pain and has improved  Gait speed.  She was instructed in HEP and will continue to benefit from skilled PT to improve strength and mobiity.    Rehab Potential  Good    PT Frequency  2x / week    PT Duration  8 weeks    PT Treatment/Interventions  Gait training;Therapeutic exercise;Therapeutic activities;Functional mobility training;Stair training;Balance training;Patient/family education;Moist Heat;Cryotherapy;Neuromuscular re-education;Passive range of motion;Manual techniques;Scar mobilization    PT Next Visit Plan  sit to stands, balance, strength, mobility    Consulted and Agree with Plan of Care  Patient       Patient will benefit from skilled therapeutic intervention in order to improve the following deficits and impairments:  Abnormal gait, Decreased balance, Decreased endurance, Decreased mobility, Difficulty walking, Decreased activity tolerance, Decreased safety awareness, Decreased strength, Impaired flexibility, Pain  Visit Diagnosis: Muscle weakness (generalized)     Problem List Patient Active Problem List   Diagnosis Date Noted  . 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 prolapse 10/24/2017  . Depression, recurrent ([place]) 10/24/2017  . 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 Dysuria 10/21/2013  . Nasal lesion 07/14/2013  . Fatigue 04/02/2013  . Breast cancer ([place]) 12/02/2012  . CAD (coronary artery disease) 09/15/2012  . Essential hypertension,  benign 09/15/2012  . Hypercholesterolemia 09/15/2012  . Hypothyroidism 09/15/2012  . GERD (gastroesophageal reflux disease) 09/15/2012  . Diverticulosis 09/15/2012  . Chronic back pain 09/15/2012  . Hypokalemia 09/15/2012    Alanson Puls, PT DPT 11/13/2017, 4:25 PM  [place] MAIN [facility] SERVICES [address] [place], [place], [address] Phone: [phone]   Fax:  [phone]  Name: KYLEI PURINGTON MRN: [id number] Date of Birth: 05-06-1940

## 2017-11-15 ENCOUNTER — Ambulatory Visit: Payer: Medicare Other | Attending: Orthopedic Surgery

## 2017-11-15 DIAGNOSIS — M6281 Muscle weakness (generalized): Secondary | ICD-10-CM | POA: Insufficient documentation

## 2017-11-15 DIAGNOSIS — R262 Difficulty in walking, not elsewhere classified: Secondary | ICD-10-CM | POA: Insufficient documentation

## 2017-11-16 ENCOUNTER — Encounter: Payer: Medicare Other | Admitting: Physical Therapy

## 2017-11-20 ENCOUNTER — Encounter: Payer: Medicare Other | Admitting: Physical Therapy

## 2017-11-20 ENCOUNTER — Ambulatory Visit: Payer: Medicare Other | Admitting: Physical Therapy

## 2017-11-20 ENCOUNTER — Encounter: Payer: Self-pay | Admitting: Physical Therapy

## 2017-11-20 DIAGNOSIS — M6281 Muscle weakness (generalized): Secondary | ICD-10-CM

## 2017-11-20 DIAGNOSIS — R262 Difficulty in walking, not elsewhere classified: Secondary | ICD-10-CM | POA: Diagnosis present

## 2017-11-20 NOTE — Therapy (Signed)
[place] MAIN [facility] SERVICES [address] [place], [place], [address] Phone: [phone]   Fax:  [phone]  Physical Therapy Treatment  Patient Details  Name: Laurie Hale MRN: [id number] Date of Birth: Oct 09, 1940 Referring Provider (PT): Vickey Huger, MD    Encounter Date: 11/20/2017  PT End of Session - 11/20/17 1526    Visit Number  6    Number of Visits  17    Date for PT Re-Evaluation  12/11/17    Authorization Type  6/10    PT Start Time  0315    PT Stop Time  0400    PT Time Calculation (min)  45 min    Equipment Utilized During Treatment  Gait belt    Activity Tolerance  Patient tolerated treatment well    Behavior During Therapy  [facility] for tasks assessed/performed       Past Medical History:  Diagnosis Date  . Arthritis   . 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 Cataract    immature bilateral  . Chronic back pain   . Constipation   . Coronary artery disease   . Diverticulosis   . GERD (gastroesophageal reflux disease)    takes a med daily-to bring with med name at surgery  . History of migraine    hasn't had one in yrs-per pt Zoloft stopped them  . Hyperlipidemia    takes Crestor and Niacin daily  . Hypertension    takes Amlodipine  and Lisinopril daily  . Hypokalemia    takes K Dur tid  . Hypothyroidism    takes Synthroid daily  . Insomnia    takes restoril prn  . Joint pain   . Joint swelling   . Peripheral edema    takes HCTZ daily  . Personal history of radiation therapy 2014   F/U lumpectomy   . Presence of pessary     Past Surgical History:  Procedure Laterality Date  . ABDOMINAL HYSTERECTOMY    . BACK SURGERY  2009  . BREAST BIOPSY Left 10/22/2012   Stereo - DCIS  . BREAST LUMPECTOMY Left 2014   DCIS. F/U radiation   . CARDIAC CATHETERIZATION  1999  . CATARACT EXTRACTION W/PHACO Right 10/05/2015   Procedure: CATARACT EXTRACTION PHACO AND INTRAOCULAR LENS PLACEMENT (IOC);  Surgeon:  Ronnell Freshwater, MD;  Location: [place];  Service: Ophthalmology;  Laterality: Right;  RIGHT  . CATARACT EXTRACTION W/PHACO Left 11/16/2015   Procedure: CATARACT EXTRACTION PHACO AND INTRAOCULAR LENS PLACEMENT (IOC);  Surgeon: Ronnell Freshwater, MD;  Location: [facility];  Service: Ophthalmology;  Laterality: Left;  LEFT  . COLONOSCOPY    . CORONARY ANGIOPLASTY     1 stent  . partial coloectomy  1995  . right knee arthroscopy  2013  . TONSILLECTOMY    . TOTAL KNEE ARTHROPLASTY  01/02/2012   RIGHT KNEE  . TOTAL KNEE ARTHROPLASTY  01/02/2012   Procedure: TOTAL KNEE ARTHROPLASTY;  Surgeon: Vickey Huger, MD;  Location: [place];  Service: Orthopedics;  Laterality: Right;  . TOTAL KNEE ARTHROPLASTY Left 10/09/2017   Procedure: LEFT TOTAL KNEE ARTHROPLASTY;  Surgeon: Vickey Huger, MD;  [facility];  Service: Orthopedics;  Laterality: Left;  with block    There were no vitals filed for this visit.  Subjective Assessment - 11/20/17 1525    Subjective  Patient reports she is not in any pain and has not had to use ice the last couple  of days. States she has done her HEP some.     Patient is accompained by:  Family member    Pertinent History  patient had surgery to left knee 10/09/17 and stayed in 1 night and then was DC home. She was ambulating with a SPC prior to surgery. She was walking at home with rollator in the house and outside of the house for 2 years. She has 1 step to get into her house. She had surgery on her back 10 years ago.     Limitations  Standing;Walking    Patient Stated Goals  to be able to walk without pain .     Currently in Pain?  No/denies    Pain Score  0-No pain    Pain Onset  More than a month ago    Multiple Pain Sites  No       Supine: L knee  AROM: .  Flexion: 106 deg  Extension: -6 deg  Nustep x 5 minutes to promote knee mobility   Ankle pumps 2x10   Heel slides 2x10 to improve ROM. Verbal cueing to bend knee as much  as tolerated   Quad sets 3x10 with 3 second holds. Verbal and tactile cueing to squeeze quadriceps and promote activation  SAQ 2x103 second holdsverbal cuing to contract quad.  SLR 2x10 verbal cueing to keep knee straight.  LAQ seated 2x10 with 3 second holds. Verbal cues to contract quadriceps  Standing with LLE on second step with knee flex and 10 sec hold x 10 reps  Standing with LLE on second step and stretching into extensionx10secholdsx10reps  standing knee flex x 10 x 2 BLE  Standing hip extension x 10 x 2 BLE  Heel raises x 10 x 2 BLE   Mini Squats x 10   Lunges to bosu ball x 10   Patient needs cues for posture and technique.                     PT Education - 11/20/17 1525    Education Details  HEP    Person(s) Educated  Patient    Methods  Explanation;Demonstration;Verbal cues    Comprehension  Returned demonstration;Verbalized understanding;Need further instruction       PT Short Term Goals - 10/23/17 1515      PT SHORT TERM GOAL #1   Title  Patient will be independent in home exercise program to improve strength/mobility for better functional independence with ADLs.    Time  4    Period  Weeks    Status  New    Target Date  11/20/17      PT SHORT TERM GOAL #2   Title  Patient (< 101 years old) will complete five times sit to stand test in < 10 seconds indicating an increased LE strength and improved balance.    Time  4    Period  Weeks    Status  New    Target Date  11/20/17        PT Long Term Goals - 10/23/17 1518      PT LONG TERM GOAL #1   Title  Patient will increase 10 meter walk test to >1.85m/s as to improve gait speed for better community ambulation and to reduce fall risk.    Time  8    Period  Weeks    Status  New    Target Date  12/18/17      PT LONG TERM GOAL #2  Title  Patient will reduce timed up and go to <11 seconds to reduce fall risk and demonstrate improved transfer/gait ability.    Time   8    Period  Weeks    Status  New    Target Date  12/18/17      PT LONG TERM GOAL #3   Title  Patient will report a worst pain of 3/10 on VAS in  left knee           to improve tolerance with ADLs and reduced symptoms with activities.     Baseline  8/10 left knee    Time  8    Period  Weeks    Status  New    Target Date  12/18/17      PT LONG TERM GOAL #4   Title  Patient will increase BLE gross strength to 4+/5 as to improve functional strength for independent gait, increased standing tolerance and increased ADL ability.    Time  8    Period  Weeks    Status  New    Target Date  12/18/17            Plan - 11/20/17 1526    Clinical Impression Statement  Patient performs AROM, PROM and therapeutic exericses to LLE knee with improvements in ROM -5 degs ext to 97 deg flex left knee. She reports 6/10 pain and has slow antalgic gait. She was instructed in HEP and will continue to benefit from skilled PT to improve strength and mobiity    Rehab Potential  Good    PT Frequency  2x / week    PT Duration  8 weeks    PT Treatment/Interventions  Gait training;Therapeutic exercise;Therapeutic activities;Functional mobility training;Stair training;Balance training;Patient/family education;Moist Heat;Cryotherapy;Neuromuscular re-education;Passive range of motion;Manual techniques;Scar mobilization    PT Next Visit Plan  sit to stands, balance, strength, mobility    Consulted and Agree with Plan of Care  Patient       Patient will benefit from skilled therapeutic intervention in order to improve the following deficits and impairments:  Abnormal gait, Decreased balance, Decreased endurance, Decreased mobility, Difficulty walking, Decreased activity tolerance, Decreased safety awareness, Decreased strength, Impaired flexibility, Pain  Visit Diagnosis: Muscle weakness (generalized)  Difficulty walking     Problem List Patient Active Problem List   Diagnosis Date Noted  . Female  genital prolapse 10/24/2017  . Depression, recurrent ([place]) 10/24/2017  . 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 Dysuria 10/21/2013  . Nasal lesion 07/14/2013  . Fatigue 04/02/2013  . Breast cancer ([place]) 12/02/2012  . CAD (coronary artery disease) 09/15/2012  . Essential hypertension, benign 09/15/2012  . Hypercholesterolemia 09/15/2012  . Hypothyroidism 09/15/2012  . GERD (gastroesophageal reflux disease) 09/15/2012  . Diverticulosis 09/15/2012  . Chronic back pain 09/15/2012  . Hypokalemia 09/15/2012    Arelia Sneddon S,PT DPT 11/20/2017, 3:27 PM  [place] MAIN [facility] SERVICES [address] [place], [place], [address] Phone: [phone]   Fax:  [phone]  Name: Laurie Hale MRN: [id number] Date of Birth: [birth year]

## 2017-11-22 ENCOUNTER — Ambulatory Visit: Payer: Medicare Other | Admitting: Physical Therapy

## 2017-11-23 ENCOUNTER — Encounter: Payer: Medicare Other | Admitting: Physical Therapy

## 2017-11-27 ENCOUNTER — Encounter: Payer: Self-pay | Admitting: Physical Therapy

## 2017-11-27 ENCOUNTER — Ambulatory Visit: Payer: Medicare Other | Admitting: Physical Therapy

## 2017-11-27 ENCOUNTER — Encounter: Payer: Medicare Other | Admitting: Physical Therapy

## 2017-11-27 DIAGNOSIS — M6281 Muscle weakness (generalized): Secondary | ICD-10-CM | POA: Diagnosis not present

## 2017-11-27 DIAGNOSIS — R262 Difficulty in walking, not elsewhere classified: Secondary | ICD-10-CM

## 2017-11-27 NOTE — Therapy (Signed)
[place] MAIN [facility] SERVICES [address] [place], [place], [address] Phone: [phone]   Fax:  [phone]  Physical Therapy Treatment  Patient Details  Name: Laurie Hale MRN: [id number] Date of Birth: 1940/08/17 Referring Provider (PT): Vickey Huger, MD    Encounter Date: 11/27/2017  PT End of Session - 11/27/17 1211    Visit Number  7    Number of Visits  17    Date for PT Re-Evaluation  12/11/17    Authorization Type  6/10    PT Start Time  1150    PT Stop Time  1230    PT Time Calculation (min)  40 min    Equipment Utilized During Treatment  Gait belt    Activity Tolerance  Patient tolerated treatment well    Behavior During Therapy  [facility] for tasks assessed/performed       Past Medical History:  Diagnosis Date  . Arthritis   . 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 Cataract    immature bilateral  . Chronic back pain   . Constipation   . Coronary artery disease   . Diverticulosis   . GERD (gastroesophageal reflux disease)    takes a med daily-to bring with med name at surgery  . History of migraine    hasn't had one in yrs-per pt Zoloft stopped them  . Hyperlipidemia    takes Crestor and Niacin daily  . Hypertension    takes Amlodipine  and Lisinopril daily  . Hypokalemia    takes K Dur tid  . Hypothyroidism    takes Synthroid daily  . Insomnia    takes restoril prn  . Joint pain   . Joint swelling   . Peripheral edema    takes HCTZ daily  . Personal history of radiation therapy 2014   F/U lumpectomy   . Presence of pessary     Past Surgical History:  Procedure Laterality Date  . ABDOMINAL HYSTERECTOMY    . BACK SURGERY  2009  . BREAST BIOPSY Left 10/22/2012   Stereo - DCIS  . BREAST LUMPECTOMY Left 2014   DCIS. F/U radiation   . CARDIAC CATHETERIZATION  1999  . CATARACT EXTRACTION W/PHACO Right 10/05/2015   Procedure: CATARACT EXTRACTION PHACO AND INTRAOCULAR LENS PLACEMENT (IOC);  Surgeon:  Ronnell Freshwater, MD;  Location: [place];  Service: Ophthalmology;  Laterality: Right;  RIGHT  . CATARACT EXTRACTION W/PHACO Left 11/16/2015   Procedure: CATARACT EXTRACTION PHACO AND INTRAOCULAR LENS PLACEMENT (IOC);  Surgeon: Ronnell Freshwater, MD;  Location: [place];  Service: Ophthalmology;  Laterality: Left;  LEFT  . COLONOSCOPY    . CORONARY ANGIOPLASTY     1 stent  . partial coloectomy  1995  . right knee arthroscopy  2013  . TONSILLECTOMY    . TOTAL KNEE ARTHROPLASTY  01/02/2012   RIGHT KNEE  . TOTAL KNEE ARTHROPLASTY  01/02/2012   Procedure: TOTAL KNEE ARTHROPLASTY;  Surgeon: Vickey Huger, MD;  Location: [place];  Service: Orthopedics;  Laterality: Right;  . TOTAL KNEE ARTHROPLASTY Left 10/09/2017   Procedure: LEFT TOTAL KNEE ARTHROPLASTY;  Surgeon: Vickey Huger, MD;  [facility];  Service: Orthopedics;  Laterality: Left;  with block    There were no vitals filed for this visit.  Subjective Assessment - 11/27/17 1208    Subjective  Patient reports she is having back pain 4-5/10 and is increasis to 10/10 during the day  Patient is accompained by:  Family member    Pertinent History  patient had surgery to left knee 10/09/17 and stayed in 1 night and then was DC home. She was ambulating with a SPC prior to surgery. She was walking at home with rollator in the house and outside of the house for 2 years. She has 1 step to get into her house. She had surgery on her back 10 years ago.     Limitations  Standing;Walking    Patient Stated Goals  to be able to walk without pain .     Currently in Pain?  Yes    Pain Score  4     Pain Location  Back    Pain Orientation  Lower    Pain Descriptors / Indicators  Aching    Pain Onset  More than a month ago    Pain Frequency  Constant    Aggravating Factors   walking    Pain Relieving Factors  sitting    Effect of Pain on Daily Activities  not able to do things in the house    Multiple Pain Sites  No        Nustepx 38minutes to promote knee mobility   Ankle pumps 2x10   Heel slides 2x10 to improve ROM. Verbal cueing to bend knee as much as tolerated   Quad sets 3x10 with 3 second holds. Verbal and tactile cueing to squeeze quadriceps and promote activation  SAQ 2x103 second holdsverbal cuing to contract quad.  SLR 2x10 verbal cueing to keep knee straight.  sidelying hip abd with cues for correct positioning   Bridging x 10 with very little raising off of the mat  Single knee to chest stretch x 30 sec x 3  Seated hamstring stretch x 30 sec x 3 sets  LAQ seated 2x10 with 3 second holds. Verbal cues to contract quadriceps  Standing with LLE on second step with knee flex and 10 sec hold x 10 reps  Standing with LLE on second step and stretching into extensionx10secholdsx10reps standing knee flex x 10 x 2 BLE  Standing hip extension x 10 x 2 BLE  Mini Squats x 10   Lunges to bosu ball x 10   Patient needs cues for posture and technique. She has cramps on and off during treatment and has slow guarded movements due to her back pain.                         PT Education - 11/27/17 1210    Education Details  HEP, stretching    Person(s) Educated  Patient    Methods  Explanation;Tactile cues;Verbal cues;Demonstration    Comprehension  Verbalized understanding;Returned demonstration;Need further instruction       PT Short Term Goals - 10/23/17 1515      PT SHORT TERM GOAL #1   Title  Patient will be independent in home exercise program to improve strength/mobility for better functional independence with ADLs.    Time  4    Period  Weeks    Status  New    Target Date  11/20/17      PT SHORT TERM GOAL #2   Title  Patient (< 63 years old) will complete five times sit to stand test in < 10 seconds indicating an increased LE strength and improved balance.    Time  4    Period  Weeks    Status  New  Target Date  11/20/17         PT Long Term Goals - 10/23/17 1518      PT LONG TERM GOAL #1   Title  Patient will increase 10 meter walk test to >1.61m/s as to improve gait speed for better community ambulation and to reduce fall risk.    Time  8    Period  Weeks    Status  New    Target Date  12/18/17      PT LONG TERM GOAL #2   Title  Patient will reduce timed up and go to <11 seconds to reduce fall risk and demonstrate improved transfer/gait ability.    Time  8    Period  Weeks    Status  New    Target Date  12/18/17      PT LONG TERM GOAL #3   Title  Patient will report a worst pain of 3/10 on VAS in  left knee           to improve tolerance with ADLs and reduced symptoms with activities.     Baseline  8/10 left knee    Time  8    Period  Weeks    Status  New    Target Date  12/18/17      PT LONG TERM GOAL #4   Title  Patient will increase BLE gross strength to 4+/5 as to improve functional strength for independent gait, increased standing tolerance and increased ADL ability.    Time  8    Period  Weeks    Status  New    Target Date  12/18/17            Plan - 11/27/17 1214    Clinical Impression Statement  Patinet has 4/10 back pain and no left knee pain. She has improved ROM to left knee 0 deg ext to 105 flex. She perfoms open chain and closed chain exercises for strengthening and  perofrms stretching to improve flexibility. she will continue to benefit from skilled PT to improve quality of life and mobility    Rehab Potential  Good    PT Frequency  2x / week    PT Duration  8 weeks    PT Treatment/Interventions  Gait training;Therapeutic exercise;Therapeutic activities;Functional mobility training;Stair training;Balance training;Patient/family education;Moist Heat;Cryotherapy;Neuromuscular re-education;Passive range of motion;Manual techniques;Scar mobilization    PT Next Visit Plan  sit to stands, balance, strength, mobility    Consulted and Agree with Plan of Care  Patient        Patient will benefit from skilled therapeutic intervention in order to improve the following deficits and impairments:  Abnormal gait, Decreased balance, Decreased endurance, Decreased mobility, Difficulty walking, Decreased activity tolerance, Decreased safety awareness, Decreased strength, Impaired flexibility, Pain  Visit Diagnosis: Muscle weakness (generalized)  Difficulty walking     Problem List Patient Active Problem List   Diagnosis Date Noted  . 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 prolapse 10/24/2017  . Depression, recurrent ([place]) 10/24/2017  . 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 Dysuria 10/21/2013  . Nasal lesion 07/14/2013  . Fatigue 04/02/2013  . Breast cancer ([place]) 12/02/2012  . CAD (coronary artery disease) 09/15/2012  . Essential hypertension, benign 09/15/2012  . Hypercholesterolemia 09/15/2012  . Hypothyroidism 09/15/2012  . GERD (gastroesophageal reflux disease) 09/15/2012  . Diverticulosis 09/15/2012  . Chronic back pain 09/15/2012  . Hypokalemia 09/15/2012    Alanson Puls, PT DPT 11/27/2017, 12:17 PM  [place] MAIN  [place], [place], [address] Phone: [phone]   Fax:  [phone]  Name: Laurie Hale MRN: [id number] Date of Birth: 07-05-1940

## 2017-11-29 ENCOUNTER — Ambulatory Visit: Payer: Medicare Other | Admitting: Physical Therapy

## 2017-11-30 ENCOUNTER — Encounter: Payer: Medicare Other | Admitting: Physical Therapy

## 2017-12-04 ENCOUNTER — Encounter: Payer: Self-pay | Admitting: Physical Therapy

## 2017-12-04 ENCOUNTER — Encounter: Payer: Medicare Other | Admitting: Physical Therapy

## 2017-12-04 ENCOUNTER — Ambulatory Visit: Payer: Medicare Other | Admitting: Physical Therapy

## 2017-12-04 DIAGNOSIS — M6281 Muscle weakness (generalized): Secondary | ICD-10-CM | POA: Diagnosis not present

## 2017-12-04 DIAGNOSIS — R262 Difficulty in walking, not elsewhere classified: Secondary | ICD-10-CM

## 2017-12-04 NOTE — Therapy (Signed)
[place] MAIN [facility] SERVICES [address] [place], [place], [address] Phone: [phone]   Fax:  [phone]  Physical Therapy Treatment  Patient Details  Name: Laurie Hale MRN: [id number] Date of Birth: 1940-12-01 Referring Provider (PT): Vickey Huger, MD    Encounter Date: 12/04/2017  PT End of Session - 12/04/17 1457    Visit Number  8    Number of Visits  17    Date for PT Re-Evaluation  12/11/17    Authorization Type  7/10    PT Start Time  0243    PT Stop Time  0321    PT Time Calculation (min)  38 min    Equipment Utilized During Treatment  Gait belt    Activity Tolerance  Patient tolerated treatment well    Behavior During Therapy  [facility] for tasks assessed/performed       Past Medical History:  Diagnosis Date  . Arthritis   . 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 Cataract    immature bilateral  . Chronic back pain   . Constipation   . Coronary artery disease   . Diverticulosis   . GERD (gastroesophageal reflux disease)    takes a med daily-to bring with med name at surgery  . History of migraine    hasn't had one in yrs-per pt Zoloft stopped them  . Hyperlipidemia    takes Crestor and Niacin daily  . Hypertension    takes Amlodipine  and Lisinopril daily  . Hypokalemia    takes K Dur tid  . Hypothyroidism    takes Synthroid daily  . Insomnia    takes restoril prn  . Joint pain   . Joint swelling   . Peripheral edema    takes HCTZ daily  . Personal history of radiation therapy 2014   F/U lumpectomy   . Presence of pessary     Past Surgical History:  Procedure Laterality Date  . ABDOMINAL HYSTERECTOMY    . BACK SURGERY  2009  . BREAST BIOPSY Left 10/22/2012   Stereo - DCIS  . BREAST LUMPECTOMY Left 2014   DCIS. F/U radiation   . CARDIAC CATHETERIZATION  1999  . CATARACT EXTRACTION W/PHACO Right 10/05/2015   Procedure: CATARACT EXTRACTION PHACO AND INTRAOCULAR LENS PLACEMENT (IOC);  Surgeon:  Ronnell Freshwater, MD;  Location: [place];  Service: Ophthalmology;  Laterality: Right;  RIGHT  . CATARACT EXTRACTION W/PHACO Left 11/16/2015   Procedure: CATARACT EXTRACTION PHACO AND INTRAOCULAR LENS PLACEMENT (IOC);  Surgeon: Ronnell Freshwater, MD;  Location: [place];  Service: Ophthalmology;  Laterality: Left;  LEFT  . COLONOSCOPY    . CORONARY ANGIOPLASTY     1 stent  . partial coloectomy  1995  . right knee arthroscopy  2013  . TONSILLECTOMY    . TOTAL KNEE ARTHROPLASTY  01/02/2012   RIGHT KNEE  . TOTAL KNEE ARTHROPLASTY  01/02/2012   Procedure: TOTAL KNEE ARTHROPLASTY;  Surgeon: Vickey Huger, MD;  Location: [place];  Service: Orthopedics;  Laterality: Right;  . TOTAL KNEE ARTHROPLASTY Left 10/09/2017   Procedure: LEFT TOTAL KNEE ARTHROPLASTY;  Surgeon: Vickey Huger, MD;  Location: [facility];  Service: Orthopedics;  Laterality: Left;  with block    There were no vitals filed for this visit.  Subjective Assessment - 12/04/17 1456    Subjective  Patient reports she is having back pain 5/10 . She says that she needs a back brace.  Patient is accompained by:  Family member    Pertinent History  patient had surgery to left knee 10/09/17 and stayed in 1 night and then was DC home. She was ambulating with a SPC prior to surgery. She was walking at home with rollator in the house and outside of the house for 2 years. She has 1 step to get into her house. She had surgery on her back 10 years ago.     Limitations  Standing;Walking    Patient Stated Goals  to be able to walk without pain .     Currently in Pain?  Yes    Pain Score  5     Pain Location  Back    Pain Orientation  Lower    Pain Descriptors / Indicators  Aching    Pain Type  Acute pain    Pain Onset  More than a month ago     Supine: L knee  AROM: .  Flexion:105 Extension: -2  Nustep x 5 minutes to promote knee mobility   Thomas test + R hip, stretching 30 sec x 3 and roller stick to  right quad to reduce pain  Ankle pumps 2x10   Heel slides 2x10 to improve ROM. Verbal cueing to bend knee as much as tolerated   Quad sets 3x10 with 3 second holds. Verbal and tactile cueing to squeeze quadriceps and promote activation  SAQ 2x103 second holdsverbal cuing to contract quad.  SLR 2x10 verbal cueing to keep knee straight.  LAQ seated 2x10 with 3 second holds. Verbal cues to contract quadriceps  Standing with LLE on second step with knee flex and 10 sec hold x 10 reps  Standing with LLE on second step and stretching into extensionx10secholdsx10reps                       PT Education - 12/04/17 1457    Education Details  HEP    Person(s) Educated  Patient    Methods  Explanation    Comprehension  Returned demonstration       PT Short Term Goals - 10/23/17 1515      PT SHORT TERM GOAL #1   Title  Patient will be independent in home exercise program to improve strength/mobility for better functional independence with ADLs.    Time  4    Period  Weeks    Status  New    Target Date  11/20/17      PT SHORT TERM GOAL #2   Title  Patient (77 years old) will complete five times sit to stand test in < 10 seconds indicating an increased LE strength and improved balance.    Time  4    Period  Weeks    Status  New    Target Date  11/20/17        PT Long Term Goals - 10/23/17 1518      PT LONG TERM GOAL #1   Title  Patient will increase 10 meter walk test to >1.81m/s as to improve gait speed for better community ambulation and to reduce fall risk.    Time  8    Period  Weeks    Status  New    Target Date  12/18/17      PT LONG TERM GOAL #2   Title  Patient will reduce timed up and go to <11 seconds to reduce fall risk and demonstrate improved transfer/gait ability.    Time  8    Period  Weeks    Status  New    Target Date  12/18/17      PT LONG TERM GOAL #3   Title  Patient will report a worst pain of 3/10 on VAS in   left knee           to improve tolerance with ADLs and reduced symptoms with activities.     Baseline  8/10 left knee    Time  8    Period  Weeks    Status  New    Target Date  12/18/17      PT LONG TERM GOAL #4   Title  Patient will increase BLE gross strength to 4+/5 as to improve functional strength for independent gait, increased standing tolerance and increased ADL ability.    Time  8    Period  Weeks    Status  New    Target Date  12/18/17            Plan - 12/04/17 1501    Clinical Impression Statement  Patient continues to report back pain that is limiting her ambulation due to pain. She was instructed in HEP with minimal cues . she has improved ROM and strength in left knee but has increased pain in low back due to not having a back brace for support. She performed  BLE in supine and sidelying to improve strength and ROM deficits to improvve her quality of ambulation. She will continue to benefit from skilled PT to improve mobility     Rehab Potential  Good    PT Frequency  2x / week    PT Duration  8 weeks    PT Treatment/Interventions  Gait training;Therapeutic exercise;Therapeutic activities;Functional mobility training;Stair training;Balance training;Patient/family education;Moist Heat;Cryotherapy;Neuromuscular re-education;Passive range of motion;Manual techniques;Scar mobilization    PT Next Visit Plan  sit to stands, balance, strength, mobility    Consulted and Agree with Plan of Care  Patient       Patient will benefit from skilled therapeutic intervention in order to improve the following deficits and impairments:  Abnormal gait, Decreased balance, Decreased endurance, Decreased mobility, Difficulty walking, Decreased activity tolerance, Decreased safety awareness, Decreased strength, Impaired flexibility, Pain  Visit Diagnosis: Muscle weakness (generalized)  Difficulty walking     Problem List Patient Active Problem List   Diagnosis Date Noted  . Female  genital prolapse 10/24/2017  . Depression, recurrent ([place]) 10/24/2017  . 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 Dysuria 10/21/2013  . Nasal lesion 07/14/2013  . Fatigue 04/02/2013  . Breast cancer ([place]) 12/02/2012  . CAD (coronary artery disease) 09/15/2012  . Essential hypertension, benign 09/15/2012  . Hypercholesterolemia 09/15/2012  . Hypothyroidism 09/15/2012  . GERD (gastroesophageal reflux disease) 09/15/2012  . Diverticulosis 09/15/2012  . Chronic back pain 09/15/2012  . Hypokalemia 09/15/2012    Alanson Puls, PT DPT 12/04/2017, 3:05 PM  [place] MAIN [facility] SERVICES [address] [place], [place], [address] Phone: [phone]   Fax:  [phone]  Name: RAYOLA EVERHART MRN: [id number] Date of Birth: January 15, 1941

## 2017-12-06 ENCOUNTER — Ambulatory Visit: Payer: Medicare Other | Admitting: Physical Therapy

## 2017-12-06 ENCOUNTER — Encounter: Payer: Self-pay | Admitting: Physical Therapy

## 2017-12-06 DIAGNOSIS — R262 Difficulty in walking, not elsewhere classified: Secondary | ICD-10-CM

## 2017-12-06 DIAGNOSIS — M6281 Muscle weakness (generalized): Secondary | ICD-10-CM | POA: Diagnosis not present

## 2017-12-06 NOTE — Therapy (Signed)
[place] MAIN [facility] SERVICES [address] [place], [place], [address] Phone: [phone]   Fax:  [phone]  Physical Therapy Treatment  Patient Details  Name: Laurie Hale MRN: [id number] Date of Birth: 05/07/1940 Referring Provider (PT): Vickey Huger, MD    Encounter Date: 12/06/2017  PT End of Session - 12/06/17 1605    Visit Number  9    Number of Visits  17    Date for PT Re-Evaluation  12/11/17    Authorization Type  9/10    PT Start Time  0315    PT Stop Time  0400    PT Time Calculation (min)  45 min    Equipment Utilized During Treatment  Gait belt    Activity Tolerance  Patient tolerated treatment well    Behavior During Therapy  [facility] for tasks assessed/performed       Past Medical History:  Diagnosis Date  . Arthritis   . 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 Cataract    immature bilateral  . Chronic back pain   . Constipation   . Coronary artery disease   . Diverticulosis   . GERD (gastroesophageal reflux disease)    takes a med daily-to bring with med name at surgery  . History of migraine    hasn't had one in yrs-per pt Zoloft stopped them  . Hyperlipidemia    takes Crestor and Niacin daily  . Hypertension    takes Amlodipine  and Lisinopril daily  . Hypokalemia    takes K Dur tid  . Hypothyroidism    takes Synthroid daily  . Insomnia    takes restoril prn  . Joint pain   . Joint swelling   . Peripheral edema    takes HCTZ daily  . Personal history of radiation therapy 2014   F/U lumpectomy   . Presence of pessary     Past Surgical History:  Procedure Laterality Date  . ABDOMINAL HYSTERECTOMY    . BACK SURGERY  2009  . BREAST BIOPSY Left 10/22/2012   Stereo - DCIS  . BREAST LUMPECTOMY Left 2014   DCIS. F/U radiation   . CARDIAC CATHETERIZATION  1999  . CATARACT EXTRACTION W/PHACO Right 10/05/2015   Procedure: CATARACT EXTRACTION PHACO AND INTRAOCULAR LENS PLACEMENT (IOC);  Surgeon:  Ronnell Freshwater, MD;  Location: [place];  Service: Ophthalmology;  Laterality: Right;  RIGHT  . CATARACT EXTRACTION W/PHACO Left 11/16/2015   Procedure: CATARACT EXTRACTION PHACO AND INTRAOCULAR LENS PLACEMENT (IOC);  Surgeon: Ronnell Freshwater, MD;  Location: [place];  Service: Ophthalmology;  Laterality: Left;  LEFT  . COLONOSCOPY    . CORONARY ANGIOPLASTY     1 stent  . partial coloectomy  1995  . right knee arthroscopy  2013  . TONSILLECTOMY    . TOTAL KNEE ARTHROPLASTY  01/02/2012   RIGHT KNEE  . TOTAL KNEE ARTHROPLASTY  01/02/2012   Procedure: TOTAL KNEE ARTHROPLASTY;  Surgeon: Vickey Huger, MD;  Location: [place];  Service: Orthopedics;  Laterality: Right;  . TOTAL KNEE ARTHROPLASTY Left 10/09/2017   Procedure: LEFT TOTAL KNEE ARTHROPLASTY;  Surgeon: Vickey Huger, MD;  Location: [facility];  Service: Orthopedics;  Laterality: Left;  with block    There were no vitals filed for this visit.  Subjective Assessment - 12/06/17 1603    Subjective  Patient reports she is having right groin pain 8/10 . She says that she needs a back brace.  Patient is accompained by:  Family member    Pertinent History  patient had surgery to left knee 10/09/17 and stayed in 1 night and then was DC home. She was ambulating with a SPC prior to surgery. She was walking at home with rollator in the house and outside of the house for 2 years. She has 1 step to get into her house. She had surgery on her back 10 years ago.     Limitations  Standing;Walking    Patient Stated Goals  to be able to walk without pain .     Currently in Pain?  Yes    Pain Score  8     Pain Location  Groin    Pain Orientation  Right    Pain Descriptors / Indicators  Aching    Pain Type  Chronic pain    Pain Onset  More than a month ago    Aggravating Factors   walking    Pain Relieving Factors  rest    Effect of Pain on Daily Activities  unablea to walk       Nustepx 65minutes to promote knee  mobility   Manual therapy ;  Thomas test + R hip, stretching 30 sec x 3 and roller stick to right quad to reduce pain  Therapeutic exercise:   Quad sets 3x10 with 3 second holds. Verbal and tactile cueing to squeeze quadriceps and promote activation  SAQ 2x103 second holdsverbal cuing to contract quad.  SLR 2x10 verbal cueing to keep knee straight.  LAQ seated 2x10 with 3 second holds. Verbal cues to contract quadriceps  Standing with LLE on second step with knee flex and 10 sec hold x 10 reps  Standing with LLE on second step and stretching into extensionx10secholdsx10reps                        PT Education - 12/06/17 1605    Education Details  HEP    Person(s) Educated  Patient    Methods  Explanation;Demonstration    Comprehension  Verbalized understanding;Returned demonstration       PT Short Term Goals - 10/23/17 1515      PT SHORT TERM GOAL #1   Title  Patient will be independent in home exercise program to improve strength/mobility for better functional independence with ADLs.    Time  4    Period  Weeks    Status  New    Target Date  11/20/17      PT SHORT TERM GOAL #2   Title  Patient (< 37 years old) will complete five times sit to stand test in < 10 seconds indicating an increased LE strength and improved balance.    Time  4    Period  Weeks    Status  New    Target Date  11/20/17        PT Long Term Goals - 10/23/17 1518      PT LONG TERM GOAL #1   Title  Patient will increase 10 meter walk test to >1.14m/s as to improve gait speed for better community ambulation and to reduce fall risk.    Time  8    Period  Weeks    Status  New    Target Date  12/18/17      PT LONG TERM GOAL #2   Title  Patient will reduce timed up and go to <11 seconds to reduce fall risk and demonstrate improved  transfer/gait ability.    Time  8    Period  Weeks    Status  New    Target Date  12/18/17      PT LONG TERM GOAL #3    Title  Patient will report a worst pain of 3/10 on VAS in  left knee           to improve tolerance with ADLs and reduced symptoms with activities.     Baseline  8/10 left knee    Time  8    Period  Weeks    Status  New    Target Date  12/18/17      PT LONG TERM GOAL #4   Title  Patient will increase BLE gross strength to 4+/5 as to improve functional strength for independent gait, increased standing tolerance and increased ADL ability.    Time  8    Period  Weeks    Status  New    Target Date  12/18/17            Plan - 12/06/17 1606    Clinical Impression Statement  Patient reports right groin pain that is limiting her ambulation. She responded to manual therapy to right groin to decrease her pain to right groin.Pt requires direction and verbal cues for correct performance of exercise and activities. Patient demonstrates difficulties strengthening against gravity and closed chain activities and has no reports of pain increases . Pt was able to perform all exercises with min assist and VC for technique.   Patient struggles with fatigue and repetitions.  Pt encouraged continuing HEP. Follow-up as scheduled.     Rehab Potential  Good    PT Frequency  2x / week    PT Duration  8 weeks    PT Treatment/Interventions  Gait training;Therapeutic exercise;Therapeutic activities;Functional mobility training;Stair training;Balance training;Patient/family education;Moist Heat;Cryotherapy;Neuromuscular re-education;Passive range of motion;Manual techniques;Scar mobilization;Ultrasound    PT Next Visit Plan  sit to stands, balance, strength, mobility    Consulted and Agree with Plan of Care  Patient       Patient will benefit from skilled therapeutic intervention in order to improve the following deficits and impairments:  Abnormal gait, Decreased balance, Decreased endurance, Decreased mobility, Difficulty walking, Decreased activity tolerance, Decreased safety awareness, Decreased strength,  Impaired flexibility, Pain  Visit Diagnosis: Muscle weakness (generalized)  Difficulty walking     Problem List Patient Active Problem List   Diagnosis Date Noted  . 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 prolapse 10/24/2017  . Depression, recurrent ([place]) 10/24/2017  . 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 Dysuria 10/21/2013  . Nasal lesion 07/14/2013  . Fatigue 04/02/2013  . Breast cancer ([place]) 12/02/2012  . CAD (coronary artery disease) 09/15/2012  . Essential hypertension, benign 09/15/2012  . Hypercholesterolemia 09/15/2012  . Hypothyroidism 09/15/2012  . GERD (gastroesophageal reflux disease) 09/15/2012  . Diverticulosis 09/15/2012  . Chronic back pain 09/15/2012  . Hypokalemia 09/15/2012    Alanson Puls, PT DPT 12/06/2017, 4:18 PM  [place] MAIN [facility] SERVICES [address] [place], [place], [address] Phone: [phone]   Fax:  [phone]  Name: Laurie Hale MRN: [id number] Date of Birth: 1940-10-15

## 2017-12-07 ENCOUNTER — Encounter: Payer: Medicare Other | Admitting: Physical Therapy

## 2017-12-11 ENCOUNTER — Ambulatory Visit: Payer: Medicare Other | Admitting: Physical Therapy

## 2017-12-11 ENCOUNTER — Encounter: Payer: Medicare Other | Admitting: Physical Therapy

## 2017-12-11 ENCOUNTER — Encounter: Payer: Self-pay | Admitting: Physical Therapy

## 2017-12-11 DIAGNOSIS — M6281 Muscle weakness (generalized): Secondary | ICD-10-CM | POA: Diagnosis not present

## 2017-12-11 DIAGNOSIS — R262 Difficulty in walking, not elsewhere classified: Secondary | ICD-10-CM

## 2017-12-11 NOTE — Therapy (Signed)
[place] MAIN [facility] SERVICES [address] [place], [place], [address] Phone: [phone]   Fax:  [phone]  Physical Therapy Treatment/ DC summary   Patient Details  Name: Laurie Hale MRN: [id number] Date of Birth: 1940-11-21 Referring Provider (PT): Vickey Huger, MD    Encounter Date: 12/11/2017  PT End of Session - 12/11/17 1541    Visit Number  10    Number of Visits  17    Date for PT Re-Evaluation  12/11/17    Authorization Type  10/10    PT Start Time  0335    PT Stop Time  0400    PT Time Calculation (min)  25 min    Equipment Utilized During Treatment  Gait belt    Activity Tolerance  Patient tolerated treatment well    Behavior During Therapy  [facility] for tasks assessed/performed       Past Medical History:  Diagnosis Date  . Arthritis   . 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 Cataract    immature bilateral  . Chronic back pain   . Constipation   . Coronary artery disease   . Diverticulosis   . GERD (gastroesophageal reflux disease)    takes a med daily-to bring with med name at surgery  . History of migraine    hasn't had one in yrs-per pt Zoloft stopped them  . Hyperlipidemia    takes Crestor and Niacin daily  . Hypertension    takes Amlodipine  and Lisinopril daily  . Hypokalemia    takes K Dur tid  . Hypothyroidism    takes Synthroid daily  . Insomnia    takes restoril prn  . Joint pain   . Joint swelling   . Peripheral edema    takes HCTZ daily  . Personal history of radiation therapy 2014   F/U lumpectomy   . Presence of pessary     Past Surgical History:  Procedure Laterality Date  . ABDOMINAL HYSTERECTOMY    . BACK SURGERY  2009  . BREAST BIOPSY Left 10/22/2012   Stereo - DCIS  . BREAST LUMPECTOMY Left 2014   DCIS. F/U radiation   . CARDIAC CATHETERIZATION  1999  . CATARACT EXTRACTION W/PHACO Right 10/05/2015   Procedure: CATARACT EXTRACTION PHACO AND INTRAOCULAR LENS PLACEMENT  (IOC);  Surgeon: Ronnell Freshwater, MD;  Location: [place];  Service: Ophthalmology;  Laterality: Right;  RIGHT  . CATARACT EXTRACTION W/PHACO Left 11/16/2015   Procedure: CATARACT EXTRACTION PHACO AND INTRAOCULAR LENS PLACEMENT (IOC);  Surgeon: Ronnell Freshwater, MD;  Location: [place];  Service: Ophthalmology;  Laterality: Left;  LEFT  . COLONOSCOPY    . CORONARY ANGIOPLASTY     1 stent  . partial coloectomy  1995  . right knee arthroscopy  2013  . TONSILLECTOMY    . TOTAL KNEE ARTHROPLASTY  01/02/2012   RIGHT KNEE  . TOTAL KNEE ARTHROPLASTY  01/02/2012   Procedure: TOTAL KNEE ARTHROPLASTY;  Surgeon: Vickey Huger, MD;  Location: [place];  Service: Orthopedics;  Laterality: Right;  . TOTAL KNEE ARTHROPLASTY Left 10/09/2017   Procedure: LEFT TOTAL KNEE ARTHROPLASTY;  Surgeon: Vickey Huger, MD;  [facility]: [facility];  Service: Orthopedics;  Laterality: Left;  with block    There were no vitals filed for this visit.  Subjective Assessment - 12/11/17 1539    Subjective  Patient reports she fell on saturday on her bottom.     Patient is  accompained by:  Family member    Pertinent History  patient had surgery to left knee 10/09/17 and stayed in 1 night and then was DC home. She was ambulating with a SPC prior to surgery. She was walking at home with rollator in the house and outside of the house for 2 years. She has 1 step to get into her house. She had surgery on her back 10 years ago.     Limitations  Standing;Walking    Patient Stated Goals  to be able to walk without pain .     Currently in Pain?  Yes    Pain Score  4     Pain Location  Back    Pain Orientation  Lower    Pain Descriptors / Indicators  Aching    Pain Type  Chronic pain    Pain Onset  More than a month ago      Treatment: Reviewed HEP and reviewed goals for DC today. She made progress towards all goals and is ready for DC Leg press 90 lbs x 20 x 3      OUTCOME MEASURES: TEST Outcome  Interpretation  5 times sit<>stand 19.46 sec >60 yo, >15 sec indicates increased risk for falls  10 meter walk test    .33             m/s <1.0 m/s indicates increased risk for falls; limited community ambulator  Timed up and Go      30.80       sec <14 sec indicates increased risk for falls                                 PT Education - 12/11/17 1540    Education Details  HEP    Person(s) Educated  Patient    Methods  Explanation    Comprehension  Verbalized understanding;Returned demonstration       PT Short Term Goals - 10/23/17 1515      PT SHORT TERM GOAL #1   Title  Patient will be independent in home exercise program to improve strength/mobility for better functional independence with ADLs.    Time  4    Period  Weeks    Status  New    Target Date  11/20/17      PT SHORT TERM GOAL #2   Title  Patient (< 64 years old) will complete five times sit to stand test in < 10 seconds indicating an increased LE strength and improved balance.    Time  4    Period  Weeks    Status  New    Target Date  11/20/17        PT Long Term Goals - 12/11/17 1541      PT LONG TERM GOAL #1   Title  Patient will increase 10 meter walk test to >1.49ms as to improve gait speed for better community ambulation and to reduce fall risk.    Baseline  12/11/17= .33 m/sec    Time  8    Period  Weeks    Status  Partially Met    Target Date  12/11/17      PT LONG TERM GOAL #2   Title  Patient will reduce timed up and go to <11 seconds to reduce fall risk and demonstrate improved transfer/gait ability.    Baseline  12/11/08=30.80 sec    Time  8  Period  Weeks    Status  Partially Met    Target Date  12/11/17      PT LONG TERM GOAL #3   Title  Patient will report a worst pain of 3/10 on VAS in  left knee           to improve tolerance with ADLs and reduced symptoms with activities.     Baseline  8/10 left knee; 12/11/17 0/10 pain to left knee    Time  8    Period   Weeks    Status  Achieved    Target Date  12/11/17      PT LONG TERM GOAL #4   Title  Patient will increase BLE gross strength to 4+/5 as to improve functional strength for independent gait, increased standing tolerance and increased ADL ability.    Baseline  4/5 BLE     Time  8    Period  Weeks    Status  Partially Met    Target Date  12/11/17            Plan - 12/11/17 1553    Clinical Impression Statement  Patient had a fall over the weekend and now her back is sore. She was bending over and she went forward. Her knee ROM and strength are WFL and she is independent with HEP. She will be dC from skilled PT to HEP.     Rehab Potential  Good    PT Frequency  2x / week    PT Duration  8 weeks    PT Treatment/Interventions  Gait training;Therapeutic exercise;Therapeutic activities;Functional mobility training;Stair training;Balance training;Patient/family education;Moist Heat;Cryotherapy;Neuromuscular re-education;Passive range of motion;Manual techniques;Scar mobilization;Ultrasound    PT Next Visit Plan  sit to stands, balance, strength, mobility    Consulted and Agree with Plan of Care  Patient       Patient will benefit from skilled therapeutic intervention in order to improve the following deficits and impairments:  Abnormal gait, Decreased balance, Decreased endurance, Decreased mobility, Difficulty walking, Decreased activity tolerance, Decreased safety awareness, Decreased strength, Impaired flexibility, Pain  Visit Diagnosis: Muscle weakness (generalized)  Difficulty walking     Problem List Patient Active Problem List   Diagnosis Date Noted  . 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 prolapse 10/24/2017  . Depression, recurrent ([place]) 10/24/2017  . 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 Dysuria 10/21/2013  . Nasal lesion 07/14/2013  . Fatigue 04/02/2013  . Breast cancer ([place]) 12/02/2012  . CAD (coronary artery disease) 09/15/2012  . Essential hypertension, benign 09/15/2012  .  Hypercholesterolemia 09/15/2012  . Hypothyroidism 09/15/2012  . GERD (gastroesophageal reflux disease) 09/15/2012  . Diverticulosis 09/15/2012  . Chronic back pain 09/15/2012  . Hypokalemia 09/15/2012    Alanson Puls, PT DPT 12/11/2017, 3:54 PM  [place] MAIN [facility] SERVICES [address] [place], [place], [address] Phone: [phone]   Fax:  [phone]  Name: BRITTNYE JOSEPHS MRN: [id number] Date of Birth: 05-16-1940

## 2017-12-13 ENCOUNTER — Ambulatory Visit: Payer: Medicare Other | Admitting: Physical Therapy

## 2017-12-14 ENCOUNTER — Encounter: Payer: Medicare Other | Admitting: Physical Therapy

## 2017-12-18 ENCOUNTER — Encounter: Payer: Medicare Other | Admitting: Physical Therapy

## 2017-12-20 ENCOUNTER — Encounter: Payer: Medicare Other | Admitting: Physical Therapy

## 2017-12-25 ENCOUNTER — Encounter: Payer: Medicare Other | Admitting: Physical Therapy

## 2017-12-27 ENCOUNTER — Encounter: Payer: Medicare Other | Admitting: Physical Therapy

## 2018-01-23 ENCOUNTER — Ambulatory Visit: Payer: Medicare Other | Admitting: Family Medicine

## 2018-10-18 ENCOUNTER — Encounter: Payer: Self-pay | Admitting: Emergency Medicine

## 2018-10-18 ENCOUNTER — Emergency Department
Admission: EM | Admit: 2018-10-18 | Discharge: 2018-10-18 | Disposition: A | Discharge status: Home / Self Care | Payer: Medicare Other | Attending: Student in an Organized Health Care Education/Training Program | Admitting: Student in an Organized Health Care Education/Training Program

## 2018-10-18 ENCOUNTER — Other Ambulatory Visit: Payer: Self-pay

## 2018-10-18 DIAGNOSIS — I2510 Atherosclerotic heart disease of native coronary artery without angina pectoris: Secondary | ICD-10-CM | POA: Diagnosis not present

## 2018-10-18 DIAGNOSIS — Z79899 Other long term (current) drug therapy: Secondary | ICD-10-CM | POA: Diagnosis not present

## 2018-10-18 DIAGNOSIS — I10 Essential (primary) hypertension: Secondary | ICD-10-CM | POA: Insufficient documentation

## 2018-10-18 DIAGNOSIS — Z7982 Long term (current) use of aspirin: Secondary | ICD-10-CM | POA: Diagnosis not present

## 2018-10-18 DIAGNOSIS — R208 Other disturbances of skin sensation: Secondary | ICD-10-CM | POA: Diagnosis not present

## 2018-10-18 LAB — CBC WITH DIFFERENTIAL/PLATELET
Abs Immature Granulocytes: 0.02 10*3/uL (ref 0.00–0.07)
Basophils Absolute: 0 10*3/uL (ref 0.0–0.1)
Basophils Relative: 0 %
Eosinophils Absolute: 0 10*3/uL (ref 0.0–0.5)
Eosinophils Relative: 0 %
HCT: 43.7 % (ref 36.0–46.0)
Hemoglobin: 14.7 g/dL (ref 12.0–15.0)
Immature Granulocytes: 0 %
Lymphocytes Relative: 11 %
Lymphs Abs: 0.9 10*3/uL (ref 0.7–4.0)
MCH: 29.9 pg (ref 26.0–34.0)
MCHC: 33.6 g/dL (ref 30.0–36.0)
MCV: 88.8 fL (ref 80.0–100.0)
Monocytes Absolute: 0.3 10*3/uL (ref 0.1–1.0)
Monocytes Relative: 3 %
Neutro Abs: 6.9 10*3/uL (ref 1.7–7.7)
Neutrophils Relative %: 86 %
Platelets: 237 10*3/uL (ref 150–400)
RBC: 4.92 MIL/uL (ref 3.87–5.11)
RDW: 13.1 % (ref 11.5–15.5)
WBC: 8.1 10*3/uL (ref 4.0–10.5)
nRBC: 0 % (ref 0.0–0.2)

## 2018-10-18 LAB — COMPREHENSIVE METABOLIC PANEL
ALT: 15 U/L (ref 0–44)
AST: 19 U/L (ref 15–41)
Albumin: 4.2 g/dL (ref 3.5–5.0)
Alkaline Phosphatase: 49 U/L (ref 38–126)
Anion gap: 12 (ref 5–15)
BUN: 15 mg/dL (ref 8–23)
CO2: 29 mmol/L (ref 22–32)
Calcium: 10.4 mg/dL — ABNORMAL HIGH (ref 8.9–10.3)
Chloride: 101 mmol/L (ref 98–111)
Creatinine, Ser: 0.94 mg/dL (ref 0.44–1.00)
GFR calc Af Amer: >60 mL/min (ref >60)
GFR calc non Af Amer: 57 mL/min — ABNORMAL LOW (ref >60)
Glucose, Bld: 136 mg/dL — ABNORMAL HIGH (ref 70–99)
Potassium: 4 mmol/L (ref 3.5–5.1)
Sodium: 142 mmol/L (ref 135–145)
Total Bilirubin: 0.4 mg/dL (ref 0.3–1.2)
Total Protein: 7.6 g/dL (ref 6.5–8.1)

## 2018-10-18 LAB — URINALYSIS, COMPLETE (UACMP) WITH MICROSCOPIC
Bacteria, UA: NONE SEEN
Bilirubin Urine: NEGATIVE
Glucose, UA: NEGATIVE mg/dL
Hgb urine dipstick: NEGATIVE
Ketones, ur: NEGATIVE mg/dL
Leukocytes,Ua: NEGATIVE
Nitrite: NEGATIVE
Protein, ur: NEGATIVE mg/dL
Specific Gravity, Urine: 1.012 (ref 1.005–1.030)
pH: 5 (ref 5.0–8.0)

## 2018-10-18 NOTE — ED Provider Notes (Signed)
[facility] Emergency Department Provider Note ____________________________________________  Time seen: Approximately 4:41 PM  I have reviewed the triage vital signs and the nursing notes.   HISTORY  Chief Complaint Cold Extremity    HPI Laurie Hale is a 78 y.o. female who presents to the emergency department for evaluation and treatment of bilateral lower extremity coolness.  Patient states that this is chronic but typically resolves with elevation and application of a warm blanket.  She became concerned today because it did not resolve as usual.  The pattern and location of the coolness with associated tingling is the same today as it has been in the past.  Her PCP advised that she come to the emergency department for evaluation.  Past Medical History:  Diagnosis Date  . Arthritis   . 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 Cataract    immature bilateral  . Chronic back pain   . Constipation   . Coronary artery disease   . Diverticulosis   . GERD (gastroesophageal reflux disease)    takes a med daily-to bring with med name at surgery  . History of migraine    hasn't had one in yrs-per pt Zoloft stopped them  . Hyperlipidemia    takes Crestor and Niacin daily  . Hypertension    takes Amlodipine  and Lisinopril daily  . Hypokalemia    takes K Dur tid  . Hypothyroidism    takes Synthroid daily  . Insomnia    takes restoril prn  . Joint pain   . Joint swelling   . Peripheral edema    takes HCTZ daily  . Personal history of radiation therapy 2014   F/U lumpectomy   . Presence of pessary     Patient Active Problem List   Diagnosis Date Noted  . 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 prolapse 10/24/2017  . Depression, recurrent ([place]) 10/24/2017  . 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 Dysuria 10/21/2013  . Nasal lesion 07/14/2013  . Fatigue 04/02/2013  . Breast cancer ([place]) 12/02/2012  . CAD (coronary artery disease) 09/15/2012  . Essential  hypertension, benign 09/15/2012  . Hypercholesterolemia 09/15/2012  . Hypothyroidism 09/15/2012  . GERD (gastroesophageal reflux disease) 09/15/2012  . Diverticulosis 09/15/2012  . Chronic back pain 09/15/2012  . Hypokalemia 09/15/2012    Past Surgical History:  Procedure Laterality Date  . ABDOMINAL HYSTERECTOMY    . BACK SURGERY  2009  . BREAST BIOPSY Left 10/22/2012   Stereo - DCIS  . BREAST LUMPECTOMY Left 2014   DCIS. F/U radiation   . CARDIAC CATHETERIZATION  1999  . CATARACT EXTRACTION W/PHACO Right 10/05/2015   Procedure: CATARACT EXTRACTION PHACO AND INTRAOCULAR LENS PLACEMENT (IOC);  Surgeon: Ronnell Freshwater, MD;  Location: [place];  Service: Ophthalmology;  Laterality: Right;  RIGHT  . CATARACT EXTRACTION W/PHACO Left 11/16/2015   Procedure: CATARACT EXTRACTION PHACO AND INTRAOCULAR LENS PLACEMENT (IOC);  Surgeon: Ronnell Freshwater, MD;  Location: [place];  Service: Ophthalmology;  Laterality: Left;  LEFT  . COLONOSCOPY    . CORONARY ANGIOPLASTY     1 stent  . partial coloectomy  1995  . right knee arthroscopy  2013  . TONSILLECTOMY    . TOTAL KNEE ARTHROPLASTY  01/02/2012   RIGHT KNEE  . TOTAL KNEE ARTHROPLASTY  01/02/2012   Procedure: TOTAL KNEE ARTHROPLASTY;  Surgeon: Vickey Huger, MD;  Location: [place];  Service: Orthopedics;  Laterality: Right;  . TOTAL KNEE ARTHROPLASTY Left 10/09/2017  Procedure: LEFT TOTAL KNEE ARTHROPLASTY;  Surgeon: Vickey Huger, MD;  [facility];  Service: Orthopedics;  Laterality: Left;  with block    Prior to Admission medications   Medication Sig Start Date End Date Taking? Authorizing Provider  aspirin 325 MG EC tablet Take 1 tablet (325 mg total) by mouth 2 (two) times daily. 10/10/17   Donia Ast, PA  levothyroxine (SYNTHROID, LEVOTHROID) 88 MCG tablet Take 88 mcg by mouth at bedtime.    [provider]  lisinopril (PRINIVIL,ZESTRIL) 40 MG tablet Take 1 tablet (40 mg total)  by mouth daily. 01/31/14   Einar Pheasant, MD  meloxicam (MOBIC) 7.5 MG tablet Take 7.5 mg by mouth daily.    [provider]  morphine (MS CONTIN) 100 MG 12 hr tablet Take 100 mg by mouth every 8 (eight) hours as needed for pain.    [provider]  morphine (MSIR) 15 MG tablet Take 15 mg by mouth every 6 (six) hours as needed (for pain).    [provider]  rosuvastatin (CRESTOR) 20 MG tablet Take 20 mg by mouth every Monday, Wednesday, and Friday.    [provider]  sertraline (ZOLOFT) 100 MG tablet Take 1 tablet (100 mg total) by mouth daily. Patient taking differently: Take 150 mg by mouth daily.  07/11/13   Einar Pheasant, MD    Allergies Ciprofloxacin and Penicillins  Family History  Problem Relation Age of Onset  . Hypertension Mother   . Sudden death Sister        x2  . Breast cancer Sister   . Heart disease Brother     Social History Social History   Tobacco Use  . Smoking status: Never Smoker  . Smokeless tobacco: Never Used  Substance Use Topics  . Alcohol use: No  . Drug use: No    Review of Systems Constitutional: Negative for fever. Cardiovascular: Negative for chest pain. Respiratory: Negative for shortness of breath. Musculoskeletal: Negative for weakness of the extremities. Skin/vascular: Positive for decreased skin temp and bilateral lower extremities Neurological: Positive for decrease in sensation in bilateral feet  ____________________________________________   PHYSICAL EXAM:  VITAL SIGNS: ED Triage Vitals [10/18/18 1622]  Enc Vitals Group     BP (!) 170/92     Pulse Rate (!) 102     Resp 18     Temp 98.5 F (36.9 C)     Temp Source Oral     SpO2 99 %     Weight 150 lb (68 kg)     Height 5\' 1"  (1.549 m)     Head Circumference      Peak Flow      Pain Score 0     Pain Loc      Pain Edu?      Excl. in [place]?     Constitutional: Alert and oriented. Well appearing and in no acute distress. Eyes:  Conjunctivae are clear without discharge or drainage Head: Atraumatic Neck: Supple. Respiratory: No cough. Respirations are even and unlabored. Musculoskeletal: Patient is able to demonstrate bilateral straight leg raise and bilateral knee flexion.  Neurologic: Strength of lower extremities 5/5.   Skin/vascular: Slight difference in skin temperature noted in the bilateral lower extremities.  Skin is somewhat cooler on the medial aspects of the calves and diffuse coolness in the ankles and feet.  There is no difference in skin color.  Skin is blanchable.  Capillary refill is about 3.  Dorsalis pedis is 2+ bilaterally. Psychiatric:  Affect and behavior are appropriate.  ____________________________________________   LABS (all labs ordered are listed, but only abnormal results are displayed)  Labs Reviewed  URINALYSIS, COMPLETE (UACMP) WITH MICROSCOPIC - Abnormal; Notable for the following components:      Result Value   Color, Urine YELLOW (*)    APPearance CLEAR (*)    All other components within normal limits  COMPREHENSIVE METABOLIC PANEL - Abnormal; Notable for the following components:   Glucose, Bld 136 (*)    Calcium 10.4 (*)    GFR calc non Af Amer 57 (*)    All other components within normal limits  CBC WITH DIFFERENTIAL/PLATELET   ____________________________________________  RADIOLOGY  Not indicated ____________________________________________   PROCEDURES  Procedures  ____________________________________________   INITIAL IMPRESSION / ASSESSMENT AND PLAN / ED COURSE  Laurie Hale is a 78 y.o. who presents to the emergency department for evaluation of decrease in skin temperature and radiculopathy of the lower extremities.  Labs were obtained to ensure that electrolytes are all within normal limits and that there is no indication of any infection.  I am unable to get an arterial doppler ultrasound through the emergency department.  It needs to be scheduled as an  outpatient. I do not feel that she needs a CTA with runoff as there is no indication of an ischemic limb.  As she was here in the department, she states that her symptoms are resolving. The numbness that she is describing is currently in her feet.  She reports this is chronic.  She is still able to ambulate with her cane and is steady.  Patient instructed to follow-up with primary care.  She was also instructed to return to the emergency department for symptoms that change or worsen if unable schedule an appointment.  Medications - No data to display  Pertinent labs & imaging results that were available during my care of the patient were reviewed by me and considered in my medical decision making (see chart for details).  _________________________________________   FINAL CLINICAL IMPRESSION(S) / ED DIAGNOSES  Final diagnoses:  Decreased sensation of lower extremity  Other disturbances of skin sensation    ED Discharge Orders    None       If controlled substance prescribed during this visit, 12 month history viewed on the [place] prior to issuing an initial prescription for Schedule II or III opiod.   Victorino Dike, FNP 10/18/18 2209    Merlyn Lot, MD 10/18/18 [phone]

## 2018-10-18 NOTE — ED Triage Notes (Signed)
Pt presents to ED via POV with c/o feeling cold in her legs bilaterally, pt states sensation started earlier today, skin warm to the touch, + pedal pulses, +movement, +sensation. Pt states hx of "my feet get numb and real cold and I put them in that blanket then they are alright".

## 2018-10-18 NOTE — Discharge Instructions (Signed)
Please follow up with primary care.  Continue the Aspirin daily as prescribed.

## 2018-10-18 NOTE — ED Notes (Signed)
See triage note  Presents with some cold feeling to both and numbness also  Good pulses at present   No swelling

## 2019-04-06 ENCOUNTER — Ambulatory Visit: Payer: Medicare Other | Attending: Internal Medicine

## 2019-04-06 ENCOUNTER — Other Ambulatory Visit: Payer: Self-pay

## 2019-04-06 DIAGNOSIS — Z23 Encounter for immunization: Secondary | ICD-10-CM | POA: Insufficient documentation

## 2019-04-06 NOTE — Progress Notes (Signed)
   Covid-19 Vaccination Clinic  Name:  BATINA LOHREY    MRN: [id number] DOB: 1940/07/17  04/06/2019  Ms. Fetzer was observed post Covid-19 immunization for 15 minutes without incidence. She was provided with Vaccine Information Sheet and instruction to access the V-Safe system.   Ms. Wetmore was instructed to call 911 with any severe reactions post vaccine: Marland Kitchen Difficulty breathing  . Swelling of your face and throat  . A fast heartbeat  . A bad rash all over your body  . Dizziness and weakness    Immunizations Administered    Name Date Dose VIS Date Route   Pfizer COVID-19 Vaccine 04/06/2019  2:14 PM 0.3 mL 01/25/2019 Intramuscular   Manufacturer: [place]   Lot: [id number]   [place]: [id number]

## 2019-05-01 ENCOUNTER — Ambulatory Visit: Payer: Medicare Other | Attending: Internal Medicine

## 2019-05-01 DIAGNOSIS — Z23 Encounter for immunization: Secondary | ICD-10-CM

## 2019-05-01 NOTE — Progress Notes (Signed)
   Covid-19 Vaccination Clinic  Name:  Laurie Hale    MRN: [id number] DOB: 10/08/1940  05/01/2019  [name] was observed post Covid-19 immunization for 15 minutes without incident. She was provided with Vaccine Information Sheet and instruction to access the V-Safe system.   [name] was instructed to call 911 with any severe reactions post vaccine: Marland Kitchen Difficulty breathing  . Swelling of face and throat  . A fast heartbeat  . A bad rash all over body  . Dizziness and weakness   Immunizations Administered    Name Date Dose VIS Date Route   Pfizer COVID-19 Vaccine 05/01/2019  1:27 PM 0.3 mL 01/25/2019 Intramuscular   Manufacturer: [place]   Lot: [id number]   [place]: [id number]

## 2019-07-30 ENCOUNTER — Other Ambulatory Visit: Payer: Self-pay

## 2019-07-30 ENCOUNTER — Ambulatory Visit: Payer: Medicare Other | Attending: Family Medicine

## 2019-07-30 DIAGNOSIS — R262 Difficulty in walking, not elsewhere classified: Secondary | ICD-10-CM | POA: Diagnosis present

## 2019-07-30 DIAGNOSIS — R2681 Unsteadiness on feet: Secondary | ICD-10-CM

## 2019-07-30 DIAGNOSIS — M6281 Muscle weakness (generalized): Secondary | ICD-10-CM | POA: Diagnosis present

## 2019-07-30 NOTE — Therapy (Signed)
[facility] [address], [place], [address] Phone: [phone]   Fax:  [phone]  Physical Therapy Evaluation  Patient Details  Name: Laurie Hale MRN: [id number] Date of Birth: 01-27-1941 Referring Provider (PT): Juluis Pitch, MD   Encounter Date: 07/30/2019   PT End of Session - 07/30/19 1700    Visit Number 1    Number of Visits 16    Date for PT Re-Evaluation 09/24/19    Authorization Type MCR    Authorization Time Period 07/30/19-09/24/19    PT Start Time 7124    PT Stop Time 5809    PT Time Calculation (min) 52 min    Equipment Utilized During Treatment Gait belt    Activity Tolerance Patient tolerated treatment well;Patient limited by fatigue;No increased pain    Behavior During Therapy WFL for tasks assessed/performed           Past Medical History:  Diagnosis Date  . Arthritis   . 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 Cataract    immature bilateral  . Chronic back pain   . Constipation   . Coronary artery disease   . Diverticulosis   . GERD (gastroesophageal reflux disease)    takes a med daily-to bring with med name at surgery  . History of migraine    hasn't had one in yrs-per pt Zoloft stopped them  . Hyperlipidemia    takes Crestor and Niacin daily  . Hypertension    takes Amlodipine  and Lisinopril daily  . Hypokalemia    takes K Dur tid  . Hypothyroidism    takes Synthroid daily  . Insomnia    takes restoril prn  . Joint pain   . Joint swelling   . Peripheral edema    takes HCTZ daily  . Personal history of radiation therapy 2014   F/U lumpectomy   . Presence of pessary     Past Surgical History:  Procedure Laterality Date  . ABDOMINAL HYSTERECTOMY    . BACK SURGERY  2009  . BREAST BIOPSY Left 10/22/2012   Stereo - DCIS  . BREAST LUMPECTOMY Left 2014   DCIS. F/U radiation   . CARDIAC CATHETERIZATION  1999  . CATARACT EXTRACTION W/PHACO Right  10/05/2015   Procedure: CATARACT EXTRACTION PHACO AND INTRAOCULAR LENS PLACEMENT (IOC);  Surgeon: Ronnell Freshwater, MD;  Location: [facility];  Service: Ophthalmology;  Laterality: Right;  RIGHT  . CATARACT EXTRACTION W/PHACO Left 11/16/2015   Procedure: CATARACT EXTRACTION PHACO AND INTRAOCULAR LENS PLACEMENT (IOC);  Surgeon: Ronnell Freshwater, MD;  Location: [facility];  Service: Ophthalmology;  Laterality: Left;  LEFT  . COLONOSCOPY    . CORONARY ANGIOPLASTY     1 stent  . partial coloectomy  1995  . right knee arthroscopy  2013  . TONSILLECTOMY    . TOTAL KNEE ARTHROPLASTY  01/02/2012   RIGHT KNEE  . TOTAL KNEE ARTHROPLASTY  01/02/2012   Procedure: TOTAL KNEE ARTHROPLASTY;  Surgeon: Vickey Huger, MD;  Location: [facility];  Service: Orthopedics;  Laterality: Right;  . TOTAL KNEE ARTHROPLASTY Left 10/09/2017   Procedure: LEFT TOTAL KNEE ARTHROPLASTY;  Surgeon: Vickey Huger, MD;  Location: [facility];  Service: Orthopedics;  Laterality: Left;  with block    There were no vitals filed for this visit.    Subjective Assessment - 07/30/19 1613    Subjective Laurie Hale is a 4yoF who reports insidious onset weakness  of bilat legs. Pt has hjad increased falls. Pt is referred by PCP.    Patient is accompained by: Family member    Pertinent History History of bilat TKA, still has pain with Left side.    How long can you sit comfortably? not limited    How long can you stand comfortably? 10 minutes    How long can you walk comfortably? 5-10 minutes    Currently in Pain? Yes    Pain Score 7     Pain Location Knee    Pain Orientation Left    Pain Descriptors / Indicators Sore    Pain Type Chronic pain              [facility] PT Assessment - 07/30/19 0001      Assessment   Medical Diagnosis Generalized weakness, falls    Referring Provider (PT) Juluis Pitch, MD    Onset Date/Surgical Date --   June 2020   Hand Dominance Right    Prior Therapy OPPT for TKA 2019         Precautions   Precautions Fall      Restrictions   Weight Bearing Restrictions No      Balance Screen   Has the patient fallen in the past 6 months Yes    How many times? 3    Has the patient had a decrease in activity level because of a fear of falling?  Yes    Is the patient reluctant to leave their home because of a fear of falling?  Yes      [place] Private residence    Living Arrangements Spouse/significant other;Children   Son lives at home, works first shift   Available Help at Discharge Family    Type of [place] to enter   1   Entrance Stairs-Number of Steps --   1   [place] One level    [place] - 4 wheels;Kas[place] - single point   uses 4[employer] in home, [facility] in community     Prior Function   Level of Independence Independent with basic ADLs;Independent with household mobility with device    Leisure Used to work in yard       Observation/Other Assessments   Focus on Therapeutic Outcomes (FOTO)  50/100      Sensation   Light Touch Appears Intact   per patient report     ROM / Strength   AROM / PROM / Strength Strength      Strength   Strength Assessment Site Hip;Knee;Ankle    Right/Left Hip Right;Left    Right Hip Flexion 5/5    Right Hip Extension --   manually resisted at 90* seated   Right Hip External Rotation  5/5    Right Hip Internal Rotation 5/5    Right Hip ABduction --   horizontal ABDCT: 5/5   Left Hip Flexion 4+/5    Left Hip Extension 5/5   manually resisted at 90* seated   Left Hip External Rotation 5/5    Left Hip Internal Rotation 4/5    Left Hip ABduction --   horizontal ABDCT: 5/5   Right/Left Knee Right;Left    Right Knee Flexion 5/5    Right Knee Extension 5/5    Left Knee Flexion 4+/5    Left Knee Extension 4+/5    Right/Left Ankle Right;Left    Right Ankle  Dorsiflexion 5/5   ROM appears WFL    Right Ankle Plantar Flexion 5/5   manually  resisted   Left Ankle Dorsiflexion 5/5   ROM appears WFL    Left Ankle Plantar Flexion 5/5   manually resisted     Transfers   Five time sit to stand comments  19.24sec    Comments BUE push off seat, no AD       Ambulation/Gait   Ambulation/Gait Yes    Ambulation/Gait Assistance 6: Modified independent (Device/Increase time)    Ambulation Distance (Feet) 225 Feet    Assistive device Straight cane    Gait Pattern --   2-point SPC gait, SPC in RUE sequenced with LLE;    Ambulation Surface Level;Indoor    Gait velocity 0.2m/s    Gait Comments SOB after 165ft           Balance screening performed at eval: Normal stance unsupported: independent  Narrow stance: independent Narrow stance eyes closed: supervision level, increased sway, no LOB, no falls anxiety  semi-tandem stance bilat: minA for stepping, supervision for hold SLS: unable, but attempts without LOB stepping in place: "similar above, no support need, no LOB 90 degree turning: slow, but confident, no LOB *will perform BBT next session.    Objective measurements completed on examination: See above findings.       PT Short Term Goals - 07/30/19 1710      PT SHORT TERM GOAL #1   Title After 4 weeks pt to demonstrate improved AMB tolerance >460ft c AD ad lib.    Baseline AMB ~248ft at eval    Time 4    Period Weeks    Status New    Target Date 08/27/19             PT Long Term Goals - 07/30/19 1713      PT LONG TERM GOAL #1   Title After 8 weeks pt to demonstrate improved score on FOTO survery >65 to improve independence in ADL, IADL.    Baseline FOTO: 50/100 at eval    Time 8    Period Weeks    Status New    Target Date 09/24/19      PT LONG TERM GOAL #2   Title Pt to demonstrate improved 5xSTS time in <13 to aid improved funcitonal strength/power for transfers.    Baseline >17sec at eval    Time 8    Period Weeks    Status New    Target Date 09/24/19      PT LONG TERM GOAL #3   Title Pt to  demonstrate imrpoved maximal gait speed AEB 10MWT> 0.49m/s to facilitate ability to mobilizae outside of the home.    Time 8    Period Weeks    Status New    Target Date 09/24/19                  Plan - 07/30/19 1702    Clinical Impression Statement Pt presented to OPPT for instability of gait and progressive BLE weakness. Examination reveals strength impairment, but more notably difficulty with balance in SLS scenarios and stepping strategy. Pt also limited by SOB durign sustained AMB, only tolerating about 242ft AMB. Pt will benefit from skilled PT to address deficits and impairment idenetifed in evlauation to improve strength, activity tolerance, and reduce her falls risk, in order to return to PLOF.    Personal Factors and Comorbidities Age;Time since onset of injury/illness/exacerbation;Behavior Pattern    Examination-Activity  Limitations Bend;Carry;Stand;Stairs;Lift;Transfers;Locomotion Level;Squat    Examination-Participation Restrictions Estate agent;Yard Work    Stability/Clinical Decision Making Stable/Uncomplicated    Clinical Decision Making Low    Rehab Potential Good    PT Frequency 2x / week    PT Duration 8 weeks    PT Treatment/Interventions Gait training;Therapeutic exercise;Therapeutic activities;Functional mobility training;Stair training;Balance training;Patient/family education;Moist Heat;Cryotherapy;Neuromuscular re-education;Passive range of motion;Manual techniques;Ultrasound;ADLs/Self Care Home Management;Electrical Stimulation;DME Instruction;Cognitive remediation;Dry needling    PT Next Visit Plan BBT, start strengthening program, put together HEP    PT Home Exercise Plan deferred    Consulted and Agree with Plan of Care Patient;Family member/caregiver    Family Member Consulted husband           Patient will benefit from skilled therapeutic intervention in order to improve the following deficits and impairments:  Abnormal gait,  Decreased balance, Decreased endurance, Decreased mobility, Difficulty walking, Decreased activity tolerance, Decreased safety awareness, Decreased strength, Postural dysfunction  Visit Diagnosis: Muscle weakness (generalized)  Difficulty walking  Unsteadiness on feet     Problem List Patient Active Problem List   Diagnosis Date Noted  . 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 prolapse 10/24/2017  . Depression, recurrent ([place]) 10/24/2017  . 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 Dysuria 10/21/2013  . Nasal lesion 07/14/2013  . Fatigue 04/02/2013  . Breast cancer ([place]) 12/02/2012  . CAD (coronary artery disease) 09/15/2012  . Essential hypertension, benign 09/15/2012  . Hypercholesterolemia 09/15/2012  . Hypothyroidism 09/15/2012  . GERD (gastroesophageal reflux disease) 09/15/2012  . Diverticulosis 09/15/2012  . Chronic back pain 09/15/2012  . Hypokalemia 09/15/2012   5:27 PM, 07/30/19 Etta Grandchild, PT, DPT Physical Therapist - [place] [phone] (Office)   Aradhana Gin C 07/30/2019, 5:21 PM  [facility] [address], [place], [address] Phone: [phone]   Fax:  [phone]  Name: Laurie Hale MRN: [id number] Date of Birth: 03/19/1940

## 2019-07-31 ENCOUNTER — Encounter: Payer: Medicare Other | Admitting: Physical Therapy

## 2019-08-01 ENCOUNTER — Ambulatory Visit: Payer: Medicare Other

## 2019-08-01 ENCOUNTER — Other Ambulatory Visit: Payer: Self-pay

## 2019-08-01 DIAGNOSIS — M6281 Muscle weakness (generalized): Secondary | ICD-10-CM | POA: Diagnosis not present

## 2019-08-01 DIAGNOSIS — R2681 Unsteadiness on feet: Secondary | ICD-10-CM

## 2019-08-01 DIAGNOSIS — R262 Difficulty in walking, not elsewhere classified: Secondary | ICD-10-CM

## 2019-08-01 NOTE — Therapy (Signed)
[facility] [address] [place], [address] Phone: [phone]   Fax:  [phone]  Physical Therapy Treatment  Patient Details  Name: Laurie Hale MRN: [id number] Date of Birth: May 11, 1940 Referring Provider (PT): Juluis Pitch, MD   Encounter Date: 08/01/2019   PT End of Session - 08/01/19 1442    Visit Number 2    Number of Visits 16    Date for PT Re-Evaluation 09/24/19    Authorization Type MCR    Authorization Time Period 07/30/19-09/24/19    PT Start Time 1358    PT Stop Time 1436    PT Time Calculation (min) 38 min    Equipment Utilized During Treatment Gait belt    Activity Tolerance Patient tolerated treatment well;Patient limited by fatigue;No increased pain    Behavior During Therapy WFL for tasks assessed/performed           Past Medical History:  Diagnosis Date  . Arthritis   . 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 Cataract    immature bilateral  . Chronic back pain   . Constipation   . Coronary artery disease   . Diverticulosis   . GERD (gastroesophageal reflux disease)    takes a med daily-to bring with med name at surgery  . History of migraine    hasn't had one in yrs-per pt Zoloft stopped them  . Hyperlipidemia    takes Crestor and Niacin daily  . Hypertension    takes Amlodipine  and Lisinopril daily  . Hypokalemia    takes K Dur tid  . Hypothyroidism    takes Synthroid daily  . Insomnia    takes restoril prn  . Joint pain   . Joint swelling   . Peripheral edema    takes HCTZ daily  . Personal history of radiation therapy 2014   F/U lumpectomy   . Presence of pessary     Past Surgical History:  Procedure Laterality Date  . ABDOMINAL HYSTERECTOMY    . BACK SURGERY  2009  . BREAST BIOPSY Left 10/22/2012   Stereo - DCIS  . BREAST LUMPECTOMY Left 2014   DCIS. F/U radiation   . CARDIAC CATHETERIZATION  1999  . CATARACT EXTRACTION W/PHACO Right  10/05/2015   Procedure: CATARACT EXTRACTION PHACO AND INTRAOCULAR LENS PLACEMENT (IOC);  Surgeon: Ronnell Freshwater, MD;  Location: [facility];  Service: Ophthalmology;  Laterality: Right;  RIGHT  . CATARACT EXTRACTION W/PHACO Left 11/16/2015   Procedure: CATARACT EXTRACTION PHACO AND INTRAOCULAR LENS PLACEMENT (IOC);  Surgeon: Ronnell Freshwater, MD;  Location: [facility];  Service: Ophthalmology;  Laterality: Left;  LEFT  . COLONOSCOPY    . CORONARY ANGIOPLASTY     1 stent  . partial coloectomy  1995  . right knee arthroscopy  2013  . TONSILLECTOMY    . TOTAL KNEE ARTHROPLASTY  01/02/2012   RIGHT KNEE  . TOTAL KNEE ARTHROPLASTY  01/02/2012   Procedure: TOTAL KNEE ARTHROPLASTY;  Surgeon: Vickey Huger, MD;  Location: [facility];  Service: Orthopedics;  Laterality: Right;  . TOTAL KNEE ARTHROPLASTY Left 10/09/2017   Procedure: LEFT TOTAL KNEE ARTHROPLASTY;  Surgeon: Vickey Huger, MD;  [facility];  Service: Orthopedics;  Laterality: Left;  with block    There were no vitals filed for this visit.   Subjective Assessment - 08/01/19 1411    Subjective Pt reports she is doing ok today. Has some insidious pain  in Left hip. No falls sinc elast visit.    Patient is accompained by: Family member    Pertinent History History of bilat TKA, still has pain with Left side.              [facility] PT Assessment - 08/01/19 0001      Ambulation/Gait   Gait Comments 10MWT:    0.4m/s average over 4 trials, no LOB, SPC used      Balance   Balance Assessed Yes      Standardized Balance Assessment   Standardized Balance Assessment Berg Balance Test      Berg Balance Test   Sit to Stand Able to stand  independently using hands    Standing Unsupported Able to stand safely 2 minutes    Sitting with Back Unsupported but Feet Supported on Floor or Stool Able to sit safely and securely 2 minutes    Stand to Sit Controls descent by using hands    Transfers Able to transfer  safely, minor use of hands    Standing Unsupported with Eyes Closed Able to stand 10 seconds with supervision    Standing Unsupported with Feet Together Able to place feet together independently and stand for 1 minute with supervision    From Standing, Reach Forward with Outstretched Arm Can reach confidently >25 cm (10")    From Standing Position, Pick up Object from [place] to pick up shoe safely and easily    From Standing Position, Turn to Look Behind Over each Shoulder Looks behind one side only/other side shows less weight shift    Turn 360 Degrees Able to turn 360 degrees safely but slowly    Standing Unsupported, Alternately Place Feet on Step/Stool Needs assistance to keep from falling or unable to try    Standing Unsupported, One Foot in Front Able to take small step independently and hold 30 seconds    Standing on One Leg Tries to lift leg/unable to hold 3 seconds but remains standing independently    Total Score 40    Berg comment: stepping was only apprehensive activity            INTERVENTION THIS DATE:   -NuStepSeat postiion seat level 6, arms 8, 5 minutes, monitored SPM, cues to keep over 60spm, inititally 6/10 Left hip pain, resolved at end -seated LAQ 1x15 bilat -seated marching 1x15 bilat  -seated LAQ 1x10 @ 2lb  10MWT (interval training) 4x: 0.15m/s, 0.19m/s, 0.23m/s (4TH UNKNOWN) ; *rests between each ~60 seconds -seated heel raises 1x15 bilat  *HEP instruction       PT Short Term Goals - 08/01/19 1449      PT SHORT TERM GOAL #1   Title After 4 weeks pt to demonstrate improved AMB tolerance >413ft c AD ad lib.    Baseline AMB ~289ft at eval    Time 4    Period Weeks    Status On-going    Target Date 08/27/19             PT Long Term Goals - 08/01/19 1449      PT LONG TERM GOAL #1   Title After 8 weeks pt to demonstrate improved score on FOTO survery >65 to improve independence in ADL, IADL.    Baseline FOTO: 50/100 at eval    Time 8    Period  Weeks    Status On-going    Target Date 09/24/19      PT LONG TERM GOAL #2   Title  Pt to demonstrate improved 5xSTS time in <13 to aid improved funcitonal strength/power for transfers.    Baseline >17sec at eval    Time 8    Period Weeks    Status On-going      PT LONG TERM GOAL #3   Title Pt to demonstrate imrpoved maximal gait speed AEB 10MWT> 0.58m/s to facilitate ability to mobilizae outside of the home.    Baseline on session 2: average 10MWT: 0.77m/s    Time 8    Period Weeks    Status New    Target Date 09/24/19                 Plan - 08/01/19 1443    Clinical Impression Statement Pt started on Nustep to target high velocity, large amplitude movements, as well as address joint stiffness in knees and ankle. Pt has full resolution of Left hip pain after nustep. No limiting DOE with stepping as with AMB last session, however exersion level is less- will attempt on level 1 or 2 next session. Trialed some exercises of basic BLE activation of large important joints, good tolerance ,added to HEP, handout provided. Also began interval trianing of short distance intervals of maximal speed walking training. Also performed BBT: 40/56 indicative of moderate falls risk. Pt a little tired at end of sesison.    Personal Factors and Comorbidities Age;Time since onset of injury/illness/exacerbation;Behavior Pattern    Examination-Activity Limitations Bend;Carry;Stand;Stairs;Lift;Transfers;Locomotion Level;Squat    Examination-Participation Restrictions Estate agent;Yard Work    Stability/Clinical Decision Making Stable/Uncomplicated    Clinical Decision Making Low    Rehab Potential Good    PT Frequency 2x / week    PT Duration 8 weeks    PT Treatment/Interventions Gait training;Therapeutic exercise;Therapeutic activities;Functional mobility training;Stair training;Balance training;Patient/family education;Moist Heat;Cryotherapy;Neuromuscular re-education;Passive  range of motion;Manual techniques;Ultrasound;ADLs/Self Care Home Management;Electrical Stimulation;DME Instruction;Cognitive remediation;Dry needling    PT Next Visit Plan Progress Nustep to level 1 or 2, Add 1-2 HEP items that are balance oriented, continue max speed AMB interval training.    PT Home Exercise Plan LAQ, Seated marching, seated ankle DF (issued at visit 2)    Consulted and Agree with Plan of Care Patient;Family member/caregiver    Family Member Consulted husband           Patient will benefit from skilled therapeutic intervention in order to improve the following deficits and impairments:  Abnormal gait, Decreased balance, Decreased endurance, Decreased mobility, Difficulty walking, Decreased activity tolerance, Decreased safety awareness, Decreased strength, Postural dysfunction  Visit Diagnosis: Muscle weakness (generalized)  Difficulty walking  Unsteadiness on feet     Problem List Patient Active Problem List   Diagnosis Date Noted  . 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 prolapse 10/24/2017  . Depression, recurrent ([place]) 10/24/2017  . 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 Dysuria 10/21/2013  . Nasal lesion 07/14/2013  . Fatigue 04/02/2013  . Breast cancer ([place]) 12/02/2012  . CAD (coronary artery disease) 09/15/2012  . Essential hypertension, benign 09/15/2012  . Hypercholesterolemia 09/15/2012  . Hypothyroidism 09/15/2012  . GERD (gastroesophageal reflux disease) 09/15/2012  . Diverticulosis 09/15/2012  . Chronic back pain 09/15/2012  . Hypokalemia 09/15/2012   2:57 PM, 08/01/19 Etta Grandchild, PT, DPT Physical Therapist - [place] [phone] (Office)    Lyonel Morejon C 08/01/2019, 2:53 PM  [facility] [address], [place], [address] Phone: [phone]   Fax:  [phone]  Name: Laurie Hale MRN: [id number] Date of Birth: Jul 05, 1940

## 2019-08-05 ENCOUNTER — Ambulatory Visit: Payer: Medicare Other

## 2019-08-05 ENCOUNTER — Other Ambulatory Visit: Payer: Self-pay

## 2019-08-05 DIAGNOSIS — R262 Difficulty in walking, not elsewhere classified: Secondary | ICD-10-CM

## 2019-08-05 DIAGNOSIS — R2681 Unsteadiness on feet: Secondary | ICD-10-CM

## 2019-08-05 DIAGNOSIS — M6281 Muscle weakness (generalized): Secondary | ICD-10-CM | POA: Diagnosis not present

## 2019-08-05 NOTE — Therapy (Addendum)
[facility] [place] [facility] [address], [place], [address] Phone: [phone]   Fax:  [phone]  Physical Therapy Treatment  Patient Details  Name: Laurie Hale MRN: [id number] Date of Birth: 1940/11/27 Referring Provider (PT): Juluis Pitch, MD   Encounter Date: 08/05/2019   PT End of Session - 08/05/19 1523    Visit Number 3    Number of Visits 16    Date for PT Re-Evaluation 09/24/19    Authorization Type MCR    Authorization Time Period 07/30/19-09/24/19    PT Start Time 1519    PT Stop Time 1558    PT Time Calculation (min) 39 min    Equipment Utilized During Treatment Gait belt    Activity Tolerance Patient tolerated treatment well;Patient limited by fatigue;No increased pain    Behavior During Therapy WFL for tasks assessed/performed           Past Medical History:  Diagnosis Date  . Arthritis   . 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 Cataract    immature bilateral  . Chronic back pain   . Constipation   . Coronary artery disease   . Diverticulosis   . GERD (gastroesophageal reflux disease)    takes a med daily-to bring with med name at surgery  . History of migraine    hasn't had one in yrs-per pt Zoloft stopped them  . Hyperlipidemia    takes Crestor and Niacin daily  . Hypertension    takes Amlodipine  and Lisinopril daily  . Hypokalemia    takes K Dur tid  . Hypothyroidism    takes Synthroid daily  . Insomnia    takes restoril prn  . Joint pain   . Joint swelling   . Peripheral edema    takes HCTZ daily  . Personal history of radiation therapy 2014   F/U lumpectomy   . Presence of pessary     Past Surgical History:  Procedure Laterality Date  . ABDOMINAL HYSTERECTOMY    . BACK SURGERY  2009  . BREAST BIOPSY Left 10/22/2012   Stereo - DCIS  . BREAST LUMPECTOMY Left 2014   DCIS. F/U radiation   . CARDIAC CATHETERIZATION  1999  . CATARACT EXTRACTION W/PHACO Right  10/05/2015   Procedure: CATARACT EXTRACTION PHACO AND INTRAOCULAR LENS PLACEMENT (IOC);  Surgeon: Ronnell Freshwater, MD;  Location: [facility];  Service: Ophthalmology;  Laterality: Right;  RIGHT  . CATARACT EXTRACTION W/PHACO Left 11/16/2015   Procedure: CATARACT EXTRACTION PHACO AND INTRAOCULAR LENS PLACEMENT (IOC);  Surgeon: Ronnell Freshwater, MD;  Location: [facility];  Service: Ophthalmology;  Laterality: Left;  LEFT  . COLONOSCOPY    . CORONARY ANGIOPLASTY     1 stent  . partial coloectomy  1995  . right knee arthroscopy  2013  . TONSILLECTOMY    . TOTAL KNEE ARTHROPLASTY  01/02/2012   RIGHT KNEE  . TOTAL KNEE ARTHROPLASTY  01/02/2012   Procedure: TOTAL KNEE ARTHROPLASTY;  Surgeon: Vickey Huger, MD;  Location: [facility];  Service: Orthopedics;  Laterality: Right;  . TOTAL KNEE ARTHROPLASTY Left 10/09/2017   Procedure: LEFT TOTAL KNEE ARTHROPLASTY;  Surgeon: Vickey Huger, MD;  Location: [facility];  Service: Orthopedics;  Laterality: Left;  with block    There were no vitals filed for this visit.   Subjective Assessment - 08/05/19 1522    Subjective Pt reports that shes been able to walk better and not  having to use her walker around the house.    Patient is accompained by: Family member    Pertinent History History of bilat TKA, still has pain with Left side.    Limitations Standing;Walking    How long can you sit comfortably? not limited    How long can you stand comfortably? 10 minutes    How long can you walk comfortably? 5-10 minutes    Patient Stated Goals to be able to walk without pain .     Currently in Pain? No/denies            INTERVENTION THIS DATE:   Therapeutic Exercise   -NuStepSeat postiion seat level 6, arms 8, 5 minutes, monitored SPM, cues to keep over 60spm - Amb. X67ft for endurance (268ft at eval) -Standing Hip Abduction with Bilat UE support x15 (tacile and verbal cueing needed to perform exercise correctly) -seated marching  1x15 bilat -seated LAQ 2x15 @ 3lb (progressed from 2lbs this session) -STS 18inch chair x10  -Figure 8's with SPC x6  -standing heel raises with UE bilat support  1x15 bilat   Performed exercises to improve functional strength     PT Education - 08/05/19 1523    Education Details Form/technique    Person(s) Educated Patient    Methods Explanation;Demonstration    Comprehension Verbalized understanding;Returned demonstration            PT Short Term Goals - 08/01/19 1449      PT SHORT TERM GOAL #1   Title After 4 weeks pt to demonstrate improved AMB tolerance >455ft c AD ad lib.    Baseline AMB ~22ft at eval    Time 4    Period Weeks    Status On-going    Target Date 08/27/19             PT Long Term Goals - 08/01/19 1449      PT LONG TERM GOAL #1   Title After 8 weeks pt to demonstrate improved score on FOTO survery >65 to improve independence in ADL, IADL.    Baseline FOTO: 50/100 at eval    Time 8    Period Weeks    Status On-going    Target Date 09/24/19      PT LONG TERM GOAL #2   Title Pt to demonstrate improved 5xSTS time in <13 to aid improved funcitonal strength/power for transfers.    Baseline >17sec at eval    Time 8    Period Weeks    Status On-going      PT LONG TERM GOAL #3   Title Pt to demonstrate imrpoved maximal gait speed AEB 10MWT> 0.24m/s to facilitate ability to mobilizae outside of the home.    Baseline on session 2: average 10MWT: 0.52m/s    Time 8    Period Weeks    Status New    Target Date 09/24/19                 Plan - 08/05/19 1525    Clinical Impression Statement Pt completed todays exercises and had moderate fatigue at the end of the session.  Pt required rest breaks between exercises (~60 seconds) to help decrease fatigue throughout session.  Pt ambulated ~353ft today with SPC which was an increase in distance showing some increase in endurance.  Pt continues to progress towards goals and will benefit in from  skilled PT to return to PLOF.   Personal Factors and Comorbidities Age;Time since onset of injury/illness/exacerbation;Behavior Pattern  Examination-Activity Limitations Bend;Carry;Stand;Stairs;Lift;Transfers;Locomotion Level;Squat    Examination-Participation Restrictions Estate agent;Yard Work    Stability/Clinical Decision Making Stable/Uncomplicated    Clinical Decision Making Low    Rehab Potential Good    PT Frequency 2x / week    PT Duration 8 weeks    PT Treatment/Interventions Gait training;Therapeutic exercise;Therapeutic activities;Functional mobility training;Stair training;Balance training;Patient/family education;Moist Heat;Cryotherapy;Neuromuscular re-education;Passive range of motion;Manual techniques;Ultrasound;ADLs/Self Care Home Management;Electrical Stimulation;DME Instruction;Cognitive remediation;Dry needling    PT Next Visit Plan Progress Nustep to level 1 or 2, Add 1-2 HEP items that are balance oriented, continue max speed AMB interval training.    PT Home Exercise Plan LAQ, Seated marching, seated ankle DF (issued at visit 2)    Consulted and Agree with Plan of Care Patient;Family member/caregiver    Family Member Consulted husband           Patient will benefit from skilled therapeutic intervention in order to improve the following deficits and impairments:  Abnormal gait, Decreased balance, Decreased endurance, Decreased mobility, Difficulty walking, Decreased activity tolerance, Decreased safety awareness, Decreased strength, Postural dysfunction  Visit Diagnosis: Muscle weakness (generalized)  Difficulty walking  Unsteadiness on feet     Problem List Patient Active Problem List   Diagnosis Date Noted  . 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 prolapse 10/24/2017  . Depression, recurrent ([facility]) 10/24/2017  . 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 Dysuria 10/21/2013  . Nasal lesion 07/14/2013  . Fatigue 04/02/2013  . Breast cancer ([facility]) 12/02/2012   . CAD (coronary artery disease) 09/15/2012  . Essential hypertension, benign 09/15/2012  . Hypercholesterolemia 09/15/2012  . Hypothyroidism 09/15/2012  . GERD (gastroesophageal reflux disease) 09/15/2012  . Diverticulosis 09/15/2012  . Chronic back pain 09/15/2012  . Hypokalemia 09/15/2012   4:10 PM, 08/05/19 Margarito Liner, SPT Student Physical Therapist [place]  [phone]  Margarito Liner, SPT 08/05/2019, 3:26 PM  [facility] [address] [place], [address] Phone: [phone]   Fax:  [phone]  Name: Laurie Hale MRN: [id number] Date of Birth: April 18, 1940

## 2019-08-07 ENCOUNTER — Ambulatory Visit: Payer: Medicare Other

## 2019-08-12 ENCOUNTER — Ambulatory Visit: Payer: Medicare Other

## 2019-08-14 ENCOUNTER — Ambulatory Visit: Payer: Medicare Other

## 2019-08-20 ENCOUNTER — Ambulatory Visit: Payer: Medicare Other

## 2019-08-22 ENCOUNTER — Ambulatory Visit: Payer: Medicare Other

## 2019-11-14 ENCOUNTER — Emergency Department
Admission: EM | Admit: 2019-11-14 | Discharge: 2019-11-14 | Disposition: A | Discharge status: Home / Self Care | Payer: Medicare Other

## 2019-11-14 NOTE — ED Notes (Signed)
Pt called x2 for triage.  No answer.

## 2019-11-14 NOTE — ED Notes (Signed)
Pt called for triage x1.  No response.

## 2019-11-14 NOTE — ED Notes (Signed)
Attempted to call at listed number, no answer. Unable to leave message. Pt checked for in Ed lobby, bathroom and outside with no answer despite multiple attemtps

## 2019-11-14 NOTE — ED Notes (Signed)
Called for triage X 3, no answer

## 2019-11-14 NOTE — ED Notes (Signed)
Called X 4, no answer.

## 2019-11-14 NOTE — ED Notes (Addendum)
Pt brought over by [facility] for headache and blurred vision since she woke up this morning.  Pt has h/o headaches and states she woke up around 10:00am.

## 2020-04-09 ENCOUNTER — Other Ambulatory Visit
Admission: RE | Admit: 2020-04-09 | Discharge: 2020-04-09 | Disposition: A | Discharge status: Home / Self Care | Payer: Medicare Other | Source: Ambulatory Visit | Attending: Family Medicine | Admitting: Family Medicine

## 2020-04-09 DIAGNOSIS — R0602 Shortness of breath: Secondary | ICD-10-CM | POA: Insufficient documentation

## 2020-04-09 LAB — BRAIN NATRIURETIC PEPTIDE: B Natriuretic Peptide: 29.2 pg/mL (ref 0.0–100.0)

## 2020-10-13 ENCOUNTER — Ambulatory Visit: Payer: Medicare Other | Attending: Family Medicine | Admitting: Physical Therapy

## 2020-10-21 ENCOUNTER — Ambulatory Visit: Payer: Medicare Other | Admitting: Physical Therapy

## 2020-10-28 ENCOUNTER — Ambulatory Visit: Payer: Medicare Other | Admitting: Physical Therapy

## 2020-11-03 ENCOUNTER — Ambulatory Visit: Payer: Medicare Other | Admitting: Physical Therapy

## 2020-11-11 ENCOUNTER — Ambulatory Visit: Payer: Medicare Other | Admitting: Physical Therapy

## 2020-11-17 ENCOUNTER — Ambulatory Visit: Payer: Medicare Other | Admitting: Physical Therapy

## 2020-11-19 ENCOUNTER — Ambulatory Visit: Payer: Medicare Other | Admitting: Physical Therapy

## 2020-11-23 ENCOUNTER — Ambulatory Visit: Payer: Medicare Other | Admitting: Physical Therapy

## 2020-11-26 ENCOUNTER — Ambulatory Visit: Payer: Medicare Other | Admitting: Physical Therapy

## 2020-11-30 ENCOUNTER — Ambulatory Visit: Payer: Medicare Other | Admitting: Physical Therapy

## 2020-12-02 ENCOUNTER — Ambulatory Visit: Payer: Medicare Other | Admitting: Physical Therapy

## 2020-12-07 ENCOUNTER — Ambulatory Visit: Payer: Medicare Other | Admitting: Physical Therapy

## 2020-12-09 ENCOUNTER — Ambulatory Visit: Payer: Medicare Other | Admitting: Physical Therapy

## 2020-12-14 ENCOUNTER — Ambulatory Visit: Payer: Medicare Other | Admitting: Physical Therapy

## 2020-12-16 ENCOUNTER — Ambulatory Visit: Payer: Medicare Other | Admitting: Physical Therapy

## 2020-12-21 ENCOUNTER — Ambulatory Visit: Payer: Medicare Other | Admitting: Physical Therapy

## 2020-12-23 ENCOUNTER — Ambulatory Visit: Payer: Medicare Other | Admitting: Physical Therapy

## 2020-12-24 ENCOUNTER — Other Ambulatory Visit: Payer: Self-pay

## 2020-12-24 ENCOUNTER — Encounter: Payer: Self-pay | Admitting: Physical Therapy

## 2020-12-24 ENCOUNTER — Ambulatory Visit: Payer: Medicare Other | Attending: Family Medicine | Admitting: Physical Therapy

## 2020-12-24 DIAGNOSIS — M6281 Muscle weakness (generalized): Secondary | ICD-10-CM | POA: Diagnosis not present

## 2020-12-24 DIAGNOSIS — R262 Difficulty in walking, not elsewhere classified: Secondary | ICD-10-CM | POA: Diagnosis present

## 2020-12-24 DIAGNOSIS — R269 Unspecified abnormalities of gait and mobility: Secondary | ICD-10-CM | POA: Insufficient documentation

## 2020-12-24 DIAGNOSIS — R2681 Unsteadiness on feet: Secondary | ICD-10-CM | POA: Diagnosis present

## 2020-12-24 NOTE — Patient Instructions (Signed)
Access Code: [email or web address] URL: [email or web address] Date: 12/24/2020 Prepared by: Blanche East  Exercises Seated Long Arc Quad - 2-3 x daily - 7 x weekly - 1 sets - 10 reps - 5 sec hold

## 2020-12-24 NOTE — Therapy (Signed)
[place] [facility] SERVICES [address] [place], [place], [address] Phone: [phone]   Fax:  [phone]  Physical Therapy Evaluation  Patient Details  Name: Laurie Hale MRN: [id number] Date of Birth: Sep 20, 1940 Referring Provider (PT): Dr. 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, DCIS   Cataract    immature bilateral   Chronic back pain    Constipation    Coronary artery disease    Diverticulosis    GERD (gastroesophageal reflux disease)    takes a med daily-to bring with med name at surgery   History of migraine    hasn't had one in yrs-per pt Zoloft stopped them   Hyperlipidemia    takes Crestor and Niacin daily   Hypertension    takes Amlodipine  and Lisinopril daily   Hypokalemia    takes K Dur tid   Hypothyroidism    takes Synthroid daily   Insomnia    takes restoril prn   Joint pain    Joint swelling    Peripheral edema    takes HCTZ daily   Personal history of radiation therapy 2014   F/U lumpectomy    Presence of pessary     Past Surgical History:  Procedure Laterality Date   ABDOMINAL HYSTERECTOMY     BACK SURGERY  2009   BREAST BIOPSY Left 10/22/2012   Stereo - DCIS   BREAST LUMPECTOMY Left 2014   DCIS. F/U radiation    CARDIAC CATHETERIZATION  1999   CATARACT EXTRACTION W/PHACO Right 10/05/2015    Procedure: CATARACT EXTRACTION PHACO AND INTRAOCULAR LENS PLACEMENT (IOC);  Surgeon: Ronnell Freshwater, MD;  Location: [facility];  Service: Ophthalmology;  Laterality: Right;  RIGHT   CATARACT EXTRACTION W/PHACO Left 11/16/2015   Procedure: CATARACT EXTRACTION PHACO AND INTRAOCULAR LENS PLACEMENT (IOC);  Surgeon: Ronnell Freshwater, MD;  Location: [facility];  Service: Ophthalmology;  Laterality: Left;  LEFT   COLONOSCOPY     CORONARY ANGIOPLASTY     1 stent   partial coloectomy  1995   right knee arthroscopy  2013   TONSILLECTOMY     TOTAL KNEE ARTHROPLASTY  01/02/2012   RIGHT KNEE   TOTAL KNEE ARTHROPLASTY  01/02/2012   Procedure: TOTAL KNEE ARTHROPLASTY;  Surgeon: Vickey Huger, MD;  Location: [facility];  Service: Orthopedics;  Laterality: Right;   TOTAL KNEE ARTHROPLASTY Left 10/09/2017   Procedure: LEFT TOTAL KNEE ARTHROPLASTY;  Surgeon: Vickey Huger, MD;  [facility];  Service: Orthopedics;  Laterality: Left;  with block    There were no vitals filed for this visit.    Subjective Assessment - 12/24/20 1434     Subjective "I want to get rid  of my rollator"    Patient is accompained by: --   friend, [name]   Pertinent History 80 yo Female presents to therapy with SPC, but states she uses a rollator at home. She reports a general decline in mobility in the last year and is wanting to get rid of the walker. She denies any recent falls. She reports her legs have been getting weaker in the last year. She denies any numbness/tingling; Pt lives at home, her son lives with her. She currently cleans her home and does some cooking. She does report taking a longer time to complete tasks. She also requires increased rest breaks due to fatigue; In addition to mobility issues, she does report some cognitive change with impaired memory; This has been going on for about a year. She reports occasional forgetting to do something but otherwise reports independence in cognition.  No difficulty managing medication or balancing checkbook;    Limitations Standing;Walking    How long can you sit comfortably? Not limited;    How long can you stand comfortably? 5 min;    How long can you walk comfortably? able to walk as far as needed with RW; hasn't used the cane a lot; She is able to walk short distances holding onto furniture without AD    Patient Stated Goals "Get rid of the walker, get stronger."    Currently in Pain? No/denies                [facility] PT Assessment - 12/24/20 0001       Assessment   Medical Diagnosis Weakness/cognitive change    Referring Provider (PT) Dr. Lovie Macadamia    Onset Date/Surgical Date --   over the last year   Hand Dominance Right    Next MD Visit --   none scheduled   Prior Therapy OPPT for TKA 2019 ; had outpatient PT: Sept-Oct 2019 for weakness with good results      Precautions   Precautions Fall    Required Braces or Orthoses --   none     Restrictions   Weight Bearing Restrictions No      Balance Screen   Has the patient fallen in the past 6 months No    How many times? 0    Has the patient had a decrease in activity level because of a fear of falling?  No    Is the patient reluctant to leave their home because of a fear of falling?  No      Home Social worker Private residence    Living Arrangements Spouse/significant other;Children    Available Help at Discharge Family    Type of [place] to enter    Entrance Stairs-Number of Steps 1    [place] One level    [place] - 4 wheels;Cane - single point      Prior Function   Level of Independence Independent with basic ADLs;Independent with household mobility with device    Vocation Retired    Leisure likes to [employer], work on Teaching laboratory technician, likes to be outside; likes to do [employer]   Overall Cognitive Status Within Functional Limits for tasks assessed       Observation/Other Assessments   Observations very nice woman    Focus on Therapeutic Outcomes (FOTO)  56%      Sensation   Light Touch Appears Intact  Proprioception Appears Intact      Coordination   Gross Motor Movements are Fluid and Coordinated Yes    Fine Motor Movements are Fluid and Coordinated Yes    Finger Nose Finger Test accurate bilaterally    Heel Shin Test accurate bilaterally      Posture/Postural Control   Posture Comments sits with slumped posture, difficulty achieving erect posture with increased posterior lean; denies increase in back pain      ROM / Strength   AROM / PROM / Strength AROM      AROM   Overall AROM Comments BUE and BLE are WFL, does report tightness with knee extension with increased posterior trunk lean due to tightness in hamstrings      Strength   Overall Strength Comments BUE grossly 4/5    Right Hip Flexion 4/5    Right Hip ABduction 4/5    Right Hip ADduction 4/5    Left Hip Flexion 4/5    Left Hip ABduction 4/5    Left Hip ADduction 4/5    Right Knee Flexion 4-/5    Right Knee Extension 3+/5    Left Knee Flexion 4-/5    Left Knee Extension 3+/5    Right Ankle Dorsiflexion 3+/5    Left Ankle Dorsiflexion 3+/5      Transfers   Comments pushes up on legs to get up out of chair, requires CGA without AD      Ambulation/Gait   Gait Comments ambulates with rollator, flexed posture, reciprocal gait pattern, slower gait speed, decreased heel strike      Standardized Balance Assessment   Standardized Balance Assessment Timed Up and Go Test;Five Times Sit to Stand;10 meter walk test    Five times sit to stand comments  24.79 with arms on legs, CGA for safety, >15 sec indicates high risk for falls    10 Meter Walk 0.58 m/s with rollator;      Timed Up and Go Test   Normal TUG (seconds) 24.25    TUG Comments with rollator, >15 sec indicates high risk for falls      High Level Balance   High Level Balance Comments Stand unsupported,  mild instability, close supervision; dynamic balance is poor requiring AD for safety                        Objective measurements completed on examination: See above findings.    Initiated HEP with seated LAQ, see patient instructions            PT Education - 12/24/20 1540     Education Details plan of care    Person(s) Educated Patient    Methods Explanation    Comprehension Verbalized understanding              PT Short Term Goals - 12/24/20 1545       PT SHORT TERM GOAL #1   Title Patient will be adherent to HEP at least 3x a week to improve functional strength and balance for better safety at home.    Time 4    Period Weeks    Status New    Target Date 01/21/21      PT SHORT TERM GOAL #2   Title Patient (> 22 years old) will complete five times sit to stand test in < 15 seconds indicating an increased LE strength and improved balance.    Time 4    Period Weeks    Status  New    Target Date 01/21/21               PT Long Term Goals - 12/24/20 1546       PT LONG TERM GOAL #1   Title After 8 weeks pt to demonstrate improved score on FOTO survery >65 to improve independence in ADL, IADL.    Time 6    Period Weeks    Status New    Target Date 02/04/21      PT LONG TERM GOAL #2   Title Patient will increase six minute walk test distance to >1000 for progression to community ambulator and improve gait ability    Time 6    Period Weeks    Status New    Target Date 02/04/21      PT LONG TERM GOAL #3   Title Patient will increase 10 meter walk test to >0.8 m/s without AD as to improve gait speed for better home ambulation and to reduce fall risk.    Time 6    Period Weeks    Status New    Target Date 02/04/21      PT LONG TERM GOAL #4   Title Patient will increase BLE gross strength to 4+/5 as to improve functional strength for independent gait, increased standing tolerance and increased ADL ability.    Time 6    Period Weeks     Status New    Target Date 02/04/21      PT LONG TERM GOAL #5   Title Patient will reduce timed up and go to <15 seconds to reduce fall risk and demonstrate improved transfer/gait ability.    Time 6    Period Weeks    Status New    Target Date 02/04/21                    Plan - 12/24/20 1540     Clinical Impression Statement 80 yo Female presents to therapy with general weakness and decreased mobility. She has been using a rollator for last year and is wanting to get back to walking independent without it. Patient does exhibit BLE weakness especially in knee. She also exhibits trunk weakness and impaired posture with flexed positioning in sitting and standing. She has a history of chronic back pain but denies any pain during PT evaluation. She has difficulty achieving erect posture often leaning backwards when instructed to correct posture. Patient does test as an increased risk for falls with slower gait speed, increased difficulty with sit<>Stand transfers and slower timed up and go. She would benefit from skilled PT Intervention to improve LE strength and increase mobility and gait safety;    Personal Factors and Comorbidities Comorbidity 3+;Fitness;Past/Current Experience;Time since onset of injury/illness/exacerbation    Comorbidities PMH significant: HTN (controlled), hyperlipidemia, anxiety/depression, cataracts, insomnia (averages 6 hours of sleep a night), GERD, arthritis with history of knee replacement    Examination-Activity Limitations Bend;Carry;Lift;Locomotion Level;Squat;Stairs;Stand;Transfers    Examination-Participation Restrictions Community Activity;Cleaning;Laundry;Yard Work;Shop;Volunteer;Church    Stability/Clinical Decision Making Stable/Uncomplicated    Clinical Decision Making Low    Rehab Potential Good    PT Frequency 2x / week    PT Duration 6 weeks    PT Treatment/Interventions Gait training;Therapeutic exercise;Therapeutic activities;Functional  mobility training;Stair training;Balance training;Patient/family education;Moist Heat;Cryotherapy;Neuromuscular re-education;Passive range of motion;Manual techniques;Ultrasound;ADLs/Self Care Home Management;Electrical Stimulation;DME Instruction;Cognitive remediation;Dry needling;Energy conservation    PT Next Visit Plan Advance HEP, further assess balance, consider 6 min walk test    PT  Home Exercise Plan initiated with LAQ    Consulted and Agree with Plan of Care Patient             Patient will benefit from skilled therapeutic intervention in order to improve the following deficits and impairments:  Abnormal gait, Decreased balance, Decreased endurance, Decreased mobility, Difficulty walking, Decreased activity tolerance, Decreased safety awareness, Decreased strength, Postural dysfunction  Visit Diagnosis: Muscle weakness (generalized)  Difficulty in walking, not elsewhere classified     Problem List Patient Active Problem List   Diagnosis Date Noted   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 prolapse 10/24/2017   Depression, recurrent ([place]) 10/24/2017   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   Dysuria 10/21/2013   Nasal lesion 07/14/2013   Fatigue 04/02/2013   Breast cancer ([place]) 12/02/2012   CAD (coronary artery disease) 09/15/2012   Essential hypertension, benign 09/15/2012   Hypercholesterolemia 09/15/2012   Hypothyroidism 09/15/2012   GERD (gastroesophageal reflux disease) 09/15/2012   Diverticulosis 09/15/2012   Chronic back pain 09/15/2012   Hypokalemia 09/15/2012    Chariah Bailey, PT, DPT 12/24/2020, 3:50 PM  [place] [facility] SERVICES [place], [place], [address] Phone: [phone]   Fax:  [phone]  Name: Laurie Hale MRN: [id number] Date of Birth: 1940-05-26

## 2020-12-28 ENCOUNTER — Ambulatory Visit: Payer: Medicare Other | Admitting: Physical Therapy

## 2020-12-29 ENCOUNTER — Ambulatory Visit: Payer: Medicare Other

## 2020-12-29 ENCOUNTER — Other Ambulatory Visit: Payer: Self-pay

## 2020-12-29 DIAGNOSIS — R262 Difficulty in walking, not elsewhere classified: Secondary | ICD-10-CM

## 2020-12-29 DIAGNOSIS — R2681 Unsteadiness on feet: Secondary | ICD-10-CM

## 2020-12-29 DIAGNOSIS — M6281 Muscle weakness (generalized): Secondary | ICD-10-CM | POA: Diagnosis not present

## 2020-12-29 DIAGNOSIS — R269 Unspecified abnormalities of gait and mobility: Secondary | ICD-10-CM

## 2020-12-29 NOTE — Therapy (Signed)
[place] MAIN [facility] SERVICES [address] [place], [place], [address] Phone: [phone]   Fax:  [phone]  Physical Therapy Treatment  Patient Details  Name: Laurie Hale MRN: [id number] Date of Birth: Aug 01, 1940 Referring Provider (PT): Dr. 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, DCIS   Cataract    immature bilateral   Chronic back pain    Constipation    Coronary artery disease    Diverticulosis    GERD (gastroesophageal reflux disease)    takes a med daily-to bring with med name at surgery   History of migraine    hasn't had one in yrs-per pt Zoloft stopped them   Hyperlipidemia    takes Crestor and Niacin daily   Hypertension    takes Amlodipine  and Lisinopril daily   Hypokalemia    takes K Dur tid   Hypothyroidism    takes Synthroid daily   Insomnia    takes restoril prn   Joint pain    Joint swelling    Peripheral edema    takes HCTZ daily   Personal history of radiation therapy 2014   F/U lumpectomy    Presence of pessary     Past Surgical History:  Procedure Laterality Date   ABDOMINAL HYSTERECTOMY     BACK SURGERY  2009   BREAST BIOPSY Left 10/22/2012   Stereo - DCIS   BREAST LUMPECTOMY Left 2014   DCIS. F/U radiation    CARDIAC CATHETERIZATION  1999   CATARACT EXTRACTION W/PHACO Right 10/05/2015   Procedure:  CATARACT EXTRACTION PHACO AND INTRAOCULAR LENS PLACEMENT (IOC);  Surgeon: Ronnell Freshwater, MD;  Location: [place];  Service: Ophthalmology;  Laterality: Right;  RIGHT   CATARACT EXTRACTION W/PHACO Left 11/16/2015   Procedure: CATARACT EXTRACTION PHACO AND INTRAOCULAR LENS PLACEMENT (IOC);  Surgeon: Ronnell Freshwater, MD;  Location: [place];  Service: Ophthalmology;  Laterality: Left;  LEFT   COLONOSCOPY     CORONARY ANGIOPLASTY     1 stent   partial coloectomy  1995   right knee arthroscopy  2013   TONSILLECTOMY     TOTAL KNEE ARTHROPLASTY  01/02/2012   RIGHT KNEE   TOTAL KNEE ARTHROPLASTY  01/02/2012   Procedure: TOTAL KNEE ARTHROPLASTY;  Surgeon: Vickey Huger, MD;  Location: [place];  Service: Orthopedics;  Laterality: Right;   TOTAL KNEE ARTHROPLASTY Left 10/09/2017   Procedure: LEFT TOTAL KNEE ARTHROPLASTY;  Surgeon: Vickey Huger, MD;  Location: [facility];  Service: Orthopedics;  Laterality: Left;  with block    There were no vitals filed for this visit.   Subjective Assessment - 12/29/20 1324     Subjective Patient reports feeling tired today but  doing okay and wants to be stronger.    Patient is accompained by: --   friend, Laurie Hale   Pertinent History 80 yo Female presents to therapy with SPC, but states she uses a rollator at home. She reports a general decline in mobility in the last year and is wanting to get rid of the walker. She denies any recent falls. She reports her legs have been getting weaker in the last year. She denies any numbness/tingling; Pt lives at home, her son lives with her. She currently cleans her home and does some cooking. She does report taking a longer time to complete tasks. She also requires increased rest breaks due to fatigue; In addition to mobility issues, she does report some cognitive change with impaired memory; This has been going on for about a year. She reports occasional forgetting to do something but otherwise  reports independence in cognition. No difficulty managing medication or balancing checkbook;    Limitations Standing;Walking    How long can you sit comfortably? Not limited;    How long can you stand comfortably? 5 min;    How long can you walk comfortably? able to walk as far as needed with RW; hasn't used the cane a lot; She is able to walk short distances holding onto furniture without AD    Patient Stated Goals "Get rid of the walker, get stronger."    Currently in Pain? No/denies            *Treatment abbreviated due pt arriving late.   INTERVENTIONS:   Nustep for cardiovascular endurance and ROM for BLE  L0 (LE only ) seat level 6 - 5 min total - Patient reports as "medium"  Standing LE strengthening:  -hip march -hip abd  -hip ext  - Knee flex - Mini squat -calf raises -Toe raises 12 reps BLE. Education provided throughout session via VC/TC and demonstration to facilitate movement at target joints and correct muscle activation for all testing and exercises performed.  Patient denied pain during session.         Access Code: [email or web address] URL: [email or web address] Date: 12/29/2020 Prepared by: Sande Brothers  Exercises Standing Hip Abduction with Counter Support - 1 x daily - 7 x weekly - 3 sets - 10 reps - 2 hold Standing Hip Extension with Counter Support - 1 x daily - 7 x weekly - 3 sets - 10 reps - 2 hold Standing Knee Flexion with Counter Support - 1 x daily - 7 x weekly - 3 sets - 10 reps - 2 hold Standing March with Counter Support - 1 x daily - 7 x weekly - 3 sets - 10 reps - 2 hold Mini Squat with Counter Support - 1 x daily - 7 x weekly - 3 sets - 10 reps - 2 hold Heel Raises with Counter Support - 1 x daily - 7 x weekly - 3 sets - 10 reps - 2 hold Heel Toe Raises with Counter Support - 1 x daily - 7 x weekly - 3 sets - 10 reps - 2 hold     Clinical Impression: Treatment was limited secondary to patient with late arrival. She  presented with good motivation and able to perform well on Nustep and later with instruction in standing LE strengthening. She denied any pain today and able to complete 12 reps with fatigue as only limiting factor requiring brief rest break. She would benefit from skilled PT Interventions to improve LE strength and increase mobility and gait safety  for improved quality of life and decreased risk of falling.               PT Education - 12/29/20 1324     Education Details exercise technique    Person(s) Educated Patient    Methods Explanation;Demonstration;Tactile cues;Verbal cues    Comprehension Verbalized understanding;Verbal cues required;Need further instruction;Returned demonstration;Tactile cues required              PT Short Term Goals - 12/24/20 1545       PT SHORT TERM GOAL #1   Title Patient will be adherent to HEP at least 3x a week to improve functional strength and balance for better safety at home.    Time 4    Period Weeks    Status New    Target Date 01/21/21      PT SHORT TERM GOAL #2   Title Patient (> 76 years old) will complete five times sit to stand test in < 15 seconds indicating an increased LE strength and improved balance.    Time 4    Period Weeks    Status New    Target Date 01/21/21               PT Long Term Goals - 12/24/20 1546       PT LONG TERM GOAL #1   Title After 8 weeks pt to demonstrate improved score on FOTO survery >65 to improve independence in ADL, IADL.    Time 6    Period Weeks    Status New    Target Date 02/04/21      PT LONG TERM GOAL #2   Title Patient will increase six minute walk test distance to >1000 for progression to community ambulator and improve gait ability    Time 6    Period Weeks    Status New    Target Date 02/04/21      PT LONG TERM GOAL #3   Title Patient will increase 10 meter walk test to >0.8 m/s without AD as to improve gait speed for better home ambulation and to reduce fall  risk.    Time 6    Period Weeks    Status New    Target Date 02/04/21      PT LONG TERM GOAL #4   Title Patient will increase BLE gross strength to 4+/5 as to improve functional strength for independent gait, increased standing tolerance and increased ADL ability.    Time 6    Period Weeks    Status New    Target Date 02/04/21      PT LONG TERM GOAL #5   Title Patient will reduce timed up and go to <15 seconds to reduce fall risk and demonstrate improved transfer/gait ability.    Time 6    Period Weeks    Status New    Target Date 02/04/21                   Plan - 12/29/20 1118     Clinical Impression Statement Treatment was limited secondary to patient with late arrival. She presented with good motivation and able to perform well on Nustep and later with instruction in standing LE strengthening. She denied any pain today and able to complete 12 reps with fatigue as only limiting factor requiring brief rest break. She would benefit from skilled PT Interventions to improve LE strength and increase mobility and gait safety for improved quality of life and decreased  risk of falling.    Personal Factors and Comorbidities Comorbidity 3+;Fitness;Past/Current Experience;Time since onset of injury/illness/exacerbation    Comorbidities PMH significant: HTN (controlled), hyperlipidemia, anxiety/depression, cataracts, insomnia (averages 6 hours of sleep a night), GERD, arthritis with history of knee replacement    Examination-Activity Limitations Bend;Carry;Lift;Locomotion Level;Squat;Stairs;Stand;Transfers    Examination-Participation Restrictions Community Activity;Cleaning;Laundry;Yard Work;Shop;Volunteer;Church    Stability/Clinical Decision Making Stable/Uncomplicated    Rehab Potential Good    PT Frequency 2x / week    PT Duration 6 weeks    PT Treatment/Interventions Gait training;Therapeutic exercise;Therapeutic activities;Functional mobility training;Stair training;Balance  training;Patient/family education;Moist Heat;Cryotherapy;Neuromuscular re-education;Passive range of motion;Manual techniques;Ultrasound;ADLs/Self Care Home Management;Electrical Stimulation;DME Instruction;Cognitive remediation;Dry needling;Energy conservation    PT Next Visit Plan Advance HEP, further assess balance, consider 6 min walk test    PT Home Exercise Plan 12/29/2020=Access Code: [email or web address]    Consulted and Agree with Plan of Care Patient             Patient will benefit from skilled therapeutic intervention in order to improve the following deficits and impairments:  Abnormal gait, Decreased balance, Decreased endurance, Decreased mobility, Difficulty walking, Decreased activity tolerance, Decreased safety awareness, Decreased strength, Postural dysfunction  Visit Diagnosis: Abnormality of gait and mobility  Difficulty in walking, not elsewhere classified  Muscle weakness (generalized)  Unsteadiness on feet     Problem List Patient Active Problem List   Diagnosis Date Noted   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 prolapse 10/24/2017   Depression, recurrent ([place]) 10/24/2017   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 [place], [place], [address] Phone: [phone]   Fax:  [phone]  Name: ZANIA KALISZ MRN: [id number] Date of Birth: 1940-06-13

## 2020-12-30 ENCOUNTER — Ambulatory Visit: Payer: Medicare Other | Admitting: Physical Therapy

## 2020-12-31 ENCOUNTER — Other Ambulatory Visit: Payer: Self-pay

## 2020-12-31 ENCOUNTER — Ambulatory Visit: Payer: Medicare Other

## 2020-12-31 DIAGNOSIS — R262 Difficulty in walking, not elsewhere classified: Secondary | ICD-10-CM

## 2020-12-31 DIAGNOSIS — R269 Unspecified abnormalities of gait and mobility: Secondary | ICD-10-CM

## 2020-12-31 DIAGNOSIS — R2681 Unsteadiness on feet: Secondary | ICD-10-CM

## 2020-12-31 DIAGNOSIS — M6281 Muscle weakness (generalized): Secondary | ICD-10-CM

## 2020-12-31 NOTE — Therapy (Signed)
[place] MAIN [facility] SERVICES [address] [place], [place], [address] Phone: [phone]   Fax:  [phone]  Physical Therapy Treatment  Patient Details  Name: Laurie Hale MRN: [id number] Date of Birth: 02/21/1940 Referring Provider (PT): Dr. 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, DCIS   Cataract    immature bilateral   Chronic back pain    Constipation    Coronary artery disease    Diverticulosis    GERD (gastroesophageal reflux disease)    takes a med daily-to bring with med name at surgery   History of migraine    hasn't had one in yrs-per pt Zoloft stopped them   Hyperlipidemia    takes Crestor and Niacin daily   Hypertension    takes Amlodipine  and Lisinopril daily   Hypokalemia    takes K Dur tid   Hypothyroidism    takes Synthroid daily   Insomnia    takes restoril prn   Joint pain    Joint swelling    Peripheral edema    takes HCTZ daily   Personal history of radiation therapy 2014   F/U lumpectomy    Presence of pessary     Past Surgical History:  Procedure Laterality Date   ABDOMINAL HYSTERECTOMY     BACK SURGERY  2009   BREAST BIOPSY Left 10/22/2012   Stereo - DCIS   BREAST LUMPECTOMY Left 2014   DCIS. F/U radiation    CARDIAC CATHETERIZATION  1999   CATARACT EXTRACTION W/PHACO Right 10/05/2015   Procedure:  CATARACT EXTRACTION PHACO AND INTRAOCULAR LENS PLACEMENT (IOC);  Surgeon: Ronnell Freshwater, MD;  Location: [place];  Service: Ophthalmology;  Laterality: Right;  RIGHT   CATARACT EXTRACTION W/PHACO Left 11/16/2015   Procedure: CATARACT EXTRACTION PHACO AND INTRAOCULAR LENS PLACEMENT (IOC);  Surgeon: Ronnell Freshwater, MD;  Location: [place];  Service: Ophthalmology;  Laterality: Left;  LEFT   COLONOSCOPY     CORONARY ANGIOPLASTY     1 stent   partial coloectomy  1995   right knee arthroscopy  2013   TONSILLECTOMY     TOTAL KNEE ARTHROPLASTY  01/02/2012   RIGHT KNEE   TOTAL KNEE ARTHROPLASTY  01/02/2012   Procedure: TOTAL KNEE ARTHROPLASTY;  Surgeon: Vickey Huger, MD;  Location: [place];  Service: Orthopedics;  Laterality: Right;   TOTAL KNEE ARTHROPLASTY Left 10/09/2017   Procedure: LEFT TOTAL KNEE ARTHROPLASTY;  Surgeon: Vickey Huger, MD;  [facility];  Service: Orthopedics;  Laterality: Left;  with block    There were no vitals filed for this visit.   Subjective Assessment - 12/31/20 1312     Subjective Patient reports feeling cold today and  did have some sorenss after last visit but overall doing well.    Patient is accompained by: --   friend, [name]   Pertinent History 80 yo Female presents to therapy with SPC, but states she uses a rollator at home. She reports a general decline in mobility in the last year and is wanting to get rid of the walker. She denies any recent falls. She reports her legs have been getting weaker in the last year. She denies any numbness/tingling; Pt lives at home, her son lives with her. She currently cleans her home and does some cooking. She does report taking a longer time to complete tasks. She also requires increased rest breaks due to fatigue; In addition to mobility issues, she does report some cognitive change with impaired memory; This has been going on for about a year. She reports occasional forgetting to do  something but otherwise reports independence in cognition. No difficulty managing medication or balancing checkbook;    Limitations Standing;Walking    How long can you sit comfortably? Not limited;    How long can you stand comfortably? 5 min;    How long can you walk comfortably? able to walk as far as needed with RW; hasn't used the cane a lot; She is able to walk short distances holding onto furniture without AD    Patient Stated Goals "Get rid of the walker, get stronger."    Currently in Pain? No/denies              INTERVENTIONS:   Therapeutic Exercises:   Nustep L0 (LE only) for 6 min at 0.25 mi (focusing on keeping SPM >60 SPM)   Seated LE Strengthening:  -ham curl with GTB 2 sets of 12 reps (Patient reports as - Easy) -Hip march with 3# ankle weights 2 sets of 12 reps (Patient rates as medium) -knee ext with (ball squeeze) with 3# ankle weights 2 sets of 12 reps (patient reports as medium)  Standing LE strengthening:  - stand hip ext x 12 reps BLE (VC for correct technique)  -Step up and over 1/2 foam roll x 15 reps BLE- (Patient rates as Hard) -Sit to stand without UE support x 10 reps (patient rates as very hard) - Required visual demo to lean forward (nose over toes) Education provided throughout session via VC/TC and demonstration to facilitate movement at target joints and correct muscle activation for all testing and exercises performed.   Clinical Impression: Patient presented with good motivation today and receptive to all cueing for safe interventions. She was able to perform multiple sets of seated and standing LE strengthening and demonstrated fatigue as limiting factor- denying any pain. She would benefit from skilled PT Interventions to improve LE strength and increase mobility and gait safety for improved quality of life and decreased risk of falling                       PT Education - 12/31/20 1315     Education Details Exercise  technique    Person(s) Educated Patient    Methods Explanation;Demonstration;Tactile cues;Verbal cues    Comprehension Verbalized understanding;Returned demonstration;Verbal cues required;Need further instruction              PT Short Term Goals - 12/24/20 1545       PT SHORT TERM GOAL #1   Title Patient will be adherent to HEP at least 3x a week to improve functional strength and balance for better safety at  home.    Time 4    Period Weeks    Status New    Target Date 01/21/21      PT SHORT TERM GOAL #2   Title Patient (> 68 years old) will complete five times sit to stand test in < 15 seconds indicating an increased LE strength and improved balance.    Time 4    Period Weeks    Status New    Target Date 01/21/21               PT Long Term Goals - 12/24/20 1546       PT LONG TERM GOAL #1   Title After 8 weeks pt to demonstrate improved score on FOTO survery >65 to improve independence in ADL, IADL.    Time 6    Period Weeks    Status New    Target Date 02/04/21      PT LONG TERM GOAL #2   Title Patient will increase six minute walk test distance to >1000 for progression to community ambulator and improve gait ability    Time 6    Period Weeks    Status New    Target Date 02/04/21      PT LONG TERM GOAL #3   Title Patient will increase 10 meter walk test to >0.8 m/s without AD as to improve gait speed for better home ambulation and to reduce fall risk.    Time 6    Period Weeks    Status New    Target Date 02/04/21      PT LONG TERM GOAL #4   Title Patient will increase BLE gross strength to 4+/5 as to improve functional strength for independent gait, increased standing tolerance and increased ADL ability.    Time 6    Period Weeks    Status New    Target Date 02/04/21      PT LONG TERM GOAL #5   Title Patient will reduce timed up and go to <15 seconds to reduce fall risk and demonstrate improved transfer/gait ability.    Time 6    Period Weeks     Status New    Target Date 02/04/21                   Plan - 12/31/20 1317     Clinical Impression Statement Patient presented with good motivation today and receptive to all cueing for safe interventions. She was able to perform multiple sets of seated and standing LE strengthening and demonstrated fatigue as limiting factor- denying any pain. She would benefit from skilled PT Interventions to improve LE strength and increase mobility and gait safety for improved quality of life and decreased risk of falling    Personal Factors and Comorbidities Comorbidity 3+;Fitness;Past/Current Experience;Time since onset of injury/illness/exacerbation    Comorbidities PMH significant: HTN (controlled), hyperlipidemia, anxiety/depression, cataracts, insomnia (averages 6 hours of sleep a night), GERD, arthritis with history of knee replacement    Examination-Activity Limitations Bend;Carry;Lift;Locomotion Level;Squat;Stairs;Stand;Transfers    Examination-Participation Restrictions Community Activity;Cleaning;Laundry;Yard Work;Shop;Volunteer;Church    Stability/Clinical Decision Making Stable/Uncomplicated    Rehab Potential Good    PT Frequency 2x / week    PT Duration 6 weeks    PT Treatment/Interventions Gait training;Therapeutic exercise;Therapeutic activities;Functional mobility training;Stair training;Balance training;Patient/family education;Moist Heat;Cryotherapy;Neuromuscular re-education;Passive range of motion;Manual techniques;Ultrasound;ADLs/Self Care Home Management;Electrical Stimulation;DME Instruction;Cognitive remediation;Dry needling;Energy conservation    PT Next Visit Plan Advance HEP, further assess balance, consider 6 min walk test  PT Home Exercise Plan 12/29/2020=Access Code: [email or web address]    Consulted and Agree with Plan of Care Patient             Patient will benefit from skilled therapeutic intervention in order to improve the following deficits and impairments:   Abnormal gait, Decreased balance, Decreased endurance, Decreased mobility, Difficulty walking, Decreased activity tolerance, Decreased safety awareness, Decreased strength, Postural dysfunction  Visit Diagnosis: Abnormality of gait and mobility  Difficulty in walking, not elsewhere classified  Muscle weakness (generalized)  Unsteadiness on feet     Problem List Patient Active Problem List   Diagnosis Date Noted   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 prolapse 10/24/2017   Depression, recurrent ([place]) 10/24/2017   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   Dysuria 10/21/2013   Nasal lesion 07/14/2013   Fatigue 04/02/2013   Breast cancer ([place]) 12/02/2012   CAD (coronary artery disease) 09/15/2012   Essential hypertension, benign 09/15/2012   Hypercholesterolemia 09/15/2012   Hypothyroidism 09/15/2012   GERD (gastroesophageal reflux disease) 09/15/2012   Diverticulosis 09/15/2012   Chronic back pain 09/15/2012   Hypokalemia 09/15/2012    Lewis Moccasin, PT 12/31/2020, 10:44 PM  [place] MAIN [facility] SERVICES [place], [place], [address] Phone: [phone]   Fax:  [phone]  Name: Laurie Hale MRN: [id number] Date of Birth: 1940/04/25

## 2021-01-04 ENCOUNTER — Ambulatory Visit: Payer: Medicare Other | Admitting: Physical Therapy

## 2021-01-05 ENCOUNTER — Ambulatory Visit: Payer: Medicare Other

## 2021-01-06 ENCOUNTER — Ambulatory Visit: Payer: Medicare Other | Admitting: Physical Therapy

## 2021-01-11 ENCOUNTER — Ambulatory Visit: Payer: Medicare Other | Admitting: Physical Therapy

## 2021-01-12 ENCOUNTER — Ambulatory Visit: Payer: Medicare Other

## 2021-01-12 ENCOUNTER — Other Ambulatory Visit: Payer: Self-pay

## 2021-01-12 DIAGNOSIS — R2681 Unsteadiness on feet: Secondary | ICD-10-CM

## 2021-01-12 DIAGNOSIS — M6281 Muscle weakness (generalized): Secondary | ICD-10-CM

## 2021-01-12 DIAGNOSIS — R262 Difficulty in walking, not elsewhere classified: Secondary | ICD-10-CM

## 2021-01-12 DIAGNOSIS — R269 Unspecified abnormalities of gait and mobility: Secondary | ICD-10-CM

## 2021-01-12 NOTE — Therapy (Signed)
[place] MAIN [facility] SERVICES [address] [place], [place], [address] Phone: [phone]   Fax:  [phone]  Physical Therapy Treatment  Patient Details  Name: CLORINDA WYBLE MRN: [id number] Date of Birth: 08-25-1940 Referring Provider (PT): Dr. 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, DCIS   Cataract    immature bilateral   Chronic back pain    Constipation    Coronary artery disease    Diverticulosis    GERD (gastroesophageal reflux disease)    takes a med daily-to bring with med name at surgery   History of migraine    hasn't had one in yrs-per pt Zoloft stopped them   Hyperlipidemia    takes Crestor and Niacin daily   Hypertension    takes Amlodipine  and Lisinopril daily   Hypokalemia    takes K Dur tid   Hypothyroidism    takes Synthroid daily   Insomnia    takes restoril prn   Joint pain    Joint swelling    Peripheral edema    takes HCTZ daily   Personal history of radiation therapy 2014   F/U lumpectomy    Presence of pessary     Past Surgical History:  Procedure Laterality Date   ABDOMINAL HYSTERECTOMY     BACK SURGERY  2009   BREAST BIOPSY Left 10/22/2012   Stereo - DCIS   BREAST LUMPECTOMY Left 2014   DCIS. F/U radiation    CARDIAC CATHETERIZATION  1999   CATARACT EXTRACTION W/PHACO Right 10/05/2015   Procedure:  CATARACT EXTRACTION PHACO AND INTRAOCULAR LENS PLACEMENT (IOC);  Surgeon: Ronnell Freshwater, MD;  Location: [place];  Service: Ophthalmology;  Laterality: Right;  RIGHT   CATARACT EXTRACTION W/PHACO Left 11/16/2015   Procedure: CATARACT EXTRACTION PHACO AND INTRAOCULAR LENS PLACEMENT (IOC);  Surgeon: Ronnell Freshwater, MD;  Location: [place];  Service: Ophthalmology;  Laterality: Left;  LEFT   COLONOSCOPY     CORONARY ANGIOPLASTY     1 stent   partial coloectomy  1995   right knee arthroscopy  2013   TONSILLECTOMY     TOTAL KNEE ARTHROPLASTY  01/02/2012   RIGHT KNEE   TOTAL KNEE ARTHROPLASTY  01/02/2012   Procedure: TOTAL KNEE ARTHROPLASTY;  Surgeon: Vickey Huger, MD;  Location: [place];  Service: Orthopedics;  Laterality: Right;   TOTAL KNEE ARTHROPLASTY Left 10/09/2017   Procedure: LEFT TOTAL KNEE ARTHROPLASTY;  Surgeon: Vickey Huger, MD;  Location: [facility];  Service: Orthopedics;  Laterality: Left;  with block    There were no vitals filed for this visit.   Subjective Assessment - 01/12/21 1521     Subjective Patient reports having a good day  without issues today. Denies any pain and no falls.    Patient is accompained by: --   friend, [name]   Pertinent History 80 yo Female presents to therapy with SPC, but states she uses a rollator at home. She reports a general decline in mobility in the last year and is wanting to get rid of the walker. She denies any recent falls. She reports her legs have been getting weaker in the last year. She denies any numbness/tingling; Pt lives at home, her son lives with her. She currently cleans her home and does some cooking. She does report taking a longer time to complete tasks. She also requires increased rest breaks due to fatigue; In addition to mobility issues, she does report some cognitive change with impaired memory; This has been going on for about a year. She reports occasional forgetting to do something but  otherwise reports independence in cognition. No difficulty managing medication or balancing checkbook;    Limitations Standing;Walking    How long can you sit comfortably? Not limited;    How long can you stand comfortably? 5 min;    How long can you walk comfortably? able to walk as far as needed with RW; hasn't used the cane a lot; She is able to walk short distances holding onto furniture without AD    Patient Stated Goals "Get rid of the walker, get stronger."    Currently in Pain? No/denies               INTERVENTIONS:   Therapeutic Exercises:   Seated LE Strengthening:  -ham curl with GTB 2 sets of 12 reps (Patient reports as - Easy) -Hip march with 4# ankle weights 2 sets of 12 reps (Patient rates as medium) -knee ext with (ball squeeze) with 4# ankle weights 2 sets of 12 reps (patient reports as medium) -Hip flex/abd up and over orange hurdle with 4# B LE - Static standing (back leg on airex pad and front leg on 6" step)  -Sit to stand without UE support x 10 reps (patient rates as "easier than last time") - Required visual demo to lean forward (nose over toes)  Step tap with 1UE support x 12 reps each.   Gait training:   - Gait with SPC approx 35 feet  - Gait without an AD in // bars - Forward flexed posture and patient complaining of LBP- down and back x 4 trials- short reciprocal steps.   Gait with 4WW at 120 feet- Patient required VC to stay within walker and stand as erect as posture. Patient reports limited with standing due to increased pain.      Clinical Impression: Patient performed well today and able to add more resistance with seated Therex today. She was well motivated and able to return demonstration of today's exercises without significant difficulty. She fatigues quickly but able to continue and complete all activities today. She would benefit from skilled PT Interventions to improve LE strength and increase mobility and gait safety for improved  quality of life and decreased risk of falling.                 PT Short Term Goals - 12/24/20 1545       PT SHORT TERM GOAL #1   Title Patient will be adherent to HEP at least 3x a week to improve functional strength and balance for better safety at home.    Time 4    Period Weeks    Status New  Target Date 01/21/21      PT SHORT TERM GOAL #2   Title Patient (> 57 years old) will complete five times sit to stand test in < 15 seconds indicating an increased LE strength and improved balance.    Time 4    Period Weeks    Status New    Target Date 01/21/21               PT Long Term Goals - 12/24/20 1546       PT LONG TERM GOAL #1   Title After 8 weeks pt to demonstrate improved score on FOTO survery >65 to improve independence in ADL, IADL.    Time 6    Period Weeks    Status New    Target Date 02/04/21      PT LONG TERM GOAL #2   Title Patient will increase six minute walk test distance to >1000 for progression to community ambulator and improve gait ability    Time 6    Period Weeks    Status New    Target Date 02/04/21      PT LONG TERM GOAL #3   Title Patient will increase 10 meter walk test to >0.8 m/s without AD as to improve gait speed for better home ambulation and to reduce fall risk.    Time 6    Period Weeks    Status New    Target Date 02/04/21      PT LONG TERM GOAL #4   Title Patient will increase BLE gross strength to 4+/5 as to improve functional strength for independent gait, increased standing tolerance and increased ADL ability.    Time 6    Period Weeks    Status New    Target Date 02/04/21      PT LONG TERM GOAL #5   Title Patient will reduce timed up and go to <15 seconds to reduce fall risk and demonstrate improved transfer/gait ability.    Time 6    Period Weeks    Status New    Target Date 02/04/21                   Plan - 01/13/21 1056     Clinical Impression Statement Patient performed well today and  able to add more resistance with seated Therex today. She was well motivated and able to return demonstration of today's exercises without significant difficulty. She fatigues quickly but able to continue and complete all activities today. She would benefit from skilled PT Interventions to improve LE strength and increase mobility and gait safety for improved quality of life and decreased risk of falling.    Personal Factors and Comorbidities Comorbidity 3+;Fitness;Past/Current Experience;Time since onset of injury/illness/exacerbation    Comorbidities PMH significant: HTN (controlled), hyperlipidemia, anxiety/depression, cataracts, insomnia (averages 6 hours of sleep a night), GERD, arthritis with history of knee replacement    Examination-Activity Limitations Bend;Carry;Lift;Locomotion Level;Squat;Stairs;Stand;Transfers    Examination-Participation Restrictions Community Activity;Cleaning;Laundry;Yard Work;Shop;Volunteer;Church    Stability/Clinical Decision Making Stable/Uncomplicated    Rehab Potential Good    PT Frequency 2x / week    PT Duration 6 weeks    PT Treatment/Interventions Gait training;Therapeutic exercise;Therapeutic activities;Functional mobility training;Stair training;Balance training;Patient/family education;Moist Heat;Cryotherapy;Neuromuscular re-education;Passive range of motion;Manual techniques;Ultrasound;ADLs/Self Care Home Management;Electrical Stimulation;DME Instruction;Cognitive remediation;Dry needling;Energy conservation    PT Next Visit Plan Advance HEP, further assess balance, consider 6 min walk test    PT Home Exercise Plan 12/29/2020=Access Code: [email or web address]    Consulted and  Agree with Plan of Care Patient             Patient will benefit from skilled therapeutic intervention in order to improve the following deficits and impairments:  Abnormal gait, Decreased balance, Decreased endurance, Decreased mobility, Difficulty walking, Decreased activity tolerance,  Decreased safety awareness, Decreased strength, Postural dysfunction  Visit Diagnosis: Abnormality of gait and mobility  Difficulty in walking, not elsewhere classified  Muscle weakness (generalized)  Unsteadiness on feet     Problem List Patient Active Problem List   Diagnosis Date Noted   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 prolapse 10/24/2017   Depression, recurrent ([place]) 10/24/2017   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   Dysuria 10/21/2013   Nasal lesion 07/14/2013   Fatigue 04/02/2013   Breast cancer ([place]) 12/02/2012   CAD (coronary artery disease) 09/15/2012   Essential hypertension, benign 09/15/2012   Hypercholesterolemia 09/15/2012   Hypothyroidism 09/15/2012   GERD (gastroesophageal reflux disease) 09/15/2012   Diverticulosis 09/15/2012   Chronic back pain 09/15/2012   Hypokalemia 09/15/2012    Lewis Moccasin, PT 01/13/2021, 1:31 PM  [place] MAIN [facility] SERVICES [place], [place], [address] Phone: [phone]   Fax:  [phone]  Name: LILLE KARIM MRN: [id number] Date of Birth: 1940/09/19

## 2021-01-13 ENCOUNTER — Ambulatory Visit: Payer: Medicare Other | Admitting: Physical Therapy

## 2021-01-18 ENCOUNTER — Ambulatory Visit: Payer: Medicare Other | Admitting: Physical Therapy

## 2021-01-20 ENCOUNTER — Ambulatory Visit: Payer: Medicare Other | Admitting: Physical Therapy

## 2021-01-22 ENCOUNTER — Ambulatory Visit: Payer: Medicare Other

## 2021-01-25 ENCOUNTER — Ambulatory Visit: Payer: Medicare Other | Admitting: Physical Therapy

## 2021-01-27 ENCOUNTER — Ambulatory Visit: Payer: Medicare Other | Admitting: Physical Therapy

## 2021-01-27 ENCOUNTER — Ambulatory Visit: Payer: Medicare Other

## 2021-01-29 ENCOUNTER — Ambulatory Visit: Payer: Medicare Other

## 2021-02-01 ENCOUNTER — Ambulatory Visit: Payer: Medicare Other | Admitting: Physical Therapy

## 2021-02-03 ENCOUNTER — Ambulatory Visit: Payer: Medicare Other

## 2021-02-03 ENCOUNTER — Ambulatory Visit: Payer: Medicare Other | Admitting: Physical Therapy

## 2021-02-10 ENCOUNTER — Ambulatory Visit: Payer: Medicare Other

## 2021-02-10 ENCOUNTER — Ambulatory Visit: Payer: Medicare Other | Admitting: Physical Therapy

## 2021-02-22 ENCOUNTER — Ambulatory Visit: Payer: Medicare Other

## 2021-02-24 ENCOUNTER — Ambulatory Visit: Payer: Medicare Other

## 2021-03-02 ENCOUNTER — Ambulatory Visit: Payer: Medicare Other

## 2021-03-09 ENCOUNTER — Ambulatory Visit: Payer: Medicare Other

## 2021-03-16 ENCOUNTER — Ambulatory Visit: Payer: Medicare Other

## 2021-03-18 ENCOUNTER — Ambulatory Visit: Payer: Medicare Other | Attending: Family Medicine

## 2021-03-23 ENCOUNTER — Ambulatory Visit: Payer: Medicare Other

## 2021-03-30 ENCOUNTER — Ambulatory Visit: Payer: Medicare Other

## 2021-04-06 ENCOUNTER — Ambulatory Visit: Payer: Medicare Other

## 2021-04-13 ENCOUNTER — Ambulatory Visit: Payer: Medicare Other

## 2022-02-25 ENCOUNTER — Inpatient Hospital Stay: Payer: Medicare Other

## 2022-02-25 ENCOUNTER — Inpatient Hospital Stay
Admission: EM | Admit: 2022-02-25 | Discharge: 2022-03-03 | DRG: 698 | Disposition: A | Discharge status: Skilled Nursing Facility | Payer: Medicare Other | Attending: Internal Medicine | Admitting: Internal Medicine

## 2022-02-25 ENCOUNTER — Emergency Department: Payer: Medicare Other

## 2022-02-25 ENCOUNTER — Other Ambulatory Visit: Payer: Self-pay

## 2022-02-25 DIAGNOSIS — B964 Proteus (mirabilis) (morganii) as the cause of diseases classified elsewhere: Secondary | ICD-10-CM | POA: Diagnosis present

## 2022-02-25 DIAGNOSIS — E039 Hypothyroidism, unspecified: Secondary | ICD-10-CM

## 2022-02-25 DIAGNOSIS — Z955 Presence of coronary angioplasty implant and graft: Secondary | ICD-10-CM

## 2022-02-25 DIAGNOSIS — F32A Depression, unspecified: Secondary | ICD-10-CM | POA: Diagnosis present

## 2022-02-25 DIAGNOSIS — R7989 Other specified abnormal findings of blood chemistry: Secondary | ICD-10-CM | POA: Diagnosis not present

## 2022-02-25 DIAGNOSIS — Z880 Allergy status to penicillin: Secondary | ICD-10-CM

## 2022-02-25 DIAGNOSIS — Z923 Personal history of irradiation: Secondary | ICD-10-CM | POA: Diagnosis not present

## 2022-02-25 DIAGNOSIS — D62 Acute posthemorrhagic anemia: Secondary | ICD-10-CM | POA: Insufficient documentation

## 2022-02-25 DIAGNOSIS — S82841D Displaced bimalleolar fracture of right lower leg, subsequent encounter for closed fracture with routine healing: Secondary | ICD-10-CM

## 2022-02-25 DIAGNOSIS — Z803 Family history of malignant neoplasm of breast: Secondary | ICD-10-CM

## 2022-02-25 DIAGNOSIS — R319 Hematuria, unspecified: Secondary | ICD-10-CM

## 2022-02-25 DIAGNOSIS — D509 Iron deficiency anemia, unspecified: Secondary | ICD-10-CM | POA: Diagnosis present

## 2022-02-25 DIAGNOSIS — G8929 Other chronic pain: Secondary | ICD-10-CM

## 2022-02-25 DIAGNOSIS — M79605 Pain in left leg: Secondary | ICD-10-CM | POA: Insufficient documentation

## 2022-02-25 DIAGNOSIS — Z881 Allergy status to other antibiotic agents status: Secondary | ICD-10-CM

## 2022-02-25 DIAGNOSIS — Z853 Personal history of malignant neoplasm of breast: Secondary | ICD-10-CM | POA: Diagnosis not present

## 2022-02-25 DIAGNOSIS — B9689 Other specified bacterial agents as the cause of diseases classified elsewhere: Secondary | ICD-10-CM | POA: Diagnosis not present

## 2022-02-25 DIAGNOSIS — K219 Gastro-esophageal reflux disease without esophagitis: Secondary | ICD-10-CM | POA: Diagnosis present

## 2022-02-25 DIAGNOSIS — Z7982 Long term (current) use of aspirin: Secondary | ICD-10-CM

## 2022-02-25 DIAGNOSIS — K7581 Nonalcoholic steatohepatitis (NASH): Secondary | ICD-10-CM | POA: Diagnosis present

## 2022-02-25 DIAGNOSIS — R296 Repeated falls: Secondary | ICD-10-CM | POA: Diagnosis present

## 2022-02-25 DIAGNOSIS — N3289 Other specified disorders of bladder: Secondary | ICD-10-CM | POA: Diagnosis not present

## 2022-02-25 DIAGNOSIS — I10 Essential (primary) hypertension: Secondary | ICD-10-CM | POA: Diagnosis not present

## 2022-02-25 DIAGNOSIS — W19XXXD Unspecified fall, subsequent encounter: Secondary | ICD-10-CM | POA: Diagnosis present

## 2022-02-25 DIAGNOSIS — E872 Acidosis, unspecified: Secondary | ICD-10-CM | POA: Diagnosis present

## 2022-02-25 DIAGNOSIS — E669 Obesity, unspecified: Secondary | ICD-10-CM | POA: Diagnosis present

## 2022-02-25 DIAGNOSIS — E7800 Pure hypercholesterolemia, unspecified: Secondary | ICD-10-CM | POA: Diagnosis present

## 2022-02-25 DIAGNOSIS — R310 Gross hematuria: Secondary | ICD-10-CM | POA: Diagnosis present

## 2022-02-25 DIAGNOSIS — N390 Urinary tract infection, site not specified: Secondary | ICD-10-CM | POA: Diagnosis present

## 2022-02-25 DIAGNOSIS — N760 Acute vaginitis: Secondary | ICD-10-CM | POA: Diagnosis present

## 2022-02-25 DIAGNOSIS — Z8249 Family history of ischemic heart disease and other diseases of the circulatory system: Secondary | ICD-10-CM

## 2022-02-25 DIAGNOSIS — S82891A Other fracture of right lower leg, initial encounter for closed fracture: Secondary | ICD-10-CM | POA: Diagnosis present

## 2022-02-25 DIAGNOSIS — G629 Polyneuropathy, unspecified: Secondary | ICD-10-CM | POA: Diagnosis present

## 2022-02-25 DIAGNOSIS — Z66 Do not resuscitate: Secondary | ICD-10-CM | POA: Diagnosis present

## 2022-02-25 DIAGNOSIS — S3722XA Contusion of bladder, initial encounter: Secondary | ICD-10-CM | POA: Insufficient documentation

## 2022-02-25 DIAGNOSIS — W19XXXA Unspecified fall, initial encounter: Secondary | ICD-10-CM

## 2022-02-25 DIAGNOSIS — N3001 Acute cystitis with hematuria: Secondary | ICD-10-CM

## 2022-02-25 DIAGNOSIS — M549 Dorsalgia, unspecified: Secondary | ICD-10-CM

## 2022-02-25 DIAGNOSIS — L89893 Pressure ulcer of other site, stage 3: Secondary | ICD-10-CM | POA: Insufficient documentation

## 2022-02-25 DIAGNOSIS — Z96653 Presence of artificial knee joint, bilateral: Secondary | ICD-10-CM | POA: Diagnosis present

## 2022-02-25 DIAGNOSIS — S3729XA Other injury of bladder, initial encounter: Principal | ICD-10-CM

## 2022-02-25 DIAGNOSIS — Z791 Long term (current) use of non-steroidal anti-inflammatories (NSAID): Secondary | ICD-10-CM

## 2022-02-25 DIAGNOSIS — E876 Hypokalemia: Secondary | ICD-10-CM | POA: Diagnosis present

## 2022-02-25 DIAGNOSIS — I2510 Atherosclerotic heart disease of native coronary artery without angina pectoris: Secondary | ICD-10-CM | POA: Diagnosis present

## 2022-02-25 DIAGNOSIS — Z79899 Other long term (current) drug therapy: Secondary | ICD-10-CM

## 2022-02-25 DIAGNOSIS — Z79890 Hormone replacement therapy (postmenopausal): Secondary | ICD-10-CM

## 2022-02-25 DIAGNOSIS — Y92009 Unspecified place in unspecified non-institutional (private) residence as the place of occurrence of the external cause: Secondary | ICD-10-CM

## 2022-02-25 DIAGNOSIS — Z6833 Body mass index (BMI) 33.0-33.9, adult: Secondary | ICD-10-CM

## 2022-02-25 DIAGNOSIS — N3000 Acute cystitis without hematuria: Secondary | ICD-10-CM

## 2022-02-25 DIAGNOSIS — N179 Acute kidney failure, unspecified: Secondary | ICD-10-CM | POA: Diagnosis present

## 2022-02-25 DIAGNOSIS — M1990 Unspecified osteoarthritis, unspecified site: Secondary | ICD-10-CM | POA: Diagnosis present

## 2022-02-25 DIAGNOSIS — M545 Low back pain, unspecified: Secondary | ICD-10-CM | POA: Diagnosis present

## 2022-02-25 DIAGNOSIS — R748 Abnormal levels of other serum enzymes: Secondary | ICD-10-CM | POA: Diagnosis present

## 2022-02-25 LAB — CBC WITH DIFFERENTIAL/PLATELET
Abs Immature Granulocytes: 0.03 10*3/uL (ref 0.00–0.07)
Basophils Absolute: 0 10*3/uL (ref 0.0–0.1)
Basophils Relative: 0 %
Eosinophils Absolute: 0 10*3/uL (ref 0.0–0.5)
Eosinophils Relative: 0 %
HCT: 35.7 % — ABNORMAL LOW (ref 36.0–46.0)
Hemoglobin: 11.3 g/dL — ABNORMAL LOW (ref 12.0–15.0)
Immature Granulocytes: 0 %
Lymphocytes Relative: 6 %
Lymphs Abs: 0.6 10*3/uL — ABNORMAL LOW (ref 0.7–4.0)
MCH: 28.9 pg (ref 26.0–34.0)
MCHC: 31.7 g/dL (ref 30.0–36.0)
MCV: 91.3 fL (ref 80.0–100.0)
Monocytes Absolute: 0.8 10*3/uL (ref 0.1–1.0)
Monocytes Relative: 9 %
Neutro Abs: 7.8 10*3/uL — ABNORMAL HIGH (ref 1.7–7.7)
Neutrophils Relative %: 85 %
Platelets: 314 10*3/uL (ref 150–400)
RBC: 3.91 MIL/uL (ref 3.87–5.11)
RDW: 13.7 % (ref 11.5–15.5)
WBC: 9.3 10*3/uL (ref 4.0–10.5)
nRBC: 0 % (ref 0.0–0.2)

## 2022-02-25 LAB — URINALYSIS, ROUTINE W REFLEX MICROSCOPIC
Bacteria, UA: NONE SEEN
RBC / HPF: >50 RBC/hpf — ABNORMAL HIGH (ref 0–5)
Specific Gravity, Urine: 1.03 (ref 1.005–1.030)
Squamous Epithelial / HPF: NONE SEEN /HPF (ref 0–5)
WBC, UA: >50 WBC/hpf — ABNORMAL HIGH (ref 0–5)

## 2022-02-25 LAB — TYPE AND SCREEN

## 2022-02-25 LAB — PROTIME-INR
INR: 1.3 — ABNORMAL HIGH (ref 0.8–1.2)
Prothrombin Time: 15.9 seconds — ABNORMAL HIGH (ref 11.4–15.2)

## 2022-02-25 LAB — WET PREP, GENITAL
Sperm: NONE SEEN
Trich, Wet Prep: NONE SEEN
WBC, Wet Prep HPF POC: >=10 — AB (ref <10)
Yeast Wet Prep HPF POC: NONE SEEN

## 2022-02-25 LAB — COMPREHENSIVE METABOLIC PANEL
ALT: 65 U/L — ABNORMAL HIGH (ref 0–44)
AST: 67 U/L — ABNORMAL HIGH (ref 15–41)
Albumin: 3.2 g/dL — ABNORMAL LOW (ref 3.5–5.0)
Alkaline Phosphatase: 119 U/L (ref 38–126)
Anion gap: 10 (ref 5–15)
BUN: 42 mg/dL — ABNORMAL HIGH (ref 8–23)
CO2: 21 mmol/L — ABNORMAL LOW (ref 22–32)
Calcium: 9.2 mg/dL (ref 8.9–10.3)
Chloride: 108 mmol/L (ref 98–111)
Creatinine, Ser: 1.39 mg/dL — ABNORMAL HIGH (ref 0.44–1.00)
GFR, Estimated: 38 mL/min — ABNORMAL LOW (ref >60)
Glucose, Bld: 145 mg/dL — ABNORMAL HIGH (ref 70–99)
Potassium: 3.9 mmol/L (ref 3.5–5.1)
Sodium: 139 mmol/L (ref 135–145)
Total Bilirubin: 1 mg/dL (ref 0.3–1.2)
Total Protein: 7.4 g/dL (ref 6.5–8.1)

## 2022-02-25 LAB — LIPASE, BLOOD: Lipase: 159 U/L — ABNORMAL HIGH (ref 11–51)

## 2022-02-25 LAB — TRIGLYCERIDES: Triglycerides: 74 mg/dL (ref <150)

## 2022-02-25 LAB — APTT: aPTT: 30 seconds (ref 24–36)

## 2022-02-25 MED ORDER — LEVOTHYROXINE SODIUM 88 MCG PO TABS
88.0000 ug | ORAL_TABLET | Freq: Every day | ORAL | Status: DC
  Administered 2022-02-26 – 2022-03-02 (×5): 88 ug via ORAL
  Filled 2022-02-25 (×6): qty 1

## 2022-02-25 MED ORDER — AMLODIPINE BESYLATE 5 MG PO TABS
5.0000 mg | ORAL_TABLET | Freq: Every day | ORAL | Status: DC
  Administered 2022-02-25 – 2022-02-27 (×3): 5 mg via ORAL
  Filled 2022-02-25 (×3): qty 1

## 2022-02-25 MED ORDER — ROSUVASTATIN CALCIUM 20 MG PO TABS
20.0000 mg | ORAL_TABLET | ORAL | Status: DC
  Administered 2022-02-25 – 2022-03-02 (×3): 20 mg via ORAL
  Filled 2022-02-25 (×3): qty 1

## 2022-02-25 MED ORDER — OXYCODONE-ACETAMINOPHEN 5-325 MG PO TABS
1.0000 | ORAL_TABLET | Freq: Once | ORAL | Status: AC
  Administered 2022-02-25: 1 via ORAL
  Filled 2022-02-25: qty 1

## 2022-02-25 MED ORDER — OXYCODONE HCL 5 MG PO TABS
5.0000 mg | ORAL_TABLET | Freq: Four times a day (QID) | ORAL | Status: DC | PRN
  Administered 2022-02-25 – 2022-02-26 (×3): 5 mg via ORAL
  Filled 2022-02-25 (×3): qty 1

## 2022-02-25 MED ORDER — SODIUM CHLORIDE 0.9 % IV SOLN
1.0000 g | Freq: Once | INTRAVENOUS | Status: AC
  Administered 2022-02-25: 1 g via INTRAVENOUS
  Filled 2022-02-25: qty 10

## 2022-02-25 MED ORDER — IOHEXOL 300 MG/ML  SOLN
80.0000 mL | Freq: Once | INTRAMUSCULAR | Status: AC | PRN
  Administered 2022-02-25: 80 mL via INTRAVENOUS

## 2022-02-25 MED ORDER — MORPHINE SULFATE (PF) 2 MG/ML IV SOLN
2.0000 mg | Freq: Once | INTRAVENOUS | Status: AC
  Administered 2022-02-25: 2 mg via INTRAVENOUS
  Filled 2022-02-25: qty 1

## 2022-02-25 MED ORDER — LISINOPRIL 10 MG PO TABS
40.0000 mg | ORAL_TABLET | Freq: Every day | ORAL | Status: DC
  Administered 2022-02-25: 40 mg via ORAL
  Filled 2022-02-25: qty 4

## 2022-02-25 MED ORDER — MORPHINE SULFATE (PF) 4 MG/ML IV SOLN
4.0000 mg | Freq: Once | INTRAVENOUS | Status: AC
  Administered 2022-02-25: 4 mg via INTRAVENOUS
  Filled 2022-02-25: qty 1

## 2022-02-25 MED ORDER — SODIUM CHLORIDE 0.9 % IV SOLN
1.0000 g | INTRAVENOUS | Status: DC
  Administered 2022-02-26 – 2022-02-28 (×3): 1 g via INTRAVENOUS
  Filled 2022-02-25 (×2): qty 10
  Filled 2022-02-25: qty 1

## 2022-02-25 MED ORDER — LACTATED RINGERS IV BOLUS
1000.0000 mL | Freq: Once | INTRAVENOUS | Status: AC
  Administered 2022-02-25: 1000 mL via INTRAVENOUS

## 2022-02-25 MED ORDER — METRONIDAZOLE 500 MG/100ML IV SOLN
500.0000 mg | Freq: Two times a day (BID) | INTRAVENOUS | Status: AC
  Administered 2022-02-25 – 2022-02-28 (×7): 500 mg via INTRAVENOUS
  Filled 2022-02-25 (×7): qty 100

## 2022-02-25 MED ORDER — HYDRALAZINE HCL 20 MG/ML IJ SOLN
5.0000 mg | INTRAMUSCULAR | Status: DC | PRN
  Administered 2022-02-27 – 2022-03-01 (×4): 5 mg via INTRAVENOUS
  Filled 2022-02-25 (×4): qty 1

## 2022-02-25 MED ORDER — METHOCARBAMOL 500 MG PO TABS
500.0000 mg | ORAL_TABLET | Freq: Three times a day (TID) | ORAL | Status: DC | PRN
  Administered 2022-02-25 – 2022-03-03 (×6): 500 mg via ORAL
  Filled 2022-02-25 (×6): qty 1

## 2022-02-25 MED ORDER — IOTHALAMATE MEGLUMINE 17.2 % UR SOLN
250.0000 mL | Freq: Once | URETHRAL | Status: AC | PRN
  Administered 2022-02-25: 250 mL via INTRAVESICAL

## 2022-02-25 MED ORDER — ONDANSETRON HCL 4 MG/2ML IJ SOLN: 4.0000 mg | Freq: Three times a day (TID) | INTRAMUSCULAR | Status: DC | PRN

## 2022-02-25 MED ORDER — SODIUM CHLORIDE 0.9 % IV SOLN: INTRAVENOUS | Status: DC

## 2022-02-25 MED ORDER — SERTRALINE HCL 50 MG PO TABS
150.0000 mg | ORAL_TABLET | Freq: Every day | ORAL | Status: DC
  Administered 2022-02-25 – 2022-03-03 (×7): 150 mg via ORAL
  Filled 2022-02-25 (×7): qty 3

## 2022-02-25 NOTE — Progress Notes (Signed)
CROSS COVER NOTE  NAME: Laurie Hale MRN: [id number] DOB : September 12, 1940    HPI/Events of Note   Nurse reports patient in severe pain and previously ordered oxycodone for moderate pain not due yet  Assessment and  Interventions   Assessment:  Plan: Morphine 2 mg x 1 Utilize methocarbamol when able as well given recent fall and bladder findings on [place] NP [facility]

## 2022-02-25 NOTE — H&P (Signed)
History and Physical    [name] ZDG:[id number] DOB: 1940-04-23 DOA: 02/25/2022  Referring MD/NP/PA:   PCP: Juluis Pitch, MD   Patient coming from:  The patient is coming from home.  At baseline, pt is independent for most of ADL.        Chief Complaint: hematuria, abdominal pain, fall  HPI: Laurie Hale is a 82 y.o. female with medical history significant of left ankle fracture on cast, hypertension, hyperlipidemia, CAD, hypothyroidism, GERD, depression, breast cancer, chronic back pain, who presents with hematuria, abdominal pain and fall.  Patient states that she has bleeding in rectal area intermittently in the past 2 weeks. She is not sure where is bleeding coming from. EDP did rectal and pelvic examination, found that most likely patient has hematuria.  No vaginal bleeding or rectal bleeding. Patient has increased urinary frequency, denies dysuria or burning on urination.  No nausea, vomiting or diarrhea.  Patient reports abdominal pain, which is located in the middle and lower abdomen, which is mild, aching, constant, nonradiating. Patient does not have chest pain, cough, shortness breath.  No fever or chills.  She has generalized weakness and fell this morning.  No loss of consciousness.  No headache or neck pain.  Patient has chronic back pain, which has worsened. Of note, patient had right ankle fracture for 6 weeks.  Patient states that she is supposed to have "cast" removed today.    Data reviewed independently and ED Course: pt was found to have WBC 9.3, positive urinalysis for UTI, wet prep is positive for clue cells, abnormal liver function (ALP 119, AST 67, ALT 65, total bilirubin 1.0), lipase 159, hemoglobin 11.3 (13.1 on 06/22/2021, temperature normal, blood pressure 145/71, heart rate 93, RR 16, oxygen saturation 98% on room air.  Patient is admitted to MedSurg bed as inpt. Consulted Dr. Erlene Quan of urology and Dr. Sharlet Salina of ortho.  CT-head and neck: 1. No acute  intracranial abnormality. 2. 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 3. No acute cervical spine fracture.  CT-abd/pelvis 1. Circumferential mural thickening of the urinary bladder with surrounding stranding and free fluid. Hyperattenuating, masslike lobulation along the posterior bladder wall measures 3.6 x 1.8 cm. While this finding may reflect layering hyperattenuating debris or blood products in the setting of acute cystitis, findings are suspicious for bladder malignancy. Recommend correlation with urinalysis and ultrasound examination of the bladder to confirm mass lesion. 2. Asymmetrically expanded left psoas muscle may reflect intramuscular hematoma in the setting of recent fall. 3. Subcentimeter hypoattenuating focus along the peripheral lateral spleen, too small to characterize. 4. Right inguinal lymph node measures 13 mm, indeterminate. 5. Small hiatal hernia. 6. Colonic diverticulosis without acute diverticulitis. 7. Coronary artery calcifications. Aortic Atherosclerosis (ICD10-I70.0).  CT-L spin 1. No acute fracture or traumatic listhesis of the lumbar spine. 2. Asymmetrically expanded left psoas muscle, which may represent an intramuscular hematoma. 3. Partially imaged bladder wall thickening and surrounding edema, better evaluated on concurrent CT abdomen and pelvis. 4. Small hiatal hernia. 5. Aortic Atherosclerosis (ICD10-I70.0).  EKG: I have personally reviewed.  Sinus rhythm, QTc 479, early R wave progression.   Review of Systems:   General: no fevers, chills, no body weight gain, has fatigue HEENT: no blurry vision, hearing changes or sore throat Respiratory: no dyspnea, coughing, wheezing CV: no chest pain, no palpitations GI: no nausea, vomiting, has abdominal pain, no diarrhea, constipation GU: no dysuria, burning on urination, has increased urinary  frequency, hematuria  Ext: no leg edema Neuro: no  unilateral weakness, numbness, or tingling, no vision change or hearing loss Skin: no rash, no skin tear. MSK: has back pain and right ankle Fx Heme: No easy bruising.  Travel history: No recent long distant travel.   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, DCIS   Cataract    immature bilateral   Chronic back pain    Constipation    Coronary artery disease    Diverticulosis    GERD (gastroesophageal reflux disease)    takes a med daily-to bring with med name at surgery   History of migraine    hasn't had one in yrs-per pt Zoloft stopped them   Hyperlipidemia    takes Crestor and Niacin daily   Hypertension    takes Amlodipine  and Lisinopril daily   Hypokalemia    takes K Dur tid   Hypothyroidism    takes Synthroid daily   Insomnia    takes restoril prn   Joint pain    Joint swelling    Peripheral edema    takes HCTZ daily   Personal history of radiation therapy 2014   F/U lumpectomy    Presence of pessary     Past Surgical History:  Procedure Laterality Date   ABDOMINAL HYSTERECTOMY     BACK SURGERY  2009   BREAST BIOPSY Left 10/22/2012   Stereo - DCIS   BREAST LUMPECTOMY Left 2014   DCIS. F/U radiation    CARDIAC CATHETERIZATION  1999   CATARACT EXTRACTION W/PHACO Right 10/05/2015   Procedure: CATARACT EXTRACTION PHACO AND INTRAOCULAR LENS PLACEMENT (IOC);  Surgeon: Ronnell Freshwater, MD;  Location: [place];  Service: Ophthalmology;  Laterality: Right;  RIGHT   CATARACT EXTRACTION W/PHACO Left 11/16/2015   Procedure: CATARACT EXTRACTION PHACO AND INTRAOCULAR LENS PLACEMENT (IOC);  Surgeon: Ronnell Freshwater, MD;  Location: [facility];  Service: Ophthalmology;  Laterality: Left;  LEFT   COLONOSCOPY     CORONARY ANGIOPLASTY     1 stent   partial coloectomy  1995   right knee  arthroscopy  2013   TONSILLECTOMY     TOTAL KNEE ARTHROPLASTY  01/02/2012   RIGHT KNEE   TOTAL KNEE ARTHROPLASTY  01/02/2012   Procedure: TOTAL KNEE ARTHROPLASTY;  Surgeon: Vickey Huger, MD;  Location: [place];  Service: Orthopedics;  Laterality: Right;   TOTAL KNEE ARTHROPLASTY Left 10/09/2017   Procedure: LEFT TOTAL KNEE ARTHROPLASTY;  Surgeon: Vickey Huger, MD;  Location: [facility];  Service: Orthopedics;  Laterality: Left;  with block    Social History:  reports that she has never smoked. She has never used smokeless tobacco. She reports that she does not drink alcohol and does not use drugs.  Family History:  Family History  Problem Relation Age of Onset   Hypertension Mother    Sudden death Sister        x2   Breast cancer Sister    Heart disease Brother      Prior to Admission medications   Medication Sig Start Date End Date Taking? Authorizing Provider  aspirin 325 MG EC tablet Take 1 tablet (325 mg total) by mouth 2 (two) times daily. 10/10/17   Donia Ast, PA  levothyroxine (SYNTHROID, LEVOTHROID) 88 MCG  tablet Take 88 mcg by mouth at bedtime.    [provider]  lisinopril (PRINIVIL,ZESTRIL) 40 MG tablet Take 1 tablet (40 mg total) by mouth daily. 01/31/14   Einar Pheasant, MD  meloxicam (MOBIC) 7.5 MG tablet Take 7.5 mg by mouth daily.    [provider]  morphine (MS CONTIN) 100 MG 12 hr tablet Take 100 mg by mouth every 8 (eight) hours as needed for pain.    [provider]  morphine (MSIR) 15 MG tablet Take 15 mg by mouth every 6 (six) hours as needed (for pain).    [provider]  rosuvastatin (CRESTOR) 20 MG tablet Take 20 mg by mouth every Monday, Wednesday, and Friday.    [provider]  sertraline (ZOLOFT) 100 MG tablet Take 1 tablet (100 mg total) by mouth daily. Patient taking differently: Take 150 mg by mouth daily.  07/11/13   Einar Pheasant, MD    Physical Exam: Vitals:   02/25/22 0717 02/25/22 0719  02/25/22 1154 02/25/22 1209  BP: (!) 145/71  137/62   Pulse: 93     Resp: 16     Temp: 98.4 F (36.9 C)   98.1 F (36.7 C)  TempSrc: Oral   Oral  SpO2: 98%     Weight:  81.6 kg    Height:  '5\' 1"'$  (1.549 m)     General: Not in acute distress HEENT:       Eyes: PERRL, EOMI, no scleral icterus.       ENT: No discharge from the ears and nose, no pharynx injection, no tonsillar enlargement.        Neck: No JVD, no bruit, no mass felt. Heme: No neck lymph node enlargement. Cardiac: S1/S2, RRR, No murmurs, No gallops or rubs. Respiratory: No rales, wheezing, rhonchi or rubs. GI: Soft, nondistended, nontender, no rebound pain, no organomegaly, BS present. GU: No hematuria Ext: No pitting leg edema bilaterally. 1+DP/PT pulse bilaterally. Musculoskeletal: has lower back pain. Has right ankle fracture Skin: No rashes.  Neuro: Alert, oriented X3, cranial nerves II-XII grossly intact, moves all extremities  Psych: Patient is not psychotic, no suicidal or hemocidal ideation.  Labs on Admission: I have personally reviewed following labs and imaging studies  CBC: Recent Labs  Lab 02/25/22 0737  WBC 9.3  NEUTROABS 7.8*  HGB 11.3*  HCT 35.7*  MCV 91.3  PLT 716   Basic Metabolic Panel: Recent Labs  Lab 02/25/22 0737  NA 139  K 3.9  CL 108  CO2 21*  GLUCOSE 145*  BUN 42*  CREATININE 1.39*  CALCIUM 9.2   GFR: Estimated Creatinine Clearance: 29.7 mL/min (A) (by C-G formula based on SCr of 1.39 mg/dL (H)). Liver Function Tests: Recent Labs  Lab 02/25/22 0737  AST 67*  ALT 65*  ALKPHOS 119  BILITOT 1.0  PROT 7.4  ALBUMIN 3.2*   Recent Labs  Lab 02/25/22 0737  LIPASE 159*   No results for input(s): "AMMONIA" in the last 168 hours. Coagulation Profile: No results for input(s): "INR", "PROTIME" in the last 168 hours. Cardiac Enzymes: No results for input(s): "CKTOTAL", "CKMB", "CKMBINDEX", "TROPONINI" in the last 168 hours. BNP (last 3 results) No results for  input(s): "PROBNP" in the last 8760 hours. HbA1C: No results for input(s): "HGBA1C" in the last 72 hours. CBG: No results for input(s): "GLUCAP" in the last 168 hours. Lipid Profile: Recent Labs    02/25/22 0737  TRIG 74   Thyroid Function Tests: No results for input(s): "  TSH", "T4TOTAL", "FREET4", "T3FREE", "THYROIDAB" in the last 72 hours. Anemia Panel: No results for input(s): "VITAMINB12", "FOLATE", "FERRITIN", "TIBC", "IRON", "RETICCTPCT" in the last 72 hours. Urine analysis:    Component Value Date/Time   COLORURINE RED (A) 02/25/2022 0737   APPEARANCEUR TURBID (A) 02/25/2022 0737   LABSPEC 1.030 02/25/2022 0737   PHURINE  02/25/2022 0737    TEST NOT REPORTED DUE TO COLOR INTERFERENCE OF URINE PIGMENT   GLUCOSEU (A) 02/25/2022 0737    TEST NOT REPORTED DUE TO COLOR INTERFERENCE OF URINE PIGMENT   GLUCOSEU NEGATIVE 10/17/2013 1532   HGBUR (A) 02/25/2022 0737    TEST NOT REPORTED DUE TO COLOR INTERFERENCE OF URINE PIGMENT   BILIRUBINUR (A) 02/25/2022 0737    TEST NOT REPORTED DUE TO COLOR INTERFERENCE OF URINE PIGMENT   KETONESUR (A) 02/25/2022 0737    TEST NOT REPORTED DUE TO COLOR INTERFERENCE OF URINE PIGMENT   PROTEINUR (A) 02/25/2022 0737    TEST NOT REPORTED DUE TO COLOR INTERFERENCE OF URINE PIGMENT   UROBILINOGEN 0.2 10/17/2013 1532   NITRITE (A) 02/25/2022 0737    TEST NOT REPORTED DUE TO COLOR INTERFERENCE OF URINE PIGMENT   LEUKOCYTESUR (A) 02/25/2022 0737    TEST NOT REPORTED DUE TO COLOR INTERFERENCE OF URINE PIGMENT   Sepsis Labs: '@LABRCNTIP'$ (procalcitonin:4,lacticidven:4) ) Recent Results (from the past 240 hour(s))  Wet prep, genital     Status: Abnormal   Collection Time: 02/25/22  7:55 AM   Specimen: Vaginal  Result Value Ref Range Status   Yeast Wet Prep HPF POC NONE SEEN NONE SEEN Final   Trich, Wet Prep NONE SEEN NONE SEEN Final   Clue Cells Wet Prep HPF POC PRESENT (A) NONE SEEN Final   WBC, Wet Prep HPF POC >=10 (A) <10 Final   Sperm NONE  SEEN  Final    Comment: Performed at [facility], [address], [place], [place] [address]     Radiological Exams on Admission: CT Abdomen Pelvis W Contrast  Addendum Date: 02/25/2022   ADDENDUM REPORT: 02/25/2022 12:40 ADDENDUM: Upon further review, in the setting of trauma, bladder rupture is also within the differential and can be evaluated with cystogram. Electronically Signed   By: Darrin Nipper M.D.   On: 02/25/2022 12:40   Result Date: 02/25/2022 CLINICAL DATA:  Fall out of bed with chronic back pain EXAM: CT ABDOMEN AND PELVIS WITH CONTRAST TECHNIQUE: Multidetector CT imaging of the abdomen and pelvis was performed using the standard protocol following bolus administration of intravenous contrast. RADIATION DOSE REDUCTION: This exam was performed according to the departmental dose-optimization program which includes automated exposure control, adjustment of the mA and/or kV according to patient size and/or use of iterative reconstruction technique. CONTRAST:  71m OMNIPAQUE IOHEXOL 300 MG/ML  SOLN COMPARISON:  CT lumbar spine dated 02/03/2017 FINDINGS: Lower chest: No focal consolidation or pulmonary nodule in the lung bases. No pleural effusion or pneumothorax demonstrated. Partially imaged heart size is normal. Coronary artery calcifications. Hepatobiliary: No focal hepatic lesions. No intra or extrahepatic biliary ductal dilation. Normal gallbladder. Pancreas: No focal lesions or main ductal dilation. Spleen: Subcentimeter hypoattenuating focus along the peripheral lateral spleen (3:16). Adrenals/Urinary Tract: No adrenal nodules. No suspicious renal mass, calculi or hydronephrosis. Circumferential mural thickening with surrounding stranding and free fluid. Hyperattenuating, masslike lobulation along the posterior bladder wall measures 3.6 x 1.8 cm (3:71). Stomach/Bowel: Small hiatal hernia. Normal appearance of the stomach. Duodenal diverticulum along the second portion. Mobile cecum in  the right upper  quadrant. No evidence of bowel wall thickening, distention, or inflammatory changes. Rectosigmoid anastomosis is patent. Colonic diverticulosis without acute diverticulitis. Status post appendectomy. Vascular/Lymphatic: Aortic atherosclerosis. Right inguinal lymph node measures 13 mm (3:76). Reproductive: No adnexal masses. Other: Small volume free fluid surrounding the urinary bladder. No fluid collection or free air. Musculoskeletal: No acute or abnormal lytic or blastic osseous lesions. Postsurgical changes from spinal fixation spanning T11-S1. Multilevel degenerative changes of the partially imaged thoracic and lumbar spine. Asymmetrically expanded appearance of the left psoas muscle, new from 02/03/2017, with associated adjacent stranding. IMPRESSION: 1. Circumferential mural thickening of the urinary bladder with surrounding stranding and free fluid. Hyperattenuating, masslike lobulation along the posterior bladder wall measures 3.6 x 1.8 cm. While this finding may reflect layering hyperattenuating debris or blood products in the setting of acute cystitis, findings are suspicious for bladder malignancy. Recommend correlation with urinalysis and ultrasound examination of the bladder to confirm mass lesion. 2. Asymmetrically expanded left psoas muscle may reflect intramuscular hematoma in the setting of recent fall. 3. Subcentimeter hypoattenuating focus along the peripheral lateral spleen, too small to characterize. 4. Right inguinal lymph node measures 13 mm, indeterminate. 5. Small hiatal hernia. 6. Colonic diverticulosis without acute diverticulitis. 7. Coronary artery calcifications. Aortic Atherosclerosis (ICD10-I70.0). Electronically Signed: By: Darrin Nipper M.D. On: 02/25/2022 09:27   CT L-SPINE NO CHARGE  Result Date: 02/25/2022 CLINICAL DATA:  Chronic back pain.  Presenting after fall out of bed EXAM: CT Lumbar spine with contrast TECHNIQUE: Multiplanar CT images of the lumbar spine  were reconstructed from contemporary CT of the Abdomen and Pelvis. RADIATION DOSE REDUCTION: This exam was performed according to the departmental dose-optimization program which includes automated exposure control, adjustment of the mA and/or kV according to patient size and/or use of iterative reconstruction technique. CONTRAST:  No additional COMPARISON:  CT lumbar spine dated 02/03/2017 FINDINGS: CT LUMBAR SPINE FINDINGS Segmentation: 5 lumbar type vertebrae. Alignment: Straightened status post posterior spinal fixation spanning T11-S1. Hardware appears intact. Vertebrae: No acute fracture or focal pathologic process. Paraspinal and other soft tissues: Aortic atherosclerosis. Small hiatal hernia. Asymmetrically expanded left psoas muscle. Partially imaged bladder wall thickening and surrounding edema. Disc levels: Similar multilevel intervertebral disc space narrowing of the imaged thoracolumbar spine. IMPRESSION: 1. No acute fracture or traumatic listhesis of the lumbar spine. 2. Asymmetrically expanded left psoas muscle, which may represent an intramuscular hematoma. 3. Partially imaged bladder wall thickening and surrounding edema, better evaluated on concurrent CT abdomen and pelvis. 4. Small hiatal hernia. 5. Aortic Atherosclerosis (ICD10-I70.0). Electronically Signed   By: Darrin Nipper M.D.   On: 02/25/2022 09:33   CT Head Wo Contrast  Result Date: 02/25/2022 CLINICAL DATA:  Trauma EXAM: CT HEAD WITHOUT CONTRAST CT CERVICAL SPINE WITHOUT CONTRAST TECHNIQUE: Multidetector CT imaging of the head and cervical spine was performed following the standard protocol without intravenous contrast. Multiplanar CT image reconstructions of the cervical spine were also generated. RADIATION DOSE REDUCTION: This exam was performed according to the departmental dose-optimization program which includes automated exposure control, adjustment of the mA and/or kV according to patient size and/or use of iterative reconstruction  technique. 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 There is sequela of mild chronic microvascular ischemic change. No evidence of hemorrhage, hydrocephalus, extra-axial collection or mass lesion/mass effect. Vascular: No hyperdense vessel or unexpected calcification. Skull: Normal. Negative  for fracture or focal lesion. Sinuses/Orbits: No mastoid or middle ear effusion. Paranasal sinuses are clear. Bilateral lens replacement. Bilateral orbits are otherwise unremarkable. Other: None. CT CERVICAL SPINE FINDINGS Alignment: Grade 1 anterolisthesis of C3 on C4 and C7 on T1. This is new compared to 2005. Skull base and vertebrae: Postsurgical changes from C5-C7 ACDF with solid osseous fusion C5-C6 incomplete fusion at C6-C7. There is also fusion at C4-C5. Soft tissues and spinal canal: No prevertebral fluid or swelling. No visible canal hematoma. Disc levels:  No CT evidence of high-grade spinal canal stenosis Upper chest: Negative. Other: None IMPRESSION: 1. No acute intracranial abnormality. 2. 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 3. No acute cervical spine fracture. Electronically Signed   By: Marin Roberts M.D.   On: 02/25/2022 09:26   CT Cervical Spine Wo Contrast  Result Date: 02/25/2022 CLINICAL DATA:  Trauma EXAM: CT HEAD WITHOUT CONTRAST CT CERVICAL SPINE WITHOUT CONTRAST TECHNIQUE: Multidetector CT imaging of the head and cervical spine was performed following the standard protocol without intravenous contrast. Multiplanar CT image reconstructions of the cervical spine were also generated. RADIATION DOSE REDUCTION: This exam was performed according to the departmental dose-optimization program which includes automated exposure control, adjustment of the mA and/or kV according to patient size and/or use of  iterative reconstruction technique. 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 There is sequela of mild chronic microvascular ischemic change. No evidence of hemorrhage, hydrocephalus, extra-axial collection or mass lesion/mass effect. Vascular: No hyperdense vessel or unexpected calcification. Skull: Normal. Negative for fracture or focal lesion. Sinuses/Orbits: No mastoid or middle ear effusion. Paranasal sinuses are clear. Bilateral lens replacement. Bilateral orbits are otherwise unremarkable. Other: None. CT CERVICAL SPINE FINDINGS Alignment: Grade 1 anterolisthesis of C3 on C4 and C7 on T1. This is new compared to 2005. Skull base and vertebrae: Postsurgical changes from C5-C7 ACDF with solid osseous fusion C5-C6 incomplete fusion at C6-C7. There is also fusion at C4-C5. Soft tissues and spinal canal: No prevertebral fluid or swelling. No visible canal hematoma. Disc levels:  No CT evidence of high-grade spinal canal stenosis Upper chest: Negative. Other: None IMPRESSION: 1. No acute intracranial abnormality. 2. 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 3. No acute cervical spine fracture. Electronically Signed   By: Marin Roberts M.D.   On: 02/25/2022 09:26      Assessment/Plan Principal Problem:   UTI (urinary tract infection) Active Problems:   Hematuria   CAD (coronary artery disease)   Essential hypertension, benign   Hypercholesterolemia   Hypothyroidism   Fall at home, initial encounter   Chronic back pain   Depression   AKI (acute kidney injury) ([place])   Abnormal LFTs   Elevated lipase   BV (bacterial vaginosis)   Closed right ankle fracture   Obesity (BMI 30-39.9)   Assessment and Plan:  UTI (urinary tract infection) and hematuria: CT scan showed circumferential mural thickening of the  urinary bladder with surrounding stranding and free fluid. Hyperattenuating, masslike lobulation along the posterior bladder wall measures 3.6 x 1.8 cm. Can not completely rule out the possibility of malignancy.  Consulted Dr. Erlene Quan of urology.  -will admit to MedSurg bed as inpatient -IV Rocephin - Follow up results of urine and amend antibiotic regimen  if needed per sensitivity results - prn Zofran for nausea - NS 75 cc/h - Foley cath placement per urology  CAD (coronary artery disease) -hold ASA due to hematuria -Crestor  Essential hypertension, benign -Amlodipine -Hold lisinopril lisinopril due to AKI -IV hydralazine as needed  Hypercholesterolemia -Crestor  Hypothyroidism -Synthroid  Fall at home, initial encounter: -Fall precaution -PT/OT when able to (not ordered yet)  Chronic back pain -As needed oxycodone and Robaxin, Tylenol  Depression -Zoloft  AKI (acute kidney injury) ([place]): Baseline creatinine 0.80 on 06/22/2021.  Creatinine 7.39, BUN 42, GFR 38.  Likely due to UTI. -IV fluid as above -Hold lisinopril  Abnormal LFTs: Mild.  CT scan of abdomen/pelvis showed no focal hepatic lesions. No intra or extrahepatic biliary ductal dilation. Normal gallbladder. -check hepatitis panel -Avoid using Tylenol  Elevated lipase: lipase 159, but CT of abd/pelvis showed no focal lesions of pancreas, no main ductal dilation -repeat lipase in AM -IVF as above  BV (bacterial vaginosis) -started Flagyl  Closed right ankle fracture: -consulted Dr. Sharlet Salina of ortho.  Obesity (BMI 30-39.9): Body weight 81.6 kg, BMI 33.99. -Healthy diet, exercise -Encourage losing weight    DVT ppx: SCD  Code Status: DNR per pt and her daughter  Family Communication:   Yes, patient's daughter   at bed side.    Disposition Plan:  Anticipate discharge back to previous environment  Consults called:   Consulted Dr. Erlene Quan of urology and Dr. Sharlet Salina of ortho  Admission status and  Level of care: Med-Surg:  as inpt   Dispo: The patient is from: Home               Anticipated d/c is to: Home              Anticipated d/c date is: 2 days              Patient currently is not medically stable to d/c.    Severity of Illness:  The appropriate patient status for this patient is INPATIENT. Inpatient status is judged to be reasonable and necessary in order to provide the required intensity of service to ensure the patient's safety. The patient's presenting symptoms, physical exam findings, and initial radiographic and laboratory data in the context of their chronic comorbidities is felt to place them at high risk for further clinical deterioration. Furthermore, it is not anticipated that the patient will be medically stable for discharge from the hospital within 2 midnights of admission.   * I certify that at the point of admission it is my clinical judgment that the patient will require inpatient hospital care spanning beyond 2 midnights from the point of admission due to high intensity of service, high risk for further deterioration and high frequency of surveillance required.*       Date of Service 02/25/2022    [facility]   If 7PM-7AM, please contact night-coverage [email or web address] 02/25/2022, 1:00 PM

## 2022-02-25 NOTE — Consult Note (Addendum)
Urology Consult  I have been asked to see the patient by Dr. Blaine Hamper, for evaluation and management of gross hematuria, possible bladder mass.  Chief Complaint: Gross hematuria  History of Present Illness: Laurie Hale is a 82 y.o. year old comorbid female with a recent history of multiple falls, including one in which she recently sustained a left ankle fracture who presented to the ED this morning with reports of hematuria and abdominal pain following another fall.  CTAP with contrast on arrival was notable for circumferential bladder wall thickening with surrounding haziness and free fluid and a hyperattenuating lobulation along the posterior bladder wall concerning for clot versus bladder mass.  She subsequently underwent CT cystogram, which confirmed and acute bladder dome perforation.  There was a filling defect seen more superiorly in the urinary bladder felt to represent the previously seen possible bladder mass which would further support it as a clot.  Admission UA notable for red color, >50 RBCs/hpf, and >50 WBCs/hpf; hemoglobin 11.3; creatinine 1.39; urine culture pending.  She has been started on antibiotics below.  18 French Foley catheter in place draining clear, red urine.  No clots noted in the Foley tubing.  Today she reports she noticed bleeding that she presumed was rectal in origin intermittently over the past 2 weeks.  She reports frequent falls at home that she attributes to weakness.  She denies mechanical falls.  Anti-infectives (From admission, onward)    Start     Dose/Rate Route Frequency Ordered Stop   02/26/22 0900  cefTRIAXone (ROCEPHIN) 1 g in sodium chloride 0.9 % 100 mL IVPB        1 g 200 mL/hr over 30 Minutes Intravenous Every 24 hours 02/25/22 1046     02/25/22 1100  metroNIDAZOLE (FLAGYL) IVPB 500 mg        500 mg 100 mL/hr over 60 Minutes Intravenous Every 12 hours 02/25/22 1047     02/25/22 0945  cefTRIAXone (ROCEPHIN) 1 g in sodium chloride 0.9 % 100  mL IVPB        1 g 200 mL/hr over 30 Minutes Intravenous  Once 02/25/22 0923 02/25/22 1058        Past Medical History:  Diagnosis Date   Arthritis    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, DCIS   Cataract    immature bilateral   Chronic back pain    Constipation    Coronary artery disease    Diverticulosis    GERD (gastroesophageal reflux disease)    takes a med daily-to bring with med name at surgery   History of migraine    hasn't had one in yrs-per pt Zoloft stopped them   Hyperlipidemia    takes Crestor and Niacin daily   Hypertension    takes Amlodipine  and Lisinopril daily   Hypokalemia    takes K Dur tid   Hypothyroidism    takes Synthroid daily   Insomnia    takes restoril prn   Joint pain    Joint swelling    Peripheral edema    takes HCTZ daily   Personal history of radiation therapy 2014   F/U lumpectomy    Presence of pessary     Past Surgical History:  Procedure Laterality Date   ABDOMINAL HYSTERECTOMY     BACK SURGERY  2009   BREAST BIOPSY Left 10/22/2012   Stereo - DCIS   BREAST LUMPECTOMY Left 2014   DCIS. F/U radiation    CARDIAC CATHETERIZATION  1999   CATARACT EXTRACTION W/PHACO Right 10/05/2015   Procedure: CATARACT EXTRACTION PHACO AND INTRAOCULAR LENS PLACEMENT (IOC);  Surgeon: Ronnell Freshwater, MD;  Location: [facility];  Service: Ophthalmology;  Laterality: Right;  RIGHT   CATARACT EXTRACTION W/PHACO Left 11/16/2015   Procedure: CATARACT EXTRACTION PHACO AND INTRAOCULAR LENS PLACEMENT (IOC);  Surgeon: Ronnell Freshwater, MD;  Location: [facility];  Service: Ophthalmology;  Laterality: Left;  LEFT   COLONOSCOPY     CORONARY ANGIOPLASTY     1 stent   partial coloectomy  1995   right knee arthroscopy  2013   TONSILLECTOMY     TOTAL KNEE ARTHROPLASTY  01/02/2012   RIGHT KNEE   TOTAL KNEE ARTHROPLASTY  01/02/2012   Procedure: TOTAL KNEE ARTHROPLASTY;  Surgeon: Vickey Huger, MD;  Location: [facility];   Service: Orthopedics;  Laterality: Right;   TOTAL KNEE ARTHROPLASTY Left 10/09/2017   Procedure: LEFT TOTAL KNEE ARTHROPLASTY;  Surgeon: Vickey Huger, MD;  Location: [facility];  Service: Orthopedics;  Laterality: Left;  with block    Home Medications:  Current Meds  Medication Sig   amLODipine (NORVASC) 5 MG tablet Take 5 mg by mouth daily.   levothyroxine (SYNTHROID, LEVOTHROID) 88 MCG tablet Take 88 mcg by mouth at bedtime.   lisinopril (PRINIVIL,ZESTRIL) 40 MG tablet Take 1 tablet (40 mg total) by mouth daily.   meloxicam (MOBIC) 7.5 MG tablet Take 7.5 mg by mouth daily.   morphine (MS CONTIN) 100 MG 12 hr tablet Take 100 mg by mouth every 8 (eight) hours as needed for pain.   morphine (MSIR) 15 MG tablet Take 15 mg by mouth every 6 (six) hours as needed (for pain).   rosuvastatin (CRESTOR) 20 MG tablet Take 20 mg by mouth every Monday, Wednesday, and Friday.   sertraline (ZOLOFT) 100 MG tablet Take 1 tablet (100 mg total) by mouth daily. (Patient taking differently: Take 150 mg by mouth daily. Take one and a half (1.5) tablets by mouth once daily)    Allergies:  Allergies  Allergen Reactions   Ciprofloxacin    Penicillins Hives          Family History  Problem Relation Age of Onset   Hypertension Mother    Sudden death Sister        x2   Breast cancer Sister    Heart disease Brother     Social History:  reports that she has never smoked. She has never used smokeless tobacco. She reports that she does not drink alcohol and does not use drugs.  ROS: A complete review of systems was performed.  All systems are negative except for pertinent findings as noted.  Physical Exam:  Vital signs in last 24 hours: Temp:  [98.1 F (36.7 C)-98.4 F (36.9 C)] 98.1 F (36.7 C) (01/12 1209) Pulse Rate:  [93] 93 (01/12 0717) Resp:  [16] 16 (01/12 0717) BP: (137-147)/(62-71) 147/65 (01/12 1429) SpO2:  [98 %] 98 % (01/12 0717) Weight:  [81.6 kg] 81.6 kg (01/12 0719) Constitutional:   Alert and oriented, uncomfortable appearing HEENT: [place] AT, moist mucus membranes Cardiovascular: No clubbing, cyanosis, or edema Respiratory: Normal respiratory effort Skin: No rashes, bruises or suspicious lesions Neurologic: Grossly intact, no focal deficits, moving all 4 extremities Psychiatric: Normal mood and affect  Laboratory Data:  Recent Labs    02/25/22 0737  WBC 9.3  HGB 11.3*  HCT 35.7*   Recent Labs    02/25/22 0737  NA 139  K 3.9  CL 108  CO2 21*  GLUCOSE 145*  BUN 42*  CREATININE 1.39*  CALCIUM 9.2   Recent Labs    02/25/22 1419  INR 1.3*   Urinalysis    Component Value Date/Time   COLORURINE RED (A) 02/25/2022 0737   APPEARANCEUR TURBID (A) 02/25/2022 0737   LABSPEC 1.030 02/25/2022 0737   PHURINE  02/25/2022 0737    TEST NOT REPORTED DUE TO COLOR INTERFERENCE OF URINE PIGMENT   GLUCOSEU (A) 02/25/2022 0737    TEST NOT REPORTED DUE TO COLOR INTERFERENCE OF URINE PIGMENT   GLUCOSEU NEGATIVE 10/17/2013 1532   HGBUR (A) 02/25/2022 0737    TEST NOT REPORTED DUE TO COLOR INTERFERENCE OF URINE PIGMENT   BILIRUBINUR (A) 02/25/2022 0737    TEST NOT REPORTED DUE TO COLOR INTERFERENCE OF URINE PIGMENT   KETONESUR (A) 02/25/2022 0737    TEST NOT REPORTED DUE TO COLOR INTERFERENCE OF URINE PIGMENT   PROTEINUR (A) 02/25/2022 0737    TEST NOT REPORTED DUE TO COLOR INTERFERENCE OF URINE PIGMENT   UROBILINOGEN 0.2 10/17/2013 1532   NITRITE (A) 02/25/2022 0737    TEST NOT REPORTED DUE TO COLOR INTERFERENCE OF URINE PIGMENT   LEUKOCYTESUR (A) 02/25/2022 0737    TEST NOT REPORTED DUE TO COLOR INTERFERENCE OF URINE PIGMENT   Results for orders placed or performed during the hospital encounter of 02/25/22  Wet prep, genital     Status: Abnormal   Collection Time: 02/25/22  7:55 AM   Specimen: Vaginal  Result Value Ref Range Status   Yeast Wet Prep HPF POC NONE SEEN NONE SEEN Final   Trich, Wet Prep NONE SEEN NONE SEEN Final   Clue Cells Wet Prep HPF POC  PRESENT (A) NONE SEEN Final   WBC, Wet Prep HPF POC >=10 (A) <10 Final   Sperm NONE SEEN  Final    Comment: Performed at [facility], [address] [place], [place] [address]    Radiologic Imaging: CT CYSTOGRAM PELVIS  Result Date: 02/25/2022 CLINICAL DATA:  Fall out of bed. Abnormal CT of the abdomen and pelvis. EXAM: CT CYSTOGRAM (CT PELVIS WITH CONTRAST) TECHNIQUE: Multidetector CT imaging through the pelvis was performed after dilute contrast had been introduced into the bladder for the purposes of performing CT cystography. RADIATION DOSE REDUCTION: This exam was performed according to the departmental dose-optimization program which includes automated exposure control, adjustment of the mA and/or kV according to patient size and/or use of iterative reconstruction technique. CONTRAST:  250 mL Cysto-Conray was instilled via a Foley catheter. COMPARISON:  CT of the abdomen pelvis 02/25/2022 FINDINGS: Urinary Tract: Bladder wall rupture is noted superiorly into the left. Hyperdense contrast is seen at the rupture site with more diluted contrast collecting about the urinary bladder. Filling defect is noted more superiorly in the urinary bladder than on the prior exam compatible with hematoma. No mass lesion is present. Bowel:  Unremarkable. Vascular/Lymphatic: Atherosclerotic calcifications again noted. No significant retroperitoneal adenopathy. Right inguinal node again noted. Reproductive:  Status post hysterectomy.  No adnexal mass. Musculoskeletal: Postoperative changes are present in the lumbar spine. No acute fractures are present. IMPRESSION: 1. Acute superior bladder wall rupture with extravasation of contrast. 2. Filling defect more superiorly in the urinary bladder than on the prior exam compatible with hematoma. Critical Value/emergent results were called by telephone at the time of interpretation on 02/25/2022 at 2:23 pm to provider Dr. Charna Archer, ED physician, who verbally  acknowledged these results. Electronically Signed   By: Harrell Gave  Mattern M.D.   On: 02/25/2022 14:24   CT Abdomen Pelvis W Contrast  Addendum Date: 02/25/2022   ADDENDUM REPORT: 02/25/2022 12:40 ADDENDUM: Upon further review, in the setting of trauma, bladder rupture is also within the differential and can be evaluated with cystogram. Electronically Signed   By: Darrin Nipper M.D.   On: 02/25/2022 12:40   Result Date: 02/25/2022 CLINICAL DATA:  Fall out of bed with chronic back pain EXAM: CT ABDOMEN AND PELVIS WITH CONTRAST TECHNIQUE: Multidetector CT imaging of the abdomen and pelvis was performed using the standard protocol following bolus administration of intravenous contrast. RADIATION DOSE REDUCTION: This exam was performed according to the departmental dose-optimization program which includes automated exposure control, adjustment of the mA and/or kV according to patient size and/or use of iterative reconstruction technique. CONTRAST:  38m OMNIPAQUE IOHEXOL 300 MG/ML  SOLN COMPARISON:  CT lumbar spine dated 02/03/2017 FINDINGS: Lower chest: No focal consolidation or pulmonary nodule in the lung bases. No pleural effusion or pneumothorax demonstrated. Partially imaged heart size is normal. Coronary artery calcifications. Hepatobiliary: No focal hepatic lesions. No intra or extrahepatic biliary ductal dilation. Normal gallbladder. Pancreas: No focal lesions or main ductal dilation. Spleen: Subcentimeter hypoattenuating focus along the peripheral lateral spleen (3:16). Adrenals/Urinary Tract: No adrenal nodules. No suspicious renal mass, calculi or hydronephrosis. Circumferential mural thickening with surrounding stranding and free fluid. Hyperattenuating, masslike lobulation along the posterior bladder wall measures 3.6 x 1.8 cm (3:71). Stomach/Bowel: Small hiatal hernia. Normal appearance of the stomach. Duodenal diverticulum along the second portion. Mobile cecum in the right upper quadrant. No  evidence of bowel wall thickening, distention, or inflammatory changes. Rectosigmoid anastomosis is patent. Colonic diverticulosis without acute diverticulitis. Status post appendectomy. Vascular/Lymphatic: Aortic atherosclerosis. Right inguinal lymph node measures 13 mm (3:76). Reproductive: No adnexal masses. Other: Small volume free fluid surrounding the urinary bladder. No fluid collection or free air. Musculoskeletal: No acute or abnormal lytic or blastic osseous lesions. Postsurgical changes from spinal fixation spanning T11-S1. Multilevel degenerative changes of the partially imaged thoracic and lumbar spine. Asymmetrically expanded appearance of the left psoas muscle, new from 02/03/2017, with associated adjacent stranding. IMPRESSION: 1. Circumferential mural thickening of the urinary bladder with surrounding stranding and free fluid. Hyperattenuating, masslike lobulation along the posterior bladder wall measures 3.6 x 1.8 cm. While this finding may reflect layering hyperattenuating debris or blood products in the setting of acute cystitis, findings are suspicious for bladder malignancy. Recommend correlation with urinalysis and ultrasound examination of the bladder to confirm mass lesion. 2. Asymmetrically expanded left psoas muscle may reflect intramuscular hematoma in the setting of recent fall. 3. Subcentimeter hypoattenuating focus along the peripheral lateral spleen, too small to characterize. 4. Right inguinal lymph node measures 13 mm, indeterminate. 5. Small hiatal hernia. 6. Colonic diverticulosis without acute diverticulitis. 7. Coronary artery calcifications. Aortic Atherosclerosis (ICD10-I70.0). Electronically Signed: By: LDarrin NipperM.D. On: 02/25/2022 09:27   CT L-SPINE NO CHARGE  Result Date: 02/25/2022 CLINICAL DATA:  Chronic back pain.  Presenting after fall out of bed EXAM: CT Lumbar spine with contrast TECHNIQUE: Multiplanar CT images of the lumbar spine were reconstructed from  contemporary CT of the Abdomen and Pelvis. RADIATION DOSE REDUCTION: This exam was performed according to the departmental dose-optimization program which includes automated exposure control, adjustment of the mA and/or kV according to patient size and/or use of iterative reconstruction technique. CONTRAST:  No additional COMPARISON:  CT lumbar spine dated 02/03/2017 FINDINGS: CT LUMBAR SPINE FINDINGS Segmentation: 5 lumbar type vertebrae.  Alignment: Straightened status post posterior spinal fixation spanning T11-S1. Hardware appears intact. Vertebrae: No acute fracture or focal pathologic process. Paraspinal and other soft tissues: Aortic atherosclerosis. Small hiatal hernia. Asymmetrically expanded left psoas muscle. Partially imaged bladder wall thickening and surrounding edema. Disc levels: Similar multilevel intervertebral disc space narrowing of the imaged thoracolumbar spine. IMPRESSION: 1. No acute fracture or traumatic listhesis of the lumbar spine. 2. Asymmetrically expanded left psoas muscle, which may represent an intramuscular hematoma. 3. Partially imaged bladder wall thickening and surrounding edema, better evaluated on concurrent CT abdomen and pelvis. 4. Small hiatal hernia. 5. Aortic Atherosclerosis (ICD10-I70.0). Electronically Signed   By: Darrin Nipper M.D.   On: 02/25/2022 09:33   CT Head Wo Contrast  Result Date: 02/25/2022 CLINICAL DATA:  Trauma EXAM: CT HEAD WITHOUT CONTRAST CT CERVICAL SPINE WITHOUT CONTRAST TECHNIQUE: Multidetector CT imaging of the head and cervical spine was performed following the standard protocol without intravenous contrast. Multiplanar CT image reconstructions of the cervical spine were also generated. RADIATION DOSE REDUCTION: This exam was performed according to the departmental dose-optimization program which includes automated exposure control, adjustment of the mA and/or kV according to patient size and/or use of iterative reconstruction technique. 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 There is sequela of mild chronic microvascular ischemic change. No evidence of hemorrhage, hydrocephalus, extra-axial collection or mass lesion/mass effect. Vascular: No hyperdense vessel or unexpected calcification. Skull: Normal. Negative for fracture or focal lesion. Sinuses/Orbits: No mastoid or middle ear effusion. Paranasal sinuses are clear. Bilateral lens replacement. Bilateral orbits are otherwise unremarkable. Other: None. CT CERVICAL SPINE FINDINGS Alignment: Grade 1 anterolisthesis of C3 on C4 and C7 on T1. This is new compared to 2005. Skull base and vertebrae: Postsurgical changes from C5-C7 ACDF with solid osseous fusion C5-C6 incomplete fusion at C6-C7. There is also fusion at C4-C5. Soft tissues and spinal canal: No prevertebral fluid or swelling. No visible canal hematoma. Disc levels:  No CT evidence of high-grade spinal canal stenosis Upper chest: Negative. Other: None IMPRESSION: 1. No acute intracranial abnormality. 2. 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 3. No acute cervical spine fracture. Electronically Signed   By: Marin Roberts M.D.   On: 02/25/2022 09:26   CT Cervical Spine Wo Contrast  Result Date: 02/25/2022 CLINICAL DATA:  Trauma EXAM: CT HEAD WITHOUT CONTRAST CT CERVICAL SPINE WITHOUT CONTRAST TECHNIQUE: Multidetector CT imaging of the head and cervical spine was performed following the standard protocol without intravenous contrast. Multiplanar CT image reconstructions of the cervical spine were also generated. RADIATION DOSE REDUCTION: This exam was performed according to the departmental dose-optimization program which includes automated exposure control, adjustment of the mA and/or kV according to patient size and/or use of iterative reconstruction  technique. 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 There is sequela of mild chronic microvascular ischemic change. No evidence of hemorrhage, hydrocephalus, extra-axial collection or mass lesion/mass effect. Vascular: No hyperdense vessel or unexpected calcification. Skull: Normal. Negative for fracture or focal lesion. Sinuses/Orbits: No mastoid or middle ear effusion. Paranasal sinuses are clear. Bilateral lens replacement. Bilateral orbits are otherwise unremarkable. Other: None. CT CERVICAL SPINE FINDINGS Alignment: Grade  1 anterolisthesis of C3 on C4 and C7 on T1. This is new compared to 2005. Skull base and vertebrae: Postsurgical changes from C5-C7 ACDF with solid osseous fusion C5-C6 incomplete fusion at C6-C7. There is also fusion at C4-C5. Soft tissues and spinal canal: No prevertebral fluid or swelling. No visible canal hematoma. Disc levels:  No CT evidence of high-grade spinal canal stenosis Upper chest: Negative. Other: None IMPRESSION: 1. No acute intracranial abnormality. 2. 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 3. No acute cervical spine fracture. Electronically Signed   By: Marin Roberts M.D.   On: 02/25/2022 09:26    Assessment & Plan:  82 year old female admitted with gross hematuria with a recent history of multiple falls with imaging findings of bladder dome perforation.  On review of her CT cystogram, it appears that there may be at least a small portion of the bladder perforation that is intraperitoneal.  Given apparent migration of the filling defect on CT cystogram, I do think this is more likely represent bladder clot versus hematoma over bladder mass, however she will require outpatient cystoscopy regardless for further evaluation.  I had a lengthy conversation with the patient at  the bedside today.  We discussed that the standard of care for bladder perforations is surgical repair, however given her age and comorbidities and the rather limited spread of extravasation seen on CT cystogram, we could consider conservative management with Foley catheter for urinary decompression instead.  Patient reports she would like to avoid surgery if at all possible.  We discussed the risks of conservative management, including ileus, intra-abdominal infection, and intra-abdominal abscess.  We discussed that if conservative management fails, she may require surgical intervention regardless.  She expressed understanding of these risks and wishes to pursue conservative management with continued Foley catheter.  Recommendations: -Continue Foley catheter -Trend CBC -Continue antibiotics and follow cultures -Urology will continue to follow, consider surgical bladder repair if she develops signs of systemic infection, ileus, or abscess or if her bladder perforation does not spontaneously resolve -She will ultimately require outpatient cystoscopy for definitive evaluation of her posterior bladder mass which likely represents blood product as above  Thank you for involving me in this patient's care, I will continue to follow along.  Debroah Loop, PA-C 02/25/2022 3:07 PM

## 2022-02-25 NOTE — Progress Notes (Signed)
Brief Urology Progress Note  I returned to the beside this afternoon. Patient denies abdominal pain today or since her onset of gross hematuria. Additional physical exam findings as below.  She is now accompanied by her daughter at the bedside, who reports she first noticed blood on her mother's sheets 2-3 weeks ago; she asked about this, but the patient brushed it off. She confirms multiple recent falls due to weakness, particularly since her ankle fracture. She is concerned regarding her mother's ability to manage a Foley at home and thinks she may benefit from additional support on discharge, e.g. short term rehab, home health. I feel this is very appropriate given her current presentation.  Physical Exam: GI: Abdomen is soft and nondistended with some tenderness to deep palpation; no rebound, rigidity, or guarding GU: Foley catheter in place draining red urine  Debroah Loop, PA-C  02/25/22 5:12 PM

## 2022-02-25 NOTE — ED Triage Notes (Signed)
Pt to ED via EMS. Reported vaginal bleed following a UTI. Pt also c/o a "broken foot" with a cast and chronic back pain.

## 2022-02-25 NOTE — ED Provider Notes (Signed)
[facility] Provider Note    Event Date/Time   First MD Initiated Contact with Patient 02/25/22 [phone]     (approximate)   History   Chief Complaint Fall   HPI  Laurie Hale is a 82 y.o. female with past medical history of hypertension, hyperlipidemia, and CAD who presents to the ED complaining of fall.  Patient reports that she has noticed bright red blood from her rectal area both with bowel movements and between bowel movements for about the past 2 weeks.  EMS had reported that patient was dealing with vaginal bleeding following a recent UTI, but patient denies any vaginal bleeding or discharge, states she has not been treated for UTI recently.  She does report that she had a fall this morning after getting up and tried to go to the bathroom, is not sure exactly what caused her to fall.  She does not think she hit her head or lost consciousness, does complain of significant pain in her left lower back.  She denies any pain in her chest but does endorse pain across her abdomen diffusely.  She denies any fevers, dysuria, hematuria, or flank pain.  Patient reports that her daughter came to check on her this morning and was concerned, so called EMS.     Physical Exam   Triage Vital Signs: ED Triage Vitals  Enc Vitals Group     BP      Pulse      Resp      Temp      Temp src      SpO2      Weight      Height      Head Circumference      Peak Flow      Pain Score      Pain Loc      Pain Edu?      Excl. in [place]?     Most recent vital signs: Vitals:   02/25/22 0717  BP: (!) 145/71  Pulse: 93  Resp: 16  Temp: 98.4 F (36.9 C)  SpO2: 98%    Constitutional: Alert and oriented. Eyes: Conjunctivae are normal. Head: Atraumatic. Nose: No congestion/rhinnorhea. Mouth/Throat: Mucous membranes are dry.  Neck: No midline cervical spine tenderness to palpation. Cardiovascular: Normal rate, regular rhythm. Grossly normal heart sounds.  2+ radial pulses  bilaterally. Respiratory: Normal respiratory effort.  No retractions. Lungs CTAB. Gastrointestinal: Soft and diffusely tender to palpation with no rebound or guarding. No distention.  Rectal exam with no bleeding noted, stool is guaiac negative. Genitourinary: Pelvic exam with no bleeding or discharge noted. Musculoskeletal: No lower extremity tenderness nor edema, cast in place to right lower leg.  Midline and left lateral lumbar spinal tenderness to palpation.  No thoracic spinal tenderness to palpation.  No upper extremity bony tenderness to palpation. Neurologic:  Normal speech and language. No gross focal neurologic deficits are appreciated.    ED Results / Procedures / Treatments   Labs (all labs ordered are listed, but only abnormal results are displayed) Labs Reviewed  WET PREP, GENITAL - Abnormal; Notable for the following components:      Result Value   Clue Cells Wet Prep HPF POC PRESENT (*)    WBC, Wet Prep HPF POC >=10 (*)    All other components within normal limits  CBC WITH DIFFERENTIAL/PLATELET - Abnormal; Notable for the following components:   Hemoglobin 11.3 (*)    HCT 35.7 (*)    Neutro  Abs 7.8 (*)    Lymphs Abs 0.6 (*)    All other components within normal limits  COMPREHENSIVE METABOLIC PANEL - Abnormal; Notable for the following components:   CO2 21 (*)    Glucose, Bld 145 (*)    BUN 42 (*)    Creatinine, Ser 1.39 (*)    Albumin 3.2 (*)    AST 67 (*)    ALT 65 (*)    GFR, Estimated 38 (*)    All other components within normal limits  LIPASE, BLOOD - Abnormal; Notable for the following components:   Lipase 159 (*)    All other components within normal limits  URINALYSIS, ROUTINE W REFLEX MICROSCOPIC - Abnormal; Notable for the following components:   Color, Urine RED (*)    APPearance TURBID (*)    Glucose, UA   (*)    Value: TEST NOT REPORTED DUE TO COLOR INTERFERENCE OF URINE PIGMENT   Hgb urine dipstick   (*)    Value: TEST NOT REPORTED DUE TO  COLOR INTERFERENCE OF URINE PIGMENT   Bilirubin Urine   (*)    Value: TEST NOT REPORTED DUE TO COLOR INTERFERENCE OF URINE PIGMENT   Ketones, ur   (*)    Value: TEST NOT REPORTED DUE TO COLOR INTERFERENCE OF URINE PIGMENT   Protein, ur   (*)    Value: TEST NOT REPORTED DUE TO COLOR INTERFERENCE OF URINE PIGMENT   Nitrite   (*)    Value: TEST NOT REPORTED DUE TO COLOR INTERFERENCE OF URINE PIGMENT   Leukocytes,Ua   (*)    Value: TEST NOT REPORTED DUE TO COLOR INTERFERENCE OF URINE PIGMENT   RBC / HPF >50 (*)    WBC, UA >50 (*)    All other components within normal limits  URINE CULTURE     EKG  ED ECG REPORT I, Blake Divine, the attending physician, personally viewed and interpreted this ECG.   Date: 02/25/2022  EKG Time: 7:16  Rate: 93  Rhythm: normal sinus rhythm  Axis: Normal  Intervals:none  ST&T Change: None  RADIOLOGY CT head reviewed and interpreted by me with no hemorrhage or midline shift, CT cervical spine reviewed and interpreted by me with no fracture or dislocation.  PROCEDURES:  Critical Care performed: No  Procedures   MEDICATIONS ORDERED IN ED: Medications  cefTRIAXone (ROCEPHIN) 1 g in sodium chloride 0.9 % 100 mL IVPB (1 g Intravenous New Bag/Given 02/25/22 0956)  morphine (PF) 4 MG/ML injection 4 mg (4 mg Intravenous Given 02/25/22 0745)  lactated ringers bolus 1,000 mL (0 mLs Intravenous Stopped 02/25/22 0942)  iohexol (OMNIPAQUE) 300 MG/ML solution 80 mL (80 mLs Intravenous Contrast Given 02/25/22 0831)  oxyCODONE-acetaminophen (PERCOCET/ROXICET) 5-325 MG per tablet 1 tablet (1 tablet Oral Given 02/25/22 1012)     IMPRESSION / MDM / ASSESSMENT AND PLAN / ED COURSE  I reviewed the triage vital signs and the nursing notes.                              82 y.o. female with past medical history of hypertension, hyperlipidemia, and CAD who presents to the ED with 2 weeks of reported rectal bleeding with a fall earlier this morning, now complains of  abdominal and back pain.  Patient's presentation is most consistent with acute presentation with potential threat to life or bodily function.  Differential diagnosis includes, but is not limited to, rectal bleeding, lower GI  bleed, anemia, electrolyte abnormality, AKI, intracranial injury, cervical spine injury, cystitis, diverticulitis, kidney stone, pyelonephritis, lumbar spinal injury.  Patient well-appearing and in no acute distress, vital signs are unremarkable.  She is diffusely tender across her abdomen as well as along her midline lumbar spine and left lateral lumbar spine.  Given advanced age and unclear history of fall, will further assess with CT head, and cervical spine in addition to CT of her abdomen/pelvis and lumbar spine.  Plan to further assess with labs and urinalysis, also perform pelvic and rectal exam to better assess source of reported bleeding.  Rectal and vaginal exam show no evidence of bleeding, however patient now putting out significant bloody urine via pure wick.  Daughter at bedside describes seeing toilet bowl full of blood this morning, likely due to gross hematuria.  I am concerned this is due to cystitis as CT scan shows significant inflammatory changes around the bladder, will send for culture and treat with IV Rocephin.  There is additionally hyperattenuation inside the bladder, would favor blood products, but if this does not improve following treatment with antibiotics, will require further investigation.  There is questionable psoas hematoma on CT imaging, but no significant traumatic injury noted to her head or neck.  Labs remarkable for mild anemia which we will trend, no significant leukocytosis or electrolyte abnormality, patient does have significant AKI.  Patient hydrated with IV fluids and case discussed with hospitalist for admission.      FINAL CLINICAL IMPRESSION(S) / ED DIAGNOSES   Final diagnoses:  Gross hematuria  Acute cystitis without hematuria   AKI (acute kidney injury) ([place])  Fall, initial encounter     Rx / DC Orders   ED Discharge Orders     None        Note:  This document was prepared using Dragon voice recognition software and may include unintentional dictation errors.   Blake Divine, MD 02/25/22 1024

## 2022-02-26 ENCOUNTER — Inpatient Hospital Stay: Payer: Medicare Other

## 2022-02-26 ENCOUNTER — Encounter: Payer: Self-pay | Admitting: Internal Medicine

## 2022-02-26 DIAGNOSIS — D62 Acute posthemorrhagic anemia: Secondary | ICD-10-CM | POA: Insufficient documentation

## 2022-02-26 DIAGNOSIS — N3289 Other specified disorders of bladder: Secondary | ICD-10-CM | POA: Diagnosis not present

## 2022-02-26 DIAGNOSIS — N3001 Acute cystitis with hematuria: Secondary | ICD-10-CM | POA: Diagnosis not present

## 2022-02-26 DIAGNOSIS — S3729XA Other injury of bladder, initial encounter: Secondary | ICD-10-CM | POA: Diagnosis not present

## 2022-02-26 DIAGNOSIS — R310 Gross hematuria: Secondary | ICD-10-CM | POA: Diagnosis not present

## 2022-02-26 DIAGNOSIS — S3722XA Contusion of bladder, initial encounter: Secondary | ICD-10-CM

## 2022-02-26 DIAGNOSIS — E872 Acidosis, unspecified: Secondary | ICD-10-CM | POA: Insufficient documentation

## 2022-02-26 LAB — COMPREHENSIVE METABOLIC PANEL
ALT: 50 U/L — ABNORMAL HIGH (ref 0–44)
AST: 65 U/L — ABNORMAL HIGH (ref 15–41)
Albumin: 2.6 g/dL — ABNORMAL LOW (ref 3.5–5.0)
Alkaline Phosphatase: 80 U/L (ref 38–126)
Anion gap: 9 (ref 5–15)
BUN: 33 mg/dL — ABNORMAL HIGH (ref 8–23)
CO2: 19 mmol/L — ABNORMAL LOW (ref 22–32)
Calcium: 8.2 mg/dL — ABNORMAL LOW (ref 8.9–10.3)
Chloride: 109 mmol/L (ref 98–111)
Creatinine, Ser: 0.98 mg/dL (ref 0.44–1.00)
GFR, Estimated: 57 mL/min — ABNORMAL LOW (ref >60)
Glucose, Bld: 117 mg/dL — ABNORMAL HIGH (ref 70–99)
Potassium: 3.6 mmol/L (ref 3.5–5.1)
Sodium: 137 mmol/L (ref 135–145)
Total Bilirubin: 0.8 mg/dL (ref 0.3–1.2)
Total Protein: 6.1 g/dL — ABNORMAL LOW (ref 6.5–8.1)

## 2022-02-26 LAB — CBC
HCT: 27.2 % — ABNORMAL LOW (ref 36.0–46.0)
Hemoglobin: 8.7 g/dL — ABNORMAL LOW (ref 12.0–15.0)
MCH: 28.5 pg (ref 26.0–34.0)
MCHC: 32 g/dL (ref 30.0–36.0)
MCV: 89.2 fL (ref 80.0–100.0)
Platelets: 267 10*3/uL (ref 150–400)
RBC: 3.05 MIL/uL — ABNORMAL LOW (ref 3.87–5.11)
RDW: 13.8 % (ref 11.5–15.5)
WBC: 8.2 10*3/uL (ref 4.0–10.5)
nRBC: 0 % (ref 0.0–0.2)

## 2022-02-26 LAB — FERRITIN: Ferritin: 96 ng/mL (ref 11–307)

## 2022-02-26 LAB — IRON AND TIBC
Iron: 17 ug/dL — ABNORMAL LOW (ref 28–170)
Saturation Ratios: 7 % — ABNORMAL LOW (ref 10.4–31.8)
TIBC: 235 ug/dL — ABNORMAL LOW (ref 250–450)
UIBC: 218 ug/dL

## 2022-02-26 LAB — LIPASE, BLOOD: Lipase: 114 U/L — ABNORMAL HIGH (ref 11–51)

## 2022-02-26 MED ORDER — LACTATED RINGERS IV SOLN: INTRAVENOUS | Status: AC

## 2022-02-26 MED ORDER — DICLOFENAC SODIUM 1 % EX GEL
4.0000 g | Freq: Four times a day (QID) | CUTANEOUS | Status: DC | PRN
  Administered 2022-02-27 – 2022-02-28 (×2): 4 g via TOPICAL
  Filled 2022-02-26: qty 100

## 2022-02-26 MED ORDER — OXYCODONE HCL 5 MG PO TABS
5.0000 mg | ORAL_TABLET | ORAL | Status: DC | PRN
  Administered 2022-02-26 – 2022-03-03 (×19): 5 mg via ORAL
  Filled 2022-02-26 (×19): qty 1

## 2022-02-26 MED ORDER — CHLORHEXIDINE GLUCONATE CLOTH 2 % EX PADS
6.0000 | MEDICATED_PAD | Freq: Every day | CUTANEOUS | Status: DC
  Administered 2022-02-26 – 2022-03-03 (×6): 6 via TOPICAL

## 2022-02-26 MED ORDER — QUETIAPINE FUMARATE 25 MG PO TABS
25.0000 mg | ORAL_TABLET | Freq: Every day | ORAL | Status: DC
  Administered 2022-02-26 – 2022-03-02 (×5): 25 mg via ORAL
  Filled 2022-02-26 (×5): qty 1

## 2022-02-26 NOTE — Progress Notes (Signed)
Patient's family requesting medication to help with sleep to start tonight. Attending MD notified and new order received for Seroquel.

## 2022-02-26 NOTE — Progress Notes (Addendum)
Right foot ulcer post cast removal. Spoke with orthopedics and podiatry, need wound care.

## 2022-02-26 NOTE — Progress Notes (Signed)
Received CAM boot from pre-op area. Patient aware she is to be NWB to RLE without CAM boot in place.

## 2022-02-26 NOTE — Hospital Course (Addendum)
Laurie Hale is a 82 y.o. female with medical history significant of left ankle fracture on cast, hypertension, hyperlipidemia, CAD, hypothyroidism, GERD, depression, breast cancer, chronic back pain, who presents with hematuria, abdominal pain and fall.  Patient has across hematuria, urine culture was sent out, she is placed on IV antibiotics for UTI.  CT abdomen pelvis and a CT cystogram showed evidence of ruptured bladder, and bladder wall hematoma.  Urology is consulted, patient would prefer conservative treatment.  Foley catheter was anchored to decompress the bladder.  Urine culture came back with Proteus, patient is on Rocephin.  Can change to oral Keflex at time of discharge, but prefer to treat at least 10 days with antibiotics.  Nursing home bed available 1/18 and patient to be transferred.

## 2022-02-26 NOTE — Progress Notes (Signed)
  Progress Note   Patient: Laurie Hale:[id number] DOB: 1940/06/07 DOA: 02/25/2022     1 DOS: the patient was seen and examined on 02/26/2022   Brief hospital course: Laurie Hale is a 82 y.o. female with medical history significant of left ankle fracture on cast, hypertension, hyperlipidemia, CAD, hypothyroidism, GERD, depression, breast cancer, chronic back pain, who presents with hematuria, abdominal pain and fall.  Patient has across hematuria, urine culture was sent out, she is placed on IV antibiotics for UTI.  CT abdomen pelvis and a CT cystogram showed evidence of ruptured bladder, and bladder wall hematoma.  Urology is consulted, patient would prefer conservative treatment.  Foley catheter was anchored to decompress the bladder.  Assessment and Plan: Acute cystitis with hematuria. Ruptured urinary bladder. Fortunately, patient did not develop sepsis.  Currently she is hemodynamically stable.  Continue antibiotics with Rocephin, urine cultures pending.  She still have significant hematuria. Patient has been seen by urology, determined to continue with conservative treatment.  Foley catheter anchored to decompress the bladder.  Patient be followed closely.  Bladder hematoma. Acute blood loss anemia. Gross hematuria. Hemoglobin had dropped down to 8.7, continue monitor.  Check iron B12 level.  Transfuse as needed.  Acute kidney injury. Metabolic acidosis. Renal function has improved after IV fluids, CO2 level dropped down to 19.  Fluids will be continued needed with lactated Ringer solution.  Recheck BMP tomorrow.  Fall at home.   Chronic back pain. Recent right ankle fracture PT/OT. Repeat x-ray of right ankle still has displaced the fracture, ankle in cast.  Continue pain medicine as needed.  Consult orthopedics.  Abnormal LFTs. Continue to follow.  Hepatitis panel pending.  Essential hypertension. Coronary disease. Dyslipidemia. Continue home medicines.  Obesity with BMI  of 33.99. Diet and exercise.  Bacterial vaginosis. On Flagyl.     Subjective:  Patient still has significant hematuria, she also complained of right ankle pain.  Physical Exam: Vitals:   02/25/22 1725 02/25/22 2119 02/26/22 0645 02/26/22 0829  BP: (!) 157/65 119/66 (!) 134/50 (!) 157/82  Pulse: 86 85 87 92  Resp: '18 20 20 18  '$ Temp: 97.8 F (36.6 C) 99.4 F (37.4 C) 98.1 F (36.7 C) 98.3 F (36.8 C)  TempSrc:   Oral   SpO2: 98% 95% 95% 94%  Weight:      Height:       General exam: Appears calm and comfortable  Respiratory system: Clear to auscultation. Respiratory effort normal. Cardiovascular system: S1 & S2 heard, RRR. No JVD, murmurs, rubs, gallops or clicks. No pedal edema. Gastrointestinal system: Abdomen is nondistended, soft and nontender. No organomegaly or masses felt. Normal bowel sounds heard. Central nervous system: Alert and oriented. No focal neurological deficits. Extremities: Symmetric 5 x 5 power. Skin: No rashes, lesions or ulcers Psychiatry: Judgement and insight appear normal. Mood & affect appropriate.   Data Reviewed:  CT abdomen/pelvis, CT cystogram reviewed.  Lab results reviewed.  X-ray of ankle reviewed.  Family Communication: daughter updated.   Disposition: Status is: Inpatient Remains inpatient appropriate because: Severity of disease, IV treatment.  Planned Discharge Destination: Home with Home Health    Time spent: 50 minutes  Author: Sharen Hones, MD 02/26/2022 10:23 AM  For on call review [email or web address].

## 2022-02-26 NOTE — Consult Note (Addendum)
ORTHOPAEDIC CONSULTATION  REQUESTING PHYSICIAN: Sharen Hones, MD  Chief Complaint: Right bimalleolar ankle fracture  HPI: Laurie Hale is a 82 y.o. female who is admitted for hematuria, also with orthopedic history of a right bimalleolar ankle fracture approximately 6 weeks ago.  [facility] and [facility] into a cast in December and had an appointment scheduled on 112 for cast removal.  She missed the appointment as she was in the hospital.  Orthopedics was consulted for evaluation of her ankle fracture and for cast removal.  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, DCIS   Cataract    immature bilateral   Chronic back pain    Constipation    Coronary artery disease    Diverticulosis    GERD (gastroesophageal reflux disease)    takes a med daily-to bring with med name at surgery   History of migraine    hasn't had one in yrs-per pt Zoloft stopped them   Hyperlipidemia    takes Crestor and Niacin daily   Hypertension    takes Amlodipine  and Lisinopril daily   Hypokalemia    takes K Dur tid   Hypothyroidism    takes Synthroid daily   Insomnia    takes restoril prn   Joint pain    Joint swelling    Peripheral edema    takes HCTZ daily   Personal history of radiation therapy 2014   F/U lumpectomy    Presence of pessary    Past Surgical History:  Procedure Laterality Date   ABDOMINAL HYSTERECTOMY     BACK SURGERY  2009   BREAST BIOPSY Left 10/22/2012   Stereo - DCIS   BREAST LUMPECTOMY Left 2014   DCIS. F/U radiation    CARDIAC CATHETERIZATION  1999   CATARACT EXTRACTION W/PHACO Right 10/05/2015   Procedure: CATARACT EXTRACTION PHACO AND INTRAOCULAR LENS PLACEMENT (IOC);  Surgeon: Ronnell Freshwater, MD;  Location: [facility];  Service: Ophthalmology;  Laterality: Right;  RIGHT   CATARACT EXTRACTION W/PHACO Left 11/16/2015   Procedure: CATARACT EXTRACTION PHACO AND INTRAOCULAR LENS  PLACEMENT (IOC);  Surgeon: Ronnell Freshwater, MD;  Location: [facility];  Service: Ophthalmology;  Laterality: Left;  LEFT   COLONOSCOPY     CORONARY ANGIOPLASTY     1 stent   partial coloectomy  1995   right knee arthroscopy  2013   TONSILLECTOMY     TOTAL KNEE ARTHROPLASTY  01/02/2012   RIGHT KNEE   TOTAL KNEE ARTHROPLASTY  01/02/2012   Procedure: TOTAL KNEE ARTHROPLASTY;  Surgeon: Vickey Huger, MD;  Location: [facility];  Service: Orthopedics;  Laterality: Right;   TOTAL KNEE ARTHROPLASTY Left 10/09/2017   Procedure: LEFT TOTAL KNEE ARTHROPLASTY;  Surgeon: Vickey Huger, MD;  Location: [facility];  Service: Orthopedics;  Laterality: Left;  with block   Social History   Socioeconomic History   Marital status: Married    Spouse name: Not on file   Number of children: Not on file   Years of education: Not on file   Highest education level: Not on file  Occupational History   Not on file  Tobacco Use   Smoking status: Never   Smokeless tobacco: Never  Vaping Use   Vaping Use: Not on file  Substance and Sexual Activity   Alcohol use: No   Drug use: No   Sexual activity: Not Currently  Other Topics Concern  Not on file  Social History Narrative   Not on file   Social Determinants of Health   Financial Resource Strain: Not on file  Food Insecurity: No Food Insecurity (02/25/2022)   Hunger Vital Sign    Worried About Running Out of Food in the Last Year: Never true    Ran Out of Food in the Last Year: Never true  Transportation Needs: No Transportation Needs (02/25/2022)   PRAPARE - Hydrologist (Medical): No    Lack of Transportation (Non-Medical): No  Physical Activity: Not on file  Stress: Not on file  Social Connections: Not on file   Family History  Problem Relation Age of Onset   Hypertension Mother    Sudden death Sister        x2   Breast cancer Sister    Heart disease Brother    Allergies  Allergen Reactions    Ciprofloxacin    Penicillins Hives         Prior to Admission medications   Medication Sig Start Date End Date Taking? Authorizing Provider  amLODipine (NORVASC) 5 MG tablet Take 5 mg by mouth daily.   Yes [provider]  levothyroxine (SYNTHROID, LEVOTHROID) 88 MCG tablet Take 88 mcg by mouth at bedtime.   Yes [provider]  lisinopril (PRINIVIL,ZESTRIL) 40 MG tablet Take 1 tablet (40 mg total) by mouth daily. 01/31/14  Yes Einar Pheasant, MD  meloxicam (MOBIC) 7.5 MG tablet Take 7.5 mg by mouth daily.   Yes [provider]  morphine (MS CONTIN) 100 MG 12 hr tablet Take 100 mg by mouth every 8 (eight) hours as needed for pain.   Yes [provider]  morphine (MSIR) 15 MG tablet Take 15 mg by mouth every 6 (six) hours as needed (for pain).   Yes [provider]  rosuvastatin (CRESTOR) 20 MG tablet Take 20 mg by mouth every Monday, Wednesday, and Friday.   Yes [provider]  sertraline (ZOLOFT) 100 MG tablet Take 1 tablet (100 mg total) by mouth daily. Patient taking differently: Take 150 mg by mouth daily. Take one and a half (1.5) tablets by mouth once daily 07/11/13  Yes Einar Pheasant, MD  aspirin 325 MG EC tablet Take 1 tablet (325 mg total) by mouth 2 (two) times daily. Patient not taking: Reported on 02/25/2022 10/10/17   Donia Ast, [place]   DG Ankle 2 Views Right  Result Date: 02/26/2022 CLINICAL DATA:  Fracture EXAM: RIGHT ANKLE - 2 VIEW COMPARISON:  None Available. FINDINGS: Images through cast demonstrate oblique fracture lateral malleolus and transverse fracture medial malleolus displaced by several mm. Fine bony details obscured by the overlying cast. IMPRESSION: Ankle eversion injury with fractures medial and lateral malleolus. Electronically Signed   By: Sammie Bench M.D.   On: 02/26/2022 09:51   CT CYSTOGRAM PELVIS  Result Date: 02/25/2022 CLINICAL DATA:  Fall out of bed. Abnormal CT of the abdomen and pelvis.  EXAM: CT CYSTOGRAM (CT PELVIS WITH CONTRAST) TECHNIQUE: Multidetector CT imaging through the pelvis was performed after dilute contrast had been introduced into the bladder for the purposes of performing CT cystography. RADIATION DOSE REDUCTION: This exam was performed according to the departmental dose-optimization program which includes automated exposure control, adjustment of the mA and/or kV according to patient size and/or use of iterative reconstruction technique. CONTRAST:  250 mL Cysto-Conray was instilled via a Foley catheter. COMPARISON:  CT of the abdomen pelvis 02/25/2022 FINDINGS: Urinary Tract:  Bladder wall rupture is noted superiorly into the left. Hyperdense contrast is seen at the rupture site with more diluted contrast collecting about the urinary bladder. Filling defect is noted more superiorly in the urinary bladder than on the prior exam compatible with hematoma. No mass lesion is present. Bowel:  Unremarkable. Vascular/Lymphatic: Atherosclerotic calcifications again noted. No significant retroperitoneal adenopathy. Right inguinal node again noted. Reproductive:  Status post hysterectomy.  No adnexal mass. Musculoskeletal: Postoperative changes are present in the lumbar spine. No acute fractures are present. IMPRESSION: 1. Acute superior bladder wall rupture with extravasation of contrast. 2. Filling defect more superiorly in the urinary bladder than on the prior exam compatible with hematoma. Critical Value/emergent results were called by telephone at the time of interpretation on 02/25/2022 at 2:23 pm to provider Dr. Charna Archer, ED physician, who verbally acknowledged these results. Electronically Signed   By: San Morelle M.D.   On: 02/25/2022 14:24   CT Abdomen Pelvis W Contrast  Addendum Date: 02/25/2022   ADDENDUM REPORT: 02/25/2022 12:40 ADDENDUM: Upon further review, in the setting of trauma, bladder rupture is also within the differential and can be evaluated with cystogram.  Electronically Signed   By: Darrin Nipper M.D.   On: 02/25/2022 12:40   Result Date: 02/25/2022 CLINICAL DATA:  Fall out of bed with chronic back pain EXAM: CT ABDOMEN AND PELVIS WITH CONTRAST TECHNIQUE: Multidetector CT imaging of the abdomen and pelvis was performed using the standard protocol following bolus administration of intravenous contrast. RADIATION DOSE REDUCTION: This exam was performed according to the departmental dose-optimization program which includes automated exposure control, adjustment of the mA and/or kV according to patient size and/or use of iterative reconstruction technique. CONTRAST:  59m OMNIPAQUE IOHEXOL 300 MG/ML  SOLN COMPARISON:  CT lumbar spine dated 02/03/2017 FINDINGS: Lower chest: No focal consolidation or pulmonary nodule in the lung bases. No pleural effusion or pneumothorax demonstrated. Partially imaged heart size is normal. Coronary artery calcifications. Hepatobiliary: No focal hepatic lesions. No intra or extrahepatic biliary ductal dilation. Normal gallbladder. Pancreas: No focal lesions or main ductal dilation. Spleen: Subcentimeter hypoattenuating focus along the peripheral lateral spleen (3:16). Adrenals/Urinary Tract: No adrenal nodules. No suspicious renal mass, calculi or hydronephrosis. Circumferential mural thickening with surrounding stranding and free fluid. Hyperattenuating, masslike lobulation along the posterior bladder wall measures 3.6 x 1.8 cm (3:71). Stomach/Bowel: Small hiatal hernia. Normal appearance of the stomach. Duodenal diverticulum along the second portion. Mobile cecum in the right upper quadrant. No evidence of bowel wall thickening, distention, or inflammatory changes. Rectosigmoid anastomosis is patent. Colonic diverticulosis without acute diverticulitis. Status post appendectomy. Vascular/Lymphatic: Aortic atherosclerosis. Right inguinal lymph node measures 13 mm (3:76). Reproductive: No adnexal masses. Other: Small volume free fluid  surrounding the urinary bladder. No fluid collection or free air. Musculoskeletal: No acute or abnormal lytic or blastic osseous lesions. Postsurgical changes from spinal fixation spanning T11-S1. Multilevel degenerative changes of the partially imaged thoracic and lumbar spine. Asymmetrically expanded appearance of the left psoas muscle, new from 02/03/2017, with associated adjacent stranding. IMPRESSION: 1. Circumferential mural thickening of the urinary bladder with surrounding stranding and free fluid. Hyperattenuating, masslike lobulation along the posterior bladder wall measures 3.6 x 1.8 cm. While this finding may reflect layering hyperattenuating debris or blood products in the setting of acute cystitis, findings are suspicious for bladder malignancy. Recommend correlation with urinalysis and ultrasound examination of the bladder to confirm mass lesion. 2. Asymmetrically expanded left psoas muscle may reflect intramuscular hematoma in the setting of recent  fall. 3. Subcentimeter hypoattenuating focus along the peripheral lateral spleen, too small to characterize. 4. Right inguinal lymph node measures 13 mm, indeterminate. 5. Small hiatal hernia. 6. Colonic diverticulosis without acute diverticulitis. 7. Coronary artery calcifications. Aortic Atherosclerosis (ICD10-I70.0). Electronically Signed: By: Darrin Nipper M.D. On: 02/25/2022 09:27   CT L-SPINE NO CHARGE  Result Date: 02/25/2022 CLINICAL DATA:  Chronic back pain.  Presenting after fall out of bed EXAM: CT Lumbar spine with contrast TECHNIQUE: Multiplanar CT images of the lumbar spine were reconstructed from contemporary CT of the Abdomen and Pelvis. RADIATION DOSE REDUCTION: This exam was performed according to the departmental dose-optimization program which includes automated exposure control, adjustment of the mA and/or kV according to patient size and/or use of iterative reconstruction technique. CONTRAST:  No additional COMPARISON:  CT lumbar  spine dated 02/03/2017 FINDINGS: CT LUMBAR SPINE FINDINGS Segmentation: 5 lumbar type vertebrae. Alignment: Straightened status post posterior spinal fixation spanning T11-S1. Hardware appears intact. Vertebrae: No acute fracture or focal pathologic process. Paraspinal and other soft tissues: Aortic atherosclerosis. Small hiatal hernia. Asymmetrically expanded left psoas muscle. Partially imaged bladder wall thickening and surrounding edema. Disc levels: Similar multilevel intervertebral disc space narrowing of the imaged thoracolumbar spine. IMPRESSION: 1. No acute fracture or traumatic listhesis of the lumbar spine. 2. Asymmetrically expanded left psoas muscle, which may represent an intramuscular hematoma. 3. Partially imaged bladder wall thickening and surrounding edema, better evaluated on concurrent CT abdomen and pelvis. 4. Small hiatal hernia. 5. Aortic Atherosclerosis (ICD10-I70.0). Electronically Signed   By: Darrin Nipper M.D.   On: 02/25/2022 09:33   CT Head Wo Contrast  Result Date: 02/25/2022 CLINICAL DATA:  Trauma EXAM: CT HEAD WITHOUT CONTRAST CT CERVICAL SPINE WITHOUT CONTRAST TECHNIQUE: Multidetector CT imaging of the head and cervical spine was performed following the standard protocol without intravenous contrast. Multiplanar CT image reconstructions of the cervical spine were also generated. RADIATION DOSE REDUCTION: This exam was performed according to the departmental dose-optimization program which includes automated exposure control, adjustment of the mA and/or kV according to patient size and/or use of iterative reconstruction technique. 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 There is sequela of mild chronic microvascular ischemic change. No evidence of hemorrhage, hydrocephalus, extra-axial collection or mass lesion/mass effect.  Vascular: No hyperdense vessel or unexpected calcification. Skull: Normal. Negative for fracture or focal lesion. Sinuses/Orbits: No mastoid or middle ear effusion. Paranasal sinuses are clear. Bilateral lens replacement. Bilateral orbits are otherwise unremarkable. Other: None. CT CERVICAL SPINE FINDINGS Alignment: Grade 1 anterolisthesis of C3 on C4 and C7 on T1. This is new compared to 2005. Skull base and vertebrae: Postsurgical changes from C5-C7 ACDF with solid osseous fusion C5-C6 incomplete fusion at C6-C7. There is also fusion at C4-C5. Soft tissues and spinal canal: No prevertebral fluid or swelling. No visible canal hematoma. Disc levels:  No CT evidence of high-grade spinal canal stenosis Upper chest: Negative. Other: None IMPRESSION: 1. No acute intracranial abnormality. 2. 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 3. No acute cervical spine fracture. Electronically Signed   By: Marin Roberts M.D.   On: 02/25/2022 09:26   CT Cervical Spine Wo Contrast  Result Date: 02/25/2022 CLINICAL DATA:  Trauma EXAM: CT HEAD WITHOUT CONTRAST CT CERVICAL SPINE WITHOUT CONTRAST TECHNIQUE: Multidetector CT imaging of the head  and cervical spine was performed following the standard protocol without intravenous contrast. Multiplanar CT image reconstructions of the cervical spine were also generated. RADIATION DOSE REDUCTION: This exam was performed according to the departmental dose-optimization program which includes automated exposure control, adjustment of the mA and/or kV according to patient size and/or use of iterative reconstruction technique. 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 There is sequela of mild chronic microvascular ischemic change. No evidence of hemorrhage, hydrocephalus, extra-axial collection or  mass lesion/mass effect. Vascular: No hyperdense vessel or unexpected calcification. Skull: Normal. Negative for fracture or focal lesion. Sinuses/Orbits: No mastoid or middle ear effusion. Paranasal sinuses are clear. Bilateral lens replacement. Bilateral orbits are otherwise unremarkable. Other: None. CT CERVICAL SPINE FINDINGS Alignment: Grade 1 anterolisthesis of C3 on C4 and C7 on T1. This is new compared to 2005. Skull base and vertebrae: Postsurgical changes from C5-C7 ACDF with solid osseous fusion C5-C6 incomplete fusion at C6-C7. There is also fusion at C4-C5. Soft tissues and spinal canal: No prevertebral fluid or swelling. No visible canal hematoma. Disc levels:  No CT evidence of high-grade spinal canal stenosis Upper chest: Negative. Other: None IMPRESSION: 1. No acute intracranial abnormality. 2. 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 3. No acute cervical spine fracture. Electronically Signed   By: Marin Roberts M.D.   On: 02/25/2022 09:26    Positive ROS: All other systems have been reviewed and were otherwise negative with the exception of those mentioned in the HPI and as above.  Physical Exam: General: Alert, no acute distress Cardiovascular: No pedal edema Respiratory: No cyanosis, no use of accessory musculature GI: No organomegaly, abdomen is soft and non-tender Skin: No lesions in the area of chief complaint Neurologic: Sensation intact distally Psychiatric: Patient is competent for consent with normal mood and affect Lymphatic: No axillary or cervical lymphadenopathy  MUSCULOSKELETAL:   Right dorsal foot/ankle with ulceration from the cast  Assessment: Mildly displaced left bimalleolar ankle fracture, date of injury approximately 6 weeks prior; cast removed today with areas of deep ulceration of the dorsal foot/ankle  Plan: Recommend evaluation with podiatry and or wound care.  From an orthopedic standpoint with respect to  bone healing, patient is okay to start progressive weightbearing with the use of the cam boot.  Recommend repeat x-rays of the ankle on an outpatient basis in approximately 6 weeks.    Renee Harder, MD    02/26/2022 11:37 AM

## 2022-02-26 NOTE — Progress Notes (Signed)
Radiology at bedside performing ordered xray

## 2022-02-26 NOTE — Progress Notes (Signed)
Neurology progress  Subjective: Doing well today, no longer having any abdominal pain.  Passing flatus, no nausea or vomiting.  No fevers or chills.  Daily Foley Less bloody today per the patient report.  Family at bedside.    Objective: Vitals:   02/26/22 0645 02/26/22 0829  BP: (!) 134/50 (!) 157/82  Pulse: 87 92  Resp: 20 18  Temp: 98.1 F (36.7 C) 98.3 F (36.8 C)  SpO2: 95% 94%   Alert and oriented. Abdomen is soft minimally tender, nondistended Foley catheter is in place, initially kinked and when straightened, drained at least 150 cc.  Urine is light maroon with scant debris.  Assessment and plan:  1.  Bladder injury-traumatic, likely somewhat chronic based on history and appearance on CT scan.   She has elected conservative management and seems to be doing well with this.  Creatinine has normalized.  Hemoglobin is trending somewhat downwards but certainly there is a delusional component.  Will continue to trend this.  Urine appears to be clearing.  At this point in time, no signs of systemic infection, peritonitis, or bowel complications.  Continue conservative management, ensure Foley continues to drain well.  Flush as needed very gently but be mindful of sterile technique in light of injury.  Maintain the Foley catheter for at least 2 weeks followed by cystogram.  Hollice Espy, MD

## 2022-02-27 DIAGNOSIS — N3289 Other specified disorders of bladder: Secondary | ICD-10-CM | POA: Diagnosis not present

## 2022-02-27 DIAGNOSIS — L89893 Pressure ulcer of other site, stage 3: Secondary | ICD-10-CM | POA: Insufficient documentation

## 2022-02-27 DIAGNOSIS — S3722XA Contusion of bladder, initial encounter: Secondary | ICD-10-CM | POA: Diagnosis not present

## 2022-02-27 DIAGNOSIS — N3001 Acute cystitis with hematuria: Secondary | ICD-10-CM | POA: Diagnosis not present

## 2022-02-27 LAB — BASIC METABOLIC PANEL
Anion gap: 8 (ref 5–15)
BUN: 29 mg/dL — ABNORMAL HIGH (ref 8–23)
CO2: 21 mmol/L — ABNORMAL LOW (ref 22–32)
Calcium: 8.3 mg/dL — ABNORMAL LOW (ref 8.9–10.3)
Chloride: 108 mmol/L (ref 98–111)
Creatinine, Ser: 0.93 mg/dL (ref 0.44–1.00)
GFR, Estimated: >60 mL/min (ref >60)
Glucose, Bld: 129 mg/dL — ABNORMAL HIGH (ref 70–99)
Potassium: 3.7 mmol/L (ref 3.5–5.1)
Sodium: 137 mmol/L (ref 135–145)

## 2022-02-27 LAB — URINE CULTURE: Culture: >=100000 — AB

## 2022-02-27 LAB — CBC
HCT: 29 % — ABNORMAL LOW (ref 36.0–46.0)
Hemoglobin: 9.1 g/dL — ABNORMAL LOW (ref 12.0–15.0)
MCH: 28.3 pg (ref 26.0–34.0)
MCHC: 31.4 g/dL (ref 30.0–36.0)
MCV: 90.3 fL (ref 80.0–100.0)
Platelets: 248 10*3/uL (ref 150–400)
RBC: 3.21 MIL/uL — ABNORMAL LOW (ref 3.87–5.11)
RDW: 13.9 % (ref 11.5–15.5)
WBC: 8.9 10*3/uL (ref 4.0–10.5)
nRBC: 0 % (ref 0.0–0.2)

## 2022-02-27 LAB — HEPATITIS PANEL, ACUTE
HCV Ab: NONREACTIVE
Hep A IgM: NONREACTIVE
Hep B C IgM: NONREACTIVE
Hepatitis B Surface Ag: NONREACTIVE

## 2022-02-27 LAB — HCV INTERPRETATION

## 2022-02-27 LAB — MAGNESIUM: Magnesium: 2.1 mg/dL (ref 1.7–2.4)

## 2022-02-27 LAB — VITAMIN B12: Vitamin B-12: 533 pg/mL (ref 180–914)

## 2022-02-27 MED ORDER — SODIUM CHLORIDE 0.9 % IV SOLN
300.0000 mg | Freq: Once | INTRAVENOUS | Status: AC
  Administered 2022-02-27: 300 mg via INTRAVENOUS
  Filled 2022-02-27: qty 300

## 2022-02-27 MED ORDER — AMLODIPINE BESYLATE 10 MG PO TABS
10.0000 mg | ORAL_TABLET | Freq: Every day | ORAL | Status: DC
  Administered 2022-02-28 – 2022-03-03 (×4): 10 mg via ORAL
  Filled 2022-02-27 (×4): qty 1

## 2022-02-27 NOTE — Evaluation (Signed)
Occupational Therapy Evaluation Patient Details Name: Laurie Hale MRN: [id number] DOB: 04/17/1940 Today's Date: 02/27/2022   History of Present Illness Laurie Hale is an 40yoF who comes to [facility] on 02/25/22 c hematuria, ABD pain and fall at home, admitted with UTI. Pt still in cast on RLE s/p ankle fracture 6 weeks prior.   Clinical Impression   Patient presenting with decreased independence in self care, balance, functional mobility/transfers, endurance, and safety awareness. PTA pt lived with son (works during the day), was Mod I for ADLs, and received assistance for IADLs. Pt currently functioning at [place] A for stand pivot transfer to/from [facility] using RW, Max A for LB dressing, and Min A for posterior hygiene. Pt will benefit from acute OT to increase overall independence in the areas of ADLs and functional mobility in order to safely discharge to next venue of care. Upon hospital discharge, recommend STR to maximize pt safety and return to PLOF.    Recommendations for follow up therapy are one component of a multi-disciplinary discharge planning process, led by the attending physician.  Recommendations may be updated based on patient status, additional functional criteria and insurance authorization.   Follow Up Recommendations  Skilled nursing-short term rehab (<3 hours/day)     Assistance Recommended at Discharge Frequent or constant Supervision/Assistance  Patient can return home with the following A lot of help with walking and/or transfers;A lot of help with bathing/dressing/bathroom;Assistance with cooking/housework;Assist for transportation;Help with stairs or ramp for entrance;Direct supervision/assist for medications management    Functional Status Assessment  Patient has had a recent decline in their functional status and demonstrates the ability to make significant improvements in function in a reasonable and predictable amount of time.  Equipment Recommendations  Other (comment)  (defer to next venue of care)    Recommendations for Other Services       Precautions / Restrictions Precautions Precautions: Fall Restrictions Weight Bearing Restrictions: Yes RLE Weight Bearing: Weight bearing as tolerated (only with use of camboot)      Mobility Bed Mobility               General bed mobility comments: NT, pt received/left in recliner    Transfers Overall transfer level: Needs assistance Equipment used: Rolling walker (2 wheels) Transfers: Bed to chair/wheelchair/BSC   Stand pivot transfers: Min assist                Balance Overall balance assessment: Needs assistance Sitting-balance support: Feet supported Sitting balance-Leahy Scale: Good     Standing balance support: Bilateral upper extremity supported, During functional activity, Reliant on assistive device for balance Standing balance-Leahy Scale: Fair                             ADL either performed or assessed with clinical judgement   ADL Overall ADL's : Needs assistance/impaired                     Lower Body Dressing: Maximal assistance;Sitting/lateral leans Lower Body Dressing Details (indicate cue type and reason): anticipate for R camboot (already wearing) and L sock Toilet Transfer: Stand-pivot;BSC/3in1;Minimal assistance;Rolling walker (2 wheels);Cueing for safety;Cueing for sequencing   Toileting- Clothing Manipulation and Hygiene: Minimal assistance;Sitting/lateral lean;Sit to/from stand;Maximal assistance Toileting - Clothing Manipulation Details (indicate cue type and reason): pt attempting posterior hygiene in sitting (Min A for thoroughness), Max A for clothing management in standing  Vision Baseline Vision/History: 1 Wears glasses (readers) Patient Visual Report: No change from baseline       Perception     Praxis      Pertinent Vitals/Pain Pain Assessment Pain Assessment: Faces Faces Pain Scale: Hurts little  more Pain Location: RLE Pain Descriptors / Indicators: Grimacing, Guarding, Sore Pain Intervention(s): Limited activity within patient's tolerance, Monitored during session, Premedicated before session     Hand Dominance Right   Extremity/Trunk Assessment Upper Extremity Assessment Upper Extremity Assessment: Generalized weakness   Lower Extremity Assessment Lower Extremity Assessment: Generalized weakness       Communication Communication Communication: No difficulties   Cognition Arousal/Alertness: Awake/alert Behavior During Therapy: Anxious, WFL for tasks assessed/performed Overall Cognitive Status: No family/caregiver present to determine baseline cognitive functioning                                 General Comments: possibly some mild confusion, repeating questions. Oriented to self, month, and year.     General Comments       Exercises Other Exercises Other Exercises: OT provided education re: role of OT, OT POC, post acute recs, sitting up for all meals, EOB/OOB mobility with assistance, home/fall safety, RLE WBAT with camboot   Shoulder Instructions      Home Living Family/patient expects to be discharged to:: Private residence Living Arrangements: Children (Son works during day) Available Help at Discharge: Family;Available PRN/intermittently Type of Home: House Home Access: Level entry     Home Layout: One level     Bathroom Shower/Tub: Occupational psychologist: Handicapped height Bathroom Accessibility: No   Home Equipment: Conservation officer, nature (2 wheels);Wheelchair - manual;Shower seat - built in;Grab bars - toilet          Prior Functioning/Environment Prior Level of Function : History of Falls (last six months);Independent/Modified Independent             Mobility Comments: Has been "NWB" with RW for 6 weeks with ankle fracture in cast. Prior to that household amb with RW due balance and endurance limitations ADLs  Comments: Mod I ADLs, assist with IADLs (son or daughter provide transportation to appointments and get groceries)        OT Problem List: Decreased strength;Decreased range of motion;Decreased activity tolerance;Impaired balance (sitting and/or standing);Pain;Decreased safety awareness;Decreased knowledge of precautions;Decreased knowledge of use of DME or AE;Decreased cognition      OT Treatment/Interventions: Self-care/ADL training;Therapeutic exercise;Therapeutic activities;Energy conservation;DME and/or AE instruction;Patient/family education;Balance training;Cognitive remediation/compensation    OT Goals(Current goals can be found in the care plan section) Acute Rehab OT Goals Patient Stated Goal: get stronger, return home OT Goal Formulation: With patient Time For Goal Achievement: 03/13/22 Potential to Achieve Goals: Good   OT Frequency: Min 2X/week    Co-evaluation              AM-PAC OT "6 Clicks" Daily Activity     Outcome Measure Help from another person eating meals?: None Help from another person taking care of personal grooming?: A Little Help from another person toileting, which includes using toliet, bedpan, or urinal?: A Lot Help from another person bathing (including washing, rinsing, drying)?: A Lot Help from another person to put on and taking off regular upper body clothing?: None Help from another person to put on and taking off regular lower body clothing?: A Lot 6 Click Score: 17   End of Session Equipment Utilized During Treatment: Gait  belt;Rolling walker (2 wheels);Other (comment) (R camboot) Nurse Communication: Mobility status  Activity Tolerance: Patient tolerated treatment well;Patient limited by fatigue;Patient limited by pain Patient left: in chair;with call bell/phone within reach;with chair alarm set  OT Visit Diagnosis: Unsteadiness on feet (R26.81);Muscle weakness (generalized) (M62.81);Pain;History of falling (Z91.81) Pain - Right/Left:  Right Pain - part of body: Leg                Time: 3810-1751 OT Time Calculation (min): 28 min Charges:  OT General Charges $OT Visit: 1 Visit OT Evaluation $OT Eval Moderate Complexity: 1 Mod  [facility] MS, OTR/L ascom [phone]  02/27/22, 3:20 PM

## 2022-02-27 NOTE — Progress Notes (Signed)
  Progress Note   Patient: Laurie Hale JIR:[id number] DOB: 1940/12/31 DOA: 02/25/2022     2 DOS: the patient was seen and examined on 02/27/2022   Brief hospital course: KELLYJO EDGREN is a 82 y.o. female with medical history significant of left ankle fracture on cast, hypertension, hyperlipidemia, CAD, hypothyroidism, GERD, depression, breast cancer, chronic back pain, who presents with hematuria, abdominal pain and fall.  Patient has across hematuria, urine culture was sent out, she is placed on IV antibiotics for UTI.  CT abdomen pelvis and a CT cystogram showed evidence of ruptured bladder, and bladder wall hematoma.  Urology is consulted, patient would prefer conservative treatment.  Foley catheter was anchored to decompress the bladder.  Assessment and Plan: Acute cystitis with hematuria. Ruptured urinary bladder. Fortunately, patient did not develop sepsis.  Currently she is hemodynamically stable.  Patient has been seen by urology, determined to continue with conservative treatment.  Foley catheter anchored to decompress the bladder.  Patient be followed closely. Urine culture growth Proteus, continue Rocephin, pending final results.  Bladder hematoma. Acute blood loss anemia. Iron deficient anemia. Gross hematuria. Patient has a significant iron deficiency, will give IV iron, B12 level normal.  Hemoglobin is better today.  Continue to follow.   Acute kidney injury. Metabolic acidosis. Renal function has normalized, metabolic acidosis improving.  Will continue IV fluids until the evening time then discontinue.   Fall at home.   Chronic back pain. Recent right ankle fracture Right foot pressure ulcer stage 3 due to cast. PT/OT. Repeat x-ray of right ankle still has displaced the fracture, cast removed by orthopedics, patient developed stage III pressure ulcer on right foot, discussed with podiatry and orthopedics, patient will be followed by wound care.   Abnormal LFTs. Hepatitis  panel pending.   Essential hypertension. Coronary disease. Dyslipidemia. Increased amlodipine.    Obesity with BMI of 33.99. Diet and exercise.       Subjective:  Patient complaining of back pain after sleeping in hospital bed.  No short of breath.  No abdominal pain.  Physical Exam: Vitals:   02/26/22 2134 02/27/22 0536 02/27/22 0932 02/27/22 0932  BP: (!) 156/62 (!) 188/86 (!) 131/106 (!) 131/106  Pulse: 86 82  81  Resp: '20 18  16  '$ Temp: 98.8 F (37.1 C) 98 F (36.7 C)  98.2 F (36.8 C)  TempSrc:      SpO2: 96% 93%  94%  Weight:      Height:       General exam: Appears calm and comfortable  Respiratory system: Clear to auscultation. Respiratory effort normal. Cardiovascular system: S1 & S2 heard, RRR. No JVD, murmurs, rubs, gallops or clicks. No pedal edema. Gastrointestinal system: Abdomen is nondistended, soft and nontender. No organomegaly or masses felt. Normal bowel sounds heard. Central nervous system: Alert and oriented. No focal neurological deficits. Extremities: Right foot ulcer. Skin: No rashes, lesions or ulcers Psychiatry: Judgement and insight appear normal. Mood & affect appropriate.   Data Reviewed:  Lab results reviewed.  Family Communication: daughter updated  Disposition: Status is: Inpatient Remains inpatient appropriate because: Severity of disease, IV treatment.  Planned Discharge Destination: Home with Home Health    Time spent: 35 minutes  Author: Sharen Hones, MD 02/27/2022 11:49 AM  For on call review [email or web address].

## 2022-02-27 NOTE — Evaluation (Signed)
Physical Therapy Evaluation Patient Details Name: Laurie Hale MRN: [id number] DOB: [birth year] Today's Date: 02/27/2022  History of Present Illness  Laurie Hale is an 45yoF who comes to [facility] on 02/25/22 c hematuria, ABD pain and fall at home, admitted with UTI. Pt still in cast on RLE s/p ankle fracture 6 weeks prior.  Clinical Impression  Pt in bed on entry, CAM rocker still in bag on windowsill. Pt agreeable to session. Assisted with CAM rocker placement which is made difficult by A/ROM limitations in dorsiflexion, however, once donned, pt seems to tolerate adequately. Pt remains globally weak, struggles with coming to standing without minA at hips, also anxious about sustaining a fall. Pt ultimately is able to maintain balance with RW and successfully step pivot to recliner. Pt is home alone during the day, needs to be ambulatory and safe with RW for safe return to home. It is possible that this will improve as ABX take affect. Will continue to follow.          Recommendations for follow up therapy are one component of a multi-disciplinary discharge planning process, led by the attending physician.  Recommendations may be updated based on patient status, additional functional criteria and insurance authorization.  Follow Up Recommendations Skilled nursing-short term rehab (<3 hours/day) Can patient physically be transported by private vehicle: Yes    Assistance Recommended at Discharge Set up Supervision/Assistance  Patient can return home with the following  A lot of help with walking and/or transfers;Help with stairs or ramp for entrance;Assistance with cooking/housework;Assist for transportation;Assistance with feeding;Direct supervision/assist for financial management    Equipment Recommendations None recommended by PT  Recommendations for Other Services       Functional Status Assessment Patient has had a recent decline in their functional status and demonstrates the ability to make  significant improvements in function in a reasonable and predictable amount of time.     Precautions / Restrictions Precautions Precautions: Fall Restrictions Weight Bearing Restrictions: Yes RLE Weight Bearing: Weight bearing as tolerated (only with use of CAM ROCKER)      Mobility  Bed Mobility Overal bed mobility: Needs Assistance Bed Mobility: Supine to Sit     Supine to sit: Supervision     General bed mobility comments: considerable effort, moderate pain, but achieves goal of task    Transfers Overall transfer level: Needs assistance Equipment used: Rolling walker (2 wheels) Transfers: Sit to/from Stand, Bed to chair/wheelchair/BSC Sit to Stand: Min assist, From elevated surface   Step pivot transfers: Min guard            Ambulation/Gait                  Stairs            Wheelchair Mobility    Modified Rankin (Stroke Patients Only)       Balance                                             Pertinent Vitals/Pain Pain Assessment Pain Assessment: 0-10 Pain Score: 7  Pain Location: chronic left leg pain , whole leg Pain Descriptors / Indicators: Aching Pain Intervention(s): Limited activity within patient's tolerance, Monitored during session, Premedicated before session    Home Living Family/patient expects to be discharged to:: Private residence Living Arrangements: Children (Son , works during day) Available Help at Discharge: Family Type  of Home: House Home Access: Level entry       Home Layout: One level Home Equipment: Conservation officer, nature (2 wheels);Wheelchair - Brewing technologist - built in      Prior Function               Mobility Comments: Has been "NWB" with RW for 6 weeks with ankle fracture in cast. Prior to that household amb with RW due balance and enduracne limitations       Hand Dominance        Extremity/Trunk Assessment   Upper Extremity Assessment Upper Extremity Assessment:  Generalized weakness;Overall [facility] for tasks assessed    Lower Extremity Assessment Lower Extremity Assessment: Generalized weakness;Overall WFL for tasks assessed       Communication   Communication: No difficulties  Cognition Arousal/Alertness: Awake/alert Behavior During Therapy: Anxious, WFL for tasks assessed/performed (some repetitive speech) Overall Cognitive Status: Within Functional Limits for tasks assessed                                          General Comments      Exercises Other Exercises Other Exercises: SAQ 2x10 bilat Other Exercises: CAM rocker donning   Assessment/Plan    PT Assessment Patient needs continued PT services  PT Problem List Decreased strength;Decreased range of motion;Decreased activity tolerance;Decreased balance;Decreased mobility;Decreased coordination;Decreased cognition       PT Treatment Interventions DME instruction;Gait training;Stair training;Functional mobility training;Therapeutic activities;Therapeutic exercise;Balance training;Neuromuscular re-education;Cognitive remediation    PT Goals (Current goals can be found in the Care Plan section)  Acute Rehab PT Goals Patient Stated Goal: regain strength and AMB status for home PT Goal Formulation: With patient Time For Goal Achievement: 03/13/22 Potential to Achieve Goals: Good    Frequency Min 2X/week     Co-evaluation               AM-PAC PT "6 Clicks" Mobility  Outcome Measure Help needed turning from your back to your side while in a flat bed without using bedrails?: A Little Help needed moving from lying on your back to sitting on the side of a flat bed without using bedrails?: A Little Help needed moving to and from a bed to a chair (including a wheelchair)?: A Lot Help needed standing up from a chair using your arms (e.g., wheelchair or bedside chair)?: A Lot Help needed to walk in hospital room?: A Lot Help needed climbing 3-5 steps with a  railing? : A Lot 6 Click Score: 14    End of Session   Activity Tolerance: Patient tolerated treatment well;Patient limited by fatigue;Patient limited by pain Patient left: in chair;with chair alarm set;with call bell/phone within reach Nurse Communication: Mobility status PT Visit Diagnosis: Difficulty in walking, not elsewhere classified (R26.2);Other abnormalities of gait and mobility (R26.89)    Time: 8119-1478 PT Time Calculation (min) (ACUTE ONLY): 38 min   Charges:   PT Evaluation $PT Eval Moderate Complexity: 1 Mod PT Treatments $Therapeutic Exercise: 8-22 mins       1:09 PM, 02/27/22 Etta Grandchild, PT, DPT Physical Therapist - [facility]  [phone] ([place])    Azarria Balint C 02/27/2022, 12:55 PM

## 2022-02-27 NOTE — Consult Note (Signed)
WOC Nurse Consult Note: Reason for Consult:right dorsal foot/ankle pressure injury, full thickness related to cast. Stage 3.  Wound type:Pressure, medial device Pressure Injury POA: Yes Measurement:To be obtained by bedside RN and documented on Nursing flow sheet with next dressing application today Wound bed:see photo provided at time of cast removal by Dr. Sharlet Salina Drainage (amount, consistency, odor) small, serous Periwound:erythematous Dressing procedure/placement/frequency:  Discussed with Hospitalist, Dr. Roosevelt Locks, who discussed POC with Podiatric medicine yesterday and who deferred to wound care.   Recommend referral to outpatient wound care center for continued follow up post discharge. If you agree, please order/refer.  I will provide\ guidance for nursing for the topical care of this wound while in house using a daily cleanse followed by application of xeroform antimicrobial nonadherent gauze for atraumatic removal, autolytic debridement of nonviable tissue and donation of antimicrobial properties.This is to be topped with dry gauze and secured with a few turns of Kerlix roll gauze/paper tape and changed daily. Heels are to be floated. A silicone foam is to be placed for PI prevention.  The [facility] nursing team will not follow, but will remain available to this patient, the nursing and medical teams.  Please re-consult if needed.  Thank you for inviting [place] to participate in this patient's Plan of Care.  Maudie Flakes, MSN, RN, CNS, [place], Serita Grammes, [employer], [employer] phone:  [phone]

## 2022-02-28 ENCOUNTER — Other Ambulatory Visit: Payer: Self-pay | Admitting: Physician Assistant

## 2022-02-28 ENCOUNTER — Inpatient Hospital Stay: Payer: Medicare Other

## 2022-02-28 DIAGNOSIS — R310 Gross hematuria: Secondary | ICD-10-CM | POA: Diagnosis not present

## 2022-02-28 DIAGNOSIS — E669 Obesity, unspecified: Secondary | ICD-10-CM | POA: Diagnosis not present

## 2022-02-28 DIAGNOSIS — M79605 Pain in left leg: Secondary | ICD-10-CM | POA: Diagnosis not present

## 2022-02-28 DIAGNOSIS — N3289 Other specified disorders of bladder: Secondary | ICD-10-CM | POA: Diagnosis not present

## 2022-02-28 DIAGNOSIS — S3729XA Other injury of bladder, initial encounter: Secondary | ICD-10-CM | POA: Diagnosis not present

## 2022-02-28 DIAGNOSIS — N3001 Acute cystitis with hematuria: Secondary | ICD-10-CM | POA: Insufficient documentation

## 2022-02-28 LAB — BASIC METABOLIC PANEL
Anion gap: 8 (ref 5–15)
BUN: 18 mg/dL (ref 8–23)
CO2: 20 mmol/L — ABNORMAL LOW (ref 22–32)
Calcium: 8.2 mg/dL — ABNORMAL LOW (ref 8.9–10.3)
Chloride: 108 mmol/L (ref 98–111)
Creatinine, Ser: 0.68 mg/dL (ref 0.44–1.00)
GFR, Estimated: >60 mL/min (ref >60)
Glucose, Bld: 106 mg/dL — ABNORMAL HIGH (ref 70–99)
Potassium: 3.7 mmol/L (ref 3.5–5.1)
Sodium: 136 mmol/L (ref 135–145)

## 2022-02-28 LAB — CBC
HCT: 28.8 % — ABNORMAL LOW (ref 36.0–46.0)
Hemoglobin: 9.3 g/dL — ABNORMAL LOW (ref 12.0–15.0)
MCH: 28.7 pg (ref 26.0–34.0)
MCHC: 32.3 g/dL (ref 30.0–36.0)
MCV: 88.9 fL (ref 80.0–100.0)
Platelets: 226 10*3/uL (ref 150–400)
RBC: 3.24 MIL/uL — ABNORMAL LOW (ref 3.87–5.11)
RDW: 14.1 % (ref 11.5–15.5)
WBC: 7.4 10*3/uL (ref 4.0–10.5)
nRBC: 0 % (ref 0.0–0.2)

## 2022-02-28 LAB — MAGNESIUM: Magnesium: 2.1 mg/dL (ref 1.7–2.4)

## 2022-02-28 MED ORDER — POLYSACCHARIDE IRON COMPLEX 150 MG PO CAPS
150.0000 mg | ORAL_CAPSULE | Freq: Every day | ORAL | Status: DC
  Administered 2022-02-28 – 2022-03-03 (×3): 150 mg via ORAL
  Filled 2022-02-28 (×4): qty 1

## 2022-02-28 MED ORDER — ADULT MULTIVITAMIN W/MINERALS CH
1.0000 | ORAL_TABLET | Freq: Every day | ORAL | Status: DC
  Administered 2022-02-28 – 2022-03-03 (×4): 1 via ORAL
  Filled 2022-02-28 (×4): qty 1

## 2022-02-28 MED ORDER — SODIUM BICARBONATE 650 MG PO TABS
650.0000 mg | ORAL_TABLET | Freq: Two times a day (BID) | ORAL | Status: DC
  Administered 2022-02-28 – 2022-03-03 (×7): 650 mg via ORAL
  Filled 2022-02-28 (×7): qty 1

## 2022-02-28 MED ORDER — SENNOSIDES-DOCUSATE SODIUM 8.6-50 MG PO TABS
2.0000 | ORAL_TABLET | Freq: Two times a day (BID) | ORAL | Status: DC
  Administered 2022-02-28 – 2022-03-03 (×7): 2 via ORAL
  Filled 2022-02-28 (×7): qty 2

## 2022-02-28 MED ORDER — ENSURE ENLIVE PO LIQD
237.0000 mL | Freq: Two times a day (BID) | ORAL | Status: DC
  Administered 2022-02-28 – 2022-03-01 (×2): 237 mL via ORAL

## 2022-02-28 MED ORDER — MORPHINE SULFATE (PF) 2 MG/ML IV SOLN
1.0000 mg | INTRAVENOUS | Status: DC | PRN
  Administered 2022-02-28 (×2): 2 mg via INTRAVENOUS
  Administered 2022-02-28: 1 mg via INTRAVENOUS
  Administered 2022-03-01 (×2): 2 mg via INTRAVENOUS
  Administered 2022-03-01: 1 mg via INTRAVENOUS
  Administered 2022-03-01 – 2022-03-03 (×4): 2 mg via INTRAVENOUS
  Filled 2022-02-28 (×10): qty 1

## 2022-02-28 MED ORDER — SODIUM CHLORIDE 0.9 % IV SOLN
2.0000 g | INTRAVENOUS | Status: DC
  Administered 2022-03-01 – 2022-03-02 (×2): 2 g via INTRAVENOUS
  Filled 2022-02-28: qty 20
  Filled 2022-02-28 (×2): qty 2

## 2022-02-28 MED ORDER — VITAMIN C 500 MG PO TABS
500.0000 mg | ORAL_TABLET | Freq: Two times a day (BID) | ORAL | Status: DC
  Administered 2022-02-28 – 2022-03-03 (×6): 500 mg via ORAL
  Filled 2022-02-28 (×6): qty 1

## 2022-02-28 MED ORDER — ZINC SULFATE 220 (50 ZN) MG PO CAPS
220.0000 mg | ORAL_CAPSULE | Freq: Every day | ORAL | Status: DC
  Administered 2022-02-28 – 2022-03-03 (×4): 220 mg via ORAL
  Filled 2022-02-28 (×4): qty 1

## 2022-02-28 NOTE — Progress Notes (Signed)
Urology Inpatient Progress Note  Subjective: No acute events overnight.  She is afebrile, VSS. Creatinine down today, 0.68.  WBC count down, 7.4.  Hemoglobin stable, 9.3.  Admission urine culture has finalized with Macrobid resistant Proteus mirabilis, on antibiotics as below. Catheter in place draining clear, yellow urine.  There is some blood product sediment along the catheter tubing but no evidence of clots. Today she reports some LLE discomfort.  She is having some persistent low back pain, improved over prior.  She denies abdominal pain.  She is unsure when she last passed flatus or had a bowel movement. I met with Dr. Roosevelt Locks at the bedside, who plans to extend antibiotics for UTI treatment/prevention given bladder perf. Further workup of left lower extremity pain pending.  Anti-infectives: Anti-infectives (From admission, onward)    Start     Dose/Rate Route Frequency Ordered Stop   02/26/22 0900  cefTRIAXone (ROCEPHIN) 1 g in sodium chloride 0.9 % 100 mL IVPB        1 g 200 mL/hr over 30 Minutes Intravenous Every 24 hours 02/25/22 1046     02/25/22 1100  metroNIDAZOLE (FLAGYL) IVPB 500 mg        500 mg 100 mL/hr over 60 Minutes Intravenous Every 12 hours 02/25/22 1047     02/25/22 0945  cefTRIAXone (ROCEPHIN) 1 g in sodium chloride 0.9 % 100 mL IVPB        1 g 200 mL/hr over 30 Minutes Intravenous  Once 02/25/22 0937 02/25/22 1058       Current Facility-Administered Medications  Medication Dose Route Frequency Provider Last Rate Last Admin   amLODipine (NORVASC) tablet 10 mg  10 mg Oral Daily Sharen Hones, MD       cefTRIAXone (ROCEPHIN) 1 g in sodium chloride 0.9 % 100 mL IVPB  1 g Intravenous Q24H Ivor Costa, MD 200 mL/hr at 02/27/22 0923 1 g at 02/27/22 6063   Chlorhexidine Gluconate Cloth 2 % PADS 6 each  6 each Topical Daily Sharen Hones, MD   6 each at 02/27/22 0948   diclofenac Sodium (VOLTAREN) 1 % topical gel 4 g  4 g Topical QID PRN Sharion Settler, NP   4 g at  02/28/22 0017   hydrALAZINE (APRESOLINE) injection 5 mg  5 mg Intravenous Q2H PRN Ivor Costa, MD   5 mg at 02/28/22 0160   iron polysaccharides (NIFEREX) capsule 150 mg  150 mg Oral Daily Sharen Hones, MD       levothyroxine (SYNTHROID) tablet 88 mcg  88 mcg Oral QHS Ivor Costa, MD   88 mcg at 02/27/22 2234   methocarbamol (ROBAXIN) tablet 500 mg  500 mg Oral Q8H PRN Ivor Costa, MD   500 mg at 02/28/22 0013   metroNIDAZOLE (FLAGYL) IVPB 500 mg  500 mg Intravenous Q12H Ivor Costa, MD   Stopped at 02/28/22 0646   morphine (PF) 2 MG/ML injection 1-2 mg  1-2 mg Intravenous Q4H PRN Mansy, Jan A, MD   2 mg at 02/28/22 0801   ondansetron (ZOFRAN) injection 4 mg  4 mg Intravenous Q8H PRN Ivor Costa, MD       oxyCODONE (Oxy IR/ROXICODONE) immediate release tablet 5 mg  5 mg Oral Q4H PRN Sharen Hones, MD   5 mg at 02/28/22 0411   QUEtiapine (SEROQUEL) tablet 25 mg  25 mg Oral QHS Sharen Hones, MD   25 mg at 02/27/22 2254   rosuvastatin (CRESTOR) tablet 20 mg  20 mg Oral Q M,W,F Ivor Costa, MD  20 mg at 02/25/22 1155   sertraline (ZOLOFT) tablet 150 mg  150 mg Oral Daily Ivor Costa, MD   150 mg at 02/27/22 0931   Objective: Vital signs in last 24 hours: Temp:  [97.7 F (36.5 C)-98.8 F (37.1 C)] 98.3 F (36.8 C) (01/15 0800) Pulse Rate:  [75-81] 78 (01/15 0800) Resp:  [16-22] 20 (01/15 0800) BP: (131-184)/(62-106) 179/73 (01/15 0800) SpO2:  [94 %-97 %] 97 % (01/15 0800)  Intake/Output from previous day: 01/14 0701 - 01/15 0700 In: -  Out: 2050 [Urine:2050] Intake/Output this shift: No intake/output data recorded.  Physical Exam Vitals and nursing note reviewed.  Constitutional:      General: She is not in acute distress.    Appearance: She is not ill-appearing, toxic-appearing or diaphoretic.  HENT:     Head: Normocephalic and atraumatic.  Pulmonary:     Effort: Pulmonary effort is normal. No respiratory distress.  Abdominal:     General: There is no distension.     Palpations:  Abdomen is soft.     Tenderness: There is abdominal tenderness (Mild tenderness with deep palpation). There is no guarding or rebound.  Skin:    General: Skin is warm and dry.  Neurological:     Mental Status: She is alert.  Psychiatric:        Mood and Affect: Mood normal.        Behavior: Behavior normal.    Lab Results:  Recent Labs    02/27/22 0433 02/28/22 0714  WBC 8.9 7.4  HGB 9.1* 9.3*  HCT 29.0* 28.8*  PLT 248 226   BMET Recent Labs    02/27/22 0433 02/28/22 0714  NA 137 136  K 3.7 3.7  CL 108 108  CO2 21* 20*  GLUCOSE 129* 106*  BUN 29* 18  CREATININE 0.93 0.68  CALCIUM 8.3* 8.2*   PT/INR Recent Labs    02/25/22 1419  LABPROT 15.9*  INR 1.3*   Assessment & Plan: 82 year old female with traumatic bladder dome perforation and gross hematuria with a recent history of multiple falls.  Gross hematuria is significantly improved over prior and blood counts are stable to improving.  Her creatinine has returned to baseline.  No peritoneal signs on physical exam today, however bowel function is indeterminate and she is challenging historian this morning.  Agree with continuing antibiotics given known bladder perforation to reduce the risk of peritonitis.  Will defer to primary team for workup of LLE pain.  Will arrange outpatient follow-up with cystogram in approximately 2 weeks to determine timing of Foley removal.  Debroah Loop, PA-C 02/28/2022

## 2022-02-28 NOTE — Progress Notes (Signed)
Physical Therapy Treatment Patient Details Name: JAMY WHYTE MRN: [id number] DOB: 1941/01/04 Today's Date: 02/28/2022   History of Present Illness Sukanya Goldblatt is an 48yoF who comes [facility] on [facility]22 c hematuria, ABD pain and fall at home, admitted with UTI. Pt still in cast on RLE s/p ankle fracture 6 weeks prior.    PT Comments    Legs appears more robust, less painful with bed exercises, however pt continue to struggle with rising to standing, very labored, high surface requirement, and physical assist needed for safety. Pt able to commence some gait training today, AMB 31f at bedside which requires encouragement, however fatigue is a major limitations. Global weakness makes AMB in CAM boot very labored and tricky at times. Despite appearing somewhat more robust, mobility remains concerning for patient given her need to be alone at home for parts of the day. DC to SNF still makes the most sense from a safety and rehabilitation standpoint. Pt could conceivably DC ot home with extensive DME and heavy 24/7 support from family, but it is not clear that these are available. Will contact family today to determine capacity to provide caregiver assistance.   Of note: appears dressing had migrated to some degree from linen and mobility in bed. I asked NSG to apply a dressing to allow for clean use of CAM boot without abrasion to wound. Wound appears erythematous today to author, however first time visualizing. Comparison is to prior images seen in chart. AChief Strategy Officertook an i[employer] opticianprior to wrapping by RTherapist, sports          Recommendations for follow up therapy are one component of a multi-disciplinary discharge planning process, led by the attending physician.  Recommendations may be updated based on patient status, additional functional criteria and insurance authorization.  Follow Up Recommendations  Skilled nursing-short term rehab (<3 hours/day) Can patient physically be transported by private vehicle: Yes    Assistance Recommended at Discharge Set up Supervision/Assistance  Patient can return home with the following A lot of help with walking and/or transfers;Help with stairs or ramp for entrance;Assistance with cooking/housework;Assist for transportation;Assistance with feeding;Direct supervision/assist for financial management   Equipment Recommendations  None recommended by PT    Recommendations for Other Services       Precautions / Restrictions Precautions Precautions: Fall Restrictions RLE Weight Bearing: Weight bearing as tolerated (in CAM rocker boot)     Mobility  Bed Mobility Overal bed mobility: Needs Assistance Bed Mobility: Supine to Sit     Supine to sit: Min assist, HOB elevated     General bed mobility comments: labored, heavy effort    Transfers Overall transfer level: Needs assistance Equipment used: Rolling walker (2 wheels) Transfers: Sit to/from Stand, Bed to chair/wheelchair/BSC Sit to Stand: Min assist, From elevated surface   Step pivot transfers: Min assist, From elevated surface       General transfer comment: needs frequent safety cues for best use of RW, hand placement, etc    Ambulation/Gait Ambulation/Gait assistance: Min guard Gait Distance (Feet): 16 Feet Assistive device: Rolling walker (2 wheels) Gait Pattern/deviations: WFL(Within Functional Limits), Step-to pattern       General Gait Details: FWD to windosill, backward to bed, then is convincable to repeat effort despite moderate to heavy global fatigue limiting.   Stairs             Wheelchair Mobility    Modified Rankin (Stroke Patients Only)       Balance  Cognition Arousal/Alertness: Awake/alert Behavior During Therapy: WFL for tasks assessed/performed Overall Cognitive Status: Within Functional Limits for tasks assessed                                 General Comments: some  repeating of conversation, otherwise WNL        Exercises General Exercises - Lower Extremity Short Arc Quad: AROM, Both, 20 reps, Seated (appears more robust compared to previous day, no pain) Heel Slides: Seated, AAROM, Both, 5 reps, Limitations Heel Slides Limitations: trunk weakness, needs modA to perform.    General Comments        Pertinent Vitals/Pain Pain Assessment Pain Assessment: No/denies pain    Home Living                          Prior Function            PT Goals (current goals can now be found in the care plan section) Acute Rehab PT Goals Patient Stated Goal: regain strength and AMB status for home PT Goal Formulation: With patient Time For Goal Achievement: 03/13/22 Potential to Achieve Goals: Good Progress towards PT goals: Progressing toward goals    Frequency    Min 2X/week      PT Plan Current plan remains appropriate    Co-evaluation              AM-PAC PT "6 Clicks" Mobility   Outcome Measure  Help needed turning from your back to your side while in a flat bed without using bedrails?: A Little Help needed moving from lying on your back to sitting on the side of a flat bed without using bedrails?: A Little Help needed moving to and from a bed to a chair (including a wheelchair)?: A Lot Help needed standing up from a chair using your arms (e.g., wheelchair or bedside chair)?: A Lot Help needed to walk in hospital room?: A Lot Help needed climbing 3-5 steps with a railing? : A Lot 6 Click Score: 14    End of Session Equipment Utilized During Treatment: Gait belt (CAM rocker) Activity Tolerance: Patient tolerated treatment well;Patient limited by fatigue Patient left: in chair;with call bell/phone within reach [facility]) Nurse Communication: Mobility status PT Visit Diagnosis: Difficulty in walking, not elsewhere classified (R26.2);Other abnormalities of gait and mobility (R26.89)     Time: 9794-8016 PT Time Calculation  (min) (ACUTE ONLY): 39 min  Charges:  $Therapeutic Exercise: 38-52 mins                    2:03 PM, 02/28/22 Etta Grandchild, PT, DPT Physical Therapist - [facility]  [phone] ([place])   Sherril Heyward C 02/28/2022, 1:59 PM

## 2022-02-28 NOTE — Progress Notes (Addendum)
Progress Note   Patient: Laurie Hale OEV:[id number] DOB: 05-30-1940 DOA: 02/25/2022     3 DOS: the patient was seen and examined on 02/28/2022   Brief hospital course: Laurie Hale is a 82 y.o. female with medical history significant of left ankle fracture on cast, hypertension, hyperlipidemia, CAD, hypothyroidism, GERD, depression, breast cancer, chronic back pain, who presents with hematuria, abdominal pain and fall.  Patient has across hematuria, urine culture was sent out, she is placed on IV antibiotics for UTI.  CT abdomen pelvis and a CT cystogram showed evidence of ruptured bladder, and bladder wall hematoma.  Urology is consulted, patient would prefer conservative treatment.  Foley catheter was anchored to decompress the bladder.  Assessment and Plan:  Acute cystitis with hematuria secondary to Laurie mirabilis. Ruptured urinary bladder. Fortunately, patient did not develop sepsis.  Currently she is hemodynamically stable.  Patient has been seen by urology, determined to continue with conservative treatment.  Foley catheter anchored to decompress the bladder.  Patient be followed closely. Urine culture came back with Laurie Hale.  Given ruptured urinary bladder, patient could have some peritonitis, but currently patient does not seem to have any signs of peritonitis.  We will treat with 10 days of antibiotics. Spoke with urology, patient need to keep the foley l for 2 weeks. Can discharge when medically stable.    Bladder hematoma. Acute blood loss anemia. Iron deficient anemia. Gross hematuria. Patient has a significant iron deficiency, continue IV iron.  Continue oral iron.  Hematuria is improving, hemoglobin has been stabilized.   Acute kidney injury. Metabolic acidosis. Function improved, CO2 level still low at 20, started sodium bicarbonate tablets.  Left leg pain. Patient has been complaining of resting pain at the distal left leg, does not seem to involve the joint.   Patient has some swelling in the left leg, will obtain ultrasound to rule out DVT and PAD.  Fall at home.   Chronic back pain. Recent right ankle fracture Right foot pressure ulcer stage 3 due to cast. PT/OT. Repeat x-ray of right ankle still has displaced the fracture, cast removed by orthopedics, patient developed stage III pressure ulcer on right foot, discussed with podiatry and orthopedics, patient will be followed by wound care.   Abnormal LFTs. 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 Acute hepatitis panel negative.   Essential hypertension. Coronary disease. Dyslipidemia. Increased amlodipine.    Obesity with BMI of 33.99. Diet and exercise.   Bacterial vaginitis. Will discontinue Flagyl after 3 days her dose.     Subjective:  Patient is complaining bilateral leg pain, more on the left side.  Pain happened in the rest, worse with exertion. Denies any short of breath, has some constipation.  Last bowel recorded was 01/13.  Started on senna.  Physical Exam: Vitals:   02/28/22 0050 02/28/22 0419 02/28/22 0800 02/28/22 0859  BP: (!) 184/65 (!) 160/62 (!) 179/73 (!) 153/92  Pulse: 80 79 78 75  Resp: '16 18 20   '$ Temp: 98.4 F (36.9 C) 97.7 F (36.5 C) 98.3 F (36.8 C)   TempSrc: Oral Oral Oral   SpO2: 95% 96% 97%   Weight:      Height:       General exam: Appears calm and comfortable  Respiratory system: Clear to auscultation. Respiratory effort normal. Cardiovascular system: S1 & S2 heard, RRR. No JVD, murmurs, rubs, gallops or clicks. No pedal edema. Gastrointestinal system: Abdomen is nondistended, soft and nontender. No organomegaly or masses felt. Normal bowel sounds heard. Central  nervous system: Alert and oriented. No focal neurological deficits. Extremities: Right foot ulcer.  Left pedal pulse 2+. Skin: No rashes, lesions or ulcers Psychiatry: Judgement and insight appear normal. Mood & affect appropriate.   Data Reviewed:  Lab results  reviewed.  Family Communication: None  Disposition: Status is: Inpatient Remains inpatient appropriate because: Severity of disease, IV treatment.  Planned Discharge Destination: Skilled nursing facility    Time spent: 50 minutes  Author: Sharen Hones, MD 02/28/2022 10:18 AM  For on call review [email or web address].

## 2022-02-28 NOTE — TOC Initial Note (Signed)
Transition of Care [facility]) - Initial/Assessment Note    Patient Details  Name: Laurie Hale MRN: [id number] Date of Birth: 1940/05/14  Transition of Care [facility]) CM/SW Contact:    Gerilyn Pilgrim, LCSW Phone Number: 02/28/2022, 3:57 PM  Clinical Narrative:    SW spoke with daughter regarding SNF recs. Daughter reports pt walks with a rollator at home and has a 3 in 1 but does not use it. Daughter reports that pt still sees Dr. Reinaldo Raddle. Daughter would like a SNF workup started and sent to facilities in [place] area. Once bed offers are available would like [place] to present these with her and the patient. SW started [employer], patient triggered level 2. SW unable to work on this further as [facility] Is closed today for holiday. SW will follow up tomorrow.          Patient Goals and CMS Choice            Expected Discharge Plan and Services                                              Prior Living Arrangements/Services                       Activities of Daily Living Home Assistive Devices/Equipment: Gilford Rile (specify type) (four wheel walker) ADL Screening (condition at time of admission) Patient's cognitive ability adequate to safely complete daily activities?: Yes Is the patient deaf or have difficulty hearing?: No Does the patient have difficulty seeing, even when wearing glasses/contacts?: No Does the patient have difficulty concentrating, remembering, or making decisions?: No Patient able to express need for assistance with ADLs?: Yes Does the patient have difficulty dressing or bathing?: No Independently performs ADLs?: Yes (appropriate for developmental age) Does the patient have difficulty walking or climbing stairs?: Yes Weakness of Legs: Both Weakness of Arms/Hands: None  Permission Sought/Granted                  Emotional Assessment              Admission diagnosis:  UTI (urinary tract infection) [N39.0] Gross hematuria [R31.0] Acute  cystitis without hematuria [N30.00] AKI (acute kidney injury) ([place]) [N17.9] Fall, initial encounter [W19.XXXA] Patient Active Problem List   Diagnosis Date Noted   Left leg pain 02/28/2022   Acute cystitis with hematuria 02/28/2022   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, stage 3 ([place]) 02/27/2022   Ruptured, bladder, spontaneous 02/26/2022   Hematoma of bladder wall 02/26/2022   Acute blood loss anemia 33/29/5188   Metabolic acidosis 41/66/0630   UTI (urinary tract infection) 02/25/2022   Hematuria 02/25/2022   Depression 02/25/2022   AKI (acute kidney injury) ([place]) 02/25/2022   Abnormal LFTs 02/25/2022   BV (bacterial vaginosis) 02/25/2022   Obesity (BMI 30-39.9) 02/25/2022   Elevated lipase 02/25/2022   Closed right ankle fracture 02/25/2022   Fall at home, initial encounter 02/25/2022   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 prolapse 10/24/2017   Depression, recurrent ([place]) 10/24/2017   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 [place] [address] Phone: [phone] Fax: [phone]  [facility] [place], [address] [place] [address] Phone: [phone] Fax: [phone]  [id number] [place], [place] - [address] [place] [address] Phone: [phone] Fax: [phone]     Social Determinants of Health (SDOH) Social History: [place]: No Food Insecurity (02/25/2022)  Housing: Low Risk  (02/25/2022)  Transportation Needs: No Transportation Needs (02/25/2022)  Utilities: Not At Risk (02/25/2022)  Tobacco Use: Low Risk  (02/26/2022)   SDOH Interventions:     Readmission Risk Interventions     No data to display

## 2022-02-28 NOTE — Progress Notes (Signed)
Initial Nutrition Assessment  DOCUMENTATION CODES:   Obesity unspecified  INTERVENTION:   -Ensure Enlive po BID, each supplement provides 350 kcal and 20 grams of protein.  -MVI with minerals daily -500 mg vitamin C BID -220 mg zinc sulfate daily x 14 days -Liberalize diet to regular for wider variety of meal selections  NUTRITION DIAGNOSIS:   Increased nutrient needs related to wound healing as evidenced by estimated needs.  GOAL:   Patient will meet greater than or equal to 90% of their needs  MONITOR:   PO intake, Supplement acceptance  REASON FOR ASSESSMENT:   Malnutrition Screening Tool    ASSESSMENT:   Pt with medical history significant of left ankle fracture on cast, hypertension, hyperlipidemia, CAD, hypothyroidism, GERD, depression, breast cancer, chronic back pain, who presents with hematuria, abdominal pain and fall.  Pt admitted with UTI and hematuria.   Reviewed I/O's: -2.1 L x 24 hours and -912 ml since admission  UOP: 2.1 L x 24 hours  Per CWOCN notes, pt with stage 3 full thickness pressure injury to rt ankle. Pt with recent rt ankle fractures and cast was removed during admission.   Spoke with pt at bedside, who reports feeling better today. She shares she has had decreased appetite over the past 1-2 weeks. Pt generally consumes 3 meals per day (Breakfast: cereal; Lunch: sandwich; Dinner: meat, starch, and vegetable). Pt lives with her son, who prepares meals for her. Pt shares over the past week, she has been consuming about 50% less of her meals.   Per pt, appetite has improved since admission. She has been consuming most of her meals here.   Pt unsure of UBW, but reports she was gaining wt PTA. She suspects she may have lost some in the past week due to not eating as much. Reviewed wt hx; no wt loss noted.   Discussed importance of good meal and supplement intake to promote healing. Pt amenable to supplements.   Medications reviewed and include  senokot.   Labs reviewed.  NUTRITION - FOCUSED PHYSICAL EXAM:  Flowsheet Row Most Recent Value  Orbital Region No depletion  Upper Arm Region No depletion  Thoracic and Lumbar Region No depletion  Buccal Region No depletion  Temple Region No depletion  Clavicle Bone Region No depletion  Clavicle and Acromion Bone Region No depletion  Scapular Bone Region No depletion  Dorsal Hand No depletion  Patellar Region No depletion  Anterior Thigh Region No depletion  Posterior Calf Region No depletion  Edema (RD Assessment) None  Hair Reviewed  Eyes Reviewed  Mouth Reviewed  Skin Reviewed  Nails Reviewed       Diet Order:   Diet Order             Diet regular Room service appropriate? Yes; Fluid consistency: Thin  Diet effective now                   EDUCATION NEEDS:   Education needs have been addressed  Skin:  Skin Assessment: Skin Integrity Issues: Skin Integrity Issues:: Stage III Stage III: rt ankle  Last BM:  02/26/22  Height:   Ht Readings from Last 1 Encounters:  02/25/22 '5\' 1"'$  (1.549 m)    Weight:   Wt Readings from Last 1 Encounters:  02/25/22 81.6 kg    Ideal Body Weight:  47.7 kg  BMI:  Body mass index is 33.99 kg/m.  Estimated Nutritional Needs:   Kcal:  1650-1850  Protein:  80-95 grams  Fluid:  > 1.6 L    Loistine Chance, RD, LDN, [place] Registered Dietitian II Certified Diabetes Care and Education Specialist Please refer to [facility] for RD and/or RD on-call/weekend/after hours pager

## 2022-02-28 NOTE — Progress Notes (Signed)
Patient is alert and oriented x4. Complained of r-ankle/foot pain and received oxy. Denied additional needs.

## 2022-03-01 ENCOUNTER — Other Ambulatory Visit: Payer: Self-pay | Admitting: Physician Assistant

## 2022-03-01 ENCOUNTER — Inpatient Hospital Stay: Payer: Medicare Other

## 2022-03-01 DIAGNOSIS — S3729XD Other injury of bladder, subsequent encounter: Secondary | ICD-10-CM

## 2022-03-01 DIAGNOSIS — N3001 Acute cystitis with hematuria: Secondary | ICD-10-CM | POA: Diagnosis not present

## 2022-03-01 DIAGNOSIS — R319 Hematuria, unspecified: Secondary | ICD-10-CM | POA: Diagnosis not present

## 2022-03-01 DIAGNOSIS — D509 Iron deficiency anemia, unspecified: Secondary | ICD-10-CM | POA: Insufficient documentation

## 2022-03-01 DIAGNOSIS — N3289 Other specified disorders of bladder: Secondary | ICD-10-CM | POA: Diagnosis not present

## 2022-03-01 LAB — HEPATITIS PANEL, ACUTE: Hep A IgM: NEGATIVE — AB

## 2022-03-01 LAB — CBC
HCT: 29 % — ABNORMAL LOW (ref 36.0–46.0)
Hemoglobin: 9.5 g/dL — ABNORMAL LOW (ref 12.0–15.0)
MCH: 28.6 pg (ref 26.0–34.0)
MCHC: 32.8 g/dL (ref 30.0–36.0)
MCV: 87.3 fL (ref 80.0–100.0)
Platelets: 289 10*3/uL (ref 150–400)
RBC: 3.32 MIL/uL — ABNORMAL LOW (ref 3.87–5.11)
RDW: 13.9 % (ref 11.5–15.5)
WBC: 7.7 10*3/uL (ref 4.0–10.5)
nRBC: 0 % (ref 0.0–0.2)

## 2022-03-01 LAB — BASIC METABOLIC PANEL
Anion gap: 8 (ref 5–15)
BUN: 17 mg/dL (ref 8–23)
CO2: 22 mmol/L (ref 22–32)
Calcium: 8.6 mg/dL — ABNORMAL LOW (ref 8.9–10.3)
Chloride: 106 mmol/L (ref 98–111)
Creatinine, Ser: 0.72 mg/dL (ref 0.44–1.00)
GFR, Estimated: >60 mL/min (ref >60)
Glucose, Bld: 107 mg/dL — ABNORMAL HIGH (ref 70–99)
Potassium: 3.2 mmol/L — ABNORMAL LOW (ref 3.5–5.1)
Sodium: 136 mmol/L (ref 135–145)

## 2022-03-01 MED ORDER — POTASSIUM CHLORIDE CRYS ER 20 MEQ PO TBCR
40.0000 meq | EXTENDED_RELEASE_TABLET | ORAL | Status: AC
  Administered 2022-03-01 (×2): 40 meq via ORAL
  Filled 2022-03-01 (×2): qty 2

## 2022-03-01 MED ORDER — LACTULOSE 10 GM/15ML PO SOLN
20.0000 g | Freq: Once | ORAL | Status: AC
  Administered 2022-03-01: 20 g via ORAL
  Filled 2022-03-01: qty 30

## 2022-03-01 MED ORDER — LISINOPRIL 20 MG PO TABS
20.0000 mg | ORAL_TABLET | Freq: Every day | ORAL | Status: DC
  Administered 2022-03-01 – 2022-03-03 (×3): 20 mg via ORAL
  Filled 2022-03-01 (×3): qty 1

## 2022-03-01 NOTE — TOC Progression Note (Signed)
Transition of Care [facility]) - Progression Note    Patient Details  Name: Laurie Hale MRN: [id number] Date of Birth: 1940/05/25  Transition of Care [facility]) CM/SW Contact  Gerilyn Pilgrim, LCSW Phone Number: 03/01/2022, 10:01 AM  Clinical Narrative:   Fl2, 30 day note and requested notes uploaded to [facility]. Passr still pending.          Expected Discharge Plan and Services                                               Social Determinants of Health (SDOH) Interventions SDOH Screenings   Food Insecurity: No Food Insecurity (02/25/2022)  Housing: Low Risk  (02/25/2022)  Transportation Needs: No Transportation Needs (02/25/2022)  Utilities: Not At Risk (02/25/2022)  Tobacco Use: Low Risk  (02/26/2022)    Readmission Risk Interventions     No data to display

## 2022-03-01 NOTE — Progress Notes (Signed)
Progress Note   Patient: Laurie Hale INO:[id number] DOB: [birth year] DOA: 02/25/2022     4 DOS: the patient was seen and examined on 03/01/2022   Brief hospital course: Laurie Hale is a 82 y.o. female with medical history significant of left ankle fracture on cast, hypertension, hyperlipidemia, CAD, hypothyroidism, GERD, depression, breast cancer, chronic back pain, who presents with hematuria, abdominal pain and fall.  Patient has across hematuria, urine culture was sent out, she is placed on IV antibiotics for UTI.  CT abdomen pelvis and a CT cystogram showed evidence of ruptured bladder, and bladder wall hematoma.  Urology is consulted, patient would prefer conservative treatment.  Foley catheter was anchored to decompress the bladder.  Urine culture came back with Proteus, patient is on Rocephin.  Can change to oral Keflex at time of discharge, but prefer to treat at least 10 days with antibiotics.  Currently pending nursing home placement.  Assessment and Plan: Acute cystitis with hematuria secondary to Proteus mirabilis. Ruptured urinary bladder. Fortunately, patient did not develop sepsis.  Currently she is hemodynamically stable.  Patient has been seen by urology, determined to continue with conservative treatment.  Foley catheter anchored to decompress the bladder.  Patient be followed closely. Urine culture came back with [name].  Given ruptured urinary bladder, patient could have some peritonitis, but currently patient does not seem to have any signs of peritonitis.  We will treat with 10 days of antibiotics. Spoke with urology, patient need to keep the foley l for 2 weeks. Can discharge when medically stable.  Patient is currently pending nursing home placement.  Bladder hematoma. Acute blood loss anemia. Iron deficient anemia. Gross hematuria. Patient has a significant iron deficiency, received IV iron.  Continue oral iron.  Hematuria is improving, hemoglobin has been  stabilized.   Acute kidney injury. Metabolic acidosis. Hypokalemia. Function has normalized, metabolic acidosis better, will continue 1 more day of oral sodium bicarb, discontinue tomorrow. Replete potassium.   Left leg pain. Duplex ultrasound to rule out a DVT and peripheral vascular disease.  Pain seems to be better, most likely secondary to neuropathy.   Fall at home.   Chronic back pain. Recent right ankle fracture Right foot pressure ulcer stage 3 due to cast. PT/OT. Repeat x-ray of right ankle still has displaced the fracture, cast removed by orthopedics, patient developed stage III pressure ulcer on right foot, discussed with podiatry and orthopedics, patient will be followed by wound care.   Abnormal LFTs. 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 Acute hepatitis panel negative.   Essential hypertension. Coronary disease. Dyslipidemia. Increased amlodipine. added back lisinopril   Obesity with BMI of 33.99. Diet and exercise.   Bacterial vaginitis. Completed course of Flagyl.         Subjective:  Patient doing well today, currently no complaints.  Physical Exam: Vitals:   03/01/22 0524 03/01/22 0611 03/01/22 0641 03/01/22 0848  BP: (!) 184/76 (!) 188/80 (!) 141/69 (!) 156/86  Pulse: 77 69 71 80  Resp: 18   19  Temp: 98.4 F (36.9 C)   98.4 F (36.9 C)  TempSrc: Oral   Oral  SpO2: 96%   98%  Weight:      Height:       General exam: Appears calm and comfortable  Respiratory system: Clear to auscultation. Respiratory effort normal. Cardiovascular system: S1 & S2 heard, RRR. No JVD, murmurs, rubs, gallops or clicks. No pedal edema. Gastrointestinal system: Abdomen is nondistended, soft and nontender. No organomegaly or masses felt.  Normal bowel sounds heard. Central nervous system: Alert and oriented. No focal neurological deficits. Extremities: Right foot ulcer. Skin: No rashes, lesions or ulcers Psychiatry: Judgement and insight appear normal. Mood &  affect appropriate.   Data Reviewed:  Lab results reviewed.  Family Communication: daughter updated  Disposition: Status is: Inpatient Remains inpatient appropriate because: Severity of disease, IV treatment  Planned Discharge Destination: Skilled nursing facility    Time spent: 35 minutes  Author: Sharen Hones, MD 03/01/2022 10:36 AM  For on call review [email or web address].

## 2022-03-01 NOTE — Progress Notes (Signed)
Reevaluated pain and bp. Pain and bp has improved significantly.

## 2022-03-01 NOTE — Progress Notes (Signed)
Occupational Therapy Treatment Patient Details Name: Laurie Hale MRN: [id number] DOB: 15-May-1940 Today's Date: 03/01/2022   History of present illness Laurie Hale is an 19yoF who comes to [facility] on 02/25/22 c hematuria, ABD pain and fall at home, admitted with UTI. Pt still in cast on RLE s/p ankle fracture 6 weeks prior.   OT comments  Ms Weikel was seen for OT treatment on this date. Upon arrival to room pt reclined in bed, agreeable to tx. Pt requires CGA bed mobility, bed rail use and time. MAX A don CAM boot seated EOB. CGA + RW toilet t/f, multiple attmepts to achieve standing safely from bed, improved from chair height. Pt making good progress toward goals, will continue to follow POC. Discharge recommendation remains appropriate.     Recommendations for follow up therapy are one component of a multi-disciplinary discharge planning process, led by the attending physician.  Recommendations may be updated based on patient status, additional functional criteria and insurance authorization.    Follow Up Recommendations  Skilled nursing-short term rehab (<3 hours/day)     Assistance Recommended at Discharge Frequent or constant Supervision/Assistance  Patient can return home with the following  A lot of help with walking and/or transfers;A lot of help with bathing/dressing/bathroom;Assistance with cooking/housework;Assist for transportation;Help with stairs or ramp for entrance;Direct supervision/assist for medications management   Equipment Recommendations  BSC/3in1    Recommendations for Other Services      Precautions / Restrictions Precautions Precautions: Fall Restrictions Weight Bearing Restrictions: Yes RLE Weight Bearing: Weight bearing as tolerated Other Position/Activity Restrictions: in CAM boot only, NWB out of boot       Mobility Bed Mobility Overal bed mobility: Needs Assistance Bed Mobility: Supine to Sit     Supine to sit: Min guard     General bed mobility  comments: bed rail use    Transfers Overall transfer level: Needs assistance Equipment used: Rolling walker (2 wheels) Transfers: Sit to/from Stand Sit to Stand: Min guard           General transfer comment: mutliple attempts before achieveing sit<>stand with CGA, MOD cues for safety and sequencing     Balance Overall balance assessment: Needs assistance Sitting-balance support: Feet supported Sitting balance-Leahy Scale: Good     Standing balance support: During functional activity, Single extremity supported Standing balance-Leahy Scale: Fair                             ADL either performed or assessed with clinical judgement   ADL Overall ADL's : Needs assistance/impaired                                       General ADL Comments: MAX A don CAM boot seated EOB. CGA + RW toilet t/f.      Cognition Arousal/Alertness: Awake/alert Behavior During Therapy: WFL for tasks assessed/performed Overall Cognitive Status: Within Functional Limits for tasks assessed                                                     Pertinent Vitals/ Pain       Pain Assessment Pain Assessment: Faces Faces Pain Scale: Hurts little more Pain Location: RLE Pain Descriptors /  Indicators: Grimacing, Guarding, Sore Pain Intervention(s): Limited activity within patient's tolerance, Repositioned   Frequency  Min 2X/week        Progress Toward Goals  OT Goals(current goals can now be found in the care plan section)  Progress towards OT goals: Progressing toward goals  Acute Rehab OT Goals Patient Stated Goal: to go home OT Goal Formulation: With patient Time For Goal Achievement: 03/13/22 Potential to Achieve Goals: Good ADL Goals Pt Will Perform Grooming: with modified independence;standing Pt Will Perform Lower Body Dressing: with modified independence;sit to/from stand Pt Will Transfer to Toilet: with modified  independence;ambulating Pt Will Perform Toileting - Clothing Manipulation and hygiene: with modified independence;sit to/from stand  Plan Discharge plan remains appropriate;Frequency remains appropriate    Co-evaluation                 AM-PAC OT "6 Clicks" Daily Activity     Outcome Measure   Help from another person eating meals?: None Help from another person taking care of personal grooming?: A Little Help from another person toileting, which includes using toliet, bedpan, or urinal?: A Lot Help from another person bathing (including washing, rinsing, drying)?: A Lot Help from another person to put on and taking off regular upper body clothing?: None Help from another person to put on and taking off regular lower body clothing?: A Lot 6 Click Score: 17    End of Session    OT Visit Diagnosis: Unsteadiness on feet (R26.81);Muscle weakness (generalized) (M62.81);Pain;History of falling (Z91.81) Pain - Right/Left: Right Pain - part of body: Leg   Activity Tolerance Patient tolerated treatment well   Patient Left with family/visitor present;with call bell/phone within reach (seated on toilet)   Nurse Communication          Time: 4431-5400 OT Time Calculation (min): 23 min  Charges: OT General Charges $OT Visit: 1 Visit OT Treatments $Self Care/Home Management : 23-37 mins  Laurie Hale, M.S. OTR/L  03/01/22, 3:40 PM  ascom [phone]

## 2022-03-01 NOTE — NC FL2 (Addendum)
[place] LEVEL OF CARE FORM     IDENTIFICATION  Patient Name: Laurie Hale Birthdate: 01-29-1941 Sex: female Admission Date (Current Location): 02/25/2022  [facility] and [place] Number:  Engineering geologist and Address:  [facility], [address], [place], [place] [address]      Provider Number: [id number]  Attending Physician Name and Address:  Sharen Hones, MD  Relative Name and Phone Number:  Garlan Fair (Daughter) [phone]    Current Level of Care: Hospital Recommended Level of Care: [place] Prior Approval Number:    Date Approved/Denied:   PASRR Number: [id number]  Discharge Plan: SNF    Current Diagnoses: Patient Active Problem List   Diagnosis Date Noted   Iron deficiency anemia 03/01/2022   Left leg pain 02/28/2022   Acute cystitis with hematuria 02/28/2022   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, stage 3 ([place]) 02/27/2022   Ruptured, bladder, spontaneous 02/26/2022   Hematoma of bladder wall 02/26/2022   Acute blood loss anemia 51/70/0174   Metabolic acidosis 94/49/6759   UTI (urinary tract infection) 02/25/2022   Hematuria 02/25/2022   Depression 02/25/2022   AKI (acute kidney injury) ([place]) 02/25/2022   Abnormal LFTs 02/25/2022   BV (bacterial vaginosis) 02/25/2022   Obesity (BMI 30-39.9) 02/25/2022   Elevated lipase 02/25/2022   Closed right ankle fracture 02/25/2022   Fall at home, initial encounter 02/25/2022   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 prolapse 10/24/2017   Depression, recurrent ([place]) 10/24/2017   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   Dysuria 10/21/2013   Nasal lesion 07/14/2013   Fatigue 04/02/2013   Breast cancer ([place]) 12/02/2012   CAD (coronary artery disease) 09/15/2012   Essential hypertension, benign 09/15/2012   Hypercholesterolemia 09/15/2012   Hypothyroidism 09/15/2012   GERD (gastroesophageal reflux disease) 09/15/2012   Diverticulosis 09/15/2012   Chronic back pain 09/15/2012    Hypokalemia 09/15/2012    Orientation RESPIRATION BLADDER Height & Weight     Self, Time, Situation, Place  Normal Indwelling catheter Weight: 179 lb 14.3 oz (81.6 kg) Height:  '5\' 1"'$  (154.9 cm)  BEHAVIORAL SYMPTOMS/MOOD NEUROLOGICAL BOWEL NUTRITION STATUS      Continent Diet (regular)  AMBULATORY STATUS COMMUNICATION OF NEEDS Skin   Limited Assist Verbally  (Wound foot anterior Right daily dressings xeroform and kerlix, ecchymosis buttocks and foot)                       Personal Care Assistance Level of Assistance  Bathing, Feeding, Dressing Bathing Assistance: Maximum assistance Feeding assistance: Independent Dressing Assistance: Maximum assistance     Functional Limitations Info  Sight, Hearing, Speech Sight Info: Impaired Hearing Info: Adequate Speech Info: Adequate    SPECIAL CARE FACTORS FREQUENCY  PT (By licensed PT), OT (By licensed OT)     PT Frequency: 5 times a week OT Frequency: 5 times a week            Contractures Contractures Info: Not present    Additional Factors Info  Code Status, Allergies Code Status Info: DNR Allergies Info: Ciprofloxacin  Penicillins           Current Medications (03/01/2022):  This is the current hospital active medication list Current Facility-Administered Medications  Medication Dose Route Frequency Provider Last Rate Last Admin   amLODipine (NORVASC) tablet 10 mg  10 mg Oral Daily Sharen Hones, MD   10 mg at 02/28/22 0901   ascorbic acid (VITAMIN C) tablet 500 mg  500 mg Oral BID  Sharen Hones, MD   500 mg at 02/28/22 2024   cefTRIAXone (ROCEPHIN) 2 g in sodium chloride 0.9 % 100 mL IVPB  2 g Intravenous Q24H Zeigler, Dustin G, RPH       Chlorhexidine Gluconate Cloth 2 % PADS 6 each  6 each Topical Daily Sharen Hones, MD   6 each at 02/28/22 0902   diclofenac Sodium (VOLTAREN) 1 % topical gel 4 g  4 g Topical QID PRN Sharion Settler, NP   4 g at 02/28/22 0017   feeding supplement (ENSURE ENLIVE / ENSURE PLUS)  liquid 237 mL  237 mL Oral BID BM Sharen Hones, MD   237 mL at 02/28/22 1542   hydrALAZINE (APRESOLINE) injection 5 mg  5 mg Intravenous Q2H PRN Ivor Costa, MD   5 mg at 02/28/22 1658   iron polysaccharides (NIFEREX) capsule 150 mg  150 mg Oral Daily Sharen Hones, MD   150 mg at 02/28/22 1214   levothyroxine (SYNTHROID) tablet 88 mcg  88 mcg Oral QHS Ivor Costa, MD   88 mcg at 02/28/22 2024   methocarbamol (ROBAXIN) tablet 500 mg  500 mg Oral Q8H PRN Ivor Costa, MD   500 mg at 02/28/22 0013   morphine (PF) 2 MG/ML injection 1-2 mg  1-2 mg Intravenous Q4H PRN Mansy, Jan A, MD   2 mg at 03/01/22 6440   multivitamin with minerals tablet 1 tablet  1 tablet Oral Daily Sharen Hones, MD   1 tablet at 02/28/22 1542   ondansetron (ZOFRAN) injection 4 mg  4 mg Intravenous Q8H PRN Ivor Costa, MD       oxyCODONE (Oxy IR/ROXICODONE) immediate release tablet 5 mg  5 mg Oral Q4H PRN Sharen Hones, MD   5 mg at 03/01/22 0518   potassium chloride SA (KLOR-CON M) CR tablet 40 mEq  40 mEq Oral Antony Odea, MD       QUEtiapine (SEROQUEL) tablet 25 mg  25 mg Oral QHS Sharen Hones, MD   25 mg at 02/28/22 2205   rosuvastatin (CRESTOR) tablet 20 mg  20 mg Oral Q M,W,F Ivor Costa, MD   20 mg at 02/28/22 0901   senna-docusate (Senokot-S) tablet 2 tablet  2 tablet Oral BID Sharen Hones, MD   2 tablet at 02/28/22 2024   sertraline (ZOLOFT) tablet 150 mg  150 mg Oral Daily Ivor Costa, MD   150 mg at 02/28/22 0901   sodium bicarbonate tablet 650 mg  650 mg Oral BID Sharen Hones, MD   650 mg at 02/28/22 2024   zinc sulfate capsule 220 mg  220 mg Oral Daily Sharen Hones, MD   220 mg at 02/28/22 1542     Discharge Medications: Please see discharge summary for a list of discharge medications.  Relevant Imaging Results:  Relevant Lab Results:   Additional Information SS# [id number]  Gerilyn Pilgrim, LCSW

## 2022-03-01 NOTE — TOC PASRR Note (Signed)
RE: Laurie Hale Date of Birth: Feb 04, 1941 Date: 03/01/2022     To Whom It May Concern:   Please be advised that the above-named patient will require a short-term nursing home stay - anticipated 30 days or less for rehabilitation and strengthening.  The plan is for return home

## 2022-03-01 NOTE — Progress Notes (Addendum)
Patient's bp's elevation is believed to be due to pain. Seems to have been elevated all night but was unreported by tech. Gave IV medication after po pain medication was ineffective. Will recheck bp in 30 minutes.

## 2022-03-02 DIAGNOSIS — B9689 Other specified bacterial agents as the cause of diseases classified elsewhere: Secondary | ICD-10-CM

## 2022-03-02 DIAGNOSIS — D62 Acute posthemorrhagic anemia: Secondary | ICD-10-CM | POA: Diagnosis not present

## 2022-03-02 DIAGNOSIS — N3001 Acute cystitis with hematuria: Secondary | ICD-10-CM | POA: Diagnosis not present

## 2022-03-02 DIAGNOSIS — N760 Acute vaginitis: Secondary | ICD-10-CM | POA: Diagnosis not present

## 2022-03-02 DIAGNOSIS — N3289 Other specified disorders of bladder: Secondary | ICD-10-CM | POA: Diagnosis not present

## 2022-03-02 LAB — CBC
HCT: 31.2 % — ABNORMAL LOW (ref 36.0–46.0)
Hemoglobin: 10 g/dL — ABNORMAL LOW (ref 12.0–15.0)
MCH: 28.7 pg (ref 26.0–34.0)
MCHC: 32.1 g/dL (ref 30.0–36.0)
MCV: 89.7 fL (ref 80.0–100.0)
Platelets: 350 10*3/uL (ref 150–400)
RBC: 3.48 MIL/uL — ABNORMAL LOW (ref 3.87–5.11)
RDW: 14.1 % (ref 11.5–15.5)
WBC: 8.4 10*3/uL (ref 4.0–10.5)
nRBC: 0 % (ref 0.0–0.2)

## 2022-03-02 LAB — BASIC METABOLIC PANEL
Anion gap: 10 (ref 5–15)
BUN: 19 mg/dL (ref 8–23)
CO2: 21 mmol/L — ABNORMAL LOW (ref 22–32)
Calcium: 9 mg/dL (ref 8.9–10.3)
Chloride: 109 mmol/L (ref 98–111)
Creatinine, Ser: 0.68 mg/dL (ref 0.44–1.00)
GFR, Estimated: >60 mL/min (ref >60)
Glucose, Bld: 125 mg/dL — ABNORMAL HIGH (ref 70–99)
Potassium: 3.8 mmol/L (ref 3.5–5.1)
Sodium: 140 mmol/L (ref 135–145)

## 2022-03-02 LAB — MAGNESIUM: Magnesium: 2.1 mg/dL (ref 1.7–2.4)

## 2022-03-02 MED ORDER — AMLODIPINE BESYLATE 10 MG PO TABS: 10.0000 mg | ORAL_TABLET | Freq: Every day | ORAL | 1 refills | Status: DC

## 2022-03-02 MED ORDER — ADULT MULTIVITAMIN W/MINERALS CH: 1.0000 | ORAL_TABLET | Freq: Every day | ORAL | 1 refills | Status: DC

## 2022-03-02 MED ORDER — ROPINIROLE HCL 0.25 MG PO TABS
0.2500 mg | ORAL_TABLET | Freq: Every day | ORAL | Status: DC
  Administered 2022-03-02: 0.25 mg via ORAL
  Filled 2022-03-02: qty 1

## 2022-03-02 MED ORDER — SENNOSIDES-DOCUSATE SODIUM 8.6-50 MG PO TABS: 2.0000 | ORAL_TABLET | Freq: Two times a day (BID) | ORAL | 1 refills | Status: DC

## 2022-03-02 MED ORDER — POLYSACCHARIDE IRON COMPLEX 150 MG PO CAPS: 150.0000 mg | ORAL_CAPSULE | Freq: Every day | ORAL | 1 refills | Status: DC

## 2022-03-02 MED ORDER — CEPHALEXIN 500 MG PO CAPS: 500.0000 mg | ORAL_CAPSULE | Freq: Four times a day (QID) | ORAL | 0 refills | Status: AC

## 2022-03-02 MED ORDER — METHOCARBAMOL 500 MG PO TABS: 500.0000 mg | ORAL_TABLET | Freq: Three times a day (TID) | ORAL | 1 refills | Status: DC | PRN

## 2022-03-02 MED ORDER — ASCORBIC ACID 500 MG PO TABS: 500.0000 mg | ORAL_TABLET | Freq: Two times a day (BID) | ORAL | 1 refills | Status: DC

## 2022-03-02 MED ORDER — ZINC SULFATE 220 (50 ZN) MG PO CAPS: 220.0000 mg | ORAL_CAPSULE | Freq: Every day | ORAL | 0 refills | Status: DC

## 2022-03-02 MED ORDER — SERTRALINE HCL 100 MG PO TABS: 150.0000 mg | ORAL_TABLET | Freq: Every day | ORAL | Status: DC

## 2022-03-02 MED ORDER — QUETIAPINE FUMARATE 25 MG PO TABS: 25.0000 mg | ORAL_TABLET | Freq: Every day | ORAL | 1 refills | Status: DC

## 2022-03-02 MED ORDER — ENSURE ENLIVE PO LIQD: 237.0000 mL | Freq: Two times a day (BID) | ORAL | 12 refills | Status: DC

## 2022-03-02 MED ORDER — ROPINIROLE HCL 0.25 MG PO TABS: ORAL_TABLET | ORAL | 0 refills | Status: DC

## 2022-03-02 MED ORDER — OXYCODONE HCL 5 MG PO TABS: 5.0000 mg | ORAL_TABLET | ORAL | 0 refills | Status: DC | PRN

## 2022-03-02 MED ORDER — SODIUM BICARBONATE 650 MG PO TABS: 650.0000 mg | ORAL_TABLET | Freq: Every day | ORAL | 1 refills | Status: DC

## 2022-03-02 NOTE — TOC Progression Note (Signed)
Transition of Care [facility]) - Progression Note    Patient Details  Name: Laurie Hale MRN: [id number] Date of Birth: Mar 14, 1940  [facility]) CM/SW Contact  Gerilyn Pilgrim, LCSW Phone Number: 03/02/2022, 10:50 AM  Clinical Narrative:   SW spoke with patient regarding SNF acceptance. Pt would like daughter terry to decide the facility. LVM with Coralyn Mark.         Expected Discharge Plan and Services                                               Social Determinants of Health (SDOH) Interventions SDOH Screenings   Food Insecurity: No Food Insecurity (02/25/2022)  Housing: Low Risk  (02/25/2022)  Transportation Needs: No Transportation Needs (02/25/2022)  Utilities: Not At Risk (02/25/2022)  Tobacco Use: Low Risk  (02/26/2022)    Readmission Risk Interventions     No data to display

## 2022-03-02 NOTE — Progress Notes (Signed)
Occupational Therapy Treatment Patient Details Name: Laurie Hale MRN: [id number] DOB: 08-21-1940 Today's Date: 03/02/2022   History of present illness Laurie Hale is an 40yoF who comes to [facility] on 02/25/22 c hematuria, ABD pain and fall at home, admitted with UTI. Pt still in cast on RLE s/p ankle fracture 6 weeks prior.   OT comments  [name] was seen for OT treatment on this date. Upon arrival to room pt reclined in bed, agreeable to tx. Pt requires MAX A don CAM boot seated EOB. CGA + RW ~20 ft mobility, maintains hunched posture, cues for safe RW use. SBA hair brushing standing sink side, tolerates ~8 min standing. Pt making good progress toward goals, will continue to follow POC. Discharge recommendation remains appropriate.     Recommendations for follow up therapy are one component of a multi-disciplinary discharge planning process, led by the attending physician.  Recommendations may be updated based on patient status, additional functional criteria and insurance authorization.    Follow Up Recommendations  Skilled nursing-short term rehab (<3 hours/day)     Assistance Recommended at Discharge Frequent or constant Supervision/Assistance  Patient can return home with the following  A lot of help with walking and/or transfers;A lot of help with bathing/dressing/bathroom;Assistance with cooking/housework;Assist for transportation;Help with stairs or ramp for entrance;Direct supervision/assist for medications management   Equipment Recommendations  BSC/3in1    Recommendations for Other Services      Precautions / Restrictions Precautions Precautions: Fall Restrictions Weight Bearing Restrictions: Yes RLE Weight Bearing: Weight bearing as tolerated Other Position/Activity Restrictions: in CAM boot only, NWB out of boot       Mobility Bed Mobility Overal bed mobility: Needs Assistance Bed Mobility: Supine to Sit, Sit to Supine     Supine to sit: Min guard Sit to supine: Min  assist        Transfers Overall transfer level: Needs assistance Equipment used: Rolling walker (2 wheels) Transfers: Sit to/from Stand Sit to Stand: Min assist                 Balance Overall balance assessment: Needs assistance Sitting-balance support: Feet supported Sitting balance-Leahy Scale: Good     Standing balance support: Single extremity supported, During functional activity Standing balance-Leahy Scale: Fair                             ADL either performed or assessed with clinical judgement   ADL Overall ADL's : Needs assistance/impaired                                       General ADL Comments: MAX A don CAM boot seated EOB. CGA + RW toilet t/f. SBA hair brushing standing sink side, tolerates ~8 min standing.      Cognition Arousal/Alertness: Awake/alert Behavior During Therapy: WFL for tasks assessed/performed Overall Cognitive Status: Within Functional Limits for tasks assessed                                                     Pertinent Vitals/ Pain       Pain Assessment Pain Assessment: No/denies pain   Frequency  Min 2X/week        Progress  Toward Goals  OT Goals(current goals can now be found in the care plan section)  Progress towards OT goals: Progressing toward goals  Acute Rehab OT Goals Patient Stated Goal: to go home OT Goal Formulation: With patient Time For Goal Achievement: 03/13/22 Potential to Achieve Goals: Good ADL Goals Pt Will Perform Grooming: with modified independence;standing Pt Will Perform Lower Body Dressing: with modified independence;sit to/from stand Pt Will Transfer to Toilet: with modified independence;ambulating Pt Will Perform Toileting - Clothing Manipulation and hygiene: with modified independence;sit to/from stand  Plan Discharge plan remains appropriate;Frequency remains appropriate    Co-evaluation                 AM-PAC OT "6  Clicks" Daily Activity     Outcome Measure   Help from another person eating meals?: None Help from another person taking care of personal grooming?: A Little Help from another person toileting, which includes using toliet, bedpan, or urinal?: A Lot Help from another person bathing (including washing, rinsing, drying)?: A Lot Help from another person to put on and taking off regular upper body clothing?: None Help from another person to put on and taking off regular lower body clothing?: A Lot 6 Click Score: 17    End of Session    OT Visit Diagnosis: Unsteadiness on feet (R26.81);Muscle weakness (generalized) (M62.81);Pain;History of falling (Z91.81) Pain - Right/Left: Right Pain - part of body: Leg   Activity Tolerance Patient tolerated treatment well   Patient Left in bed;with call bell/phone within reach;with bed alarm set   Nurse Communication          Time: 3875-6433 OT Time Calculation (min): 22 min  Charges: OT General Charges $OT Visit: 1 Visit OT Treatments $Self Care/Home Management : 8-22 mins  Dessie Coma, M.S. OTR/L  03/02/22, 3:26 PM  ascom [phone]

## 2022-03-02 NOTE — Discharge Summary (Signed)
Physician Discharge Summary   Patient: Laurie Hale MRN: [id number] DOB: 25-Jun-1940  Admit date:     02/25/2022  Anticipated discharge date: 03/03/22  Discharge Physician: Annita Brod   PCP: Juluis Pitch, MD   Recommendations at discharge:   Medication change: Norvasc increased from 5 mg to 10 mg daily New medication: Niferex-150 p.o. daily New medication: Keflex 500 mg p.o. 4 times a day x 8 days New medication: Robaxin 500 mg every 8 hours as needed for muscle spasms New medication: Multivitamin p.o. daily Medication change: Patient previously had been on MS Contin 100 mg extended release 12-hour tablet every 8 hours as needed for pain.  This medicine has been discontinued. Medication change: Patient previously had been on MS IR 15 mg every 6 hours as needed for pain.  This medication has been discontinued New medication: Oxy IR 5 mg every 4 hours as needed for pain New medication: Seroquel 25 mg p.o. nightly New medication: Requip 0.25 mg p.o. x 7 days, then increase to 0.5 mg p.o. nightly.  Please note this medication can be titrated up further and also increased in frequency to twice daily or 3 times daily dosing for restless leg New medication: Sodium bicarb 650 mg p.o. daily New medication: Zinc 220 mg p.o. twice daily x 12 more days. Patient will follow-up with urology in 2 weeks.  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 *  Hospital Course: Laurie Hale is a 82 y.o. female with medical history significant of left ankle fracture on cast, hypertension, hyperlipidemia, CAD, hypothyroidism, GERD, depression, breast cancer, chronic back pain, who presents with hematuria, abdominal pain and fall.  Patient has across hematuria, urine culture was sent out, she is placed on IV antibiotics for UTI.  CT abdomen pelvis and a CT cystogram showed evidence of ruptured bladder, and bladder wall hematoma.  Urology is consulted, patient would prefer conservative treatment.  Foley catheter was anchored to decompress the bladder.  Urine culture came back with Proteus, patient is on Rocephin.  Can change to oral Keflex at time of discharge, but prefer to treat at least 10 days with antibiotics.  Currently pending nursing home placement.  Assessment and Plan: Acute cystitis with hematuria secondary to Proteus mirabilis. Ruptured urinary bladder. Fortunately, patient did not develop sepsis.  Currently she is hemodynamically stable.  Patient has been seen by urology, determined to continue with conservative treatment.  Foley catheter anchored to decompress the bladder.  Patient be followed closely. Urine culture came back with [name].  Given ruptured urinary bladder, patient could have some peritonitis, but currently patient does not seem to have any signs of peritonitis.  We will treat with 10 days of antibiotics total.  Change to Keflex upon discharge. Spoke with urology, patient need to keep the foley l for 2 weeks.  Outpatient follow-up with urology  Bladder hematoma. Acute blood loss anemia. Iron deficient anemia. Gross hematuria. Patient has a significant iron deficiency, received IV iron.  Continue oral iron.  Hematuria is improving, hemoglobin has been stabilized.    Acute kidney injury. Metabolic acidosis. Hypokalemia. Function has normalized, metabolic acidosis better, will continue 1 more day of oral sodium bicarb, discontinue tomorrow. Replete potassium.   Left leg pain. Duplex ultrasound to rule out a DVT and peripheral vascular disease.  Pain seems to be better, most likely secondary to neuropathy.  Patient previously had been on Requip at 1 point.  Titrate up dosing accordingly.   Fall at home.   Chronic back pain. Recent right ankle fracture Right foot pressure ulcer stage 3 due to cast. PT/OT. Repeat x-ray of right ankle still has displaced the fracture, cast removed by orthopedics, patient developed stage III pressure ulcer on right foot, discussed with podiatry and orthopedics, patient will be followed by wound care.   Abnormal LFTs. 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 Acute hepatitis panel negative.   Essential hypertension. Coronary disease. Dyslipidemia. Increased amlodipine. added back lisinopril   Obesity with BMI of 33.99. Diet and exercise.   Bacterial vaginitis. Completed course of Flagyl.             Pain control - [employer] Controlled Substance Reporting System database was reviewed. and patient was instructed, not to drive, operate heavy machinery, perform activities at heights, swimming or participation in water activities or provide baby-sitting services while on Pain, Sleep and Anxiety Medications; until their outpatient Physician has advised to do so again. Also recommended to not to take more than prescribed Pain, Sleep and Anxiety Medications.  Consultants: Urology Procedures performed: None Disposition: Skilled nursing Diet recommendation:  Heart healthy with Ensure twice daily between meals DISCHARGE MEDICATION: Allergies as of 03/02/2022       Reactions   Ciprofloxacin    Penicillins Hives            Medication List     STOP taking these medications    meloxicam 7.5 MG  tablet Commonly known as: MOBIC   morphine 100 MG 12 hr tablet Commonly known as: MS CONTIN   morphine 15 MG tablet Commonly known as: MSIR       TAKE these medications    amLODipine 5 MG tablet Commonly known as: NORVASC Take 5 mg by mouth daily.   ascorbic acid 500 MG tablet Commonly known as: VITAMIN C Take 1 tablet (500 mg total) by mouth 2 (two) times daily.   cephALEXin 500 MG capsule Commonly known as: KEFLEX Take 1 capsule (500 mg total) by mouth 4 (four) times daily for 8 days.   feeding supplement Liqd Take 237 mLs by mouth 2 (two) times daily between meals. Start taking on: March 03, 2022   iron polysaccharides 150 MG capsule Commonly known as: NIFEREX Take 1 capsule (150 mg total) by mouth daily. Start taking on: March 03, 2022   levothyroxine 88 MCG tablet Commonly known as: SYNTHROID Take 88 mcg by mouth at bedtime.   lisinopril 40 MG tablet Commonly known as: ZESTRIL Take 1 tablet (40 mg total) by mouth daily.   methocarbamol 500 MG tablet Commonly known as: ROBAXIN Take 1 tablet (500 mg total) by mouth every 8 (eight) hours as needed for muscle spasms.   multivitamin with minerals Tabs tablet Take 1 tablet by mouth  daily. Start taking on: March 03, 2022   oxyCODONE 5 MG immediate release tablet Commonly known as: Oxy IR/ROXICODONE Take 1 tablet (5 mg total) by mouth every 4 (four) hours as needed for moderate pain.   QUEtiapine 25 MG tablet Commonly known as: SEROQUEL Take 1 tablet (25 mg total) by mouth at bedtime.   rOPINIRole 0.25 MG tablet Commonly known as: REQUIP Take 1 tablet (0.25 mg total) by mouth at bedtime for 7 days, THEN 2 tablets (0.5 mg total) at bedtime. Start taking on: March 02, 2022   rosuvastatin 20 MG tablet Commonly known as: CRESTOR Take 20 mg by mouth every Monday, Wednesday, and Friday.   senna-docusate 8.6-50 MG tablet Commonly known as: Senokot-S Take 2 tablets by mouth 2 (two) times daily.    sertraline 100 MG tablet Commonly known as: ZOLOFT Take 1.5 tablets (150 mg total) by mouth daily. Take one and a half (1.5) tablets by mouth once daily   sodium bicarbonate 650 MG tablet Take 1 tablet (650 mg total) by mouth daily.   zinc sulfate 220 (50 Zn) MG capsule Take 1 capsule (220 mg total) by mouth daily. Start taking on: March 03, 2022        Contact information for follow-up providers     Juluis Pitch, MD Follow up.   Specialty: Family Medicine Contact information: [address] [address] [phone]              Contact information for after-discharge care     [facility] .   Service: Skilled Nursing Contact information: [place] [phone]                    Discharge Exam: Danley Danker Weights   02/25/22 0719  Weight: 81.6 kg   General: Alert and oriented x 2, no acute distress Cardiovascular: Regular rate and rhythm, S1-S2 Lungs: Clear to auscultation bilaterally  Condition at discharge: improving  The results of significant diagnostics from this hospitalization (including imaging, microbiology, ancillary and laboratory) are listed below for reference.   Imaging Studies: US ARTERIAL LOWER EXTREMITY DUPLEX LEFT (NON-ABI)  Result Date: 03/01/2022 CLINICAL DATA:  Peripheral arterial disease Hypertension EXAM: LEFT LOWER EXTREMITY ARTERIAL DUPLEX SCAN TECHNIQUE: Gray-scale sonography as well as color Doppler and duplex ultrasound was performed to evaluate the lower extremity arteries including the common, superficial and profunda femoral arteries, popliteal artery and calf arteries. COMPARISON:  None available FINDINGS: Left lower Extremity Inflow: Normal common femoral arterial waveforms and velocities. No evidence of inflow (aortoiliac) disease. Outflow: Normal profunda  femoral, superficial femoral and popliteal arterial waveforms and velocities. No focal elevation of the PSV to suggest stenosis. Runoff: Normal posterior and anterior tibial arterial waveforms and velocities. Vessels are patent to the ankle. posterior and anterior tibial arterial waveforms and velocities. Vessels are patent to the ankle. IMPRESSION: Patent left lower extremity arteries without significant stenosis or occlusion. Electronically Signed   By: Miachel Roux M.D.   On: 03/01/2022 08:49   US Venous Img Lower Unilateral Left (DVT)  Result Date: 02/28/2022 CLINICAL DATA:  Left lower extremity pain. History of varicose veins. Evaluate for DVT. EXAM: LEFT LOWER EXTREMITY VENOUS DOPPLER ULTRASOUND TECHNIQUE: Gray-scale sonography with graded compression, as well as color Doppler and duplex ultrasound were performed to evaluate the lower extremity deep venous systems from the level of the common femoral  vein and including the common femoral, femoral, profunda femoral, popliteal and calf veins including the posterior tibial, peroneal and gastrocnemius veins when visible. The superficial great saphenous vein was also interrogated. Spectral Doppler was utilized to evaluate flow at rest and with distal augmentation maneuvers in the common femoral, femoral and popliteal veins. COMPARISON:  None Available. FINDINGS: Contralateral Common Femoral Vein: Respiratory phasicity is normal and symmetric with the symptomatic side. No evidence of thrombus. Normal compressibility. Common Femoral Vein: No evidence of thrombus. Normal compressibility, respiratory phasicity and response to augmentation. Saphenofemoral Junction: No evidence of thrombus. Normal compressibility and flow on color Doppler imaging. Profunda Femoral Vein: No evidence of thrombus. Normal compressibility and flow on color Doppler imaging. Femoral Vein: No evidence of thrombus. Normal compressibility, respiratory phasicity and response to augmentation.  Popliteal Vein: No evidence of thrombus. Normal compressibility, respiratory phasicity and response to augmentation. Calf Veins: No evidence of thrombus. Normal compressibility and flow on color Doppler imaging. Superficial Great Saphenous Vein: No evidence of thrombus. Normal compressibility. Other Findings:  None. IMPRESSION: No evidence of DVT within the left lower extremity. Electronically Signed   By: Sandi Mariscal M.D.   On: 02/28/2022 11:45   DG Ankle 2 Views Right  Result Date: 02/26/2022 CLINICAL DATA:  Fracture EXAM: RIGHT ANKLE - 2 VIEW COMPARISON:  None Available. FINDINGS: Images through cast demonstrate oblique fracture lateral malleolus and transverse fracture medial malleolus displaced by several mm. Fine bony details obscured by the overlying cast. IMPRESSION: Ankle eversion injury with fractures medial and lateral malleolus. Electronically Signed   By: Sammie Bench M.D.   On: 02/26/2022 09:51   CT CYSTOGRAM PELVIS  Result Date: 02/25/2022 CLINICAL DATA:  Fall out of bed. Abnormal CT of the abdomen and pelvis. EXAM: CT CYSTOGRAM (CT PELVIS WITH CONTRAST) TECHNIQUE: Multidetector CT imaging through the pelvis was performed after dilute contrast had been introduced into the bladder for the purposes of performing CT cystography. RADIATION DOSE REDUCTION: This exam was performed according to the departmental dose-optimization program which includes automated exposure control, adjustment of the mA and/or kV according to patient size and/or use of iterative reconstruction technique. CONTRAST:  250 mL Cysto-Conray was instilled via a Foley catheter. COMPARISON:  CT of the abdomen pelvis 02/25/2022 FINDINGS: Urinary Tract: Bladder wall rupture is noted superiorly into the left. Hyperdense contrast is seen at the rupture site with more diluted contrast collecting about the urinary bladder. Filling defect is noted more superiorly in the urinary bladder than on the prior exam compatible with  hematoma. No mass lesion is present. Bowel:  Unremarkable. Vascular/Lymphatic: Atherosclerotic calcifications again noted. No significant retroperitoneal adenopathy. Right inguinal node again noted. Reproductive:  Status post hysterectomy.  No adnexal mass. Musculoskeletal: Postoperative changes are present in the lumbar spine. No acute fractures are present. IMPRESSION: 1. Acute superior bladder wall rupture with extravasation of contrast. 2. Filling defect more superiorly in the urinary bladder than on the prior exam compatible with hematoma. Critical Value/emergent results were called by telephone at the time of interpretation on 02/25/2022 at 2:23 pm to provider Dr. Charna Archer, ED physician, who verbally acknowledged these results. Electronically Signed   By: San Morelle M.D.   On: 02/25/2022 14:24   CT Abdomen Pelvis W Contrast  Addendum Date: 02/25/2022   ADDENDUM REPORT: 02/25/2022 12:40 ADDENDUM: Upon further review, in the setting of trauma, bladder rupture is also within the differential and can be evaluated with cystogram. Electronically Signed   By: Darrin Nipper M.D.   On:  02/25/2022 12:40   Result Date: 02/25/2022 CLINICAL DATA:  Fall out of bed with chronic back pain EXAM: CT ABDOMEN AND PELVIS WITH CONTRAST TECHNIQUE: Multidetector CT imaging of the abdomen and pelvis was performed using the standard protocol following bolus administration of intravenous contrast. RADIATION DOSE REDUCTION: This exam was performed according to the departmental dose-optimization program which includes automated exposure control, adjustment of the mA and/or kV according to patient size and/or use of iterative reconstruction technique. CONTRAST:  39m OMNIPAQUE IOHEXOL 300 MG/ML  SOLN COMPARISON:  CT lumbar spine dated 02/03/2017 FINDINGS: Lower chest: No focal consolidation or pulmonary nodule in the lung bases. No pleural effusion or pneumothorax demonstrated. Partially imaged heart size is normal. Coronary  artery calcifications. Hepatobiliary: No focal hepatic lesions. No intra or extrahepatic biliary ductal dilation. Normal gallbladder. Pancreas: No focal lesions or main ductal dilation. Spleen: Subcentimeter hypoattenuating focus along the peripheral lateral spleen (3:16). Adrenals/Urinary Tract: No adrenal nodules. No suspicious renal mass, calculi or hydronephrosis. Circumferential mural thickening with surrounding stranding and free fluid. Hyperattenuating, masslike lobulation along the posterior bladder wall measures 3.6 x 1.8 cm (3:71). Stomach/Bowel: Small hiatal hernia. Normal appearance of the stomach. Duodenal diverticulum along the second portion. Mobile cecum in the right upper quadrant. No evidence of bowel wall thickening, distention, or inflammatory changes. Rectosigmoid anastomosis is patent. Colonic diverticulosis without acute diverticulitis. Status post appendectomy. Vascular/Lymphatic: Aortic atherosclerosis. Right inguinal lymph node measures 13 mm (3:76). Reproductive: No adnexal masses. Other: Small volume free fluid surrounding the urinary bladder. No fluid collection or free air. Musculoskeletal: No acute or abnormal lytic or blastic osseous lesions. Postsurgical changes from spinal fixation spanning T11-S1. Multilevel degenerative changes of the partially imaged thoracic and lumbar spine. Asymmetrically expanded appearance of the left psoas muscle, new from 02/03/2017, with associated adjacent stranding. IMPRESSION: 1. Circumferential mural thickening of the urinary bladder with surrounding stranding and free fluid. Hyperattenuating, masslike lobulation along the posterior bladder wall measures 3.6 x 1.8 cm. While this finding may reflect layering hyperattenuating debris or blood products in the setting of acute cystitis, findings are suspicious for bladder malignancy. Recommend correlation with urinalysis and ultrasound examination of the bladder to confirm mass lesion. 2. Asymmetrically  expanded left psoas muscle may reflect intramuscular hematoma in the setting of recent fall. 3. Subcentimeter hypoattenuating focus along the peripheral lateral spleen, too small to characterize. 4. Right inguinal lymph node measures 13 mm, indeterminate. 5. Small hiatal hernia. 6. Colonic diverticulosis without acute diverticulitis. 7. Coronary artery calcifications. Aortic Atherosclerosis (ICD10-I70.0). Electronically Signed: By: LDarrin NipperM.D. On: 02/25/2022 09:27   CT L-SPINE NO CHARGE  Result Date: 02/25/2022 CLINICAL DATA:  Chronic back pain.  Presenting after fall out of bed EXAM: CT Lumbar spine with contrast TECHNIQUE: Multiplanar CT images of the lumbar spine were reconstructed from contemporary CT of the Abdomen and Pelvis. RADIATION DOSE REDUCTION: This exam was performed according to the departmental dose-optimization program which includes automated exposure control, adjustment of the mA and/or kV according to patient size and/or use of iterative reconstruction technique. CONTRAST:  No additional COMPARISON:  CT lumbar spine dated 02/03/2017 FINDINGS: CT LUMBAR SPINE FINDINGS Segmentation: 5 lumbar type vertebrae. Alignment: Straightened status post posterior spinal fixation spanning T11-S1. Hardware appears intact. Vertebrae: No acute fracture or focal pathologic process. Paraspinal and other soft tissues: Aortic atherosclerosis. Small hiatal hernia. Asymmetrically expanded left psoas muscle. Partially imaged bladder wall thickening and surrounding edema. Disc levels: Similar multilevel intervertebral disc space narrowing of the imaged thoracolumbar spine. IMPRESSION: 1.  No acute fracture or traumatic listhesis of the lumbar spine. 2. Asymmetrically expanded left psoas muscle, which may represent an intramuscular hematoma. 3. Partially imaged bladder wall thickening and surrounding edema, better evaluated on concurrent CT abdomen and pelvis. 4. Small hiatal hernia. 5. Aortic Atherosclerosis  (ICD10-I70.0). Electronically Signed   By: Darrin Nipper M.D.   On: 02/25/2022 09:33   CT Head Wo Contrast  Result Date: 02/25/2022 CLINICAL DATA:  Trauma EXAM: CT HEAD WITHOUT CONTRAST CT CERVICAL SPINE WITHOUT CONTRAST TECHNIQUE: Multidetector CT imaging of the head and cervical spine was performed following the standard protocol without intravenous contrast. Multiplanar CT image reconstructions of the cervical spine were also generated. RADIATION DOSE REDUCTION: This exam was performed according to the departmental dose-optimization program which includes automated exposure control, adjustment of the mA and/or kV according to patient size and/or use of iterative reconstruction technique. 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 There is sequela of mild chronic microvascular ischemic change. No evidence of hemorrhage, hydrocephalus, extra-axial collection or mass lesion/mass effect. Vascular: No hyperdense vessel or unexpected calcification. Skull: Normal. Negative for fracture or focal lesion. Sinuses/Orbits: No mastoid or middle ear effusion. Paranasal sinuses are clear. Bilateral lens replacement. Bilateral orbits are otherwise unremarkable. Other: None. CT CERVICAL SPINE FINDINGS Alignment: Grade 1 anterolisthesis of C3 on C4 and C7 on T1. This is new compared to 2005. Skull base and vertebrae: Postsurgical changes from C5-C7 ACDF with solid osseous fusion C5-C6 incomplete fusion at C6-C7. There is also fusion at C4-C5. Soft tissues and spinal canal: No prevertebral fluid or swelling. No visible canal hematoma. Disc levels:  No CT evidence of high-grade spinal canal stenosis Upper chest: Negative. Other: None IMPRESSION: 1. No acute intracranial abnormality. 2. 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 3. No acute  cervical spine fracture. Electronically Signed   By: Marin Roberts M.D.   On: 02/25/2022 09:26   CT Cervical Spine Wo Contrast  Result Date: 02/25/2022 CLINICAL DATA:  Trauma EXAM: CT HEAD WITHOUT CONTRAST CT CERVICAL SPINE WITHOUT CONTRAST TECHNIQUE: Multidetector CT imaging of the head and cervical spine was performed following the standard protocol without intravenous contrast. Multiplanar CT image reconstructions of the cervical spine were also generated. RADIATION DOSE REDUCTION: This exam was performed according to the departmental dose-optimization program which includes automated exposure control, adjustment of the mA and/or kV according to patient size and/or use of iterative reconstruction technique. 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 There is sequela of mild chronic microvascular ischemic change. No evidence of hemorrhage, hydrocephalus, extra-axial collection or mass lesion/mass effect. Vascular: No hyperdense vessel or unexpected calcification. Skull: Normal. Negative for fracture or focal lesion. Sinuses/Orbits: No mastoid or middle ear effusion. Paranasal sinuses are clear. Bilateral lens replacement. Bilateral orbits are otherwise unremarkable. Other: None. CT CERVICAL SPINE FINDINGS Alignment: Grade 1 anterolisthesis of C3 on C4 and C7 on T1. This is new compared to 2005. Skull base and vertebrae: Postsurgical changes from C5-C7 ACDF with solid osseous fusion C5-C6 incomplete fusion at C6-C7. There is also fusion at C4-C5. Soft tissues and spinal canal: No prevertebral fluid or swelling. No visible canal hematoma. Disc levels:  No  CT evidence of high-grade spinal canal stenosis Upper chest: Negative. Other: None IMPRESSION: 1. No acute intracranial abnormality. 2. Compared to prior exam there is a new chronic appearing infarct in the left  thalamus. 3. No acute cervical spine fracture. Electronically Signed   By: Marin Roberts M.D.   On: 02/25/2022 09:26    Microbiology: Results for orders placed or performed during the hospital encounter of 02/25/22  Wet prep, genital     Status: Abnormal   Collection Time: 02/25/22  7:55 AM   Specimen: Vaginal  Result Value Ref Range Status   Yeast Wet Prep HPF POC NONE SEEN NONE SEEN Final   Trich, Wet Prep NONE SEEN NONE SEEN Final   Clue Cells Wet Prep HPF POC PRESENT (A) NONE SEEN Final   WBC, Wet Prep HPF POC >=10 (A) <10 Final   Sperm NONE SEEN  Final    Comment: Performed at [facility], [address] [place], [place] [address]  Urine Culture     Status: Abnormal   Collection Time: 02/25/22  9:07 AM   Specimen: Urine, Clean Catch  Result Value Ref Range Status   Specimen Description   Final    URINE, CLEAN CATCH Performed at [facility], [address] [place], [place] [address]    Special Requests   Final    NONE Performed at [facility] [facility], [address] [place], [place] [address]    Culture >=100,000 COLONIES/mL PROTEUS MIRABILIS (A)  Final   Report Status 02/27/2022 FINAL  Final   Organism ID, Bacteria PROTEUS MIRABILIS (A)  Final      Susceptibility   Proteus mirabilis - MIC*    AMPICILLIN <=2 SENSITIVE Sensitive     CEFAZOLIN <=4 SENSITIVE Sensitive     CEFEPIME <=0.12 SENSITIVE Sensitive     CEFTRIAXONE <=0.25 SENSITIVE Sensitive     CIPROFLOXACIN <=0.25 SENSITIVE Sensitive     GENTAMICIN <=1 SENSITIVE Sensitive     IMIPENEM 1 SENSITIVE Sensitive     NITROFURANTOIN 128 RESISTANT Resistant     TRIMETH/SULFA <=20 SENSITIVE Sensitive     AMPICILLIN/SULBACTAM <=2 SENSITIVE Sensitive     PIP/TAZO <=4 SENSITIVE Sensitive     * >=100,000 COLONIES/mL PROTEUS MIRABILIS    Labs: CBC: Recent Labs  Lab 02/25/22 0737 02/26/22 0455 02/27/22 0433 02/28/22 0714 03/01/22 0432 03/02/22 0444  WBC 9.3 8.2 8.9 7.4 7.7 8.4  NEUTROABS  7.8*  --   --   --   --   --   HGB 11.3* 8.7* 9.1* 9.3* 9.5* 10.0*  HCT 35.7* 27.2* 29.0* 28.8* 29.0* 31.2*  MCV 91.3 89.2 90.3 88.9 87.3 89.7  PLT 314 267 248 226 289 400   Basic Metabolic Panel: Recent Labs  Lab 02/26/22 0455 02/27/22 0433 02/28/22 0714 03/01/22 0432 03/02/22 0444  NA 137 137 136 136 140  K 3.6 3.7 3.7 3.2* 3.8  CL 109 108 108 106 109  CO2 19* 21* 20* 22 21*  GLUCOSE 117* 129* 106* 107* 125*  BUN 33* 29* '18 17 19  '$ CREATININE 0.98 0.93 0.68 0.72 0.68  CALCIUM 8.2* 8.3* 8.2* 8.6* 9.0  MG  --  2.1 2.1  --  2.1   Liver Function Tests: Recent Labs  Lab 02/25/22 0737 02/26/22 0455  AST 67* 65*  ALT 65* 50*  ALKPHOS 119 80  BILITOT 1.0 0.8  PROT 7.4 6.1*  ALBUMIN 3.2* 2.6*   CBG: No results for input(s): "GLUCAP" in the last 168 hours.  Discharge time spent: greater than 30 minutes.  Signed: Annita Brod, MD [facility] 03/02/2022

## 2022-03-03 ENCOUNTER — Other Ambulatory Visit: Payer: Self-pay

## 2022-03-03 NOTE — TOC Transition Note (Signed)
Transition of Care [facility]) - CM/SW Discharge Note   Patient Details  Name: Laurie Hale MRN: [id number] Date of Birth: 12/18/1940  [facility] CM/SW Contact:  Gerilyn Pilgrim, LCSW Phone Number: 03/03/2022, 9:37 AM   Clinical Narrative:  Pt ready to be discharged to [facility]. RN given number for report. Daughter informed. Daughter wanted to set up [facility], informed daughter she may receive a bill for these services she would still like to proceed. Medical necessity form printed to the unit. CSW will arrange ambulance once RN has called report. CSW signing off.     Final next level of care: Skilled Nursing Facility Barriers to Discharge: Barriers Resolved   Patient Goals and CMS Choice CMS Medicare.gov Compare Post Acute Care list provided to:: Patient Choice offered to / list presented to : Patient, Adult Children  Discharge Placement                Patient chooses bed at: [facility] Patient to be transferred to facility by: [facility] Name of family member notified: Daughter terri Patient and family notified of of transfer: 03/03/22  Discharge Plan and Services Additional resources added to the After Visit Summary for                                       Social Determinants of Health (SDOH) Interventions SDOH Screenings   Food Insecurity: No Food Insecurity (02/25/2022)  Housing: Low Risk  (02/25/2022)  Transportation Needs: No Transportation Needs (02/25/2022)  Utilities: Not At Risk (02/25/2022)  Tobacco Use: Low Risk  (02/26/2022)     Readmission Risk Interventions     No data to display

## 2022-03-03 NOTE — Progress Notes (Signed)
PT AVS reviewed with receiving facility, [facility]. IV removed without complications. Pt has all her belongings in her possession and will be going by EMS.

## 2022-03-03 NOTE — Discharge Summary (Signed)
Physician Discharge Summary   Patient: Laurie Hale MRN: [id number] DOB: 30-Jun-1940  Admit date:     02/25/2022  Discharge date: 03/03/22  Discharge Physician: Annita Brod   PCP: Juluis Pitch, MD   Recommendations at discharge:   Medication change: Norvasc increased from 5 mg to 10 mg daily New medication: Niferex-150 p.o. daily New medication: Keflex 500 mg p.o. 4 times a day x 8 days New medication: Robaxin 500 mg every 8 hours as needed for muscle spasms New medication: Multivitamin p.o. daily Medication change: Patient previously had been on MS Contin 100 mg extended release 12-hour tablet every 8 hours as needed for pain.  This medicine has been discontinued. Medication change: Patient previously had been on MS IR 15 mg every 6 hours as needed for pain.  This medication has been discontinued New medication: Oxy IR 5 mg every 4 hours as needed for pain New medication: Seroquel 25 mg p.o. nightly New medication: Requip 0.25 mg p.o. x 7 days, then increase to 0.5 mg p.o. nightly.  Please note this medication can be titrated up further and also increased in frequency to twice daily or 3 times daily dosing for restless leg New medication: Sodium bicarb 650 mg p.o. daily New medication: Zinc 220 mg p.o. twice daily x 12 more days. Patient will follow-up with urology in 2 weeks.  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 *  Hospital Course: Laurie Hale is a 82 y.o. female with medical history significant of left ankle fracture on cast, hypertension, hyperlipidemia, CAD, hypothyroidism, GERD, depression, breast cancer, chronic back pain, who presents with hematuria, abdominal pain and fall.  Patient has across hematuria, urine culture was sent out, she is placed on IV antibiotics for UTI.  CT abdomen pelvis and a CT cystogram showed evidence of ruptured bladder, and bladder wall hematoma.  Urology is consulted, patient would prefer conservative treatment.  Foley catheter was anchored to decompress the bladder.  Urine culture came back with Laurie, patient is on Rocephin.  Can change to oral Keflex at time of discharge, but prefer to treat at least 10 days with antibiotics.  Nursing home bed available 1/18 and patient to be transferred.  Assessment and Plan: Acute cystitis with hematuria secondary to Laurie mirabilis. Ruptured urinary bladder. Fortunately, patient did not develop sepsis.  Currently she is hemodynamically stable.  Patient has been seen by urology, determined to continue with conservative treatment.  Foley catheter anchored to decompress the bladder.  Patient be followed closely. Urine culture came back with Laurie Hale.  Given ruptured urinary bladder, patient could have some peritonitis, but currently patient does not seem to have any signs of peritonitis.  We will treat with 10 days  of antibiotics total.  Change to Keflex upon discharge. Spoke with urology, patient need to keep the foley l for 2 weeks.  Outpatient follow-up with urology  Bladder hematoma. Acute blood loss anemia. Iron deficient anemia. Gross hematuria. Patient has a significant iron deficiency, received IV iron.  Continue oral iron.  Hematuria is improving, hemoglobin has been stabilized.    Acute kidney injury. Metabolic acidosis. Hypokalemia. Function has normalized, metabolic acidosis better, will continue 1 more day of oral sodium bicarb, discontinue tomorrow. Replete potassium.   Left leg pain. Duplex ultrasound to rule out a DVT and peripheral vascular disease.  Pain seems to be better, most likely secondary to neuropathy.  Patient previously had been on Requip at 1 point.  Titrate up dosing accordingly.   Fall at home.   Chronic back pain. Recent right ankle fracture Right foot pressure ulcer stage 3 due to cast. PT/OT. Repeat x-ray of right ankle still has displaced the fracture, cast removed by orthopedics, patient developed stage III pressure ulcer on right foot, discussed with podiatry and orthopedics, patient will be followed by wound care.   Abnormal LFTs. 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 Acute hepatitis panel negative.   Essential hypertension. Coronary disease. Dyslipidemia. Increased amlodipine. added back lisinopril   Obesity with BMI of 33.99. Diet and exercise.   Bacterial vaginitis. Completed course of Flagyl.             Pain control - [employer] Controlled Substance Reporting System database was reviewed. and patient was instructed, not to drive, operate heavy machinery, perform activities at heights, swimming or participation in water activities or provide baby-sitting services while on Pain, Sleep and Anxiety Medications; until their outpatient Physician has advised to do so again. Also recommended to not to take more than prescribed Pain, Sleep and Anxiety Medications.  Consultants: Urology Procedures performed: None Disposition: Skilled nursing Diet recommendation:  Heart healthy with Ensure twice daily between meals DISCHARGE MEDICATION: Allergies as of 03/03/2022       Reactions   Ciprofloxacin    Penicillins Hives            Medication List     STOP taking these medications    meloxicam 7.5 MG  tablet Commonly known as: MOBIC   morphine 100 MG 12 hr tablet Commonly known as: MS CONTIN   morphine 15 MG tablet Commonly known as: MSIR       TAKE these medications    amLODipine 10 MG tablet Commonly known as: NORVASC Take 1 tablet (10 mg total) by mouth daily. What changed:  medication strength how much to take   ascorbic acid 500 MG tablet Commonly known as: VITAMIN C Take 1 tablet (500 mg total) by mouth 2 (two) times daily.   cephALEXin 500 MG capsule Commonly known as: KEFLEX Take 1 capsule (500 mg total) by mouth 4 (four) times daily for 8 days.   feeding supplement Liqd Take 237 mLs by mouth 2 (two) times daily between meals.   iron polysaccharides 150 MG capsule Commonly known as: NIFEREX Take 1 capsule (150 mg total) by mouth daily.   levothyroxine 88 MCG tablet Commonly known as: SYNTHROID Take 88 mcg by mouth at bedtime.   lisinopril 40 MG tablet Commonly known as: ZESTRIL Take 1 tablet (40 mg total) by mouth daily.   methocarbamol 500 MG tablet Commonly known as: ROBAXIN Take 1 tablet (500 mg total) by mouth every 8 (eight) hours as needed for muscle spasms.   multivitamin with minerals Tabs tablet Take  1 tablet by mouth daily.   oxyCODONE 5 MG immediate release tablet Commonly known as: Oxy IR/ROXICODONE Take 1 tablet (5 mg total) by mouth every 4 (four) hours as needed for moderate pain.   QUEtiapine 25 MG tablet Commonly known as: SEROQUEL Take 1 tablet (25 mg total) by mouth at bedtime.   rOPINIRole 0.25 MG tablet Commonly known as: REQUIP Take 1 tablet (0.25 mg total) by mouth at bedtime for 7 days, THEN 2 tablets (0.5 mg total) at bedtime. Start taking on: March 02, 2022   rosuvastatin 20 MG tablet Commonly known as: CRESTOR Take 20 mg by mouth every Monday, Wednesday, and Friday.   senna-docusate 8.6-50 MG tablet Commonly known as: Senokot-S Take 2 tablets by mouth 2 (two) times daily.   sertraline 100 MG  tablet Commonly known as: ZOLOFT Take 1.5 tablets (150 mg total) by mouth daily. Take one and a half (1.5) tablets by mouth once daily   sodium bicarbonate 650 MG tablet Take 1 tablet (650 mg total) by mouth daily.   zinc sulfate 220 (50 Zn) MG capsule Take 1 capsule (220 mg total) by mouth daily.        Contact information for follow-up providers     Juluis Pitch, MD Follow up.   Specialty: Family Medicine Contact information: [address] [address] [phone]              Contact information for after-discharge care     [facility] .   Service: Skilled Nursing Contact information: [address] [place] [phone]                    Discharge Exam: Danley Danker Weights   02/25/22 0719  Weight: 81.6 kg   General: Alert and oriented x 2, no acute distress Cardiovascular: Regular rate and rhythm, S1-S2 Lungs: Clear to auscultation bilaterally  Condition at discharge: improving  The results of significant diagnostics from this hospitalization (including imaging, microbiology, ancillary and laboratory) are listed below for reference.   Imaging Studies: US ARTERIAL LOWER EXTREMITY DUPLEX LEFT (NON-ABI)  Result Date: 03/01/2022 CLINICAL DATA:  Peripheral arterial disease Hypertension EXAM: LEFT LOWER EXTREMITY ARTERIAL DUPLEX SCAN TECHNIQUE: Gray-scale sonography as well as color Doppler and duplex ultrasound was performed to evaluate the lower extremity arteries including the common, superficial and profunda femoral arteries, popliteal artery and calf arteries. COMPARISON:  None available FINDINGS: Left lower Extremity Inflow: Normal common femoral arterial waveforms and velocities. No evidence of inflow (aortoiliac) disease. Outflow: Normal profunda femoral, superficial femoral and popliteal arterial  waveforms and velocities. No focal elevation of the PSV to suggest stenosis. Runoff: Normal posterior and anterior tibial arterial waveforms and velocities. Vessels are patent to the ankle. posterior and anterior tibial arterial waveforms and velocities. Vessels are patent to the ankle. IMPRESSION: Patent left lower extremity arteries without significant stenosis or occlusion. Electronically Signed   By: Miachel Roux M.D.   On: 03/01/2022 08:49   US Venous Img Lower Unilateral Left (DVT)  Result Date: 02/28/2022 CLINICAL DATA:  Left lower extremity pain. History of varicose veins. Evaluate for DVT. EXAM: LEFT LOWER EXTREMITY VENOUS DOPPLER ULTRASOUND TECHNIQUE: Gray-scale sonography with graded compression, as well as color Doppler and duplex ultrasound were performed to evaluate the lower extremity deep venous systems from the level of the common femoral vein and including the common femoral, femoral, profunda  femoral, popliteal and calf veins including the posterior tibial, peroneal and gastrocnemius veins when visible. The superficial great saphenous vein was also interrogated. Spectral Doppler was utilized to evaluate flow at rest and with distal augmentation maneuvers in the common femoral, femoral and popliteal veins. COMPARISON:  None Available. FINDINGS: Contralateral Common Femoral Vein: Respiratory phasicity is normal and symmetric with the symptomatic side. No evidence of thrombus. Normal compressibility. Common Femoral Vein: No evidence of thrombus. Normal compressibility, respiratory phasicity and response to augmentation. Saphenofemoral Junction: No evidence of thrombus. Normal compressibility and flow on color Doppler imaging. Profunda Femoral Vein: No evidence of thrombus. Normal compressibility and flow on color Doppler imaging. Femoral Vein: No evidence of thrombus. Normal compressibility, respiratory phasicity and response to augmentation. Popliteal Vein: No evidence of thrombus. Normal  compressibility, respiratory phasicity and response to augmentation. Calf Veins: No evidence of thrombus. Normal compressibility and flow on color Doppler imaging. Superficial Great Saphenous Vein: No evidence of thrombus. Normal compressibility. Other Findings:  None. IMPRESSION: No evidence of DVT within the left lower extremity. Electronically Signed   By: Sandi Mariscal M.D.   On: 02/28/2022 11:45   DG Ankle 2 Views Right  Result Date: 02/26/2022 CLINICAL DATA:  Fracture EXAM: RIGHT ANKLE - 2 VIEW COMPARISON:  None Available. FINDINGS: Images through cast demonstrate oblique fracture lateral malleolus and transverse fracture medial malleolus displaced by several mm. Fine bony details obscured by the overlying cast. IMPRESSION: Ankle eversion injury with fractures medial and lateral malleolus. Electronically Signed   By: Sammie Bench M.D.   On: 02/26/2022 09:51   CT CYSTOGRAM PELVIS  Result Date: 02/25/2022 CLINICAL DATA:  Fall out of bed. Abnormal CT of the abdomen and pelvis. EXAM: CT CYSTOGRAM (CT PELVIS WITH CONTRAST) TECHNIQUE: Multidetector CT imaging through the pelvis was performed after dilute contrast had been introduced into the bladder for the purposes of performing CT cystography. RADIATION DOSE REDUCTION: This exam was performed according to the departmental dose-optimization program which includes automated exposure control, adjustment of the mA and/or kV according to patient size and/or use of iterative reconstruction technique. CONTRAST:  250 mL Cysto-Conray was instilled via a Foley catheter. COMPARISON:  CT of the abdomen pelvis 02/25/2022 FINDINGS: Urinary Tract: Bladder wall rupture is noted superiorly into the left. Hyperdense contrast is seen at the rupture site with more diluted contrast collecting about the urinary bladder. Filling defect is noted more superiorly in the urinary bladder than on the prior exam compatible with hematoma. No mass lesion is present. Bowel:   Unremarkable. Vascular/Lymphatic: Atherosclerotic calcifications again noted. No significant retroperitoneal adenopathy. Right inguinal node again noted. Reproductive:  Status post hysterectomy.  No adnexal mass. Musculoskeletal: Postoperative changes are present in the lumbar spine. No acute fractures are present. IMPRESSION: 1. Acute superior bladder wall rupture with extravasation of contrast. 2. Filling defect more superiorly in the urinary bladder than on the prior exam compatible with hematoma. Critical Value/emergent results were called by telephone at the time of interpretation on 02/25/2022 at 2:23 pm to provider Dr. Charna Archer, ED physician, who verbally acknowledged these results. Electronically Signed   By: San Morelle M.D.   On: 02/25/2022 14:24   CT Abdomen Pelvis W Contrast  Addendum Date: 02/25/2022   ADDENDUM REPORT: 02/25/2022 12:40 ADDENDUM: Upon further review, in the setting of trauma, bladder rupture is also within the differential and can be evaluated with cystogram. Electronically Signed   By: Darrin Nipper M.D.   On: 02/25/2022 12:40   Result Date: 02/25/2022 CLINICAL  DATA:  Fall out of bed with chronic back pain EXAM: CT ABDOMEN AND PELVIS WITH CONTRAST TECHNIQUE: Multidetector CT imaging of the abdomen and pelvis was performed using the standard protocol following bolus administration of intravenous contrast. RADIATION DOSE REDUCTION: This exam was performed according to the departmental dose-optimization program which includes automated exposure control, adjustment of the mA and/or kV according to patient size and/or use of iterative reconstruction technique. CONTRAST:  38m OMNIPAQUE IOHEXOL 300 MG/ML  SOLN COMPARISON:  CT lumbar spine dated 02/03/2017 FINDINGS: Lower chest: No focal consolidation or pulmonary nodule in the lung bases. No pleural effusion or pneumothorax demonstrated. Partially imaged heart size is normal. Coronary artery calcifications. Hepatobiliary: No focal  hepatic lesions. No intra or extrahepatic biliary ductal dilation. Normal gallbladder. Pancreas: No focal lesions or main ductal dilation. Spleen: Subcentimeter hypoattenuating focus along the peripheral lateral spleen (3:16). Adrenals/Urinary Tract: No adrenal nodules. No suspicious renal mass, calculi or hydronephrosis. Circumferential mural thickening with surrounding stranding and free fluid. Hyperattenuating, masslike lobulation along the posterior bladder wall measures 3.6 x 1.8 cm (3:71). Stomach/Bowel: Small hiatal hernia. Normal appearance of the stomach. Duodenal diverticulum along the second portion. Mobile cecum in the right upper quadrant. No evidence of bowel wall thickening, distention, or inflammatory changes. Rectosigmoid anastomosis is patent. Colonic diverticulosis without acute diverticulitis. Status post appendectomy. Vascular/Lymphatic: Aortic atherosclerosis. Right inguinal lymph node measures 13 mm (3:76). Reproductive: No adnexal masses. Other: Small volume free fluid surrounding the urinary bladder. No fluid collection or free air. Musculoskeletal: No acute or abnormal lytic or blastic osseous lesions. Postsurgical changes from spinal fixation spanning T11-S1. Multilevel degenerative changes of the partially imaged thoracic and lumbar spine. Asymmetrically expanded appearance of the left psoas muscle, new from 02/03/2017, with associated adjacent stranding. IMPRESSION: 1. Circumferential mural thickening of the urinary bladder with surrounding stranding and free fluid. Hyperattenuating, masslike lobulation along the posterior bladder wall measures 3.6 x 1.8 cm. While this finding may reflect layering hyperattenuating debris or blood products in the setting of acute cystitis, findings are suspicious for bladder malignancy. Recommend correlation with urinalysis and ultrasound examination of the bladder to confirm mass lesion. 2. Asymmetrically expanded left psoas muscle may reflect  intramuscular hematoma in the setting of recent fall. 3. Subcentimeter hypoattenuating focus along the peripheral lateral spleen, too small to characterize. 4. Right inguinal lymph node measures 13 mm, indeterminate. 5. Small hiatal hernia. 6. Colonic diverticulosis without acute diverticulitis. 7. Coronary artery calcifications. Aortic Atherosclerosis (ICD10-I70.0). Electronically Signed: By: LDarrin NipperM.D. On: 02/25/2022 09:27   CT L-SPINE NO CHARGE  Result Date: 02/25/2022 CLINICAL DATA:  Chronic back pain.  Presenting after fall out of bed EXAM: CT Lumbar spine with contrast TECHNIQUE: Multiplanar CT images of the lumbar spine were reconstructed from contemporary CT of the Abdomen and Pelvis. RADIATION DOSE REDUCTION: This exam was performed according to the departmental dose-optimization program which includes automated exposure control, adjustment of the mA and/or kV according to patient size and/or use of iterative reconstruction technique. CONTRAST:  No additional COMPARISON:  CT lumbar spine dated 02/03/2017 FINDINGS: CT LUMBAR SPINE FINDINGS Segmentation: 5 lumbar type vertebrae. Alignment: Straightened status post posterior spinal fixation spanning T11-S1. Hardware appears intact. Vertebrae: No acute fracture or focal pathologic process. Paraspinal and other soft tissues: Aortic atherosclerosis. Small hiatal hernia. Asymmetrically expanded left psoas muscle. Partially imaged bladder wall thickening and surrounding edema. Disc levels: Similar multilevel intervertebral disc space narrowing of the imaged thoracolumbar spine. IMPRESSION: 1. No acute fracture or traumatic listhesis of the  lumbar spine. 2. Asymmetrically expanded left psoas muscle, which may represent an intramuscular hematoma. 3. Partially imaged bladder wall thickening and surrounding edema, better evaluated on concurrent CT abdomen and pelvis. 4. Small hiatal hernia. 5. Aortic Atherosclerosis (ICD10-I70.0). Electronically Signed   By:  Darrin Nipper M.D.   On: 02/25/2022 09:33   CT Head Wo Contrast  Result Date: 02/25/2022 CLINICAL DATA:  Trauma EXAM: CT HEAD WITHOUT CONTRAST CT CERVICAL SPINE WITHOUT CONTRAST TECHNIQUE: Multidetector CT imaging of the head and cervical spine was performed following the standard protocol without intravenous contrast. Multiplanar CT image reconstructions of the cervical spine were also generated. RADIATION DOSE REDUCTION: This exam was performed according to the departmental dose-optimization program which includes automated exposure control, adjustment of the mA and/or kV according to patient size and/or use of iterative reconstruction technique. 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 There is sequela of mild chronic microvascular ischemic change. No evidence of hemorrhage, hydrocephalus, extra-axial collection or mass lesion/mass effect. Vascular: No hyperdense vessel or unexpected calcification. Skull: Normal. Negative for fracture or focal lesion. Sinuses/Orbits: No mastoid or middle ear effusion. Paranasal sinuses are clear. Bilateral lens replacement. Bilateral orbits are otherwise unremarkable. Other: None. CT CERVICAL SPINE FINDINGS Alignment: Grade 1 anterolisthesis of C3 on C4 and C7 on T1. This is new compared to 2005. Skull base and vertebrae: Postsurgical changes from C5-C7 ACDF with solid osseous fusion C5-C6 incomplete fusion at C6-C7. There is also fusion at C4-C5. Soft tissues and spinal canal: No prevertebral fluid or swelling. No visible canal hematoma. Disc levels:  No CT evidence of high-grade spinal canal stenosis Upper chest: Negative. Other: None IMPRESSION: 1. No acute intracranial abnormality. 2. 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 3. No acute cervical spine fracture. Electronically  Signed   By: Marin Roberts M.D.   On: 02/25/2022 09:26   CT Cervical Spine Wo Contrast  Result Date: 02/25/2022 CLINICAL DATA:  Trauma EXAM: CT HEAD WITHOUT CONTRAST CT CERVICAL SPINE WITHOUT CONTRAST TECHNIQUE: Multidetector CT imaging of the head and cervical spine was performed following the standard protocol without intravenous contrast. Multiplanar CT image reconstructions of the cervical spine were also generated. RADIATION DOSE REDUCTION: This exam was performed according to the departmental dose-optimization program which includes automated exposure control, adjustment of the mA and/or kV according to patient size and/or use of iterative reconstruction technique. 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 There is sequela of mild chronic microvascular ischemic change. No evidence of hemorrhage, hydrocephalus, extra-axial collection or mass lesion/mass effect. Vascular: No hyperdense vessel or unexpected calcification. Skull: Normal. Negative for fracture or focal lesion. Sinuses/Orbits: No mastoid or middle ear effusion. Paranasal sinuses are clear. Bilateral lens replacement. Bilateral orbits are otherwise unremarkable. Other: None. CT CERVICAL SPINE FINDINGS Alignment: Grade 1 anterolisthesis of C3 on C4 and C7 on T1. This is new compared to 2005. Skull base and vertebrae: Postsurgical changes from C5-C7 ACDF with solid osseous fusion C5-C6 incomplete fusion at C6-C7. There is also fusion at C4-C5. Soft tissues and spinal canal: No prevertebral fluid or swelling. No visible canal hematoma. Disc levels:  No CT evidence of high-grade spinal canal stenosis Upper  chest: Negative. Other: None IMPRESSION: 1. No acute intracranial abnormality. 2. 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 3. No acute cervical spine  fracture. Electronically Signed   By: Marin Roberts M.D.   On: 02/25/2022 09:26    Microbiology: Results for orders placed or performed during the hospital encounter of 02/25/22  Wet prep, genital     Status: Abnormal   Collection Time: 02/25/22  7:55 AM   Specimen: Vaginal  Result Value Ref Range Status   Yeast Wet Prep HPF POC NONE SEEN NONE SEEN Final   Trich, Wet Prep NONE SEEN NONE SEEN Final   Clue Cells Wet Prep HPF POC PRESENT (A) NONE SEEN Final   WBC, Wet Prep HPF POC >=10 (A) <10 Final   Sperm NONE SEEN  Final    Comment: Performed at [facility], [address] [place], [place] [address]  Urine Culture     Status: Abnormal   Collection Time: 02/25/22  9:07 AM   Specimen: Urine, Clean Catch  Result Value Ref Range Status   Specimen Description   Final    URINE, CLEAN CATCH Performed at [facility], [address] [place], [place] [address]    Special Requests   Final    NONE Performed at [facility], [address] [place], [place] [id number]    Culture >=100,000 COLONIES/mL Laurie MIRABILIS (A)  Final   Report Status 02/27/2022 FINAL  Final   Organism ID, Bacteria Laurie MIRABILIS (A)  Final      Susceptibility   Laurie mirabilis - MIC*    AMPICILLIN <=2 SENSITIVE Sensitive     CEFAZOLIN <=4 SENSITIVE Sensitive     CEFEPIME <=0.12 SENSITIVE Sensitive     CEFTRIAXONE <=0.25 SENSITIVE Sensitive     CIPROFLOXACIN <=0.25 SENSITIVE Sensitive     GENTAMICIN <=1 SENSITIVE Sensitive     IMIPENEM 1 SENSITIVE Sensitive     NITROFURANTOIN 128 RESISTANT Resistant     TRIMETH/SULFA <=20 SENSITIVE Sensitive     AMPICILLIN/SULBACTAM <=2 SENSITIVE Sensitive     PIP/TAZO <=4 SENSITIVE Sensitive     * >=100,000 COLONIES/mL Laurie MIRABILIS    Labs: CBC: Recent Labs  Lab 02/25/22 0737 02/26/22 0455 02/27/22 0433 02/28/22 0714 03/01/22 0432 03/02/22 0444  WBC 9.3 8.2 8.9 7.4 7.7 8.4  NEUTROABS 7.8*  --   --   --   --   --   HGB  11.3* 8.7* 9.1* 9.3* 9.5* 10.0*  HCT 35.7* 27.2* 29.0* 28.8* 29.0* 31.2*  MCV 91.3 89.2 90.3 88.9 87.3 89.7  PLT 314 267 248 226 289 229    Basic Metabolic Panel: Recent Labs  Lab 02/26/22 0455 02/27/22 0433 02/28/22 0714 03/01/22 0432 03/02/22 0444  NA 137 137 136 136 140  K 3.6 3.7 3.7 3.2* 3.8  CL 109 108 108 106 109  CO2 19* 21* 20* 22 21*  GLUCOSE 117* 129* 106* 107* 125*  BUN 33* 29* '18 17 19  '$ CREATININE 0.98 0.93 0.68 0.72 0.68  CALCIUM 8.2* 8.3* 8.2* 8.6* 9.0  MG  --  2.1 2.1  --  2.1    Liver Function Tests: Recent Labs  Lab 02/25/22 0737 02/26/22 0455  AST 67* 65*  ALT 65* 50*  ALKPHOS 119 80  BILITOT 1.0 0.8  PROT 7.4 6.1*  ALBUMIN 3.2* 2.6*    CBG: No results for input(s): "GLUCAP" in the last 168 hours.  Discharge time spent: greater than  30 minutes.  Signed: Annita Brod, MD [facility] 03/03/2022

## 2022-03-03 NOTE — Plan of Care (Signed)
  Problem: Health Behavior/Discharge Planning: Goal: Ability to manage health-related needs will improve Outcome: Progressing   Problem: Clinical Measurements: Goal: Ability to maintain clinical measurements within normal limits will improve Outcome: Progressing   Problem: Activity: Goal: Risk for activity intolerance will decrease Outcome: Progressing   Problem: Elimination: Goal: Will not experience complications related to bowel motility Outcome: Progressing   Problem: Pain Managment: Goal: General experience of comfort will improve Outcome: Progressing   Problem: Safety: Goal: Ability to remain free from injury will improve Outcome: Progressing   Problem: Skin Integrity: Goal: Risk for impaired skin integrity will decrease Outcome: Progressing

## 2022-03-03 NOTE — Care Management Important Message (Signed)
Important Message  Patient Details  Name: Laurie Hale MRN: [id number] Date of Birth: 04-03-1940   Medicare Important Message Given:  Yes     [name] 03/03/2022, 10:05 AM

## 2022-03-24 ENCOUNTER — Ambulatory Visit (INDEPENDENT_AMBULATORY_CARE_PROVIDER_SITE_OTHER): Payer: Medicare Other | Admitting: Physician Assistant

## 2022-03-24 ENCOUNTER — Encounter: Payer: Self-pay | Admitting: Physician Assistant

## 2022-03-24 VITALS — Ht 60.0 in | Wt 179.0 lb

## 2022-03-24 DIAGNOSIS — T839XXA Unspecified complication of genitourinary prosthetic device, implant and graft, initial encounter: Secondary | ICD-10-CM | POA: Diagnosis not present

## 2022-03-24 NOTE — Progress Notes (Signed)
Patient presented to clinic today for evaluation of "catheter coming apart."  She has a traumatic bladder dome perforation currently being managed conservatively with indwelling Foley catheter with plans for cystogram and possible Foley removal with me later this month.  She denies fevers.  Abdomen is soft nontender without rebound or guarding.  I removed her catheter leg strap and replaced it with a StatLock on her left thigh.  I also replaced her night bag.  The drainage tubing did appear to be coming off of the connector valve.  Encouraged the patient to keep plans for cystoscopy with possible Foley removal afterward as previously discussed.  I spent 12 minutes on the day of the encounter to include pre-visit record review, face-to-face time with the patient, and post-visit ordering of tests.

## 2022-03-25 ENCOUNTER — Telehealth: Payer: Self-pay | Admitting: *Deleted

## 2022-03-25 NOTE — Telephone Encounter (Signed)
Spoke with Laurie Hale and advised results

## 2022-03-25 NOTE — Telephone Encounter (Signed)
Catheter was left in place deliberately. We are managing her for a bladder perforation. Catheter should NOT be changed until her follow up with me on 2/21.

## 2022-03-25 NOTE — Telephone Encounter (Signed)
Marlowe Kays with [facility] left message on triage stating that pt came in on 03/24/22 and did not have her catheter changed. Marlowe Kays states pt had catheter placed on 02/25/22 and will it be ok to leave cath in until her next appt on 04/06/2022? Or they will take an order to have this changed. Please advise.

## 2022-03-29 ENCOUNTER — Ambulatory Visit (INDEPENDENT_AMBULATORY_CARE_PROVIDER_SITE_OTHER): Payer: Medicare Other | Admitting: Physician Assistant

## 2022-03-29 ENCOUNTER — Encounter: Payer: Self-pay | Admitting: Physician Assistant

## 2022-03-29 VITALS — BP 150/81 | HR 93 | Temp 98.1°F | Ht 61.0 in

## 2022-03-29 DIAGNOSIS — R310 Gross hematuria: Secondary | ICD-10-CM

## 2022-03-29 DIAGNOSIS — B3731 Acute candidiasis of vulva and vagina: Secondary | ICD-10-CM | POA: Diagnosis not present

## 2022-03-29 DIAGNOSIS — R339 Retention of urine, unspecified: Secondary | ICD-10-CM | POA: Diagnosis not present

## 2022-03-29 MED ORDER — SULFAMETHOXAZOLE-TRIMETHOPRIM 800-160 MG PO TABS
1.0000 | ORAL_TABLET | Freq: Once | ORAL | Status: AC
  Administered 2022-03-29: 1 via ORAL

## 2022-03-29 MED ORDER — FLUCONAZOLE 150 MG PO TABS: 150.0000 mg | ORAL_TABLET | Freq: Once | ORAL | 0 refills | Status: AC

## 2022-03-29 MED ORDER — SULFAMETHOXAZOLE-TRIMETHOPRIM 800-160 MG PO TABS: 1.0000 | ORAL_TABLET | Freq: Once | ORAL | Status: DC

## 2022-03-29 MED ORDER — SULFAMETHOXAZOLE-TRIMETHOPRIM 800-160 MG PO TABS: 1.0000 | ORAL_TABLET | Freq: Two times a day (BID) | ORAL | 0 refills | Status: AC

## 2022-03-29 NOTE — Progress Notes (Signed)
03/29/2022 1:31 PM   [name] 14-Jan-1941 [id number]  CC: Chief Complaint  Patient presents with   Follow-up   HPI: Laurie Hale is a 82 y.o. female with traumatic bladder dome perforation and gross hematuria managed conservatively with Foley catheter awaiting follow-up cystogram later this month who presents today for evaluation of worsening gross hematuria.  She is accompanied today by her daughter, who contributes to HPI.  Today her daughter reports that she had some milky and red/pink urinary output yesterday and this morning.  On arrival today, urine is clear, yellow.  Patient reports some malaise and abdominal/low back discomfort today.  She denies fevers and has not taken any medication to manage this.  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, DCIS   Cataract    immature bilateral   Chronic back pain    Constipation    Coronary artery disease    Diverticulosis    GERD (gastroesophageal reflux disease)    takes a med daily-to bring with med name at surgery   History of migraine    hasn't had one in yrs-per pt Zoloft stopped them   Hyperlipidemia    takes Crestor and Niacin daily   Hypertension    takes Amlodipine  and Lisinopril daily   Hypokalemia    takes K Dur tid   Hypothyroidism    takes Synthroid daily   Insomnia    takes restoril prn   Joint pain    Joint swelling    Peripheral edema    takes HCTZ daily   Personal history of radiation therapy 2014   F/U lumpectomy    Presence of pessary     Surgical History: Past Surgical History:  Procedure Laterality Date   ABDOMINAL HYSTERECTOMY     BACK SURGERY  2009   BREAST BIOPSY Left 10/22/2012   Stereo - DCIS   BREAST LUMPECTOMY Left 2014   DCIS. F/U radiation    CARDIAC CATHETERIZATION  1999   CATARACT EXTRACTION W/PHACO Right 10/05/2015   Procedure: CATARACT EXTRACTION PHACO AND INTRAOCULAR LENS PLACEMENT (IOC);  Surgeon: Ronnell Freshwater, MD;  Location: [facility];  Service: Ophthalmology;  Laterality: Right;  RIGHT   CATARACT EXTRACTION W/PHACO Left 11/16/2015   Procedure: CATARACT EXTRACTION PHACO AND INTRAOCULAR LENS PLACEMENT (IOC);  Surgeon: Ronnell Freshwater, MD;  Location: [facility];  Service: Ophthalmology;  Laterality: Left;  LEFT   COLONOSCOPY     CORONARY ANGIOPLASTY     1 stent   partial coloectomy  1995   right knee arthroscopy  2013   TONSILLECTOMY     TOTAL KNEE ARTHROPLASTY  01/02/2012   RIGHT KNEE   TOTAL KNEE ARTHROPLASTY  01/02/2012   Procedure: TOTAL KNEE ARTHROPLASTY;  Surgeon: Vickey Huger, MD;  Location: [facility];  Service: Orthopedics;  Laterality: Right;   TOTAL KNEE ARTHROPLASTY Left 10/09/2017   Procedure: LEFT TOTAL KNEE ARTHROPLASTY;  Surgeon: Vickey Huger, MD;  Location: [facility];  Service: Orthopedics;  Laterality: Left;  with block    Home Medications:  Allergies as of 03/29/2022       Reactions   Ciprofloxacin    Penicillins Hives            Medication List        Accurate as of March 29, 2022  1:31 PM. If you have any questions, ask your nurse or doctor.  amLODipine 10 MG tablet Commonly known as: NORVASC Take 1 tablet (10 mg total) by mouth daily.   ascorbic acid 500 MG tablet Commonly known as: VITAMIN C Take 1 tablet (500 mg total) by mouth 2 (two) times daily.   feeding supplement Liqd Take 237 mLs by mouth 2 (two) times daily between meals.   iron polysaccharides 150 MG capsule Commonly known as: NIFEREX Take 1 capsule (150 mg total) by mouth daily.   levothyroxine 88 MCG tablet Commonly known as: SYNTHROID Take 88 mcg by mouth at bedtime.   lisinopril 40 MG tablet Commonly known as: ZESTRIL Take 1 tablet (40 mg total) by mouth daily.   meloxicam 15 MG tablet Commonly known as: MOBIC Take 1 tablet by mouth daily.   methocarbamol 500 MG tablet Commonly known as: ROBAXIN Take 1 tablet (500 mg total) by mouth  every 8 (eight) hours as needed for muscle spasms.   multivitamin with minerals Tabs tablet Take 1 tablet by mouth daily.   oxyCODONE 5 MG immediate release tablet Commonly known as: Oxy IR/ROXICODONE Take 1 tablet (5 mg total) by mouth every 4 (four) hours as needed for moderate pain.   QUEtiapine 25 MG tablet Commonly known as: SEROQUEL Take 1 tablet (25 mg total) by mouth at bedtime.   rOPINIRole 0.25 MG tablet Commonly known as: REQUIP Take 1 tablet (0.25 mg total) by mouth at bedtime for 7 days, THEN 2 tablets (0.5 mg total) at bedtime. Start taking on: March 02, 2022   rosuvastatin 20 MG tablet Commonly known as: CRESTOR Take 20 mg by mouth every Monday, Wednesday, and Friday.   senna-docusate 8.6-50 MG tablet Commonly known as: Senokot-S Take 2 tablets by mouth 2 (two) times daily.   sertraline 100 MG tablet Commonly known as: ZOLOFT Take 1.5 tablets (150 mg total) by mouth daily. Take one and a half (1.5) tablets by mouth once daily   sodium bicarbonate 650 MG tablet Take 1 tablet (650 mg total) by mouth daily.   sulfamethoxazole-trimethoprim 800-160 MG tablet Commonly known as: BACTRIM DS Take 1 tablet by mouth 2 (two) times daily for 13 doses. Started by: Debroah Loop, PA-C   zinc sulfate 220 (50 Zn) MG capsule Take 1 capsule (220 mg total) by mouth daily.        Allergies:  Allergies  Allergen Reactions   Ciprofloxacin    Penicillins Hives          Family History: Family History  Problem Relation Age of Onset   Hypertension Mother    Sudden death Sister        x2   Breast cancer Sister    Heart disease Brother     Social History:   reports that she has never smoked. She has never used smokeless tobacco. She reports that she does not drink alcohol and does not use drugs.  Physical Exam: BP (!) 150/81   Pulse 93   Temp 98.1 F (36.7 C)   Ht 5' 1"$  (1.549 m)   BMI 33.82 kg/m   Constitutional:  Alert and oriented, no acute  distress, nontoxic appearing HEENT: , AT Cardiovascular: No clubbing, cyanosis, or edema Respiratory: Normal respiratory effort, no increased work of breathing GI: Abdomen is soft, mildly tender with deep palpation, no rigidity, rebound or guarding GU: Beefy red rash of the external genitalia with clearly demarcated borders. Skin: No rashes, bruises or suspicious lesions Neurologic: Grossly intact, no focal deficits, moving all 4 extremities Psychiatric: Normal mood and affect  Laboratory  Data: Results for orders placed or performed during the hospital encounter of 02/25/22  Wet prep, genital   Specimen: Vaginal  Result Value Ref Range   Yeast Wet Prep HPF POC NONE SEEN NONE SEEN   Trich, Wet Prep NONE SEEN NONE SEEN   Clue Cells Wet Prep HPF POC PRESENT (A) NONE SEEN   WBC, Wet Prep HPF POC >=10 (A) <10   Sperm NONE SEEN   Urine Culture   Specimen: Urine, Clean Catch  Result Value Ref Range   Specimen Description      URINE, CLEAN CATCH Performed at [facility], [address] [place], [place] [address]    Special Requests      NONE Performed at [facility], [address] [place], [place] [address]    Culture >=100,000 COLONIES/mL PROTEUS MIRABILIS (A)    Report Status 02/27/2022 FINAL    Organism ID, Bacteria PROTEUS MIRABILIS (A)       Susceptibility   Proteus mirabilis - MIC*    AMPICILLIN <=2 SENSITIVE Sensitive     CEFAZOLIN <=4 SENSITIVE Sensitive     CEFEPIME <=0.12 SENSITIVE Sensitive     CEFTRIAXONE <=0.25 SENSITIVE Sensitive     CIPROFLOXACIN <=0.25 SENSITIVE Sensitive     GENTAMICIN <=1 SENSITIVE Sensitive     IMIPENEM 1 SENSITIVE Sensitive     NITROFURANTOIN 128 RESISTANT Resistant     TRIMETH/SULFA <=20 SENSITIVE Sensitive     AMPICILLIN/SULBACTAM <=2 SENSITIVE Sensitive     PIP/TAZO <=4 SENSITIVE Sensitive     * >=100,000 COLONIES/mL PROTEUS MIRABILIS  CBC with Differential  Result Value Ref Range   WBC 9.3 4.0 - 10.5 K/uL    RBC 3.91 3.87 - 5.11 MIL/uL   Hemoglobin 11.3 (L) 12.0 - 15.0 g/dL   HCT 35.7 (L) 36.0 - 46.0 %   MCV 91.3 80.0 - 100.0 fL   MCH 28.9 26.0 - 34.0 pg   MCHC 31.7 30.0 - 36.0 g/dL   RDW 13.7 11.5 - 15.5 %   Platelets 314 150 - 400 K/uL   nRBC 0.0 0.0 - 0.2 %   Neutrophils Relative % 85 %   Neutro Abs 7.8 (H) 1.7 - 7.7 K/uL   Lymphocytes Relative 6 %   Lymphs Abs 0.6 (L) 0.7 - 4.0 K/uL   Monocytes Relative 9 %   Monocytes Absolute 0.8 0.1 - 1.0 K/uL   Eosinophils Relative 0 %   Eosinophils Absolute 0.0 0.0 - 0.5 K/uL   Basophils Relative 0 %   Basophils Absolute 0.0 0.0 - 0.1 K/uL   Immature Granulocytes 0 %   Abs Immature Granulocytes 0.03 0.00 - 0.07 K/uL  Comprehensive metabolic panel  Result Value Ref Range   Sodium 139 135 - 145 mmol/L   Potassium 3.9 3.5 - 5.1 mmol/L   Chloride 108 98 - 111 mmol/L   CO2 21 (L) 22 - 32 mmol/L   Glucose, Bld 145 (H) 70 - 99 mg/dL   BUN 42 (H) 8 - 23 mg/dL   Creatinine, Ser 1.39 (H) 0.44 - 1.00 mg/dL   Calcium 9.2 8.9 - 10.3 mg/dL   Total Protein 7.4 6.5 - 8.1 g/dL   Albumin 3.2 (L) 3.5 - 5.0 g/dL   AST 67 (H) 15 - 41 U/L   ALT 65 (H) 0 - 44 U/L   Alkaline Phosphatase 119 38 - 126 U/L   Total Bilirubin 1.0 0.3 - 1.2 mg/dL   GFR, Estimated 38 (L) >60 mL/min   Anion gap  10 5 - 15  Lipase, blood  Result Value Ref Range   Lipase 159 (H) 11 - 51 U/L  Urinalysis, Routine w reflex microscopic Urine, Unspecified Source  Result Value Ref Range   Color, Urine RED (A) YELLOW   APPearance TURBID (A) CLEAR   Specific Gravity, Urine 1.030 1.005 - 1.030   pH  5.0 - 8.0    TEST NOT REPORTED DUE TO COLOR INTERFERENCE OF URINE PIGMENT   Glucose, UA (A) NEGATIVE mg/dL    TEST NOT REPORTED DUE TO COLOR INTERFERENCE OF URINE PIGMENT   Hgb urine dipstick (A) NEGATIVE    TEST NOT REPORTED DUE TO COLOR INTERFERENCE OF URINE PIGMENT   Bilirubin Urine (A) NEGATIVE    TEST NOT REPORTED DUE TO COLOR INTERFERENCE OF URINE PIGMENT   Ketones, ur (A) NEGATIVE  mg/dL    TEST NOT REPORTED DUE TO COLOR INTERFERENCE OF URINE PIGMENT   Protein, ur (A) NEGATIVE mg/dL    TEST NOT REPORTED DUE TO COLOR INTERFERENCE OF URINE PIGMENT   Nitrite (A) NEGATIVE    TEST NOT REPORTED DUE TO COLOR INTERFERENCE OF URINE PIGMENT   Leukocytes,Ua (A) NEGATIVE    TEST NOT REPORTED DUE TO COLOR INTERFERENCE OF URINE PIGMENT   RBC / HPF >50 (H) 0 - 5 RBC/hpf   WBC, UA >50 (H) 0 - 5 WBC/hpf   Bacteria, UA NONE SEEN NONE SEEN   Squamous Epithelial / HPF NONE SEEN 0 - 5 /HPF  Protime-INR  Result Value Ref Range   Prothrombin Time 15.9 (H) 11.4 - 15.2 seconds   INR 1.3 (H) 0.8 - 1.2  APTT  Result Value Ref Range   aPTT 30 24 - 36 seconds  Hepatitis panel, acute  Result Value Ref Range   Hepatitis B Surface Ag See Scanned report in [place] Link (A) NON REACTIVE   HCV Ab See Scanned report in [place] Link (A) NON REACTIVE   Hep A IgM Negative (A) Negative   Hep B C IgM See Scanned report in [place] Link (A) NON REACTIVE  Triglycerides  Result Value Ref Range   Triglycerides 74 <150 mg/dL  CBC  Result Value Ref Range   WBC 8.2 4.0 - 10.5 K/uL   RBC 3.05 (L) 3.87 - 5.11 MIL/uL   Hemoglobin 8.7 (L) 12.0 - 15.0 g/dL   HCT 27.2 (L) 36.0 - 46.0 %   MCV 89.2 80.0 - 100.0 fL   MCH 28.5 26.0 - 34.0 pg   MCHC 32.0 30.0 - 36.0 g/dL   RDW 13.8 11.5 - 15.5 %   Platelets 267 150 - 400 K/uL   nRBC 0.0 0.0 - 0.2 %  Lipase, blood  Result Value Ref Range   Lipase 114 (H) 11 - 51 U/L  Comprehensive metabolic panel  Result Value Ref Range   Sodium 137 135 - 145 mmol/L   Potassium 3.6 3.5 - 5.1 mmol/L   Chloride 109 98 - 111 mmol/L   CO2 19 (L) 22 - 32 mmol/L   Glucose, Bld 117 (H) 70 - 99 mg/dL   BUN 33 (H) 8 - 23 mg/dL   Creatinine, Ser 0.98 0.44 - 1.00 mg/dL   Calcium 8.2 (L) 8.9 - 10.3 mg/dL   Total Protein 6.1 (L) 6.5 - 8.1 g/dL   Albumin 2.6 (L) 3.5 - 5.0 g/dL   AST 65 (H) 15 - 41 U/L   ALT 50 (H) 0 - 44 U/L   Alkaline Phosphatase 80 38 -  126 U/L    Total Bilirubin 0.8 0.3 - 1.2 mg/dL   GFR, Estimated 57 (L) >60 mL/min   Anion gap 9 5 - 15  Iron and TIBC  Result Value Ref Range   Iron 17 (L) 28 - 170 ug/dL   TIBC 235 (L) 250 - 450 ug/dL   Saturation Ratios 7 (L) 10.4 - 31.8 %   UIBC 218 ug/dL  Ferritin  Result Value Ref Range   Ferritin 96 11 - 307 ng/mL  Vitamin B12  Result Value Ref Range   Vitamin B-12 533 180 - 914 pg/mL  CBC  Result Value Ref Range   WBC 8.9 4.0 - 10.5 K/uL   RBC 3.21 (L) 3.87 - 5.11 MIL/uL   Hemoglobin 9.1 (L) 12.0 - 15.0 g/dL   HCT 29.0 (L) 36.0 - 46.0 %   MCV 90.3 80.0 - 100.0 fL   MCH 28.3 26.0 - 34.0 pg   MCHC 31.4 30.0 - 36.0 g/dL   RDW 13.9 11.5 - 15.5 %   Platelets 248 150 - 400 K/uL   nRBC 0.0 0.0 - 0.2 %  Basic metabolic panel  Result Value Ref Range   Sodium 137 135 - 145 mmol/L   Potassium 3.7 3.5 - 5.1 mmol/L   Chloride 108 98 - 111 mmol/L   CO2 21 (L) 22 - 32 mmol/L   Glucose, Bld 129 (H) 70 - 99 mg/dL   BUN 29 (H) 8 - 23 mg/dL   Creatinine, Ser 0.93 0.44 - 1.00 mg/dL   Calcium 8.3 (L) 8.9 - 10.3 mg/dL   GFR, Estimated >60 >60 mL/min   Anion gap 8 5 - 15  Magnesium  Result Value Ref Range   Magnesium 2.1 1.7 - 2.4 mg/dL  Hepatitis panel, acute  Result Value Ref Range   Hepatitis B Surface Ag NON REACTIVE NON REACTIVE   HCV Ab NON REACTIVE NON REACTIVE   Hep A IgM NON REACTIVE NON REACTIVE   Hep B C IgM NON REACTIVE NON REACTIVE  Interpretation:  Result Value Ref Range   HCV Interp 1: Comment   Basic metabolic panel  Result Value Ref Range   Sodium 136 135 - 145 mmol/L   Potassium 3.7 3.5 - 5.1 mmol/L   Chloride 108 98 - 111 mmol/L   CO2 20 (L) 22 - 32 mmol/L   Glucose, Bld 106 (H) 70 - 99 mg/dL   BUN 18 8 - 23 mg/dL   Creatinine, Ser 0.68 0.44 - 1.00 mg/dL   Calcium 8.2 (L) 8.9 - 10.3 mg/dL   GFR, Estimated >60 >60 mL/min   Anion gap 8 5 - 15  CBC  Result Value Ref Range   WBC 7.4 4.0 - 10.5 K/uL   RBC 3.24 (L) 3.87 - 5.11 MIL/uL   Hemoglobin 9.3 (L) 12.0 -  15.0 g/dL   HCT 28.8 (L) 36.0 - 46.0 %   MCV 88.9 80.0 - 100.0 fL   MCH 28.7 26.0 - 34.0 pg   MCHC 32.3 30.0 - 36.0 g/dL   RDW 14.1 11.5 - 15.5 %   Platelets 226 150 - 400 K/uL   nRBC 0.0 0.0 - 0.2 %  Magnesium  Result Value Ref Range   Magnesium 2.1 1.7 - 2.4 mg/dL  Basic metabolic panel  Result Value Ref Range   Sodium 136 135 - 145 mmol/L   Potassium 3.2 (L) 3.5 - 5.1 mmol/L   Chloride 106 98 - 111 mmol/L   CO2 22 22 -  32 mmol/L   Glucose, Bld 107 (H) 70 - 99 mg/dL   BUN 17 8 - 23 mg/dL   Creatinine, Ser 0.72 0.44 - 1.00 mg/dL   Calcium 8.6 (L) 8.9 - 10.3 mg/dL   GFR, Estimated >60 >60 mL/min   Anion gap 8 5 - 15  CBC  Result Value Ref Range   WBC 7.7 4.0 - 10.5 K/uL   RBC 3.32 (L) 3.87 - 5.11 MIL/uL   Hemoglobin 9.5 (L) 12.0 - 15.0 g/dL   HCT 29.0 (L) 36.0 - 46.0 %   MCV 87.3 80.0 - 100.0 fL   MCH 28.6 26.0 - 34.0 pg   MCHC 32.8 30.0 - 36.0 g/dL   RDW 13.9 11.5 - 15.5 %   Platelets 289 150 - 400 K/uL   nRBC 0.0 0.0 - 0.2 %  Basic metabolic panel  Result Value Ref Range   Sodium 140 135 - 145 mmol/L   Potassium 3.8 3.5 - 5.1 mmol/L   Chloride 109 98 - 111 mmol/L   CO2 21 (L) 22 - 32 mmol/L   Glucose, Bld 125 (H) 70 - 99 mg/dL   BUN 19 8 - 23 mg/dL   Creatinine, Ser 0.68 0.44 - 1.00 mg/dL   Calcium 9.0 8.9 - 10.3 mg/dL   GFR, Estimated >60 >60 mL/min   Anion gap 10 5 - 15  Magnesium  Result Value Ref Range   Magnesium 2.1 1.7 - 2.4 mg/dL  CBC  Result Value Ref Range   WBC 8.4 4.0 - 10.5 K/uL   RBC 3.48 (L) 3.87 - 5.11 MIL/uL   Hemoglobin 10.0 (L) 12.0 - 15.0 g/dL   HCT 31.2 (L) 36.0 - 46.0 %   MCV 89.7 80.0 - 100.0 fL   MCH 28.7 26.0 - 34.0 pg   MCHC 32.1 30.0 - 36.0 g/dL   RDW 14.1 11.5 - 15.5 %   Platelets 350 150 - 400 K/uL   nRBC 0.0 0.0 - 0.2 %   Cath Change/ Replacement  Patient is present today for a catheter change due to urinary retention.  76m of water was removed from the balloon, a 16FR foley cath was removed without difficulty.  Patient  was cleaned and prepped in a sterile fashion with betadine. A 16 FR Silastic foley cath was replaced into the bladder, no complications were noted. Urine return was noted 373mand urine was pink in color. The balloon was filled with 1052mf sterile water. A night bag was attached for drainage.  Patient tolerated well.    Performed by: SamDebroah LoopA-C   Assessment & Plan:   1. Hematuria, gross VSS and afebrile today.  Gross hematuria has resolved as of her arrival.  No peritoneal signs on physical exam.  With reports of cloudy urine and some abdominal discomfort as well as malaise, will treat for possible UTI out of an abundance of caution.  I exchanged her Foley catheter in clinic today, see procedure note above.  1 dose of Bactrim was administered prior.  Will have her continue for a total of 14 doses and keep plans for cystogram later this month.  We reviewed return precautions including fever, acutely worsening pain, or acutely worsening gross hematuria. - CULTURE, URINE COMPREHENSIVE - sulfamethoxazole-trimethoprim (BACTRIM DS) 800-160 MG per tablet 1 tablet - sulfamethoxazole-trimethoprim (BACTRIM DS) 800-160 MG tablet; Take 1 tablet by mouth 2 (two) times daily for 13 doses.  Dispense: 13 tablet; Refill: 0  2. Candidiasis of genitalia in female Vulvovaginal candidiasis  noted on physical exam today, likely secondary to antibiotic use.  Will treat with Diflucan 150 mg x 1 dose. - fluconazole (DIFLUCAN) 150 MG tablet; Take 1 tablet (150 mg total) by mouth once for 1 dose.  Dispense: 1 tablet; Refill: 0   Return in 8 days (on 04/06/2022) for Cystogram as scheduled.  Debroah Loop, PA-C  [facility] [address], [place], [place] [address] [phone]

## 2022-03-31 ENCOUNTER — Ambulatory Visit: Payer: Medicare Other | Admitting: Urology

## 2022-03-31 ENCOUNTER — Ambulatory Visit: Payer: 59 | Admitting: Physician Assistant

## 2022-04-01 ENCOUNTER — Ambulatory Visit: Admission: RE | Admit: 2022-04-01 | Payer: Medicare Other | Source: Ambulatory Visit

## 2022-04-04 ENCOUNTER — Ambulatory Visit: Payer: 59 | Admitting: Urology

## 2022-04-04 ENCOUNTER — Ambulatory Visit: Payer: Medicare Other | Admitting: Urology

## 2022-04-04 LAB — CULTURE, URINE COMPREHENSIVE

## 2022-04-06 ENCOUNTER — Ambulatory Visit (INDEPENDENT_AMBULATORY_CARE_PROVIDER_SITE_OTHER): Payer: Medicare Other | Admitting: Physician Assistant

## 2022-04-06 ENCOUNTER — Ambulatory Visit
Admission: RE | Admit: 2022-04-06 | Discharge: 2022-04-06 | Disposition: A | Discharge status: Home / Self Care | Payer: Medicare Other | Source: Ambulatory Visit | Attending: Physician Assistant | Admitting: Physician Assistant

## 2022-04-06 VITALS — BP 155/77 | HR 80 | Ht 61.0 in | Wt 179.0 lb

## 2022-04-06 DIAGNOSIS — X58XXXD Exposure to other specified factors, subsequent encounter: Secondary | ICD-10-CM | POA: Insufficient documentation

## 2022-04-06 DIAGNOSIS — N9089 Other specified noninflammatory disorders of vulva and perineum: Secondary | ICD-10-CM

## 2022-04-06 DIAGNOSIS — S3729XD Other injury of bladder, subsequent encounter: Secondary | ICD-10-CM | POA: Diagnosis not present

## 2022-04-06 MED ORDER — IOHEXOL 300 MG/ML  SOLN
50.0000 mL | Freq: Once | INTRAMUSCULAR | Status: AC | PRN
  Administered 2022-04-06: 50 mL

## 2022-04-06 MED ORDER — TRIMETHOPRIM 100 MG PO TABS: 100.0000 mg | ORAL_TABLET | Freq: Every day | ORAL | 0 refills | Status: AC

## 2022-04-06 MED ORDER — IOHEXOL 300 MG/ML  SOLN
100.0000 mL | Freq: Once | INTRAMUSCULAR | Status: AC | PRN
  Administered 2022-04-06: 100 mL via INTRAVENOUS

## 2022-04-06 NOTE — Patient Instructions (Addendum)
Try using a donut pillow to reduce pressure on the catheter when you sit down. You are having some irritation on your labia due to the catheter. You may try over-the-counter diaper creams to help provide a moisture barrier and soothe the skin. You can find these in the baby aisle at the pharmacy; I recommend [employer]'s Butt Paste or Triple Paste brand, 10% zinc content.

## 2022-04-06 NOTE — Progress Notes (Signed)
04/06/2022 10:51 AM   Laurie Hale 1940-06-08 [id number]  CC: Chief Complaint  Patient presents with   Other    Bladder rupture follow-up   HPI: Laurie Hale is a 82 y.o. female with recent history of multiple falls and traumatic bladder dome perforation being managed conservatively with indwelling Foley catheter who presents today for follow-up cystogram results and possible Foley catheter removal.  He is accompanied today by her daughter, who contributes to HPI.  Today she reports labial discomfort around the Foley catheter for the past several days.  She completed Bactrim 2 days ago for possible UTI.  Foley catheter in place draining clear, yellow urine.  CT cystogram was performed this morning.  Official read is pending, however I spoke with radiology via telephone and we discussed notable findings including significant interval improvement in bladder dome leak, however there is still a subtle defect at the bladder perforation site consistent with incompletely healed bladder perforation.  There is also contrast visualized within the vagina, however no clear fistulous tract is identified.  Patient is unsure if contrast was injected into her bladder today.  She has not noticed urinary leakage from the vagina.  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, DCIS   Cataract    immature bilateral   Chronic back pain    Constipation    Coronary artery disease    Diverticulosis    GERD (gastroesophageal reflux disease)    takes a med daily-to bring with med name at surgery   History of migraine    hasn't had one in yrs-per pt Zoloft stopped them   Hyperlipidemia    takes Crestor and Niacin daily   Hypertension    takes Amlodipine  and Lisinopril daily   Hypokalemia    takes K Dur tid   Hypothyroidism    takes Synthroid daily   Insomnia    takes restoril prn   Joint pain    Joint swelling    Peripheral edema    takes  HCTZ daily   Personal history of radiation therapy 2014   F/U lumpectomy    Presence of pessary     Surgical History: Past Surgical History:  Procedure Laterality Date   ABDOMINAL HYSTERECTOMY     BACK SURGERY  2009   BREAST BIOPSY Left 10/22/2012   Stereo - DCIS   BREAST LUMPECTOMY Left 2014   DCIS. F/U radiation    CARDIAC CATHETERIZATION  1999   CATARACT EXTRACTION W/PHACO Right 10/05/2015   Procedure: CATARACT EXTRACTION PHACO AND INTRAOCULAR LENS PLACEMENT (IOC);  Surgeon: Ronnell Freshwater, MD;  Location: [facility];  Service: Ophthalmology;  Laterality: Right;  RIGHT   CATARACT EXTRACTION W/PHACO Left 11/16/2015   Procedure: CATARACT EXTRACTION PHACO AND INTRAOCULAR LENS PLACEMENT (IOC);  Surgeon: Ronnell Freshwater, MD;  Location: [facility];  Service: Ophthalmology;  Laterality: Left;  LEFT   COLONOSCOPY     CORONARY ANGIOPLASTY     1 stent   partial coloectomy  1995   right knee arthroscopy  2013   TONSILLECTOMY     TOTAL KNEE ARTHROPLASTY  01/02/2012   RIGHT KNEE   TOTAL KNEE ARTHROPLASTY  01/02/2012   Procedure: TOTAL KNEE ARTHROPLASTY;  Surgeon: Vickey Huger, MD;  Location: [facility];  Service: Orthopedics;  Laterality: Right;   TOTAL KNEE ARTHROPLASTY Left 10/09/2017   Procedure: LEFT TOTAL KNEE ARTHROPLASTY;  Surgeon: Vickey Huger, MD;  Location: [facility];  Service: Orthopedics;  Laterality: Left;  with block    Home Medications:  Allergies as of 04/06/2022       Reactions   Ciprofloxacin    Penicillins Hives            Medication List        Accurate as of April 06, 2022 10:51 AM. If you have any questions, ask your nurse or doctor.          amLODipine 10 MG tablet Commonly known as: NORVASC Take 1 tablet (10 mg total) by mouth daily.   ascorbic acid 500 MG tablet Commonly known as: VITAMIN C Take 1 tablet (500 mg total) by mouth 2 (two) times daily.   feeding supplement Liqd Take 237 mLs by mouth 2 (two) times  daily between meals.   iron polysaccharides 150 MG capsule Commonly known as: NIFEREX Take 1 capsule (150 mg total) by mouth daily.   levothyroxine 88 MCG tablet Commonly known as: SYNTHROID Take 88 mcg by mouth at bedtime.   lisinopril 40 MG tablet Commonly known as: ZESTRIL Take 1 tablet (40 mg total) by mouth daily.   meloxicam 15 MG tablet Commonly known as: MOBIC Take 1 tablet by mouth daily.   methocarbamol 500 MG tablet Commonly known as: ROBAXIN Take 1 tablet (500 mg total) by mouth every 8 (eight) hours as needed for muscle spasms.   multivitamin with minerals Tabs tablet Take 1 tablet by mouth daily.   oxyCODONE 5 MG immediate release tablet Commonly known as: Oxy IR/ROXICODONE Take 1 tablet (5 mg total) by mouth every 4 (four) hours as needed for moderate pain.   QUEtiapine 25 MG tablet Commonly known as: SEROQUEL Take 1 tablet (25 mg total) by mouth at bedtime.   rOPINIRole 0.25 MG tablet Commonly known as: REQUIP Take 1 tablet (0.25 mg total) by mouth at bedtime for 7 days, THEN 2 tablets (0.5 mg total) at bedtime. Start taking on: March 02, 2022   rosuvastatin 20 MG tablet Commonly known as: CRESTOR Take 20 mg by mouth every Monday, Wednesday, and Friday.   senna-docusate 8.6-50 MG tablet Commonly known as: Senokot-S Take 2 tablets by mouth 2 (two) times daily.   sertraline 100 MG tablet Commonly known as: ZOLOFT Take 1.5 tablets (150 mg total) by mouth daily. Take one and a half (1.5) tablets by mouth once daily   sodium bicarbonate 650 MG tablet Take 1 tablet (650 mg total) by mouth daily.   trimethoprim 100 MG tablet Commonly known as: TRIMPEX Take 1 tablet (100 mg total) by mouth daily for 14 days.   zinc sulfate 220 (50 Zn) MG capsule Take 1 capsule (220 mg total) by mouth daily.        Allergies:  Allergies  Allergen Reactions   Ciprofloxacin    Penicillins Hives          Family History: Family History  Problem Relation  Age of Onset   Hypertension Mother    Sudden death Sister        x2   Breast cancer Sister    Heart disease Brother     Social History:   reports that she has never smoked. She has never used smokeless tobacco. She reports that she does not drink alcohol and does not use drugs.  Physical Exam: BP (!) 155/77   Pulse 80   Ht '5\' 1"'$  (1.549 m)   Wt 179 lb (81.2 kg)   BMI 33.82 kg/m  Constitutional:  Alert, no acute distress, nontoxic appearing HEENT: [place], AT Cardiovascular: No clubbing, cyanosis, or edema Respiratory: Normal respiratory effort, no increased work of breathing GU: Foley catheter in place, however it is untethered from her StatLock and wound around both legs.  Some irritation noted on the labia minora due to adjacent Foley catheter. Skin: No rashes, bruises or suspicious lesions Neurologic: Grossly intact, moving all 4 extremities Psychiatric: Normal mood and affect  Pertinent Imaging: CT cystogram, 04/06/2022: CLINICAL DATA:  Traumatic bladder perforation in the dome from a fall 02/25/2022, managed conservatively with Foley catheter drainage, now presents for follow-up CT cystogram to assess healing or continued perforation.   EXAM: CT CYSTOGRAM (CT PELVIS WITH CONTRAST)   TECHNIQUE: Multidetector CT imaging through the pelvis was performed after dilute contrast had been introduced into the bladder for the purposes of performing CT cystography.   RADIATION DOSE REDUCTION: This exam was performed according to the departmental dose-optimization program which includes automated exposure control, adjustment of the mA and/or kV according to patient size and/or use of iterative reconstruction technique.   CONTRAST:  137m OMNIPAQUE IOHEXOL 300 MG/ML SOLN, 52mOMNIPAQUE IOHEXOL 300 MG/ML SOLN   COMPARISON:  CT cystogram 02/25/2022.   FINDINGS: Urinary Tract: The prior study demonstrated a 1.3 cm defect in the wall of the bladder in the anterior dome eccentric  to the left. The bladder wall is thin and outwardly protruding in this area, but contrast extravasation is no longer seen from this.   Remainder of the bladder wall is mildly trabeculated, without focal mass. The bladder is catheterized, overall mildly contrast distended.   There is contrast outlining unremarkable vaginal walls but no contrast filled fistulous tract to the vagina is seen from the bladder and none was present previously either. Was the vagina inadvertently injected with contrast prior to bladder catheterization?   Bowel: No dilatation or wall thickening. Sigmoid diverticulosis is uncomplicated. There are proximal and distal sigmoid colocolic surgical anastomoses. Moderate stool retention within a mobile cecum. An appendix is not seen in this patient.   Vascular/Lymphatic: Scattered iliofemoral calcific plaques. Distal infrarenal aortic atherosclerosis. No adenopathy is seen.   Reproductive: No mass or other significant abnormality status post hysterectomy.   Other: No free contrast, free air or free fluid. No incarcerated hernia.   Musculoskeletal: Partially visible fusion hardware lower lumbar spine. Osteopenia. Degenerative arthrosis both SI joints, pubic symphysis.   IMPRESSION: 1. The bladder wall is thin and outwardly protruding in the anterior dome eccentric to the left where it was previously torn, but contrast extravasation is no longer seen from this. 2. There is new finding of contrast in the vagina. No contrast filled fistulous tract is seen from the bladder to the vagina. Was the vagina inadvertently injected with contrast prior to bladder catheterization? 3. Diverticulosis without evidence of diverticulitis. 4. Constipation with moderate stool retention in the cecum. 5. Aortic and coronary artery atherosclerosis. 6. Osteopenia and degenerative change.   Aortic Atherosclerosis (ICD10-I70.0).     Electronically Signed   By: KeTelford NabM.D.   On: 04/08/2022 04:12  I personally reviewed the images referenced above and note persistent small outpouching at the bladder dome at the site of former bladder rupture as well as contrast within the vagina, etiology unclear.  Assessment & Plan:   1. Traumatic rupture of bladder, subsequent encounter Bladder perforation is greatly improved over prior, however with some persistent defect at the bladder dome, radiology notes it does not appear fully healed.  Will plan to keep Foley catheter in place an additional 2 weeks and repeat cystogram prior, this time under fluoroscopy for live visualization.  We discussed that the risks of premature Foley removal include wound dehiscence/recurrent bladder rupture, infection, and sepsis.  Will put her on daily suppressive trimethoprim pending follow-up for UTI prevention.  We discussed that the vaginal contrast seen on today's scan is indeterminate but could indicate an underlying vesicovaginal fistula.  I expect this will be further evaluated on follow-up cystogram in 2 weeks.  If diagnosed, we discussed that management options will include conservative management with absorbent products versus referral to urogynecology for consideration of surgical intervention. - IR CYSTOGRAM; Future - trimethoprim (TRIMPEX) 100 MG tablet; Take 1 tablet (100 mg total) by mouth daily for 14 days.  Dispense: 14 tablet; Refill: 0  2. Labial irritation Due to Foley catheter.  I encouraged her to purchase a donut pillow to decrease pressure around the urethra with sitting.  Also encouraged use of barrier cream to reduce irritation.  Return in about 2 weeks (around 04/20/2022) for AM cystogram with office visit for possible Foley removal immediately afterward.  Debroah Loop, PA-C  [facility] [address] [place], [place] [address] [phone]

## 2022-04-20 ENCOUNTER — Ambulatory Visit
Admission: RE | Admit: 2022-04-20 | Discharge: 2022-04-20 | Disposition: A | Discharge status: Home / Self Care | Payer: Medicare Other | Source: Ambulatory Visit | Attending: Physician Assistant | Admitting: Physician Assistant

## 2022-04-20 ENCOUNTER — Other Ambulatory Visit: Payer: Medicare Other | Admitting: Radiology

## 2022-04-20 ENCOUNTER — Ambulatory Visit (INDEPENDENT_AMBULATORY_CARE_PROVIDER_SITE_OTHER): Payer: Medicare Other | Admitting: Physician Assistant

## 2022-04-20 VITALS — BP 145/78 | HR 92

## 2022-04-20 DIAGNOSIS — N820 Vesicovaginal fistula: Secondary | ICD-10-CM

## 2022-04-20 DIAGNOSIS — R310 Gross hematuria: Secondary | ICD-10-CM | POA: Diagnosis not present

## 2022-04-20 DIAGNOSIS — S3729XD Other injury of bladder, subsequent encounter: Secondary | ICD-10-CM

## 2022-04-20 MED ORDER — IOHEXOL 300 MG/ML  SOLN
150.0000 mL | Freq: Once | INTRAMUSCULAR | Status: AC | PRN
  Administered 2022-04-20: 75 mL

## 2022-04-20 NOTE — Progress Notes (Signed)
04/20/2022 2:09 PM   Laurie Hale 11-12-1940 [id number]  CC: Chief Complaint  Patient presents with   Follow-up   HPI: Laurie Hale is a 82 y.o. female with a recent history of multiple falls and traumatic bladder dome perforation being managed conservatively with indwelling Foley catheter who presents today for follow-up cystogram results and possible Foley catheter removal.  She is accompanied today by her daughter, who contributes to HPI.  She completed suppressive trimethoprim 2 days ago and reports feeling well today.  She continues to deny urinary leakage.  Foley catheter in place draining clear, yellow urine.    Cystogram under fluoroscopy today showed no evidence of contrast extravasation from the bladder.  There is a small linear projection of contrast along the posterior margin of the bladder that does not appear to communicate with the vagina.  There was previously concern for a possible vesicovaginal fistula on CT cystogram dated 04/06/2022 due to the presence of contrast seen within the vaginal vault.  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, DCIS   Cataract    immature bilateral   Chronic back pain    Constipation    Coronary artery disease    Diverticulosis    GERD (gastroesophageal reflux disease)    takes a med daily-to bring with med name at surgery   History of migraine    hasn't had one in yrs-per pt Zoloft stopped them   Hyperlipidemia    takes Crestor and Niacin daily   Hypertension    takes Amlodipine  and Lisinopril daily   Hypokalemia    takes K Dur tid   Hypothyroidism    takes Synthroid daily   Insomnia    takes restoril prn   Joint pain    Joint swelling    Peripheral edema    takes HCTZ daily   Personal history of radiation therapy 2014   F/U lumpectomy    Presence of pessary     Surgical History: Past Surgical History:  Procedure Laterality Date   ABDOMINAL HYSTERECTOMY      BACK SURGERY  2009   BREAST BIOPSY Left 10/22/2012   Stereo - DCIS   BREAST LUMPECTOMY Left 2014   DCIS. F/U radiation    CARDIAC CATHETERIZATION  1999   CATARACT EXTRACTION W/PHACO Right 10/05/2015   Procedure: CATARACT EXTRACTION PHACO AND INTRAOCULAR LENS PLACEMENT (IOC);  Surgeon: Ronnell Freshwater, MD;  Location: [facility];  Service: Ophthalmology;  Laterality: Right;  RIGHT   CATARACT EXTRACTION W/PHACO Left 11/16/2015   Procedure: CATARACT EXTRACTION PHACO AND INTRAOCULAR LENS PLACEMENT (IOC);  Surgeon: Ronnell Freshwater, MD;  Location: [facility];  Service: Ophthalmology;  Laterality: Left;  LEFT   COLONOSCOPY     CORONARY ANGIOPLASTY     1 stent   partial coloectomy  1995   right knee arthroscopy  2013   TONSILLECTOMY     TOTAL KNEE ARTHROPLASTY  01/02/2012   RIGHT KNEE   TOTAL KNEE ARTHROPLASTY  01/02/2012   Procedure: TOTAL KNEE ARTHROPLASTY;  Surgeon: Vickey Huger, MD;  Location: [facility];  Service: Orthopedics;  Laterality: Right;   TOTAL KNEE ARTHROPLASTY Left 10/09/2017   Procedure: LEFT TOTAL KNEE ARTHROPLASTY;  Surgeon: Vickey Huger, MD;  Location: [facility];  Service: Orthopedics;  Laterality: Left;  with block    Home Medications:  Allergies as of 04/20/2022       Reactions  Ciprofloxacin    Penicillins Hives            Medication List        Accurate as of April 20, 2022  2:09 PM. If you have any questions, ask your nurse or doctor.          amLODipine 10 MG tablet Commonly known as: NORVASC Take 1 tablet (10 mg total) by mouth daily.   ascorbic acid 500 MG tablet Commonly known as: VITAMIN C Take 1 tablet (500 mg total) by mouth 2 (two) times daily.   aspirin EC 81 MG tablet Take by mouth.   cephALEXin 500 MG capsule Commonly known as: KEFLEX   ezetimibe 10 MG tablet Commonly known as: ZETIA Take by mouth.   feeding supplement Liqd Take 237 mLs by mouth 2 (two) times daily between meals.   fluconazole 150  MG tablet Commonly known as: DIFLUCAN   hydrochlorothiazide 25 MG tablet Commonly known as: HYDRODIURIL Take by mouth.   iron polysaccharides 150 MG capsule Commonly known as: NIFEREX Take 1 capsule (150 mg total) by mouth daily.   levothyroxine 88 MCG tablet Commonly known as: SYNTHROID Take 88 mcg by mouth at bedtime.   levothyroxine 50 MCG tablet Commonly known as: SYNTHROID Take by mouth.   lisinopril 40 MG tablet Commonly known as: ZESTRIL Take 1 tablet (40 mg total) by mouth daily.   meloxicam 15 MG tablet Commonly known as: MOBIC Take 1 tablet by mouth daily.   meloxicam 7.5 MG tablet Commonly known as: MOBIC Take by mouth.   methocarbamol 500 MG tablet Commonly known as: ROBAXIN Take 1 tablet (500 mg total) by mouth every 8 (eight) hours as needed for muscle spasms.   morphine 60 MG 12 hr tablet Commonly known as: MS CONTIN Take by mouth.   multivitamin with minerals Tabs tablet Take 1 tablet by mouth daily.   Neurontin 300 MG capsule Generic drug: gabapentin Take by mouth.   oxyCODONE 5 MG immediate release tablet Commonly known as: Oxy IR/ROXICODONE Take 1 tablet (5 mg total) by mouth every 4 (four) hours as needed for moderate pain.   QUEtiapine 25 MG tablet Commonly known as: SEROQUEL Take 1 tablet (25 mg total) by mouth at bedtime.   rOPINIRole 0.25 MG tablet Commonly known as: REQUIP Take 1 tablet (0.25 mg total) by mouth at bedtime for 7 days, THEN 2 tablets (0.5 mg total) at bedtime. Start taking on: March 02, 2022   rosuvastatin 20 MG tablet Commonly known as: CRESTOR Take 20 mg by mouth every Monday, Wednesday, and Friday.   senna-docusate 8.6-50 MG tablet Commonly known as: Senokot-S Take 2 tablets by mouth 2 (two) times daily.   sertraline 100 MG tablet Commonly known as: ZOLOFT Take by mouth.   sertraline 100 MG tablet Commonly known as: ZOLOFT Take 1.5 tablets (150 mg total) by mouth daily. Take one and a half (1.5)  tablets by mouth once daily   sodium bicarbonate 650 MG tablet Take 1 tablet (650 mg total) by mouth daily.   sulfamethoxazole-trimethoprim 800-160 MG tablet Commonly known as: BACTRIM DS TAKE 1 TABLET BY MOUTH TWICE DAILY FOR 13 DOSES   temazepam 30 MG capsule Commonly known as: RESTORIL Take by mouth.   trimethoprim 100 MG tablet Commonly known as: TRIMPEX Take 1 tablet (100 mg total) by mouth daily for 14 days.   zinc sulfate 220 (50 Zn) MG capsule Take 1 capsule (220 mg total) by mouth daily.  Allergies:  Allergies  Allergen Reactions   Ciprofloxacin    Penicillins Hives          Family History: Family History  Problem Relation Age of Onset   Hypertension Mother    Sudden death Sister        x2   Breast cancer Sister    Heart disease Brother     Social History:   reports that she has never smoked. She has never used smokeless tobacco. She reports that she does not drink alcohol and does not use drugs.  Physical Exam: BP (!) 145/78   Pulse 92   Constitutional:  Alert and oriented, no acute distress, nontoxic appearing HEENT: [place], AT Cardiovascular: No clubbing, cyanosis, or edema Respiratory: Normal respiratory effort, no increased work of breathing Skin: No rashes, bruises or suspicious lesions Neurologic: Grossly intact, no focal deficits, moving all 4 extremities Psychiatric: Normal mood and affect  Pertinent Imaging: Cystogram 04/20/2022: CLINICAL DATA:  Traumatic bladder perforation in the bladder dome from a fall 02/25/2022, managed conservatively with Foley catheter drainage, now presents for follow-up cystogram under fluoroscopy to assess healing. CT cystogram from 04/06/2022 revealed no contrast extravasation, but there was finding of contrast in the vagina. Question of vesicular-vaginal fistula   EXAM: CYSTOGRAM   TECHNIQUE: After catheterization of the urinary bladder following sterile technique the bladder was filled with 150 mL  Omnipaque 300 by drip infusion. Serial spot images were obtained during bladder filling and post draining.   FLUOROSCOPY: Radiation Exposure Index (as provided by the fluoroscopic device): 16.50 mGy Kerma   COMPARISON:  CT cystogram pelvis 04/06/2022   CT cystogram pelvis 02/25/2022   FINDINGS: No evidence of contrast extravasation from bladder. Small linear area of contrast is seen along the posterior margin of the bladder which may reflect a small sinus track extending from the posterior margin of the bladder or bladder trabeculation, but no communication with the vagina is identified. Small amount of residual contrast left in bladder following drainage into Foley catheter   IMPRESSION: 1. Small linear area of contrast is seen along the posterior margin of the bladder which may reflect a small sinus track extending from the posterior margin of the bladder or bladder trabeculation, but no communication with the vagina is identified. If there is persistent clinical concern, a follow-up CT cystogram of the pelvis is recommended. 2. No evidence of bladder rupture or leak.     Electronically Signed   By: Kathreen Devoid M.D.   On: 04/20/2022 13:00  I personally reviewed the images referenced above and note resolution of her bladder dome perforation as well as a small linear projection of contrast projecting from the posterior aspect of the bladder.  Catheter Removal  Patient is present today for a catheter removal.  33m of water was drained from the balloon. A 16FR Silastic foley cath was removed from the bladder, no complications were noted. Patient tolerated well.  Performed by: SDebroah Loop PA-C   Assessment & Plan:   1. Traumatic rupture of bladder, subsequent encounter Resolved on repeat cystogram today.  I removed her Foley catheter in clinic, see procedure note above.  We discussed UTI symptoms and I counseled them to return to clinic if she develops these in  the coming days.  2. Hematuria, gross Resolved, likely secondary to #1 above.  We discussed that when we originally saw her in the ED, plan was to follow-up with outpatient cystoscopy for further evaluation of a posterior bladder mass versus clot.  There has been no persistence of a filling defect on follow-up cystograms, making this far more likely to represent a resolved clot, however given concerns for possible fistula as below, I recommend pursuing cystoscopy regardless.  They are in agreement with this plan.  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  She does have a linear projection of contrast on CT cystogram today emanating from the posterior bladder wall, but it does not appear to communicate with the vagina.  We discussed that even if this does represent vesicovaginal fistula, this could be managed conservatively as otherwise it would require surgical repair with urogynecology.  We discussed that cystoscopy may further evaluate this.  Return in about 4 weeks (around 05/18/2022) for Cysto with Dr. Erlene Quan.  Debroah Loop, PA-C  [facility] [address], [place], [place] [address] [phone]

## 2022-04-20 NOTE — Patient Instructions (Signed)
Recurrent UTI Prevention Strategies  Stay well hydrated. Get a moderate amount of exercise. Eat a diet rich in fruit and vegetables. If you are constipated, start a bowel regimen to manage your constipation. Your goal is to have consistent, formed bowel movements that are easy for you to pass. You may use either of the over-the-counter supplements Benefiber or Miralax to help with this. I recommend that you try Benefiber first and move on to Miralax if this is not helping you enough. You may adjust the recommended dose of Miralax (one capful daily) to achieve this goal. Start taking an over-the-counter cranberry supplement for urinary tract health. Take this once or twice daily on an empty stomach, e.g. right before bed. Start taking an over-the-counter d-mannose supplement. Take this daily per packaging instructions. Start taking an over-the-counter probiotic containing the bacterial species called Lactobacillus. Take this daily.

## 2022-05-17 ENCOUNTER — Other Ambulatory Visit: Payer: Self-pay | Admitting: Physician Assistant

## 2022-05-17 DIAGNOSIS — N3000 Acute cystitis without hematuria: Secondary | ICD-10-CM

## 2022-05-17 MED ORDER — SULFAMETHOXAZOLE-TRIMETHOPRIM 800-160 MG PO TABS: 1.0000 | ORAL_TABLET | Freq: Two times a day (BID) | ORAL | 0 refills | Status: AC

## 2022-05-24 ENCOUNTER — Ambulatory Visit (INDEPENDENT_AMBULATORY_CARE_PROVIDER_SITE_OTHER): Payer: Medicare Other | Admitting: Urology

## 2022-05-24 ENCOUNTER — Encounter: Payer: Self-pay | Admitting: Urology

## 2022-05-24 VITALS — BP 158/83 | HR 94 | Ht 61.0 in | Wt 151.0 lb

## 2022-05-24 DIAGNOSIS — R310 Gross hematuria: Secondary | ICD-10-CM

## 2022-05-24 DIAGNOSIS — S3729XD Other injury of bladder, subsequent encounter: Secondary | ICD-10-CM

## 2022-05-24 LAB — URINALYSIS, COMPLETE
Bilirubin, UA: NEGATIVE
Glucose, UA: NEGATIVE
Ketones, UA: NEGATIVE
Leukocytes,UA: NEGATIVE
Nitrite, UA: NEGATIVE
Protein,UA: NEGATIVE
RBC, UA: NEGATIVE
Specific Gravity, UA: >1.03 — ABNORMAL HIGH (ref 1.005–1.030)
Urobilinogen, Ur: 0.2 mg/dL (ref 0.2–1.0)
pH, UA: 5 (ref 5.0–7.5)

## 2022-05-24 LAB — MICROSCOPIC EXAMINATION

## 2022-05-24 NOTE — Progress Notes (Signed)
   05/24/22  CC:  Chief Complaint  Patient presents with   Cysto    HPI: 82 year old female who presents today for cystoscopy.  Notably, she has a personal history of traumatic bladder rupture which was managed conservatively.  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  There is a note from last week indicating that there is some concern for urinary tract infection based on nonspecific symptoms.  She was prescribed antibiotics empirically by Carman Ching.  Her urinalysis today is negative.  Blood pressure (!) 158/83, pulse 94, height 5\' 1"  (1.549 m), weight 151 lb (68.5 kg). NED. A&Ox3.   No respiratory distress   Abd soft, NT, ND Normal external genitalia with patent urethral meatus.  Large grade 3 cystocele appreciated, manually reduced in order to visualize meatus.  Cystoscopy Procedure Note  Patient identification was confirmed, informed consent was obtained, and patient was prepped using Betadine solution.  Lidocaine jelly was administered per urethral meatus.    Procedure: - Flexible cystoscope introduced, without any difficulty.   - Thorough search of the bladder revealed:    normal urethral meatus    normal urothelium with 1 area at the left dome that had a spherical scarlike impression, suspected site of previous rupture.    no stones    no ulcers     no tumors    no urethral polyps    mild trabeculation  - Ureteral orifices were normal in position and appearance.  There is descent of the anterior bladder wall into the vagina.  Post-Procedure: - Patient tolerated the procedure well  Assessment/ Plan:  1. Traumatic rupture of bladder, subsequent encounter Bladder integrity is excellent today, no evidence of fistulous tract or significant pathology which is very reassuring  She may follow-up at this point as needed  2. Hematuria, gross As above - Urinalysis, Complete   Vanna Scotland, MD

## 2022-09-23 ENCOUNTER — Ambulatory Visit: Payer: Medicare Other | Admitting: Physician Assistant

## 2022-09-27 ENCOUNTER — Ambulatory Visit (INDEPENDENT_AMBULATORY_CARE_PROVIDER_SITE_OTHER): Payer: Medicare Other | Admitting: Physician Assistant

## 2022-09-27 ENCOUNTER — Encounter: Payer: Self-pay | Admitting: Physician Assistant

## 2022-09-27 VITALS — BP 198/80 | HR 89 | Ht 61.0 in | Wt 150.0 lb

## 2022-09-27 DIAGNOSIS — Z87440 Personal history of urinary (tract) infections: Secondary | ICD-10-CM

## 2022-09-27 DIAGNOSIS — N8111 Cystocele, midline: Secondary | ICD-10-CM

## 2022-09-27 DIAGNOSIS — N3941 Urge incontinence: Secondary | ICD-10-CM | POA: Diagnosis not present

## 2022-09-27 LAB — URINALYSIS, COMPLETE
Bilirubin, UA: NEGATIVE
Glucose, UA: NEGATIVE
Ketones, UA: NEGATIVE
Leukocytes,UA: NEGATIVE
Nitrite, UA: NEGATIVE
Specific Gravity, UA: 1.025 (ref 1.005–1.030)
Urobilinogen, Ur: 0.2 mg/dL (ref 0.2–1.0)
pH, UA: 6 (ref 5.0–7.5)

## 2022-09-27 LAB — MICROSCOPIC EXAMINATION

## 2022-09-27 MED ORDER — GEMTESA 75 MG PO TABS: 75.0000 mg | ORAL_TABLET | Freq: Every day | ORAL | Status: DC

## 2022-09-27 NOTE — Progress Notes (Signed)
09/27/2022 3:15 PM   Laurie Hale [birth year] [id number]  CC: Chief Complaint  Patient presents with   Dysuria   HPI: Laurie Hale is a 82 y.o. 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  She is accompanied today by her daughter, who contributes to HPI.  Today her daughter reports months of increased urinary leakage, especially overnight.  There will be multiple pairs of wet pants in the laundry every day.  Patient admits to urinary urgency, frequency, and leakage, but attributes her leakage to "waiting too long."  She denies dysuria.  She was treated with antibiotics for possible UTI via virtual visit about 3 weeks ago but present today after no significant symptomatic improvement was noted.  Patient reports minimal bother from her symptoms and is hesitant to pursue treatment for them.  In-office catheterized UA today positive for trace intact blood and 1+ protein; urine microscopy with 3-10 RBCs/HPF.  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, DCIS   Cataract    immature bilateral   Chronic back pain    Constipation    Coronary artery disease    Diverticulosis    GERD (gastroesophageal reflux disease)    takes a med daily-to bring with med name at surgery   History of migraine    hasn't had one in yrs-per pt Zoloft stopped them   Hyperlipidemia    takes Crestor and Niacin daily   Hypertension    takes Amlodipine  and Lisinopril daily   Hypokalemia    takes K Dur tid   Hypothyroidism    takes Synthroid daily   Insomnia    takes restoril prn   Joint pain    Joint swelling    Peripheral edema    takes HCTZ daily   Personal history of radiation therapy 2014   F/U lumpectomy    Presence of pessary     Surgical History: Past Surgical History:  Procedure Laterality Date   ABDOMINAL  HYSTERECTOMY     BACK SURGERY  2009   BREAST BIOPSY Left 10/22/2012   Stereo - DCIS   BREAST LUMPECTOMY Left 2014   DCIS. F/U radiation    CARDIAC CATHETERIZATION  1999   CATARACT EXTRACTION W/PHACO Right 10/05/2015   Procedure: CATARACT EXTRACTION PHACO AND INTRAOCULAR LENS PLACEMENT (IOC);  Surgeon: Sherald Hess, MD;  Location: [facility];  Service: Ophthalmology;  Laterality: Right;  RIGHT   CATARACT EXTRACTION W/PHACO Left 11/16/2015   Procedure: CATARACT EXTRACTION PHACO AND INTRAOCULAR LENS PLACEMENT (IOC);  Surgeon: Sherald Hess, MD;  Location: [facility];  Service: Ophthalmology;  Laterality: Left;  LEFT   COLONOSCOPY     CORONARY ANGIOPLASTY     1 stent   partial coloectomy  1995   right knee arthroscopy  2013   TONSILLECTOMY     TOTAL KNEE ARTHROPLASTY  01/02/2012   RIGHT KNEE   TOTAL KNEE ARTHROPLASTY  01/02/2012   Procedure: TOTAL KNEE ARTHROPLASTY;  Surgeon: Dannielle Huh, MD;  Location: [facility];  Service: Orthopedics;  Laterality: Right;   TOTAL KNEE ARTHROPLASTY Left 10/09/2017   Procedure: LEFT TOTAL KNEE ARTHROPLASTY;  Surgeon: Dannielle Huh, MD;  Location: [facility];  Service: Orthopedics;  Laterality: Left;  with block    Home Medications:  Allergies as of 09/27/2022  Reactions   Ciprofloxacin    Penicillins Hives            Medication List        Accurate as of September 27, 2022  3:15 PM. If you have any questions, ask your nurse or doctor.          STOP taking these medications    ascorbic acid 500 MG tablet Commonly known as: VITAMIN C Stopped by: Carman Ching   aspirin EC 81 MG tablet Stopped by: Carman Ching   cephALEXin 500 MG capsule Commonly known as: KEFLEX Stopped by: Carman Ching   ezetimibe 10 MG tablet Commonly known as: ZETIA Stopped by: Carman Ching   feeding supplement Liqd Stopped by: Carman Ching   fluconazole 150 MG tablet Commonly  known as: DIFLUCAN Stopped by: Carman Ching   iron polysaccharides 150 MG capsule Commonly known as: NIFEREX Stopped by: Carman Ching   meloxicam 15 MG tablet Commonly known as: MOBIC Stopped by: Carman Ching   meloxicam 7.5 MG tablet Commonly known as: MOBIC Stopped by: Carman Ching   methocarbamol 500 MG tablet Commonly known as: ROBAXIN Stopped by: Carman Ching   morphine 60 MG 12 hr tablet Commonly known as: MS CONTIN Stopped by: Carman Ching   multivitamin with minerals Tabs tablet Stopped by: Carman Ching   Neurontin 300 MG capsule Generic drug: gabapentin Stopped by: Carman Ching   oxyCODONE 5 MG immediate release tablet Commonly known as: Oxy IR/ROXICODONE Stopped by: Carman Ching   QUEtiapine 25 MG tablet Commonly known as: SEROQUEL Stopped by: Carman Ching   rOPINIRole 0.25 MG tablet Commonly known as: REQUIP Stopped by: Carman Ching   senna-docusate 8.6-50 MG tablet Commonly known as: Senokot-S Stopped by: Carman Ching   sertraline 100 MG tablet Commonly known as: ZOLOFT Stopped by: Carman Ching   sodium bicarbonate 650 MG tablet Stopped by: Lelon Mast Kayon Dozier   temazepam 30 MG capsule Commonly known as: RESTORIL Stopped by: Carman Ching   zinc sulfate 220 (50 Zn) MG capsule Stopped by: Carman Ching       TAKE these medications    amLODipine 10 MG tablet Commonly known as: NORVASC Take 1 tablet (10 mg total) by mouth daily.   hydrochlorothiazide 25 MG tablet Commonly known as: HYDRODIURIL Take by mouth.   levothyroxine 88 MCG tablet Commonly known as: SYNTHROID Take 88 mcg by mouth at bedtime. What changed: Another medication with the same name was removed. Continue taking this medication, and follow the directions you see here. Changed by: Carman Ching   lisinopril 40 MG  tablet Commonly known as: ZESTRIL Take 1 tablet (40 mg total) by mouth daily.   rosuvastatin 20 MG tablet Commonly known as: CRESTOR Take 20 mg by mouth every Monday, Wednesday, and Friday.        Allergies:  Allergies  Allergen Reactions   Ciprofloxacin    Penicillins Hives          Family History: Family History  Problem Relation Age of Onset   Hypertension Mother    Sudden death Sister        x2   Breast cancer Sister    Heart disease Brother     Social History:   reports that she has never smoked. She has never used smokeless tobacco. She reports that she does not drink alcohol and does not use drugs.  Physical Exam: BP (!) 198/80   Pulse 89   Ht 5\' 1"  (1.549 m)   Wt 150 lb (68 kg)  BMI 28.34 kg/m   Constitutional:  Alert and oriented, no acute distress, nontoxic appearing HEENT: [place], AT Cardiovascular: No clubbing, cyanosis, or edema Respiratory: Normal respiratory effort, no increased work of breathing GU: Grade 1 cystocele Skin: No rashes, bruises or suspicious lesions Neurologic: Grossly intact, no focal deficits, moving all 4 extremities Psychiatric: Normal mood and affect  Laboratory Data: Results for orders placed or performed in visit on 09/27/22  Microscopic Examination   Urine  Result Value Ref Range   WBC, UA 0-5 0 - 5 /hpf   RBC, Urine 3-10 (A) 0 - 2 /hpf   Epithelial Cells (non renal) 0-10 0 - 10 /hpf   Mucus, UA Present (A) Not Estab.   Bacteria, UA Few None seen/Few  Urinalysis, Complete  Result Value Ref Range   Specific Gravity, UA 1.025 1.005 - 1.030   pH, UA 6.0 5.0 - 7.5   Color, UA Yellow Yellow   Appearance Ur Clear Clear   Leukocytes,UA Negative Negative   Protein,UA 1+ (A) Negative/Trace   Glucose, UA Negative Negative   Ketones, UA Negative Negative   RBC, UA Trace (A) Negative   Bilirubin, UA Negative Negative   Urobilinogen, Ur 0.2 0.2 - 1.0 mg/dL   Nitrite, UA Negative Negative   Microscopic Examination See  below:    In and Out Catheterization  Patient is present today for a I & O catheterization due to urinary incontinence. Patient was cleaned and prepped in a sterile fashion with betadine . A 14FR cath was inserted no complications were noted , 60ml of urine return was noted, urine was yellow in color. A clean urine sample was collected for UA/culture. Bladder was drained and catheter was removed without difficulty.    Performed by: Carman Ching, PA-C   Assessment & Plan:   1. Urge incontinence of urine UA today is bland, suspect micro heme is due to in and out catheterization.  She was initially reluctant to start treatment for her urge incontinence, but ultimately her daughter convinced her to try Gemtesa samples, which were provided for her today. - Urinalysis, Complete - Vibegron (GEMTESA) 75 MG TABS; Take 1 tablet (75 mg total) by mouth daily.  2. Cystocele, midline Grade 1 noted on physical exam today, Dr. Apolinar Junes previously noted grade 3 on prior cystoscopy.  Recommend conservative management with normal measured residual today.  Return in about 6 weeks (around 11/08/2022) for Symptom recheck with PVR.  Carman Ching, PA-C  [facility] [place] [address], [address] [place], [place] [address] [phone]

## 2022-11-08 ENCOUNTER — Ambulatory Visit: Payer: Medicare Other | Admitting: Physician Assistant

## 2022-11-08 ENCOUNTER — Encounter: Payer: Self-pay | Admitting: Physician Assistant

## 2022-11-08 VITALS — BP 157/79 | HR 70 | Ht 61.0 in | Wt 150.0 lb

## 2022-11-08 DIAGNOSIS — N3941 Urge incontinence: Secondary | ICD-10-CM

## 2022-11-08 LAB — BLADDER SCAN AMB NON-IMAGING: Scan Result: 77

## 2022-11-08 MED ORDER — GEMTESA 75 MG PO TABS
75.0000 mg | ORAL_TABLET | Freq: Every day | ORAL | 11 refills | Status: DC
Start: 2022-11-08 — End: 2022-11-25

## 2022-11-08 NOTE — Progress Notes (Unsigned)
11/08/2022 11:20 AM   Laurie Hale February 26, 1940 [id number]  CC: Chief Complaint  Patient presents with   Follow-up   HPI: Laurie Hale is a 82 y.o. 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   Today she reports ***  PVR 77mL.  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, DCIS   Cataract    immature bilateral   Chronic back pain    Constipation    Coronary artery disease    Diverticulosis    GERD (gastroesophageal reflux disease)    takes a med daily-to bring with med name at surgery   History of migraine    hasn't had one in yrs-per pt Zoloft stopped them   Hyperlipidemia    takes Crestor and Niacin daily   Hypertension    takes Amlodipine  and Lisinopril daily   Hypokalemia    takes K Dur tid   Hypothyroidism    takes Synthroid daily   Insomnia    takes restoril prn   Joint pain    Joint swelling    Peripheral edema    takes HCTZ daily   Personal history of radiation therapy 2014   F/U lumpectomy    Presence of pessary     Surgical History: Past Surgical History:  Procedure Laterality Date   ABDOMINAL HYSTERECTOMY     BACK SURGERY  2009   BREAST BIOPSY Left 10/22/2012   Stereo - DCIS   BREAST LUMPECTOMY Left 2014   DCIS. F/U radiation    CARDIAC CATHETERIZATION  1999   CATARACT EXTRACTION W/PHACO Right 10/05/2015   Procedure: CATARACT EXTRACTION PHACO AND INTRAOCULAR LENS PLACEMENT (IOC);  Surgeon: Sherald Hess, MD;  Location: [facility];  Service: Ophthalmology;  Laterality: Right;  RIGHT   CATARACT EXTRACTION W/PHACO Left 11/16/2015   Procedure: CATARACT EXTRACTION PHACO AND INTRAOCULAR LENS PLACEMENT (IOC);  Surgeon: Sherald Hess, MD;  Location: [facility];  Service: Ophthalmology;  Laterality: Left;  LEFT    COLONOSCOPY     CORONARY ANGIOPLASTY     1 stent   partial coloectomy  1995   right knee arthroscopy  2013   TONSILLECTOMY     TOTAL KNEE ARTHROPLASTY  01/02/2012   RIGHT KNEE   TOTAL KNEE ARTHROPLASTY  01/02/2012   Procedure: TOTAL KNEE ARTHROPLASTY;  Surgeon: Dannielle Huh, MD;  Location: [facility];  Service: Orthopedics;  Laterality: Right;   TOTAL KNEE ARTHROPLASTY Left 10/09/2017   Procedure: LEFT TOTAL KNEE ARTHROPLASTY;  Surgeon: Dannielle Huh, MD;  Location: [facility];  Service: Orthopedics;  Laterality: Left;  with block    Home Medications:  Allergies as of 11/08/2022       Reactions   Ciprofloxacin    Penicillins Hives            Medication List        Accurate as of November 08, 2022 11:20 AM. If you have any questions, ask your nurse or doctor.          amLODipine 10 MG tablet Commonly known as: NORVASC Take 1 tablet (10 mg total) by mouth daily.   Gemtesa 75 MG Tabs Generic drug: Vibegron Take 1 tablet (75 mg total) by mouth daily.   hydrochlorothiazide 25 MG tablet Commonly known as: HYDRODIURIL Take by mouth.  levothyroxine 88 MCG tablet Commonly known as: SYNTHROID Take 88 mcg by mouth at bedtime.   lisinopril 40 MG tablet Commonly known as: ZESTRIL Take 1 tablet (40 mg total) by mouth daily.   potassium chloride 10 MEQ tablet Commonly known as: KLOR-CON Take by mouth.   rosuvastatin 20 MG tablet Commonly known as: CRESTOR Take 20 mg by mouth every Monday, Wednesday, and Friday.   sertraline 100 MG tablet Commonly known as: ZOLOFT Take by mouth.        Allergies:  Allergies  Allergen Reactions   Ciprofloxacin    Penicillins Hives          Family History: Family History  Problem Relation Age of Onset   Hypertension Mother    Sudden death Sister        x2   Breast cancer Sister    Heart disease Brother     Social History:   reports that she has never smoked. She has never used smokeless tobacco. She reports that she does  not drink alcohol and does not use drugs.  Physical Exam: BP (!) 157/79   Pulse 70   Ht 5\' 1"  (1.549 m)   Wt 150 lb (68 kg)   BMI 28.34 kg/m   Constitutional:  Alert and oriented, no acute distress, nontoxic appearing HEENT: [place], AT Cardiovascular: No clubbing, cyanosis, or edema Respiratory: Normal respiratory effort, no increased work of breathing GI: Abdomen is soft, nontender, nondistended, no abdominal masses GU: No CVA tenderness Lymph: No cervical or inguinal lymphadenopathy Skin: No rashes, bruises or suspicious lesions Neurologic: Grossly intact, no focal deficits, moving all 4 extremities Psychiatric: Normal mood and affect  Laboratory Data: Lab Results  Component Value Date   WBC 8.4 03/02/2022   HGB 10.0 (L) 03/02/2022   HCT 31.2 (L) 03/02/2022   MCV 89.7 03/02/2022   PLT 350 03/02/2022    Lab Results  Component Value Date   CREATININE 0.68 03/02/2022    CrCl cannot be calculated (Patient's most recent lab result is older than the maximum 21 days allowed.).  Results for orders placed or performed in visit on 09/27/22  Microscopic Examination   Urine  Result Value Ref Range   WBC, UA 0-5 0 - 5 /hpf   RBC, Urine 3-10 (A) 0 - 2 /hpf   Epithelial Cells (non renal) 0-10 0 - 10 /hpf   Mucus, UA Present (A) Not Estab.   Bacteria, UA Few None seen/Few  Urinalysis, Complete  Result Value Ref Range   Specific Gravity, UA 1.025 1.005 - 1.030   pH, UA 6.0 5.0 - 7.5   Color, UA Yellow Yellow   Appearance Ur Clear Clear   Leukocytes,UA Negative Negative   Protein,UA 1+ (A) Negative/Trace   Glucose, UA Negative Negative   Ketones, UA Negative Negative   RBC, UA Trace (A) Negative   Bilirubin, UA Negative Negative   Urobilinogen, Ur 0.2 0.2 - 1.0 mg/dL   Nitrite, UA Negative Negative   Microscopic Examination See below:     ***  Pertinent Imaging: *** No results found for this or any previous visit.  No results found for this or any previous  visit.  No results found for this or any previous visit.  No results found for this or any previous visit.  No results found for this or any previous visit.  No valid procedures specified. No results found for this or any previous visit.  No results found for this or any previous visit.  I personally reviewed the images referenced above and ***.  Assessment & Plan:   1. Urge incontinence of urine *** - Urinalysis, Complete - BLADDER SCAN AMB NON-IMAGING   No follow-ups on file.  Carman Ching, PA-C  [facility] [place] [address], [address] [place], [place] [address] [phone]

## 2022-11-25 DIAGNOSIS — N3941 Urge incontinence: Secondary | ICD-10-CM

## 2022-11-25 MED ORDER — TROSPIUM CHLORIDE ER 60 MG PO CP24
1.0000 | ORAL_CAPSULE | Freq: Every day | ORAL | 2 refills | Status: DC
Start: 2022-11-25 — End: 2022-12-13

## 2022-12-13 ENCOUNTER — Other Ambulatory Visit: Payer: Self-pay | Admitting: Physician Assistant

## 2022-12-20 ENCOUNTER — Ambulatory Visit: Payer: Medicare Other | Admitting: Physician Assistant

## 2023-05-25 ENCOUNTER — Other Ambulatory Visit

## 2023-05-25 ENCOUNTER — Other Ambulatory Visit: Payer: Self-pay

## 2023-05-25 DIAGNOSIS — N3941 Urge incontinence: Secondary | ICD-10-CM

## 2023-05-26 ENCOUNTER — Other Ambulatory Visit

## 2023-05-26 DIAGNOSIS — R300 Dysuria: Secondary | ICD-10-CM

## 2023-05-26 LAB — MICROSCOPIC EXAMINATION: Epithelial Cells (non renal): >10 /HPF — AB (ref 0–10)

## 2023-05-26 LAB — URINALYSIS, COMPLETE
Bilirubin, UA: NEGATIVE
Glucose, UA: NEGATIVE
Ketones, UA: NEGATIVE
Nitrite, UA: POSITIVE — AB
Protein,UA: NEGATIVE
Specific Gravity, UA: 1.025 (ref 1.005–1.030)
Urobilinogen, Ur: 0.2 mg/dL (ref 0.2–1.0)
pH, UA: 5.5 (ref 5.0–7.5)

## 2023-05-26 MED ORDER — SULFAMETHOXAZOLE-TRIMETHOPRIM 800-160 MG PO TABS: 1.0000 | ORAL_TABLET | Freq: Two times a day (BID) | ORAL | 0 refills | Status: AC

## 2023-05-30 LAB — CULTURE, URINE COMPREHENSIVE

## 2023-06-03 ENCOUNTER — Emergency Department

## 2023-06-03 ENCOUNTER — Inpatient Hospital Stay (HOSPITAL_COMMUNITY)
Admit: 2023-06-03 | Discharge: 2023-06-03 | Disposition: A | Discharge status: Home / Self Care | Attending: Internal Medicine

## 2023-06-03 ENCOUNTER — Other Ambulatory Visit: Payer: Self-pay

## 2023-06-03 ENCOUNTER — Inpatient Hospital Stay
Admission: EM | Admit: 2023-06-03 | Discharge: 2023-06-08 | DRG: 280 | Disposition: A | Discharge status: Hospice | Attending: Internal Medicine | Admitting: Internal Medicine

## 2023-06-03 DIAGNOSIS — G8191 Hemiplegia, unspecified affecting right dominant side: Secondary | ICD-10-CM | POA: Diagnosis not present

## 2023-06-03 DIAGNOSIS — W19XXXA Unspecified fall, initial encounter: Secondary | ICD-10-CM | POA: Diagnosis present

## 2023-06-03 DIAGNOSIS — R4701 Aphasia: Secondary | ICD-10-CM | POA: Diagnosis not present

## 2023-06-03 DIAGNOSIS — I63512 Cerebral infarction due to unspecified occlusion or stenosis of left middle cerebral artery: Secondary | ICD-10-CM | POA: Diagnosis not present

## 2023-06-03 DIAGNOSIS — E039 Hypothyroidism, unspecified: Secondary | ICD-10-CM | POA: Diagnosis present

## 2023-06-03 DIAGNOSIS — I639 Cerebral infarction, unspecified: Secondary | ICD-10-CM | POA: Diagnosis not present

## 2023-06-03 DIAGNOSIS — Z7409 Other reduced mobility: Secondary | ICD-10-CM | POA: Diagnosis present

## 2023-06-03 DIAGNOSIS — G9349 Other encephalopathy: Secondary | ICD-10-CM | POA: Diagnosis not present

## 2023-06-03 DIAGNOSIS — E669 Obesity, unspecified: Secondary | ICD-10-CM | POA: Diagnosis present

## 2023-06-03 DIAGNOSIS — F339 Major depressive disorder, recurrent, unspecified: Secondary | ICD-10-CM | POA: Diagnosis present

## 2023-06-03 DIAGNOSIS — N170 Acute kidney failure with tubular necrosis: Secondary | ICD-10-CM | POA: Diagnosis present

## 2023-06-03 DIAGNOSIS — Y92003 Bedroom of unspecified non-institutional (private) residence as the place of occurrence of the external cause: Secondary | ICD-10-CM

## 2023-06-03 DIAGNOSIS — M79604 Pain in right leg: Secondary | ICD-10-CM | POA: Diagnosis not present

## 2023-06-03 DIAGNOSIS — M25551 Pain in right hip: Secondary | ICD-10-CM | POA: Diagnosis present

## 2023-06-03 DIAGNOSIS — I214 Non-ST elevation (NSTEMI) myocardial infarction: Secondary | ICD-10-CM

## 2023-06-03 DIAGNOSIS — D509 Iron deficiency anemia, unspecified: Secondary | ICD-10-CM | POA: Diagnosis present

## 2023-06-03 DIAGNOSIS — Z9049 Acquired absence of other specified parts of digestive tract: Secondary | ICD-10-CM

## 2023-06-03 DIAGNOSIS — Z853 Personal history of malignant neoplasm of breast: Secondary | ICD-10-CM

## 2023-06-03 DIAGNOSIS — Z803 Family history of malignant neoplasm of breast: Secondary | ICD-10-CM

## 2023-06-03 DIAGNOSIS — Z66 Do not resuscitate: Secondary | ICD-10-CM | POA: Diagnosis present

## 2023-06-03 DIAGNOSIS — Z515 Encounter for palliative care: Secondary | ICD-10-CM

## 2023-06-03 DIAGNOSIS — Z881 Allergy status to other antibiotic agents status: Secondary | ICD-10-CM

## 2023-06-03 DIAGNOSIS — N390 Urinary tract infection, site not specified: Secondary | ICD-10-CM | POA: Diagnosis present

## 2023-06-03 DIAGNOSIS — G8929 Other chronic pain: Secondary | ICD-10-CM | POA: Diagnosis present

## 2023-06-03 DIAGNOSIS — I130 Hypertensive heart and chronic kidney disease with heart failure and stage 1 through stage 4 chronic kidney disease, or unspecified chronic kidney disease: Secondary | ICD-10-CM | POA: Diagnosis present

## 2023-06-03 DIAGNOSIS — Z1152 Encounter for screening for COVID-19: Secondary | ICD-10-CM | POA: Diagnosis not present

## 2023-06-03 DIAGNOSIS — S8251XA Displaced fracture of medial malleolus of right tibia, initial encounter for closed fracture: Secondary | ICD-10-CM | POA: Diagnosis present

## 2023-06-03 DIAGNOSIS — I5021 Acute systolic (congestive) heart failure: Secondary | ICD-10-CM | POA: Diagnosis present

## 2023-06-03 DIAGNOSIS — Z955 Presence of coronary angioplasty implant and graft: Secondary | ICD-10-CM

## 2023-06-03 DIAGNOSIS — E7800 Pure hypercholesterolemia, unspecified: Secondary | ICD-10-CM | POA: Diagnosis present

## 2023-06-03 DIAGNOSIS — Z7189 Other specified counseling: Secondary | ICD-10-CM | POA: Diagnosis not present

## 2023-06-03 DIAGNOSIS — R29727 NIHSS score 27: Secondary | ICD-10-CM | POA: Diagnosis not present

## 2023-06-03 DIAGNOSIS — I10 Essential (primary) hypertension: Secondary | ICD-10-CM | POA: Diagnosis present

## 2023-06-03 DIAGNOSIS — Z880 Allergy status to penicillin: Secondary | ICD-10-CM

## 2023-06-03 DIAGNOSIS — Z923 Personal history of irradiation: Secondary | ICD-10-CM

## 2023-06-03 DIAGNOSIS — Z6834 Body mass index (BMI) 34.0-34.9, adult: Secondary | ICD-10-CM

## 2023-06-03 DIAGNOSIS — N179 Acute kidney failure, unspecified: Secondary | ICD-10-CM | POA: Diagnosis present

## 2023-06-03 DIAGNOSIS — M6282 Rhabdomyolysis: Secondary | ICD-10-CM | POA: Diagnosis present

## 2023-06-03 DIAGNOSIS — K219 Gastro-esophageal reflux disease without esophagitis: Secondary | ICD-10-CM | POA: Diagnosis present

## 2023-06-03 DIAGNOSIS — I428 Other cardiomyopathies: Secondary | ICD-10-CM | POA: Diagnosis present

## 2023-06-03 DIAGNOSIS — I2510 Atherosclerotic heart disease of native coronary artery without angina pectoris: Secondary | ICD-10-CM | POA: Diagnosis present

## 2023-06-03 DIAGNOSIS — N182 Chronic kidney disease, stage 2 (mild): Secondary | ICD-10-CM | POA: Diagnosis present

## 2023-06-03 DIAGNOSIS — R400 Somnolence: Secondary | ICD-10-CM

## 2023-06-03 DIAGNOSIS — I959 Hypotension, unspecified: Secondary | ICD-10-CM | POA: Diagnosis not present

## 2023-06-03 DIAGNOSIS — Z96659 Presence of unspecified artificial knee joint: Secondary | ICD-10-CM

## 2023-06-03 DIAGNOSIS — R54 Age-related physical debility: Secondary | ICD-10-CM | POA: Diagnosis present

## 2023-06-03 DIAGNOSIS — Z8249 Family history of ischemic heart disease and other diseases of the circulatory system: Secondary | ICD-10-CM

## 2023-06-03 DIAGNOSIS — Z96653 Presence of artificial knee joint, bilateral: Secondary | ICD-10-CM | POA: Diagnosis present

## 2023-06-03 DIAGNOSIS — R569 Unspecified convulsions: Secondary | ICD-10-CM | POA: Diagnosis not present

## 2023-06-03 DIAGNOSIS — M79605 Pain in left leg: Secondary | ICD-10-CM

## 2023-06-03 DIAGNOSIS — Z789 Other specified health status: Secondary | ICD-10-CM | POA: Diagnosis not present

## 2023-06-03 DIAGNOSIS — M545 Low back pain, unspecified: Secondary | ICD-10-CM | POA: Diagnosis present

## 2023-06-03 DIAGNOSIS — G4700 Insomnia, unspecified: Secondary | ICD-10-CM | POA: Diagnosis present

## 2023-06-03 DIAGNOSIS — R29810 Facial weakness: Secondary | ICD-10-CM | POA: Diagnosis not present

## 2023-06-03 DIAGNOSIS — R296 Repeated falls: Secondary | ICD-10-CM | POA: Diagnosis present

## 2023-06-03 DIAGNOSIS — Z79890 Hormone replacement therapy (postmenopausal): Secondary | ICD-10-CM

## 2023-06-03 DIAGNOSIS — Z79899 Other long term (current) drug therapy: Secondary | ICD-10-CM

## 2023-06-03 DIAGNOSIS — E876 Hypokalemia: Secondary | ICD-10-CM | POA: Diagnosis present

## 2023-06-03 LAB — BASIC METABOLIC PANEL WITH GFR
Anion gap: 13 (ref 5–15)
BUN: 33 mg/dL — ABNORMAL HIGH (ref 8–23)
CO2: 21 mmol/L — ABNORMAL LOW (ref 22–32)
Calcium: 9.5 mg/dL (ref 8.9–10.3)
Chloride: 104 mmol/L (ref 98–111)
Creatinine, Ser: 1.47 mg/dL — ABNORMAL HIGH (ref 0.44–1.00)
GFR, Estimated: 35 mL/min — ABNORMAL LOW (ref >60)
Glucose, Bld: 223 mg/dL — ABNORMAL HIGH (ref 70–99)
Potassium: 4 mmol/L (ref 3.5–5.1)
Sodium: 138 mmol/L (ref 135–145)

## 2023-06-03 LAB — RESP PANEL BY RT-PCR (RSV, FLU A&B, COVID)  RVPGX2
Influenza A by PCR: NEGATIVE
Influenza B by PCR: NEGATIVE
Resp Syncytial Virus by PCR: NEGATIVE
SARS Coronavirus 2 by RT PCR: NEGATIVE

## 2023-06-03 LAB — CBC
HCT: 41.2 % (ref 36.0–46.0)
Hemoglobin: 13.6 g/dL (ref 12.0–15.0)
MCH: 30.4 pg (ref 26.0–34.0)
MCHC: 33 g/dL (ref 30.0–36.0)
MCV: 92 fL (ref 80.0–100.0)
Platelets: 291 10*3/uL (ref 150–400)
RBC: 4.48 MIL/uL (ref 3.87–5.11)
RDW: 13.4 % (ref 11.5–15.5)
WBC: 17.8 10*3/uL — ABNORMAL HIGH (ref 4.0–10.5)
nRBC: 0 % (ref 0.0–0.2)

## 2023-06-03 LAB — CK: Total CK: 6330 U/L — ABNORMAL HIGH (ref 38–234)

## 2023-06-03 LAB — PROTIME-INR
INR: 1.2 (ref 0.8–1.2)
Prothrombin Time: 15.1 s (ref 11.4–15.2)

## 2023-06-03 LAB — HEPARIN LEVEL (UNFRACTIONATED): Heparin Unfractionated: 0.45 [IU]/mL (ref 0.30–0.70)

## 2023-06-03 LAB — APTT: aPTT: 26 s (ref 24–36)

## 2023-06-03 LAB — TROPONIN I (HIGH SENSITIVITY): Troponin I (High Sensitivity): 3186 ng/L (ref <18)

## 2023-06-03 LAB — CBG MONITORING, ED: Glucose-Capillary: 226 mg/dL — ABNORMAL HIGH (ref 70–99)

## 2023-06-03 MED ORDER — SERTRALINE HCL 50 MG PO TABS
150.0000 mg | ORAL_TABLET | Freq: Every day | ORAL | Status: DC
  Administered 2023-06-03 – 2023-06-04 (×2): 150 mg via ORAL
  Filled 2023-06-03 (×2): qty 3

## 2023-06-03 MED ORDER — ACETAMINOPHEN 325 MG PO TABS
650.0000 mg | ORAL_TABLET | Freq: Four times a day (QID) | ORAL | Status: AC | PRN
  Administered 2023-06-03: 650 mg via ORAL
  Filled 2023-06-03: qty 2

## 2023-06-03 MED ORDER — MORPHINE SULFATE (PF) 4 MG/ML IV SOLN
4.0000 mg | INTRAVENOUS | Status: AC | PRN
  Administered 2023-06-04 (×2): 4 mg via INTRAVENOUS
  Filled 2023-06-03 (×3): qty 1

## 2023-06-03 MED ORDER — SENNOSIDES-DOCUSATE SODIUM 8.6-50 MG PO TABS: 1.0000 | ORAL_TABLET | Freq: Every evening | ORAL | Status: DC | PRN

## 2023-06-03 MED ORDER — FENTANYL CITRATE PF 50 MCG/ML IJ SOSY
50.0000 ug | PREFILLED_SYRINGE | Freq: Once | INTRAMUSCULAR | Status: AC
  Administered 2023-06-03: 50 ug via INTRAVENOUS
  Filled 2023-06-03: qty 1

## 2023-06-03 MED ORDER — HYDRALAZINE HCL 20 MG/ML IJ SOLN: 5.0000 mg | Freq: Four times a day (QID) | INTRAMUSCULAR | Status: DC | PRN

## 2023-06-03 MED ORDER — FENTANYL CITRATE PF 50 MCG/ML IJ SOSY
25.0000 ug | PREFILLED_SYRINGE | INTRAMUSCULAR | Status: AC | PRN
  Administered 2023-06-03 – 2023-06-04 (×2): 25 ug via INTRAVENOUS
  Filled 2023-06-03 (×2): qty 1

## 2023-06-03 MED ORDER — ONDANSETRON HCL 4 MG PO TABS: 4.0000 mg | ORAL_TABLET | Freq: Four times a day (QID) | ORAL | Status: AC | PRN

## 2023-06-03 MED ORDER — ORAL CARE MOUTH RINSE: 15.0000 mL | OROMUCOSAL | Status: DC | PRN

## 2023-06-03 MED ORDER — AMLODIPINE BESYLATE 10 MG PO TABS
10.0000 mg | ORAL_TABLET | Freq: Every day | ORAL | Status: DC
  Administered 2023-06-04: 10 mg via ORAL
  Filled 2023-06-03: qty 1

## 2023-06-03 MED ORDER — SODIUM CHLORIDE 0.9 % IV BOLUS
1000.0000 mL | Freq: Once | INTRAVENOUS | Status: AC
  Administered 2023-06-03: 1000 mL via INTRAVENOUS

## 2023-06-03 MED ORDER — MORPHINE SULFATE (PF) 2 MG/ML IV SOLN
2.0000 mg | INTRAVENOUS | Status: AC | PRN
  Administered 2023-06-03 (×2): 2 mg via INTRAVENOUS
  Filled 2023-06-03 (×2): qty 1

## 2023-06-03 MED ORDER — ONDANSETRON HCL 4 MG/2ML IJ SOLN
4.0000 mg | Freq: Four times a day (QID) | INTRAMUSCULAR | Status: AC | PRN
  Administered 2023-06-03: 4 mg via INTRAVENOUS
  Filled 2023-06-03: qty 2

## 2023-06-03 MED ORDER — LEVOTHYROXINE SODIUM 88 MCG PO TABS
88.0000 ug | ORAL_TABLET | Freq: Every day | ORAL | Status: DC
  Administered 2023-06-04: 88 ug via ORAL
  Filled 2023-06-03 (×3): qty 1

## 2023-06-03 MED ORDER — SODIUM CHLORIDE 0.9 % IV SOLN: Freq: Once | INTRAVENOUS | Status: AC

## 2023-06-03 MED ORDER — HEPARIN BOLUS VIA INFUSION
4000.0000 [IU] | Freq: Once | INTRAVENOUS | Status: AC
  Administered 2023-06-03: 4000 [IU] via INTRAVENOUS
  Filled 2023-06-03: qty 4000

## 2023-06-03 MED ORDER — ACETAMINOPHEN 650 MG RE SUPP
650.0000 mg | Freq: Four times a day (QID) | RECTAL | Status: AC | PRN
  Administered 2023-06-06: 650 mg via RECTAL
  Filled 2023-06-03: qty 1

## 2023-06-03 MED ORDER — HEPARIN (PORCINE) 25000 UT/250ML-% IV SOLN
1000.0000 [IU]/h | INTRAVENOUS | Status: DC
  Administered 2023-06-03 – 2023-06-04 (×2): 800 [IU]/h via INTRAVENOUS
  Filled 2023-06-03 (×2): qty 250

## 2023-06-03 NOTE — Assessment & Plan Note (Signed)
 Worsening creatinine, currently at 1.5 to.  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  Likely due to rhabdomyolysis -Continue with IV fluid -Monitor renal function -Avoid nephrotoxins

## 2023-06-03 NOTE — H&P (Signed)
 History and Physical   Laurie Hale ZOX:[id number] DOB: Dec 16, 1940 DOA: 06/03/2023  PCP: Rory Collard, MD  Outpatient Specialists: Dr. Dustin Gimenez, urology Patient coming from: Home via EMS  I have personally briefly reviewed patient's old medical records in [facility] EMR.  Chief Concern: Fall at home  HPI: Laurie Hale is an 83 year old female with history of hypertension, hyperlipidemia, chronic back pain, hypothyroid, who presents emergency department for chief concerns of a fall at home.  Patient had been laying on the floor at home for an unknown amount of time.  Per ED nursing documentation, EMS gave patient aspirin  325 mg p.o. one-time dose, sodium chloride  500 mL liter bolus.  Vitals in the ED showed T of 97.6, rr 22, hr 100, blood pressure 113/74, SpO2 95% on 2 L nasal cannula.  Serum sodium is 138, potassium 4.0, chloride 104, bicarb 21, BUN of 33, serum creatinine 1.47, EGFR 35, nonfasting blood glucose 123, WBC 17.8, hemoglobin 13.6, platelets of 291.  CK was elevated at 6330. High sensitive troponin was 3186.  COVID/influenza A/influenza B/RSV PCR were negative.  ED treatment: Fentanyl  50 mcg IV, 2 doses were given, heparin  bolus and gtt. were initiated, sodium chloride  1 L bolus. ---------------------------------------- At bedside, patient is able to tell me her first and last name, age, location, current calendar year.  She reports she does not know how long she was laying on the floor.  She did not know that she was on the floor.  Son, Bambi Lever at bedside states that his brother found her on the floor.  The last time his brother saw her was last evening.  Family and patient does not know how long she was laying on the floor.  She denies known trauma to her person.  She denies chest pain, shortness of breath, dysuria, hematuria, diarrhea, swelling of her lower extremities.  She reports that over the last week, she has been feeling increasingly weak and is  easily tired.  She reports that she takes aspirin  as needed.  She reports she has a history of stent placement, and this was done when she was 83 years old.  She is not taking aspirin  every day nor is she taking another antiplatelet therapy.  She is not on an anticholesterol medication.  She reports that her legs have been hurting for couple of years now especially with walking.  She was told that it was due to her not getting enough exercise.  She denies tobacco use.  Social history: She is at home and one of her sons lives with her.  She denies tobacco, EtOH, recreational drug use.  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  ROS: Constitutional: no weight change, no fever ENT/Mouth: no sore throat, no rhinorrhea Eyes: no eye pain, no vision changes Cardiovascular: no chest pain, no dyspnea,  no edema, no palpitations Respiratory: no cough, no sputum, no wheezing Gastrointestinal: no nausea, no vomiting, no diarrhea, no constipation Genitourinary: no urinary incontinence, no dysuria, no hematuria Musculoskeletal: no arthralgias, no myalgias Skin: no skin lesions, no pruritus, Neuro: + weakness, no loss of consciousness, no syncope Psych: no anxiety, no depression, + decrease appetite Heme/Lymph: no bruising, no bleeding  ED Course: Discussed with EDP, patient requiring hospitalization for chief concerns of NSTEMI with rhabdomyolysis.  Assessment/Plan  Principal Problem:   NSTEMI (non-ST elevated myocardial infarction) (HCC) Active Problems:   AKI (acute kidney injury) (HCC)   Rhabdomyolysis   Bilateral leg pain  Essential hypertension, benign   Hypercholesterolemia   Hypothyroidism   Obesity (BMI 30-39.9)   GERD (gastroesophageal reflux disease)   Depression, recurrent (HCC)   S/P total knee replacement   Iron  deficiency anemia   Assessment and Plan:  * NSTEMI (non-ST elevated myocardial infarction) (HCC) Continue heparin  GGT Complete echo  ordered on admission Cardiology has been consulted by EDP, Dr. Braxton Calico is aware and states that she will see the patient  Bilateral leg pain Especially with walking I suspect patient has peripheral artery disease ABI ordered on admission  Rhabdomyolysis Status post sodium chloride  1.5 L bolus between EMS and EDP Continue with sodium chloride  infusion at 150 mL/h Strict I's and O's Symptomatic support: Morphine  2 mg IV every 4 hours as needed for moderate pain, 20 hours of coverage ordered; morphine  4 mg IV every 4 hours as needed for severe pain, 20 hours of coverage ordered Fentanyl  25 mcg IV every 4 hours as needed for severe pain, that is not relieved with IV morphine  4 mg, 20 hours of coverage ordered Check CK and BMP in the a.m.  AKI (acute kidney injury) (HCC) No baseline CKD Likely multifactorial in setting of rhabdomyolysis and NSTEMI leading to cardiorenal Strict I's and O's Continue fluid hydration at this time Recheck BMP in the a.m.  Obesity (BMI 30-39.9) This complicates overall care and prognosis.   Hypothyroidism Levothyroxine  88 mcg daily resumed on admission  Essential hypertension, benign Home amlodipine  10 mg daily resumed for 06/04/2023 Home lisinopril , hydrochlorothiazide  were not resumed on admission in setting of acute kidney injury.  AM team to resume when the benefits outweigh the risk. Hydralazine  5 mg IV every 6 hours as needed for SBP > 165, 5 days ordered  Depression, recurrent (HCC) Home sertraline  150 mg daily resumed on admission  Chart reviewed.   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, DCIS    Cataract    immature bilateral   Chronic back pain    Constipation    Coronary artery disease    Diverticulosis    GERD (gastroesophageal reflux disease)    takes a med daily-to bring with med name at surgery   History of migraine    hasn't had one in yrs-per pt Zoloft  stopped them   Hyperlipidemia    takes Crestor  and Niacin daily   Hypertension    takes Amlodipine   and Lisinopril  daily   Hypokalemia    takes K Dur tid   Hypothyroidism    takes Synthroid  daily   Insomnia    takes restoril  prn   Joint pain    Joint swelling    Peripheral edema    takes HCTZ daily   Personal history of radiation therapy 2014   F/U lumpectomy    Presence of pessary    Past Surgical History:  Procedure Laterality Date   ABDOMINAL HYSTERECTOMY     BACK SURGERY  2009   BREAST BIOPSY Left 10/22/2012   Stereo - DCIS   BREAST LUMPECTOMY Left 2014   DCIS. F/U radiation    CARDIAC CATHETERIZATION  1999   CATARACT EXTRACTION W/PHACO Right 10/05/2015   Procedure: CATARACT EXTRACTION PHACO AND INTRAOCULAR LENS PLACEMENT (IOC);  Surgeon: Chandra Come Vin-Parikh,  MD;  Location: [facility];  Service: Ophthalmology;  Laterality: Right;  RIGHT   CATARACT EXTRACTION W/PHACO Left 11/16/2015   Procedure: CATARACT EXTRACTION PHACO AND INTRAOCULAR LENS PLACEMENT (IOC);  Surgeon: Billee Buddle, MD;  Location: [facility];  Service: Ophthalmology;  Laterality: Left;  LEFT   COLONOSCOPY     CORONARY ANGIOPLASTY     1 stent   partial coloectomy  1995   right knee arthroscopy  2013   TONSILLECTOMY     TOTAL KNEE ARTHROPLASTY  01/02/2012   RIGHT KNEE   TOTAL KNEE ARTHROPLASTY  01/02/2012   Procedure: TOTAL KNEE ARTHROPLASTY;  Surgeon: Christie Huber Mathers, MD;  Location: [facility];  Service: Orthopedics;  Laterality: Right;   TOTAL KNEE ARTHROPLASTY Left 10/09/2017   Procedure: LEFT TOTAL KNEE ARTHROPLASTY;  Surgeon: Christie Samaa Ueda, MD;  Location: [facility];  Service: Orthopedics;  Laterality: Left;   with block   Social History:  reports that she has never smoked. She has never used smokeless tobacco. She reports that she does not drink alcohol  and does not use drugs.  Allergies  Allergen Reactions   Ciprofloxacin    Penicillins Hives         Family History  Problem Relation Age of Onset   Hypertension Mother    Sudden death Sister        x2   Breast cancer Sister    Heart disease Brother    Family history: Family history reviewed and not pertinent.  Prior to Admission medications   Medication Sig Start Date End Date Taking? Authorizing Provider  amLODipine  (NORVASC ) 10 MG tablet Take 1 tablet (10 mg total) by mouth daily. 03/03/22   Krishnan, Sendil K, MD  hydrochlorothiazide  (HYDRODIURIL ) 25 MG tablet Take by mouth. 03/20/12   [provider]  levothyroxine  (SYNTHROID , LEVOTHROID) 88 MCG tablet Take 88 mcg by mouth at bedtime.    [provider]  lisinopril  (PRINIVIL ,ZESTRIL ) 40 MG tablet Take 1 tablet (40 mg total) by mouth daily. 01/31/14   Dellar Fenton, MD  potassium chloride  (KLOR-CON ) 10 MEQ tablet Take by mouth. 03/20/12   [provider]  rosuvastatin  (CRESTOR ) 20 MG tablet Take 20 mg by mouth every Monday, Wednesday, and Friday.    [provider]  sertraline  (ZOLOFT ) 100 MG tablet Take by mouth. 10/25/22   [provider]   Physical Exam: Vitals:   06/03/23 1200 06/03/23 1230 06/03/23 1253 06/03/23 1405  BP: 122/88 121/89    Pulse: 95 96    Resp: 17 19    Temp:    (!) 97.5 F (36.4 C)  TempSrc:    Oral  SpO2: 95% 95%    Weight:   81.6 kg   Height:   5\' 1"  (1.549 m)    Constitutional: appears age-appropriate, frail, NAD Eyes: PERRL, lids and conjunctivae normal ENMT: Mucous membranes are moist. Posterior pharynx clear of any exudate or lesions. Age-appropriate dentition. Hearing appropriate Neck: normal, supple, no masses, no thyromegaly Respiratory: clear to auscultation bilaterally, no wheezing, no crackles.  Normal respiratory effort. No accessory muscle use.  Cardiovascular: Regular rate and rhythm, no murmurs / rubs / gallops. No extremity edema. 2+ pedal pulses. No carotid bruits.  Abdomen: Obese abdomen, no tenderness, no masses palpated, no hepatosplenomegaly. Bowel sounds positive.  Musculoskeletal: no clubbing / cyanosis. No joint deformity upper and lower extremities. Good ROM, no contractures, no atrophy. Normal muscle tone.  Skin: no rashes, lesions, ulcers. No induration Neurologic: Sensation intact. Strength 5/5 in all 4.  Psychiatric: Normal judgment and insight. Alert and oriented x 3. Normal mood.   EKG: independently reviewed, showing sinus tachycardia with rate of 102, QTc 516  Chest x-ray on Admission: I personally reviewed and I agree with radiologist reading as below.  CT Cervical Spine Wo Contrast Result Date: 06/03/2023 CLINICAL DATA:  Fall with trauma to the head and neck. EXAM: CT CERVICAL SPINE WITHOUT CONTRAST TECHNIQUE: Multidetector CT imaging of the cervical spine was performed without intravenous contrast. Multiplanar CT image reconstructions were also generated. RADIATION DOSE REDUCTION: This exam was performed according to the departmental dose-optimization program which includes automated exposure control, adjustment of the mA and/or kV according to patient size and/or use of iterative reconstruction technique. COMPARISON:  02/25/2022 FINDINGS: Alignment: Chronic degenerative anterolisthesis at C3-4 of 4 mm. Chronic degenerative anterolisthesis at C7-T1 of 4 mm. Skull base and vertebrae: No regional fracture or focal bone lesion. Chronic auto fusion at the C2-3 level. Chronic surgical fusion from C5 through C7. Fusion also at the C 4 5 level could be auto fusion or surgical fusion but without hardware. Soft tissues and spinal canal: No traumatic soft tissue finding. Disc levels:  No stenosis at C2-3 or above. C3-4: Advanced facet arthropathy with 4 mm of degenerative  anterolisthesis. Right foraminal stenosis could affect the right C4 nerve. No compressive stenosis in the region from C4-C7. Advanced facet arthropathy at C7-T1 with 4 mm of degenerative anterolisthesis. Potential for compression or irritation of either C8 nerve. Upper chest: Negative Other: None IMPRESSION: 1. No acute or traumatic finding. Chronic auto fusion at C2-3. Chronic surgical fusion from C5 through C7. Fusion also at the C4-5 level could be auto fusion or surgical fusion but without hardware. 2. Advanced facet arthropathy at C3-4 with 4 mm of degenerative anterolisthesis. Right foraminal stenosis could affect the right C4 nerve. 3. Advanced facet arthropathy at C7-T1 with 4 mm of degenerative anterolisthesis. Potential for compression or irritation of either C8 nerve. Electronically Signed   By: Bettylou Brunner M.D.   On: 06/03/2023 11:38   CT HEAD WO CONTRAST ( ) Result Date: 06/03/2023 CLINICAL DATA:  Fall with trauma to the head EXAM: CT HEAD WITHOUT CONTRAST TECHNIQUE: Contiguous axial images were obtained from the base of the skull through the vertex without intravenous contrast. RADIATION DOSE REDUCTION: This exam was performed according to the departmental dose-optimization program which includes automated exposure control, adjustment of the mA and/or kV according to patient size and/or use of iterative reconstruction technique. COMPARISON:  02/25/2022 FINDINGS: Brain: Age related volume loss. Chronic small-vessel ischemic changes of the cerebral hemispheric white matter. 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 No sign of acute infarction, mass lesion, hemorrhage, hydrocephalus or extra-axial collection. Vascular: There is atherosclerotic calcification of the major vessels at the base of the brain. Skull: Negative Sinuses/Orbits: Clear/normal Other: None IMPRESSION: No acute or traumatic finding. Age related volume loss. Chronic small-vessel ischemic changes of the cerebral hemispheric white  matter. 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 Electronically Signed   By: Bettylou Brunner M.D.   On: 06/03/2023 11:35   DG Pelvis 1-2 Views Result Date: 06/03/2023 CLINICAL DATA:  Recent fall EXAM: PELVIS - 1-2 VIEW COMPARISON:  CT 04/06/2022 FINDINGS: Distant lumbosacral fusion procedure. Surgical clips overlie the central pelvis. No evidence of acute regional fracture. Chronic sacroiliac degenerative changes are noted. IMPRESSION: No acute or traumatic finding. Distant lumbosacral fusion procedure. Chronic sacroiliac degenerative changes. Electronically Signed   By: Bettylou Brunner M.D.   On: 06/03/2023 11:32  DG Chest Portable 1 View Result Date: 06/03/2023 CLINICAL DATA:  Shortness of breath. EXAM: PORTABLE CHEST 1 VIEW COMPARISON:  01/02/2012 FINDINGS: The heart size and mediastinal contours are unremarkable. Low lung volumes with asymmetric elevation of right hemidiaphragm. No significant pleural effusion. Atelectasis noted within the left base. Postoperative changes noted within the lumbar spine and lower cervical vertebra. IMPRESSION: Low lung volumes with left base atelectasis. Electronically Signed   By: Kimberley Penman M.D.   On: 06/03/2023 11:02   Labs on Admission: I have personally reviewed following labs  CBC: Recent Labs  Lab 06/03/23 0909  WBC 17.8*  HGB 13.6  HCT 41.2  MCV 92.0  PLT 291   Basic Metabolic Panel: Recent Labs  Lab 06/03/23 0909  NA 138  K 4.0  CL 104  CO2 21*  GLUCOSE 223*  BUN 33*  CREATININE 1.47*  CALCIUM  9.5   GFR: Estimated Creatinine Clearance: 27.6 mL/min (A) (by C-G formula based on SCr of 1.47 mg/dL (H)).  Coagulation Profile: Recent Labs  Lab 06/03/23 1257  INR 1.2   Cardiac Enzymes: Recent Labs  Lab 06/03/23 0909  CKTOTAL 6,330*   CBG: Recent Labs  Lab 06/03/23 0924  GLUCAP 226*   Urine analysis:    Component Value Date/Time   COLORURINE RED (A) 02/25/2022 0737   APPEARANCEUR Hazy (A) 05/26/2023 0822   LABSPEC  1.030 02/25/2022 0737   PHURINE  02/25/2022 0737    TEST NOT REPORTED DUE TO COLOR INTERFERENCE OF URINE PIGMENT   GLUCOSEU Negative 05/26/2023 0822   GLUCOSEU NEGATIVE 10/17/2013 1532   HGBUR (A) 02/25/2022 0737    TEST NOT REPORTED DUE TO COLOR INTERFERENCE OF URINE PIGMENT   BILIRUBINUR Negative 05/26/2023 0822   KETONESUR (A) 02/25/2022 0737    TEST NOT REPORTED DUE TO COLOR INTERFERENCE OF URINE PIGMENT   PROTEINUR Negative 05/26/2023 0822   PROTEINUR (A) 02/25/2022 0737    TEST NOT REPORTED DUE TO COLOR INTERFERENCE OF URINE PIGMENT   UROBILINOGEN 0.2 10/17/2013 1532   NITRITE Positive (A) 05/26/2023 0822   NITRITE (A) 02/25/2022 0737    TEST NOT REPORTED DUE TO COLOR INTERFERENCE OF URINE PIGMENT   LEUKOCYTESUR 1+ (A) 05/26/2023 0822   LEUKOCYTESUR (A) 02/25/2022 0737    TEST NOT REPORTED DUE TO COLOR INTERFERENCE OF URINE PIGMENT   This document was prepared using Dragon Voice Recognition software and may include unintentional dictation errors.  Dr. Reinhold Carbine [facility]  If 7PM-7AM, please contact overnight-coverage provider If 7AM-7PM, please contact day attending provider [email or web address]  06/03/2023, 4:36 PM

## 2023-06-03 NOTE — Hospital Course (Addendum)
 [name] is an 83 year old female with history of hypertension, hyperlipidemia, chronic back pain, hypothyroid, who presents emergency department for chief concerns of a fall at home.  Patient had been laying on the floor at home for an unknown amount of time. Patient could not answer how she fell, last known well was a night prior. She was complaining of progressive generalized weakness for about a week  Per ED nursing documentation, EMS gave patient aspirin  325 mg p.o. one-time dose, sodium chloride  500 mL liter bolus.  Vitals in the ED showed T of 97.6, rr 22, hr 100, blood pressure 113/74, SpO2 95% on 2 L nasal cannula.  Serum sodium is 138, potassium 4.0, chloride 104, bicarb 21, BUN of 33, serum creatinine 1.47, EGFR 35, nonfasting blood glucose 123, WBC 17.8, hemoglobin 13.6, platelets of 291.  CK was elevated at 6330. High sensitive troponin was 3186.  COVID/influenza A/influenza B/RSV PCR were negative.  ED treatment: Fentanyl  50 mcg IV, 2 doses were given, heparin  bolus and gtt. were initiated, sodium chloride  1 L bolus.  4/20: Vital stable, overnight significant bilateral leg pain and an ankle abrasion noted, imaging of lumbar spine and bilateral lower extremities Shows a mildly displaced fracture of medial malleolus. UA with likely myoglobin, and rare bacteria.  Improving leukocytosis now at 11, bicarb of 20, BUN 35 with slight worsening of creatinine to 1.52-baseline seems to be normal.  Worsening CK and troponin.  Patient seems very restless although complaining more about leg pain. 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  CK-MB was ordered by cardiology

## 2023-06-03 NOTE — Assessment & Plan Note (Signed)
 Patient denies any chest pain and she was more concentrated on leg pain.  Appears very restless. Echocardiogram with new diagnosis of HFrEF with EF less than 20% and global hypokinesis.  Troponin above 10,000 Cardiology is on board-ordered CK-MB as also has concern of rhabdo. - Continue with heparin  infusion -Appreciate cardiology recommendations

## 2023-06-03 NOTE — ED Provider Notes (Addendum)
 [facility] Provider Note    Event Date/Time   First MD Initiated Contact with Patient 06/03/23 [phone]     (approximate)  History   Chief Complaint: Fall (Unwitnessed )  HPI  Laurie Hale is a 83 y.o. female with a past medical history of arthritis, chronic back pain, hypertension, hyperlipidemia, presents to the emergency department from home following a fall.  Patient is an extremely poor historian, patient states EMS said she was on the ground but she does not recall falling, does not know when she fell.  EMS reports patient lives with her son who states the patient was finishing up an antibiotic over the last 5 days for a urinary tract infection.  Patient has been experiencing leg pain bilaterally for the last 3 to 5 days per patient but states a history of the same, but again poor historian unclear how accurate this is, awaiting son's arrival for further history.  Patient's EKG did shows some ST changes, received 325 mg of aspirin .  Patient denies any chest pain or shortness of breath however satting 91% on room air placed on nasal cannula oxygen on arrival.  Here again very poor historian but denies any chest pain or shortness of breath denies any abdominal pain.  Does state pain in both of her legs mostly from her knees down however she states this has been going on for some time but is not able to quantify this.  Patient is unsure how long she was on the ground, does not recall even being on the ground however EMS states the patient had fallen and had to be picked up off the ground but unclear when the patient fell.  Physical Exam   Triage Vital Signs: ED Triage Vitals  Encounter Vitals Group     BP 06/03/23 0915 113/75     Systolic BP Percentile --      Diastolic BP Percentile --      Pulse Rate 06/03/23 0911 (!) 101     Resp 06/03/23 0911 (!) 23     Temp 06/03/23 0911 97.6 F (36.4 C)     Temp Source 06/03/23 0911 Oral     SpO2 06/03/23 0911 94 %      Weight --      Height 06/03/23 0918 5\' 1"  (1.549 m)     Head Circumference --      Peak Flow --      Pain Score 06/03/23 0913 7     Pain Loc --      Pain Education --      Exclude from Growth Chart --     Most recent vital signs: Vitals:   06/03/23 0911 06/03/23 0915  BP:  113/75  Pulse: (!) 101 100  Resp: (!) 23 (!) 22  Temp: 97.6 F (36.4 C) 97.6 F (36.4 C)  SpO2: 94% 95%    General: Awake, no distress.  Very poor historian.  No signs of head trauma.  No C-spine tenderness. CV:  Good peripheral perfusion.  Regular rate and rhythm  Resp:  Normal effort.  Equal breath sounds bilaterally.  Abd:  No distention.  Soft, nontender.  No rebound or guarding. Other:  Patient states mild discomfort to muscular palpation of the legs in the thighs and the calf bilaterally.  Neurovascular intact distally with warm distal extremities 1+ DP pulses.  Patient has mild pain in the right hip with passive range of motion.  Patient states due to significant weakness she has  difficulty lifting either leg off the bed, unclear if this is new or chronic.  Patient also states difficulty moving her arms bilaterally due to weakness again, unclear if this is acute or chronic.    ED Results / Procedures / Treatments   EKG  EKG viewed and interpreted by myself shows sinus tachycardia 102 bpm with a narrow QRS, normal axis, slight QTc prolongation otherwise normal intervals.  Patient does have fairly diffuse ST changes although mild elevation present in V4 and V5.  No reciprocal depressions noted.  RADIOLOGY  I have reviewed interpret the chest x-ray images.  Hypoventilation and possible signs of interstitial edema on exam.   MEDICATIONS ORDERED IN ED: Medications  sodium chloride  0.9 % bolus 1,000 mL (1,000 mLs Intravenous New Bag/Given 06/03/23 1007)  fentaNYL  (SUBLIMAZE ) injection 50 mcg (50 mcg Intravenous Given 06/03/23 1021)     IMPRESSION / MDM / ASSESSMENT AND PLAN / ED COURSE  I reviewed the  triage vital signs and the nursing notes.  Patient's presentation is most consistent with acute presentation with potential threat to life or bodily function.  Patient presents emergency department for unwitnessed fall found down this morning per report by son.  Awaiting son's arrival for further history.  Patient states over the last 3 to 4 days she has been feeling weak but does not recall falling.  Is unclear how long the patient was on the ground for.  Patient complains of pain in her legs but states this has been going on for "some time" unable to quantify this further to ascertain whether this is acute or chronic.  Patient describes a sensation of generalized fatigue and weakness over the last 3 days but has progressively worsened.  Denies any chest pain or shortness of breath.  Differential is quite broad but would include infectious etiology such as urinary tract infection or pneumonia, head injury or ICH we will obtain a CT scan of the head as a precaution.  Given the weakness this could be more indicative of COVID or influenza or possible MI although the patient denies chest pain or shortness of breath, with slight EKG changes we will obtain a troponin.  Given unknown downtime we will obtain a CK to evaluate for rhabdomyolysis which could also be the cause of some of the leg discomfort although no signs of erythema or significant abnormality on exam.  Given the slight right hip pain we will obtain a x-ray of the pelvis given the initial room air saturation of 91% will obtain a chest x-ray.  Patient's labs have resulted showing moderate leukocytosis of 17,000 otherwise reassuring CBC.  Patient's chemistry shows acute renal insufficiency with a creatinine of 1.47 from a baseline around 0.7 with a anion gap of 13 possibly more indicative of dehydration.  Patient's troponin has resulted positive at 3100.  Given 3 days of weakness I highly suspect the patient has suffered unfortunately an MI likely days  ago.  Will repeat a troponin after 2 hours.  Will start the patient on a heparin  infusion once the CT scan of her head has been completed to ensure no head bleed.  Remainder the lab work including CK and urine are pending.  Imaging pending. The remainder of the patient's workup shows no significant findings besides an elevated CK to 6300 consistent with rhabdomyolysis.  Patient receiving IV fluids.  CT scan of the head is negative for acute finding.  CT scan of cervical spine is negative for acute finding.  Will start the patient  on a heparin  infusion.  Patient will be admitted to the hospital service for further workup and treatment.  I spoke to Dr. Lenna Quinton of cardiology, agrees with the plan of care.  Will admit to the hospitalist service for further workup and treatment.  Son is here and I updated him as well.  CRITICAL CARE Performed by: Ruth Cove   Total critical care time: 45 minutes  Critical care time was exclusive of separately billable procedures and treating other patients.  Critical care was necessary to treat or prevent imminent or life-threatening deterioration.  Critical care was time spent personally by me on the following activities: development of treatment plan with patient and/or surrogate as well as nursing, discussions with consultants, evaluation of patient's response to treatment, examination of patient, obtaining history from patient or surrogate, ordering and performing treatments and interventions, ordering and review of laboratory studies, ordering and review of radiographic studies, pulse oximetry and re-evaluation of patient's condition.   FINAL CLINICAL IMPRESSION(S) / ED DIAGNOSES   NSTEMI Rhabdomyolysis Acute kidney injury  Note:  This document was prepared using Dragon voice recognition software and may include unintentional dictation errors.   Ruth Cove, MD 06/03/23 1243    Ruth Cove, MD 06/03/23 [phone]

## 2023-06-03 NOTE — ED Notes (Addendum)
 Attempted to obtain a UA sample. Patient states she is continent and does not have to pee right now. Patient requesting pain medication and denied to go to CT until she receives pain medication. Paduchowski, MD made aware and in room assessing patient.

## 2023-06-03 NOTE — Assessment & Plan Note (Signed)
 Especially with walking I suspect patient has peripheral artery disease, 1+ pedal pulses ABI ordered on admission - Ordered Dilaudid  as morphine  was not helping and patient was very restless due to pain

## 2023-06-03 NOTE — Assessment & Plan Note (Addendum)
 Home sertraline  150 mg daily resumed on admission

## 2023-06-03 NOTE — ED Notes (Addendum)
 Turned patient up to 4L [place] due to low oxygenation.

## 2023-06-03 NOTE — Assessment & Plan Note (Addendum)
 This complicates overall care and prognosis.

## 2023-06-03 NOTE — Assessment & Plan Note (Signed)
 Levothyroxine  88 mcg daily resumed on admission

## 2023-06-03 NOTE — ED Notes (Signed)
 Attempted to call and put patient on tele cardiac monitoring and tele employee advised RN that [facility] ED monitoring was down. Could not place on CCMD at this time.

## 2023-06-03 NOTE — Progress Notes (Signed)
*  PRELIMINARY RESULTS* Echocardiogram 2D Echocardiogram has been performed.  Laurie Hale Laurie Hale 06/03/2023, 4:22 PM

## 2023-06-03 NOTE — Assessment & Plan Note (Addendum)
 Patient was hypotensive this morning requiring a bolus. - Holding home amlodipine  due to hypotension -Keep holding lisinopril  and HCTZ as patient also has AKI -Continue to monitor

## 2023-06-03 NOTE — Consult Note (Signed)
 PHARMACY - ANTICOAGULATION CONSULT NOTE  Pharmacy Consult for heparin  infusion Indication: chest pain/ACS  Allergies  Allergen Reactions   Ciprofloxacin    Penicillins Hives          Patient Measurements: Height: 5\' 1"  (154.9 cm) IBW/kg (Calculated) : 47.8 Heparin  wt: 66.3 kg  Vital Signs: Temp: 97.6 F (36.4 C) (04/19 0915) Temp Source: Oral (04/19 0915) BP: 121/89 (04/19 1230) Pulse Rate: 96 (04/19 1230)  Labs: Recent Labs    06/03/23 0909  HGB 13.6  HCT 41.2  PLT 291  CREATININE 1.47*  CKTOTAL 6,330*  TROPONINIHS 3,186*    CrCl cannot be calculated (Unknown ideal weight.).   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, DCIS   Cataract    immature bilateral   Chronic back pain    Constipation    Coronary artery disease    Diverticulosis    GERD (gastroesophageal reflux disease)    takes a med daily-to bring with med name at surgery   History of migraine    hasn't had one in yrs-per pt Zoloft  stopped them   Hyperlipidemia    takes Crestor  and Niacin daily   Hypertension    takes Amlodipine   and Lisinopril  daily   Hypokalemia    takes K Dur tid   Hypothyroidism    takes Synthroid  daily   Insomnia    takes restoril  prn   Joint pain    Joint swelling    Peripheral edema    takes HCTZ daily   Personal history of radiation therapy 2014   F/U lumpectomy    Presence of pessary     Medications:  No home anticoagulation per pharmacist review  Assessment: 83 yo female presented to ED following an unwitnessed fall.  During evaluation EKG showed ST changes and  patient had elevated troponin.  Pharmacy consulted for initiation of heparin  infusion.  Baseline PT/INR and aPTT ordered    Goal of Therapy:  Heparin  level 0.3-0.7 units/ml Monitor platelets by anticoagulation protocol: Yes   Plan:  Give 4000 units bolus x 1 Start heparin  infusion at 800 units/hr Check anti-Xa level  in 8 hours and daily while on heparin  Continue to monitor H&H and platelets  Ramonita Burow, PharmD 06/03/2023,12:44 PM

## 2023-06-03 NOTE — Consult Note (Signed)
 PHARMACY - ANTICOAGULATION CONSULT NOTE  Pharmacy Consult for heparin  infusion Indication: chest pain/ACS  Allergies  Allergen Reactions   Ciprofloxacin    Penicillins Hives          Patient Measurements: Height: 5\' 1"  (154.9 cm) Weight: 81.6 kg (179 lb 12.8 oz) IBW/kg (Calculated) : 47.8 HEPARIN  DW (KG): 66.3 Heparin  wt: 66.3 kg  Vital Signs: Temp: 97.5 F (36.4 C) (04/19 1405) Temp Source: Oral (04/19 1405) BP: 129/87 (04/19 1936) Pulse Rate: 85 (04/19 1936)  Labs: Recent Labs    06/03/23 0909 06/03/23 1257 06/03/23 2100  HGB 13.6  --   --   HCT 41.2  --   --   PLT 291  --   --   APTT  --  26  --   LABPROT  --  15.1  --   INR  --  1.2  --   HEPARINUNFRC  --   --  0.45  CREATININE 1.47*  --   --   CKTOTAL 6,330*  --   --   TROPONINIHS 3,186*  --   --     Estimated Creatinine Clearance: 27.6 mL/min (A) (by C-G formula based on SCr of 1.47 mg/dL (H)).   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, DCIS   Cataract    immature bilateral   Chronic back pain    Constipation    Coronary artery disease    Diverticulosis    GERD (gastroesophageal reflux disease)    takes a med daily-to bring with med name at surgery   History of migraine    hasn't had one in yrs-per pt Zoloft  stopped them   Hyperlipidemia    takes Crestor  and Niacin daily   Hypertension    takes Amlodipine   and Lisinopril  daily   Hypokalemia    takes K Dur tid   Hypothyroidism    takes Synthroid  daily   Insomnia    takes restoril  prn   Joint pain    Joint swelling    Peripheral edema    takes HCTZ daily   Personal history of radiation therapy 2014   F/U lumpectomy    Presence of pessary     Medications:  No home anticoagulation per pharmacist review  Assessment: 83 yo female presented to ED following an unwitnessed fall.  During evaluation EKG showed ST changes and  patient had elevated troponin.  Pharmacy  consulted for initiation of heparin  infusion.  Baseline PT/INR and aPTT ordered    Goal of Therapy:  Heparin  level 0.3-0.7 units/ml Monitor platelets by anticoagulation protocol: Yes  4/19@2100 : HL 0.45, therapeutic x 1   Plan:  Continue heparin  infusion at 800 units/hr Check confirmatory anti-Xa level in 8 hours Continue to monitor H&H and platelets  Alaric Gladwin A Quintell Bonnin, PharmD Clinical Pharmacist 06/03/2023 9:27 PM

## 2023-06-03 NOTE — Assessment & Plan Note (Signed)
 Status post sodium chloride  1.5 L bolus between EMS and EDP Continue with sodium chloride  infusion at 150 mL/h Strict I's and O's Symptomatic support: Morphine  2 mg IV every 4 hours as needed for moderate pain, 20 hours of coverage ordered; morphine  4 mg IV every 4 hours as needed for severe pain, 20 hours of coverage ordered Fentanyl  25 mcg IV every 4 hours as needed for severe pain, that is not relieved with IV morphine  4 mg, 20 hours of coverage ordered Check CK and BMP in the a.m.

## 2023-06-03 NOTE — ED Triage Notes (Signed)
 Coming from home. Called for fall. unwitt. pt does not know how long she has been on fall, she does not remember falling. Patietn has had more falls in the past few weeks. Treating currently for UTI was on last pill it was rx for 5 days 2x a day. Patietn has been experieincing leg pain bilaterally. Received 325mg  Asprin for ST elevation in 12 lead from EMS. Patietn 91% on RA and EMS placed patietn on 4L [place]. Patient recieved 500mL NS. 16G RAC.

## 2023-06-04 ENCOUNTER — Inpatient Hospital Stay

## 2023-06-04 DIAGNOSIS — E7800 Pure hypercholesterolemia, unspecified: Secondary | ICD-10-CM

## 2023-06-04 DIAGNOSIS — E039 Hypothyroidism, unspecified: Secondary | ICD-10-CM

## 2023-06-04 DIAGNOSIS — I10 Essential (primary) hypertension: Secondary | ICD-10-CM

## 2023-06-04 DIAGNOSIS — M6282 Rhabdomyolysis: Secondary | ICD-10-CM | POA: Diagnosis not present

## 2023-06-04 DIAGNOSIS — E669 Obesity, unspecified: Secondary | ICD-10-CM

## 2023-06-04 DIAGNOSIS — S8251XA Displaced fracture of medial malleolus of right tibia, initial encounter for closed fracture: Secondary | ICD-10-CM

## 2023-06-04 DIAGNOSIS — M79605 Pain in left leg: Secondary | ICD-10-CM

## 2023-06-04 DIAGNOSIS — F339 Major depressive disorder, recurrent, unspecified: Secondary | ICD-10-CM

## 2023-06-04 DIAGNOSIS — I5021 Acute systolic (congestive) heart failure: Secondary | ICD-10-CM

## 2023-06-04 DIAGNOSIS — M79604 Pain in right leg: Secondary | ICD-10-CM

## 2023-06-04 DIAGNOSIS — N179 Acute kidney failure, unspecified: Secondary | ICD-10-CM

## 2023-06-04 DIAGNOSIS — I214 Non-ST elevation (NSTEMI) myocardial infarction: Secondary | ICD-10-CM | POA: Diagnosis not present

## 2023-06-04 DIAGNOSIS — K219 Gastro-esophageal reflux disease without esophagitis: Secondary | ICD-10-CM

## 2023-06-04 LAB — CBC
HCT: 38.2 % (ref 36.0–46.0)
Hemoglobin: 12.7 g/dL (ref 12.0–15.0)
MCH: 30.6 pg (ref 26.0–34.0)
MCHC: 33.2 g/dL (ref 30.0–36.0)
MCV: 92 fL (ref 80.0–100.0)
Platelets: 245 10*3/uL (ref 150–400)
RBC: 4.15 MIL/uL (ref 3.87–5.11)
RDW: 14 % (ref 11.5–15.5)
WBC: 11 10*3/uL — ABNORMAL HIGH (ref 4.0–10.5)
nRBC: 0 % (ref 0.0–0.2)

## 2023-06-04 LAB — ECHOCARDIOGRAM COMPLETE
AR max vel: 0.93 cm2
AV Area VTI: 0.89 cm2
AV Area mean vel: 0.88 cm2
AV Mean grad: 3 mmHg
AV Peak grad: 5.1 mmHg
Ao pk vel: 1.13 m/s
Area-P 1/2: 7.44 cm2
Calc EF: 26 %
Est EF: <20
Height: 61 in
MV VTI: 1.23 cm2
P 1/2 time: 331 ms
S' Lateral: 4.4 cm
Single Plane A2C EF: 31.1 %
Single Plane A4C EF: 23.9 %
Weight: 2876.8 [oz_av]

## 2023-06-04 LAB — BASIC METABOLIC PANEL WITH GFR
Anion gap: 8 (ref 5–15)
BUN: 35 mg/dL — ABNORMAL HIGH (ref 8–23)
CO2: 20 mmol/L — ABNORMAL LOW (ref 22–32)
Calcium: 8.5 mg/dL — ABNORMAL LOW (ref 8.9–10.3)
Chloride: 110 mmol/L (ref 98–111)
Creatinine, Ser: 1.52 mg/dL — ABNORMAL HIGH (ref 0.44–1.00)
GFR, Estimated: 34 mL/min — ABNORMAL LOW (ref >60)
Glucose, Bld: 134 mg/dL — ABNORMAL HIGH (ref 70–99)
Potassium: 4.6 mmol/L (ref 3.5–5.1)
Sodium: 138 mmol/L (ref 135–145)

## 2023-06-04 LAB — URINALYSIS, ROUTINE W REFLEX MICROSCOPIC
Bilirubin Urine: NEGATIVE
Glucose, UA: NEGATIVE mg/dL
Ketones, ur: NEGATIVE mg/dL
Leukocytes,Ua: NEGATIVE
Nitrite: NEGATIVE
Protein, ur: 100 mg/dL — AB
Specific Gravity, Urine: 1.017 (ref 1.005–1.030)
pH: 5 (ref 5.0–8.0)

## 2023-06-04 LAB — CK: Total CK: 36602 U/L — ABNORMAL HIGH (ref 38–234)

## 2023-06-04 LAB — CKMB (ARMC ONLY): CK, MB: 239.8 ng/mL — ABNORMAL HIGH (ref 0.5–5.0)

## 2023-06-04 LAB — TROPONIN I (HIGH SENSITIVITY)
Troponin I (High Sensitivity): 10962 ng/L (ref <18)
Troponin I (High Sensitivity): 10573 ng/L (ref <18)

## 2023-06-04 LAB — HEPARIN LEVEL (UNFRACTIONATED): Heparin Unfractionated: 0.37 [IU]/mL (ref 0.30–0.70)

## 2023-06-04 LAB — BRAIN NATRIURETIC PEPTIDE: B Natriuretic Peptide: 1009.2 pg/mL — ABNORMAL HIGH (ref 0.0–100.0)

## 2023-06-04 MED ORDER — LACTATED RINGERS IV SOLN: INTRAVENOUS | Status: AC

## 2023-06-04 MED ORDER — HYDROMORPHONE HCL 1 MG/ML IJ SOLN
1.0000 mg | INTRAMUSCULAR | Status: DC | PRN
  Administered 2023-06-04: 1 mg via INTRAVENOUS
  Filled 2023-06-04: qty 1

## 2023-06-04 MED ORDER — HYDROCODONE-ACETAMINOPHEN 5-325 MG PO TABS
1.0000 | ORAL_TABLET | Freq: Four times a day (QID) | ORAL | Status: DC
  Administered 2023-06-04 (×3): 1 via ORAL
  Filled 2023-06-04 (×3): qty 1

## 2023-06-04 MED ORDER — POLYETHYLENE GLYCOL 3350 17 G PO PACK
17.0000 g | PACK | Freq: Two times a day (BID) | ORAL | Status: DC
  Administered 2023-06-04: 17 g via ORAL
  Filled 2023-06-04 (×2): qty 1

## 2023-06-04 MED ORDER — LACTATED RINGERS IV BOLUS
500.0000 mL | Freq: Once | INTRAVENOUS | Status: AC
  Administered 2023-06-04: 500 mL via INTRAVENOUS

## 2023-06-04 MED ORDER — HYDROMORPHONE HCL 1 MG/ML IJ SOLN
0.5000 mg | INTRAMUSCULAR | Status: DC | PRN
  Administered 2023-06-05 (×3): 0.5 mg via INTRAVENOUS
  Filled 2023-06-04: qty 1
  Filled 2023-06-04: qty 0.5
  Filled 2023-06-04: qty 1

## 2023-06-04 NOTE — Assessment & Plan Note (Signed)
-   Holding home statin due to rhabdomyolysis

## 2023-06-04 NOTE — Consult Note (Signed)
 PHARMACY - ANTICOAGULATION CONSULT NOTE  Pharmacy Consult for heparin  infusion Indication: chest pain/ACS  Allergies  Allergen Reactions   Ciprofloxacin    Penicillins Hives          Patient Measurements: Height: 5\' 1"  (154.9 cm) Weight: 81.6 kg (179 lb 12.8 oz) IBW/kg (Calculated) : 47.8 HEPARIN  DW (KG): 66.3 Heparin  wt: 66.3 kg  Vital Signs: Temp: 97.8 F (36.6 C) (04/20 0355) Temp Source: Oral (04/20 0355) BP: 101/55 (04/20 0355) Pulse Rate: 76 (04/20 0355)  Labs: Recent Labs    06/03/23 0909 06/03/23 1257 06/03/23 2100 06/04/23 0506  HGB 13.6  --   --  12.7  HCT 41.2  --   --  38.2  PLT 291  --   --  245  APTT  --  26  --   --   LABPROT  --  15.1  --   --   INR  --  1.2  --   --   HEPARINUNFRC  --   --  0.45 0.37  CREATININE 1.47*  --   --   --   CKTOTAL 6,330*  --   --   --   TROPONINIHS 3,186*  --   --   --     Estimated Creatinine Clearance: 27.6 mL/min (A) (by C-G formula based on SCr of 1.47 mg/dL (H)).   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, DCIS   Cataract    immature bilateral   Chronic back pain    Constipation    Coronary artery disease    Diverticulosis    GERD (gastroesophageal reflux disease)    takes a med daily-to bring with med name at surgery   History of migraine    hasn't had one in yrs-per pt Zoloft  stopped them   Hyperlipidemia    takes Crestor  and Niacin daily   Hypertension    takes Amlodipine   and Lisinopril  daily   Hypokalemia    takes K Dur tid   Hypothyroidism    takes Synthroid  daily   Insomnia    takes restoril  prn   Joint pain    Joint swelling    Peripheral edema    takes HCTZ daily   Personal history of radiation therapy 2014   F/U lumpectomy    Presence of pessary     Medications:  No home anticoagulation per pharmacist review  Assessment: 83 yo female presented to ED following an unwitnessed fall.  During evaluation EKG  showed ST changes and  patient had elevated troponin.  Pharmacy consulted for initiation of heparin  infusion.  Baseline PT/INR and aPTT ordered    Goal of Therapy:  Heparin  level 0.3-0.7 units/ml Monitor platelets by anticoagulation protocol: Yes  4/19@2100 : HL 0.45, therapeutic x 1 4/20 0506 HL 0.37, therapeutic x 2   Plan:  Continue heparin  infusion at 800 units/hr Recheck HL daily w/ AM labs while therapeutic Continue to monitor H&H and platelets  Coretta Dexter, PharmD, [facility] 06/04/2023 6:24 AM

## 2023-06-04 NOTE — Assessment & Plan Note (Addendum)
 Likely secondary to fall.  Patient had history of multiple fractures in the past-imaging was done due to her complaint of bilateral pain but right more than left.  Shows mildly displaced fracture of right medial malleolus. - Orthopedic was consulted -Supportive care with pain control

## 2023-06-04 NOTE — Assessment & Plan Note (Signed)
 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  Prior echo was done in October 2020 and it was normal.  BNP elevated at 1009. Clinically appears euvolemic - Management per cardiology - Monitor volume status closely as giving some IV fluid due to hypotension

## 2023-06-04 NOTE — Plan of Care (Signed)

## 2023-06-04 NOTE — Consult Note (Signed)
 ORTHOPAEDIC CONSULTATION  REQUESTING PHYSICIAN: [name], MD  Chief Complaint:   Right lower leg pain.  History of Present Illness: Laurie Hale is an 83 y.o. female with multiple medical problems including coronary artery disease, hypertension, hyperlipidemia, hypothyroidism, chronic back pain and gastroesophageal reflux disease who normally lives with her son and ambulates with a walker.  The patient apparently had several falls over the past week, most recently 2 days ago when she fell trying to get into bed.  She apparently she got wedged between the wall and her bed and was not found until the following morning.  The patient was brought into the emergency room where she was subsequently diagnosed with and admitted for rhabdomyolysis.  Numerous x-rays of her lumbar spine and bilateral lower extremities were obtained to rule out occult injury.  The right ankle films demonstrated evidence of a medial malleolus fracture and so orthopedic consultation was requested.    The patient denies any specific pain in the right ankle at this time, but notes that she was treated nonsurgically a year ago for a bimalleolar right ankle fracture with immobilization in a cast.  Subsequently, the patient has been but ambulate with her walker at home without any ankle pain.  She does note occasional mild swelling in both lower legs, but denies any numbness or paresthesias to her feet.  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, DCIS   Cataract    immature bilateral   Chronic back pain    Constipation    Coronary artery disease    Diverticulosis    GERD (gastroesophageal reflux disease)    takes a med daily-to bring with med name at surgery   History of migraine    hasn't had one in yrs-per pt Zoloft  stopped them   Hyperlipidemia    takes Crestor  and Niacin daily   Hypertension    takes  Amlodipine   and Lisinopril  daily   Hypokalemia    takes K Dur tid   Hypothyroidism    takes Synthroid  daily   Insomnia    takes restoril  prn   Joint pain    Joint swelling    Peripheral edema    takes HCTZ daily   Personal history of radiation therapy 2014   F/U lumpectomy    Presence of pessary    Past Surgical History:  Procedure Laterality Date   ABDOMINAL HYSTERECTOMY     BACK SURGERY  2009   BREAST BIOPSY Left 10/22/2012   Stereo - DCIS   BREAST LUMPECTOMY Left 2014   DCIS. F/U radiation    CARDIAC CATHETERIZATION  1999   CATARACT EXTRACTION W/PHACO Right 10/05/2015   Procedure: CATARACT EXTRACTION PHACO AND INTRAOCULAR LENS PLACEMENT (IOC);  Surgeon: Billee Buddle, MD;  Location: [facility];  Service: Ophthalmology;  Laterality: Right;  RIGHT   CATARACT EXTRACTION W/PHACO Left 11/16/2015   Procedure: CATARACT EXTRACTION PHACO AND INTRAOCULAR LENS PLACEMENT (IOC);  Surgeon: Billee Buddle, MD;  Location: [facility];  Service: Ophthalmology;  Laterality: Left;  LEFT   COLONOSCOPY     CORONARY ANGIOPLASTY     1 stent   partial coloectomy  1995   right knee arthroscopy  2013   TONSILLECTOMY     TOTAL KNEE ARTHROPLASTY  01/02/2012   RIGHT KNEE   TOTAL KNEE ARTHROPLASTY  01/02/2012   Procedure: TOTAL KNEE ARTHROPLASTY;  Surgeon: Christie Cox, MD;  Location: [facility] OR;  Service: Orthopedics;  Laterality: Right;   TOTAL KNEE ARTHROPLASTY Left 10/09/2017   Procedure: LEFT TOTAL KNEE ARTHROPLASTY;  Surgeon: Christie Cox, MD;  Location: [facility];  Service: Orthopedics;  Laterality: Left;  with block   Social History   Socioeconomic History   Marital status: Married    Spouse name: Not on file   Number of children: Not on file   Years of education: Not on file   Highest education level: Not on file  Occupational History   Not on file  Tobacco Use   Smoking status: Never   Smokeless tobacco: Never  Vaping Use   Vaping status: Not on file   Substance and Sexual Activity   Alcohol  use: No   Drug use: No   Sexual activity: Not Currently  Other Topics Concern   Not on file  Social History Narrative   Not on file   Social Drivers of Health   Financial Resource Strain: Patient Declined (10/21/2022)   Received from [facility]   Overall Financial Resource Strain (CARDIA)    Difficulty of Paying Living Expenses: Patient declined  Food Insecurity: No Food Insecurity (06/03/2023)   Hunger Vital Sign    Worried About Running Out of Food in the Last Year: Never true    Ran Out of Food in the Last Year: Never true  Transportation Needs: No Transportation Needs (06/03/2023)   PRAPARE - Administrator, Civil Service (Medical): No    Lack of Transportation (Non-Medical): No  Physical Activity: Not on file  Stress: Not on file  Social Connections: Unknown (06/03/2023)   Social Connection and Isolation Panel [NHANES]    Frequency of Communication with Friends and Family: Patient declined    Frequency of Social Gatherings with Friends and Family: Patient declined    Attends Religious Services: Patient declined    Database administrator or Organizations: Patient declined    Attends Banker Meetings: Patient declined    Marital Status: Widowed   Family History  Problem Relation Age of Onset   Hypertension Mother    Sudden death Sister        x2   Breast cancer Sister    Heart disease Brother    Allergies  Allergen Reactions   Ciprofloxacin    Penicillins Hives         Prior to Admission medications   Medication Sig Start Date End Date Taking? Authorizing Provider  amLODipine  (NORVASC ) 10 MG tablet Take 1 tablet (10 mg total) by mouth daily. 03/03/22  Yes Krishnan, Sendil K, MD  hydrochlorothiazide  (HYDRODIURIL ) 25 MG tablet Take by mouth. 03/20/12  Yes [provider]  levothyroxine  (SYNTHROID , LEVOTHROID) 88 MCG tablet Take 88 mcg by mouth at bedtime.   Yes [provider]  lisinopril  (PRINIVIL ,ZESTRIL ) 40 MG tablet Take 1 tablet (40 mg total) by mouth daily. 01/31/14  Yes Dellar Fenton, MD  rosuvastatin  (CRESTOR ) 20 MG tablet Take 20 mg by mouth every Monday, Wednesday, and Friday.   Yes [provider]  sertraline  (ZOLOFT ) 100 MG tablet Take 150 mg by mouth daily. 10/25/22  Yes [provider]  potassium chloride  (KLOR-CON ) 10 MEQ tablet Take by mouth. Patient not taking: Reported on 06/03/2023 03/20/12   [provider]   US  ARTERIAL ABI (SCREENING LOWER EXTREMITY) Result Date: 06/04/2023 CLINICAL DATA:  96045 Leg pain [id number] Peripheral arterial disease (HCC) 295621 EXAM: NONINVASIVE PHYSIOLOGIC VASCULAR STUDY OF BILATERAL LOWER EXTREMITIES TECHNIQUE: Evaluation of both lower  extremities were performed at rest, including calculation of ankle-brachial indices with single level Doppler, pressure and pulse volume recording. COMPARISON:  None Available. FINDINGS: Right ABI:  1.1 Left ABI:  1.0 Right Lower Extremity:  Normal arterial waveforms at the ankle. Left Lower Extremity:  Normal arterial waveforms at the ankle. 1.0-1.4 Normal IMPRESSION: Normal bilateral resting ankle-brachial indices. Electronically Signed   By: Fernando Hoyer M.D.   On: 06/04/2023 15:02   DG Tibia/Fibula Right Result Date: 06/04/2023 CLINICAL DATA:  Elwin Hammond no fracture. EXAM: RIGHT TIBIA AND FIBULA - 2 VIEW COMPARISON:  Right ankle radiographs 06/04/2023 FINDINGS: Redemonstration of transverse fracture of the superior medial malleolus as seen on right ankle radiographs earlier same day. Status post total right knee arthroplasty. No perihardware lucency is seen. No acute fracture is seen within the more proximal aspect of the tibia or fibula. Redemonstration of likely old healed fracture of the distal fibular diaphysis and metaphysis. IMPRESSION: 1. Redemonstration of transverse fracture of the superior medial malleolus as seen on right ankle radiographs  earlier same day. 2. No acute fracture is seen within the more proximal aspect of the tibia or fibula. Electronically Signed   By: Bertina Broccoli M.D.   On: 06/04/2023 14:27   ECHOCARDIOGRAM COMPLETE Result Date: 06/04/2023    ECHOCARDIOGRAM REPORT   Patient Name:   ALI MCLAURIN Date of Exam: 06/03/2023 Medical Rec #:  [id number]   Height:       61.0 in Accession #:    [id number]  Weight:       179.8 lb Date of Birth:  08-21-1940   BSA:          1.805 m Patient Age:    82 years    BP:           121/89 mmHg Patient Gender: F           HR:           89 bpm. Exam Location:  [facility] Procedure: 2D Echo, Cardiac Doppler, Color Doppler and Strain Analysis (Both            Spectral and Color Flow Doppler were utilized during procedure). Indications:     NSTEMI I21.4  History:         Patient has no prior history of Echocardiogram examinations.                  CAD; Risk Factors:Hypertension.  Sonographer:     Kathaleen Pale Roar Referring Phys:  [id number] AMY N COX Diagnosing Phys: Lanell Pinta Custovic  Sonographer Comments: Global longitudinal strain was attempted. IMPRESSIONS  1. Left ventricular ejection fraction, by estimation, is <20%. The left ventricle has severely decreased function. The left ventricle demonstrates global hypokinesis. The left ventricular internal cavity size was mildly to moderately dilated. Left ventricular diastolic parameters are consistent with Grade I diastolic dysfunction (impaired relaxation).  2. Right ventricular systolic function is normal. The right ventricular size is normal.  3. The mitral valve is normal in structure. Mild mitral valve regurgitation. No evidence of mitral stenosis.  4. Tricuspid valve regurgitation is moderate.  5. The aortic valve is normal in structure. Aortic valve regurgitation is trivial. No aortic stenosis is present.  6. The inferior vena cava is normal in size with greater than 50% respiratory variability, suggesting right atrial pressure of 3 mmHg. FINDINGS  Left Ventricle:  Left ventricular ejection fraction, by estimation, is <20%. The left ventricle has severely decreased function. The left ventricle demonstrates global hypokinesis. The left  ventricular internal cavity size was mildly to moderately dilated. There is no left ventricular hypertrophy. Left ventricular diastolic parameters are consistent with Grade I diastolic dysfunction (impaired relaxation). Right Ventricle: The right ventricular size is normal. No increase in right ventricular wall thickness. Right ventricular systolic function is normal. Left Atrium: Left atrial size was normal in size. Right Atrium: Right atrial size was normal in size. Pericardium: There is no evidence of pericardial effusion. Mitral Valve: The mitral valve is normal in structure. Mild mitral valve regurgitation. No evidence of mitral valve stenosis. MV peak gradient, 2.7 mmHg. The mean mitral valve gradient is 1.0 mmHg. Tricuspid Valve: The tricuspid valve is normal in structure. Tricuspid valve regurgitation is moderate. Aortic Valve: The aortic valve is normal in structure. Aortic valve regurgitation is trivial. Aortic regurgitation PHT measures 331 msec. No aortic stenosis is present. Aortic valve mean gradient measures 3.0 mmHg. Aortic valve peak gradient measures 5.1  mmHg. Aortic valve area, by VTI measures 0.89 cm. Pulmonic Valve: The pulmonic valve was normal in structure. Pulmonic valve regurgitation is not visualized. Aorta: The aortic root is normal in size and structure. Venous: The inferior vena cava is normal in size with greater than 50% respiratory variability, suggesting right atrial pressure of 3 mmHg. IAS/Shunts: No atrial level shunt detected by color flow Doppler.  LEFT VENTRICLE PLAX 2D LVIDd:         5.00 cm     Diastology LVIDs:         4.40 cm     LV e' medial:    3.45 cm/s LV PW:         0.90 cm     LV E/e' medial:  9.6 LV IVS:        1.30 cm     LV e' lateral:   3.75 cm/s LVOT diam:     1.70 cm     LV E/e' lateral: 8.8  LV SV:         15 LV SV Index:   9 LVOT Area:     2.27 cm  LV Volumes (MOD) LV vol d, MOD A2C: 83.5 ml LV vol d, MOD A4C: 91.6 ml LV vol s, MOD A2C: 57.5 ml LV vol s, MOD A4C: 69.7 ml LV SV MOD A2C:     26.0 ml LV SV MOD A4C:     91.6 ml LV SV MOD BP:      22.8 ml RIGHT VENTRICLE RV Basal diam:  2.90 cm RV Mid diam:    2.20 cm RV S prime:     11.90 cm/s TAPSE (M-mode): 1.8 cm LEFT ATRIUM             Index        RIGHT ATRIUM           Index LA diam:        3.20 cm 1.77 cm/m   RA Area:     14.00 cm LA Vol (A2C):   51.1 ml 28.31 ml/m  RA Volume:   36.00 ml  19.94 ml/m LA Vol (A4C):   35.8 ml 19.83 ml/m LA Biplane Vol: 43.9 ml 24.32 ml/m  AORTIC VALVE                    PULMONIC VALVE AV Area (Vmax):    0.93 cm     PV Vmax:          0.75 m/s AV Area (Vmean):   0.88 cm  PV Peak grad:     2.3 mmHg AV Area (VTI):     0.89 cm     PR End Diast Vel: 2.57 msec AV Vmax:           113.00 cm/s  RVOT Peak grad:   1 mmHg AV Vmean:          82.900 cm/s AV VTI:            0.172 m AV Peak Grad:      5.1 mmHg AV Mean Grad:      3.0 mmHg LVOT Vmax:         46.50 cm/s LVOT Vmean:        32.100 cm/s LVOT VTI:          0.068 m LVOT/AV VTI ratio: 0.39 AI PHT:            331 msec  AORTA Ao Root diam: 2.40 cm Ao Asc diam:  3.40 cm MITRAL VALVE               TRICUSPID VALVE MV Area (PHT): 7.44 cm    TR Peak grad:   36.0 mmHg MV Area VTI:   1.23 cm    TR Vmax:        300.00 cm/s MV Peak grad:  2.7 mmHg MV Mean grad:  1.0 mmHg    SHUNTS MV Vmax:       0.82 m/s    Systemic VTI:  0.07 m MV Vmean:      50.0 cm/s   Systemic Diam: 1.70 cm MV Decel Time: 102 msec MV E velocity: 33.00 cm/s MV A velocity: 82.80 cm/s MV E/A ratio:  0.40 MV A Prime:    9.5 cm/s Designer, multimedia signed by Isabell Manzanilla Signature Date/Time: 06/04/2023/11:16:48 AM    Final    DG Lumbar Spine 2-3 Views Result Date: 06/04/2023 CLINICAL DATA:  Fall.  Pain. EXAM: LUMBAR SPINE - 2-3 VIEW COMPARISON:  Lumbar spine radiographs 06/14/2010 FINDINGS:  There is extensive transpedicular rod and screw fusion hardware from the lower thoracic spine through the S1 level. Associated L3-4 and L5-S1 intervertebral disc spacers. Grade 1 anterolisthesis of L3 on L4 is better seen on prior CT. Vertebral body heights appear maintained. Moderate multilevel native disc space narrowing. Within the limitations of low bone mineralization, no definite acute fracture is seen. Surgical clips overlie the pelvis. IMPRESSION: 1. Extensive transpedicular rod and screw fusion hardware from the lower thoracic spine through the S1 level. 2. No definite acute fracture is seen. Electronically Signed   By: Bertina Broccoli M.D.   On: 06/04/2023 09:55   DG Knee 1-2 Views Right Result Date: 06/04/2023 CLINICAL DATA:  Fall. EXAM: RIGHT KNEE - 1-2 VIEW COMPARISON:  Right knee radiographs 03/31/2011 FINDINGS: Interval total right knee arthroplasty. No perihardware lucency is seen to indicate hardware failure or loosening. There is diffuse decreased bone mineralization. Within this limitation, no acute fracture is seen. Possible tiny joint effusion. Mild to moderate atherosclerotic calcifications. IMPRESSION: 1. Interval total right knee arthroplasty without evidence of hardware failure. 2. No acute fracture. Electronically Signed   By: Bertina Broccoli M.D.   On: 06/04/2023 09:53   DG Ankle 2 Views Right Result Date: 06/04/2023 CLINICAL DATA:  Fall.  Pain. EXAM: RIGHT ANKLE - 2 VIEW COMPARISON:  None Available. FINDINGS: There is diffuse decreased bone mineralization. There is a transverse fracture of the superior aspect of the medial malleolus with up to 3 mm lateral displacement of the  distal fracture component with respect to the proximal fracture component. Likely old healed fracture of the distal fibular diaphysis with minimal cortical step-off on lateral view. The ankle mortise is symmetric and intact. IMPRESSION: 1. Acute mildly displaced fracture of the medial malleolus. 2. Likely old  healed fracture of the distal fibular diaphysis. Electronically Signed   By: Bertina Broccoli M.D.   On: 06/04/2023 09:52   DG Ankle 2 Views Left Result Date: 06/04/2023 CLINICAL DATA:  Fall.  Pain. EXAM: LEFT ANKLE - 2 VIEW COMPARISON:  None Available. FINDINGS: There is diffuse decreased bone mineralization. The ankle mortise is symmetric and intact. Mild distal medial and lateral malleolar degenerative spurs. No acute fracture is seen. No dislocation. IMPRESSION: 1. No acute fracture. 2. Mild distal medial and lateral malleolar degenerative spurs. Electronically Signed   By: Bertina Broccoli M.D.   On: 06/04/2023 09:19   DG Knee 1-2 Views Left Result Date: 06/04/2023 CLINICAL DATA:  Fall.  Pain. EXAM: LEFT KNEE - 1-2 VIEW COMPARISON:  None Available. FINDINGS: Status post total left knee arthroplasty. No perihardware lucency is seen to indicate hardware failure or loosening. No joint effusion. No acute fracture is seen. No dislocation. IMPRESSION: Status post total left knee arthroplasty without evidence of hardware failure. Electronically Signed   By: Bertina Broccoli M.D.   On: 06/04/2023 09:04   CT Cervical Spine Wo Contrast Result Date: 06/03/2023 CLINICAL DATA:  Fall with trauma to the head and neck. EXAM: CT CERVICAL SPINE WITHOUT CONTRAST TECHNIQUE: Multidetector CT imaging of the cervical spine was performed without intravenous contrast. Multiplanar CT image reconstructions were also generated. RADIATION DOSE REDUCTION: This exam was performed according to the departmental dose-optimization program which includes automated exposure control, adjustment of the mA and/or kV according to patient size and/or use of iterative reconstruction technique. COMPARISON:  02/25/2022 FINDINGS: Alignment: Chronic degenerative anterolisthesis at C3-4 of 4 mm. Chronic degenerative anterolisthesis at C7-T1 of 4 mm. Skull base and vertebrae: No regional fracture or focal bone lesion. Chronic auto fusion at the C2-3 level.  Chronic surgical fusion from C5 through C7. Fusion also at the C 4 5 level could be auto fusion or surgical fusion but without hardware. Soft tissues and spinal canal: No traumatic soft tissue finding. Disc levels:  No stenosis at C2-3 or above. C3-4: Advanced facet arthropathy with 4 mm of degenerative anterolisthesis. Right foraminal stenosis could affect the right C4 nerve. No compressive stenosis in the region from C4-C7. Advanced facet arthropathy at C7-T1 with 4 mm of degenerative anterolisthesis. Potential for compression or irritation of either C8 nerve. Upper chest: Negative Other: None IMPRESSION: 1. No acute or traumatic finding. Chronic auto fusion at C2-3. Chronic surgical fusion from C5 through C7. Fusion also at the C4-5 level could be auto fusion or surgical fusion but without hardware. 2. Advanced facet arthropathy at C3-4 with 4 mm of degenerative anterolisthesis. Right foraminal stenosis could affect the right C4 nerve. 3. Advanced facet arthropathy at C7-T1 with 4 mm of degenerative anterolisthesis. Potential for compression or irritation of either C8 nerve. Electronically Signed   By: Bettylou Brunner M.D.   On: 06/03/2023 11:38   CT HEAD WO CONTRAST ( ) Result Date: 06/03/2023 CLINICAL DATA:  Fall with trauma to the head EXAM: CT HEAD WITHOUT CONTRAST TECHNIQUE: Contiguous axial images were obtained from the base of the skull through the vertex without intravenous contrast. RADIATION DOSE REDUCTION: This exam was performed according to the departmental dose-optimization program which includes automated exposure control, adjustment  of the mA and/or kV according to patient size and/or use of iterative reconstruction technique. COMPARISON:  02/25/2022 FINDINGS: Brain: Age related volume loss. Chronic small-vessel ischemic changes of the cerebral hemispheric white matter. 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 No sign of acute infarction, mass lesion, hemorrhage, hydrocephalus or extra-axial  collection. Vascular: There is atherosclerotic calcification of the major vessels at the base of the brain. Skull: Negative Sinuses/Orbits: Clear/normal Other: None IMPRESSION: No acute or traumatic finding. Age related volume loss. Chronic small-vessel ischemic changes of the cerebral hemispheric white matter. 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 Electronically Signed   By: Bettylou Brunner M.D.   On: 06/03/2023 11:35   DG Pelvis 1-2 Views Result Date: 06/03/2023 CLINICAL DATA:  Recent fall EXAM: PELVIS - 1-2 VIEW COMPARISON:  CT 04/06/2022 FINDINGS: Distant lumbosacral fusion procedure. Surgical clips overlie the central pelvis. No evidence of acute regional fracture. Chronic sacroiliac degenerative changes are noted. IMPRESSION: No acute or traumatic finding. Distant lumbosacral fusion procedure. Chronic sacroiliac degenerative changes. Electronically Signed   By: Bettylou Brunner M.D.   On: 06/03/2023 11:32   DG Chest Portable 1 View Result Date: 06/03/2023 CLINICAL DATA:  Shortness of breath. EXAM: PORTABLE CHEST 1 VIEW COMPARISON:  01/02/2012 FINDINGS: The heart size and mediastinal contours are unremarkable. Low lung volumes with asymmetric elevation of right hemidiaphragm. No significant pleural effusion. Atelectasis noted within the left base. Postoperative changes noted within the lumbar spine and lower cervical vertebra. IMPRESSION: Low lung volumes with left base atelectasis. Electronically Signed   By: Kimberley Penman M.D.   On: 06/03/2023 11:02    Positive ROS: All other systems have been reviewed and were otherwise negative with the exception of those mentioned in the HPI and as above.  Physical Exam: General:  Alert, no acute distress Psychiatric:  Patient is competent for consent with normal mood and affect   Cardiovascular:  No pedal edema Respiratory:  No wheezing, non-labored breathing GI:  Abdomen is soft and non-tender Skin:  No lesions in the area of chief  complaint Neurologic:  Sensation intact distally Lymphatic:  No axillary or cervical lymphadenopathy  Orthopedic Exam:  Orthopedic examination is limited to the right lower leg and foot.  Skin inspection around of the right ankle is notable for a very superficial abrasion over the lateral aspect of the distal fibula, but otherwise is unremarkable.  No swelling, erythema, ecchymosis, other skin skin abnormalities are identified.  She has no tenderness to palpation of the medial or lateral aspects of the ankle.  She is able to actively dorsiflex and plantarflex her ankle without any discomfort, and is able to circumduct her ankle without pain.  There is no ankle effusion.  She is grossly neurovascularly intact to her right foot.  X-rays:  Recent x-rays of the right ankle, as well as recent right tib-fib films are available for review and have been reviewed by myself.  The findings are as described above.  There is evidence of an old well-healed distal fibular fracture, as well as a transverse medial malleolar fracture.  By my review, the fracture appears to be old.  The mortise is satisfactorily maintained and shows no evidence for significant degenerative changes.  No other acute bony abnormalities are identified.  Assessment: Asymptomatic nonunion of medial malleolar fracture status post prior bimalleolar fracture, right ankle.  Plan: The treatment options have been discussed with the patient and her family members who are at the bedside.  Based on the patient's history and  examination findings as well as her x-rays, I feel that this will malleolar fracture is not acute but rather an old nonunion which appears to be asymptomatic.  Therefore, no further treatment is warranted at this time.  The patient may be mobilized with physical therapy, weightbearing as tolerated on the right leg and foot, and using a walker for balance and support.  Thank you for asking me to participate in the care of this most  pleasant and unfortunate woman.  I will sign off at this time as no further treatment is warranted regarding her right ankle.  However, if there is need further orthopedic input during this hospitalization, please reconsult us .   Lonnie Roberts, MD  Beeper #:  [phone]  06/04/2023 5:19 PM

## 2023-06-04 NOTE — Consult Note (Signed)
 [facility] CLINIC CARDIOLOGY CONSULT NOTE       Patient ID: LUXE CUADROS MRN: [id number] DOB/AGE: Apr 24, 1940 83 y.o.  Admit date: 06/03/2023 Referring Physician Dr Reinhold Carbine Primary Physician Rory Collard, MD Primary Cardiologist Bary Likes (retired) Reason for Consultation NSTEMI/rhabdo  HPI: [name] is an 83 year old female with history of hypertension, hyperlipidemia, chronic back pain, hypothyroid, who presents emergency department for chief concerns of a fall at home.   Patient had been laying on the floor at home for an unknown amount of time coming in with significant rhabdo and troponin elevation. Patient does have history of CAD with stent placement over 50 years ago. She used to follow with Dr Bary Likes but has not been to our clinic in about 5 years now. During the time of my exam, patient is asleep and snoring. She is arousable but continues to sleep. Given her significant rhabdo, we will hol doff on plans for cardiac cath until later this week so her body can recover.   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, DCIS   Cataract    immature bilateral   Chronic back pain    Constipation    Coronary artery disease    Diverticulosis    GERD (gastroesophageal reflux disease)    takes a med daily-to bring with med name at surgery   History of migraine    hasn't had one in yrs-per pt Zoloft  stopped them   Hyperlipidemia    takes Crestor  and Niacin daily   Hypertension    takes Amlodipine   and Lisinopril  daily   Hypokalemia    takes K Dur tid   Hypothyroidism    takes Synthroid  daily   Insomnia    takes restoril  prn   Joint pain    Joint swelling    Peripheral edema    takes HCTZ daily   Personal history of radiation therapy 2014   F/U lumpectomy    Presence of pessary     Past Surgical History:  Procedure Laterality Date    ABDOMINAL HYSTERECTOMY     BACK SURGERY  2009   BREAST BIOPSY Left 10/22/2012   Stereo - DCIS   BREAST LUMPECTOMY Left 2014   DCIS. F/U radiation    CARDIAC CATHETERIZATION  1999   CATARACT EXTRACTION W/PHACO Right 10/05/2015   Procedure: CATARACT EXTRACTION PHACO AND INTRAOCULAR LENS PLACEMENT (IOC);  Surgeon: Billee Buddle, MD;  Location: [facility];  Service: Ophthalmology;  Laterality: Right;  RIGHT   CATARACT EXTRACTION W/PHACO Left 11/16/2015   Procedure: CATARACT EXTRACTION PHACO AND INTRAOCULAR LENS PLACEMENT (IOC);  Surgeon: Billee Buddle, MD;  Location: [facility];  Service: Ophthalmology;  Laterality: Left;  LEFT   COLONOSCOPY     CORONARY ANGIOPLASTY     1 stent   partial coloectomy  1995   right knee arthroscopy  2013   TONSILLECTOMY     TOTAL KNEE ARTHROPLASTY  01/02/2012   RIGHT KNEE   TOTAL KNEE ARTHROPLASTY  01/02/2012   Procedure: TOTAL KNEE ARTHROPLASTY;  Surgeon: Christie Cox, MD;  Location: [facility] OR;  Service: Orthopedics;  Laterality: Right;   TOTAL KNEE ARTHROPLASTY Left 10/09/2017   Procedure: LEFT TOTAL KNEE ARTHROPLASTY;  Surgeon: Christie Cox, MD;  Location: [facility];  Service: Orthopedics;  Laterality: Left;  with block    Medications Prior to Admission  Medication Sig Dispense Refill Last Dose/Taking   amLODipine  (NORVASC ) 10 MG tablet Take 1 tablet (10 mg total) by mouth daily. 30 tablet 1 06/02/2023   hydrochlorothiazide  (HYDRODIURIL ) 25 MG tablet Take by mouth.   06/02/2023   levothyroxine  (SYNTHROID , LEVOTHROID) 88 MCG tablet Take 88 mcg by mouth at bedtime.   06/02/2023   lisinopril  (PRINIVIL ,ZESTRIL ) 40 MG tablet Take 1 tablet (40 mg total) by mouth daily. 30 tablet 0 06/02/2023   rosuvastatin  (CRESTOR ) 20 MG tablet Take 20 mg by mouth every Monday, Wednesday, and Friday.   06/02/2023   sertraline  (ZOLOFT ) 100 MG tablet Take 150 mg by mouth daily.   06/02/2023   potassium chloride  (KLOR-CON ) 10 MEQ tablet Take by mouth.  (Patient not taking: Reported on 06/03/2023)   Not Taking   Social History   Socioeconomic History   Marital status: Married    Spouse name: Not on file   Number of children: Not on file   Years of education: Not on file   Highest education level: Not on file  Occupational History   Not on file  Tobacco Use   Smoking status: Never   Smokeless tobacco: Never  Vaping Use   Vaping status: Not on file  Substance and Sexual Activity   Alcohol  use: No   Drug use: No   Sexual activity: Not Currently  Other Topics Concern   Not on file  Social History Narrative   Not on file   Social Drivers of Health   Financial Resource Strain: Patient Declined (10/21/2022)   Received from [facility]   Overall Financial Resource Strain (CARDIA)    Difficulty of Paying Living Expenses: Patient declined  Food Insecurity: No Food Insecurity (06/03/2023)   Hunger Vital Sign    Worried About Running Out of Food in the Last Year: Never true    Ran Out of Food in the Last Year: Never true  Transportation Needs: No Transportation Needs (06/03/2023)   PRAPARE - Administrator, Civil Service (Medical): No    Lack of Transportation (Non-Medical): No  Physical Activity: Not on file  Stress: Not on file  Social Connections: Unknown (06/03/2023)   Social Connection and Isolation Panel [NHANES]    Frequency of Communication with Friends and Family: Patient declined    Frequency of Social Gatherings with Friends and Family: Patient declined    Attends Religious Services: Patient declined    Database administrator or Organizations: Patient declined    Attends Banker Meetings: Patient declined    Marital Status: Widowed  Intimate Partner Violence: Not At Risk (06/03/2023)   Humiliation, Afraid, Rape, and Kick questionnaire    Fear of Current or Ex-Partner: No    Emotionally Abused: No    Physically Abused: No    Sexually Abused: No    Family History  Problem  Relation Age of Onset   Hypertension Mother    Sudden death Sister        x2   Breast cancer Sister    Heart disease Brother      Vitals:   06/03/23 2350 06/04/23 0355 06/04/23 0839 06/04/23 1129  BP: 107/73 (!) 101/55 (!) 98/57 (!) 77/57  Pulse: 78 76 77 69  Resp: 18 20 19    Temp:  97.8 F (36.6 C) 97.8 F (36.6 C) 98.5 F (36.9 C)  TempSrc:  Oral Oral Oral  SpO2: 96% 95% 98% 93%  Weight:      Height:  PHYSICAL EXAM General: sleeping, well nourished, in no acute distress. HEENT: Normocephalic and atraumatic. Neck: No JVD.  Lungs: Normal respiratory effort. Clear bilaterally to auscultation. No wheezes, crackles, rhonchi.  Heart: HRRR. Normal S1 and S2 without gallops or murmurs.  Abdomen: Non-distended appearing.  Msk: Normal strength and tone for age. Extremities: Warm and well perfused. No clubbing, cyanosis. no edema.  Neuro: Alert and oriented X 3. Psych: Answers questions appropriately.   Labs: Basic Metabolic Panel: Recent Labs    06/03/23 0909 06/04/23 0506  NA 138 138  K 4.0 4.6  CL 104 110  CO2 21* 20*  GLUCOSE 223* 134*  BUN 33* 35*  CREATININE 1.47* 1.52*  CALCIUM  9.5 8.5*   Liver Function Tests: No results for input(s): "AST", "ALT", "ALKPHOS", "BILITOT", "PROT", "ALBUMIN" in the last 72 hours. No results for input(s): "LIPASE", "AMYLASE" in the last 72 hours. CBC: Recent Labs    06/03/23 0909 06/04/23 0506  WBC 17.8* 11.0*  HGB 13.6 12.7  HCT 41.2 38.2  MCV 92.0 92.0  PLT 291 245   Cardiac Enzymes: Recent Labs    06/03/23 0909 06/04/23 0506 06/04/23 0901 06/04/23 1026  CKTOTAL 6,330* 36,602*  --   --   TROPONINIHS 3,186*  --  10,962* 10,573*   BNP: Recent Labs    06/04/23 0901  BNP 1,009.2*   D-Dimer: No results for input(s): "DDIMER" in the last 72 hours. Hemoglobin A1C: No results for input(s): "HGBA1C" in the last 72 hours. Fasting Lipid Panel: No results for input(s): "CHOL", "HDL", "LDLCALC", "TRIG",  "CHOLHDL", "LDLDIRECT" in the last 72 hours. Thyroid  Function Tests: No results for input(s): "TSH", "T4TOTAL", "T3FREE", "THYROIDAB" in the last 72 hours.  Invalid input(s): "FREET3" Anemia Panel: No results for input(s): "VITAMINB12", "FOLATE", "FERRITIN", "TIBC", "IRON ", "RETICCTPCT" in the last 72 hours.   Radiology: ECHOCARDIOGRAM COMPLETE Result Date: 06/04/2023    ECHOCARDIOGRAM REPORT   Patient Name:   KAYLIA WINBORNE Date of Exam: 06/03/2023 Medical Rec #:  [id number]   Height:       61.0 in Accession #:    [id number]  Weight:       179.8 lb Date of Birth:  1941/01/07   BSA:          1.805 m Patient Age:    82 years    BP:           121/89 mmHg Patient Gender: F           HR:           89 bpm. Exam Location:  [facility] Procedure: 2D Echo, Cardiac Doppler, Color Doppler and Strain Analysis (Both            Spectral and Color Flow Doppler were utilized during procedure). Indications:     NSTEMI I21.4  History:         Patient has no prior history of Echocardiogram examinations.                  CAD; Risk Factors:Hypertension.  Sonographer:     Kathaleen Pale Roar Referring Phys:  [id number] AMY N COX Diagnosing Phys: Lanell Pinta Karla Pavone  Sonographer Comments: Global longitudinal strain was attempted. IMPRESSIONS  1. Left ventricular ejection fraction, by estimation, is <20%. The left ventricle has severely decreased function. The left ventricle demonstrates global hypokinesis. The left ventricular internal cavity size was mildly to moderately dilated. Left ventricular diastolic parameters are consistent with Grade I diastolic dysfunction (impaired relaxation).  2. Right ventricular systolic function is normal.  The right ventricular size is normal.  3. The mitral valve is normal in structure. Mild mitral valve regurgitation. No evidence of mitral stenosis.  4. Tricuspid valve regurgitation is moderate.  5. The aortic valve is normal in structure. Aortic valve regurgitation is trivial. No aortic stenosis is present.  6. The  inferior vena cava is normal in size with greater than 50% respiratory variability, suggesting right atrial pressure of 3 mmHg. FINDINGS  Left Ventricle: Left ventricular ejection fraction, by estimation, is <20%. The left ventricle has severely decreased function. The left ventricle demonstrates global hypokinesis. The left ventricular internal cavity size was mildly to moderately dilated. There is no left ventricular hypertrophy. Left ventricular diastolic parameters are consistent with Grade I diastolic dysfunction (impaired relaxation). Right Ventricle: The right ventricular size is normal. No increase in right ventricular wall thickness. Right ventricular systolic function is normal. Left Atrium: Left atrial size was normal in size. Right Atrium: Right atrial size was normal in size. Pericardium: There is no evidence of pericardial effusion. Mitral Valve: The mitral valve is normal in structure. Mild mitral valve regurgitation. No evidence of mitral valve stenosis. MV peak gradient, 2.7 mmHg. The mean mitral valve gradient is 1.0 mmHg. Tricuspid Valve: The tricuspid valve is normal in structure. Tricuspid valve regurgitation is moderate. Aortic Valve: The aortic valve is normal in structure. Aortic valve regurgitation is trivial. Aortic regurgitation PHT measures 331 msec. No aortic stenosis is present. Aortic valve mean gradient measures 3.0 mmHg. Aortic valve peak gradient measures 5.1  mmHg. Aortic valve area, by VTI measures 0.89 cm. Pulmonic Valve: The pulmonic valve was normal in structure. Pulmonic valve regurgitation is not visualized. Aorta: The aortic root is normal in size and structure. Venous: The inferior vena cava is normal in size with greater than 50% respiratory variability, suggesting right atrial pressure of 3 mmHg. IAS/Shunts: No atrial level shunt detected by color flow Doppler.  LEFT VENTRICLE PLAX 2D LVIDd:         5.00 cm     Diastology LVIDs:         4.40 cm     LV e' medial:    3.45  cm/s LV PW:         0.90 cm     LV E/e' medial:  9.6 LV IVS:        1.30 cm     LV e' lateral:   3.75 cm/s LVOT diam:     1.70 cm     LV E/e' lateral: 8.8 LV SV:         15 LV SV Index:   9 LVOT Area:     2.27 cm  LV Volumes (MOD) LV vol d, MOD A2C: 83.5 ml LV vol d, MOD A4C: 91.6 ml LV vol s, MOD A2C: 57.5 ml LV vol s, MOD A4C: 69.7 ml LV SV MOD A2C:     26.0 ml LV SV MOD A4C:     91.6 ml LV SV MOD BP:      22.8 ml RIGHT VENTRICLE RV Basal diam:  2.90 cm RV Mid diam:    2.20 cm RV S prime:     11.90 cm/s TAPSE (M-mode): 1.8 cm LEFT ATRIUM             Index        RIGHT ATRIUM           Index LA diam:        3.20 cm 1.77 cm/m   RA Area:  14.00 cm LA Vol (A2C):   51.1 ml 28.31 ml/m  RA Volume:   36.00 ml  19.94 ml/m LA Vol (A4C):   35.8 ml 19.83 ml/m LA Biplane Vol: 43.9 ml 24.32 ml/m  AORTIC VALVE                    PULMONIC VALVE AV Area (Vmax):    0.93 cm     PV Vmax:          0.75 m/s AV Area (Vmean):   0.88 cm     PV Peak grad:     2.3 mmHg AV Area (VTI):     0.89 cm     PR End Diast Vel: 2.57 msec AV Vmax:           113.00 cm/s  RVOT Peak grad:   1 mmHg AV Vmean:          82.900 cm/s AV VTI:            0.172 m AV Peak Grad:      5.1 mmHg AV Mean Grad:      3.0 mmHg LVOT Vmax:         46.50 cm/s LVOT Vmean:        32.100 cm/s LVOT VTI:          0.068 m LVOT/AV VTI ratio: 0.39 AI PHT:            331 msec  AORTA Ao Root diam: 2.40 cm Ao Asc diam:  3.40 cm MITRAL VALVE               TRICUSPID VALVE MV Area (PHT): 7.44 cm    TR Peak grad:   36.0 mmHg MV Area VTI:   1.23 cm    TR Vmax:        300.00 cm/s MV Peak grad:  2.7 mmHg MV Mean grad:  1.0 mmHg    SHUNTS MV Vmax:       0.82 m/s    Systemic VTI:  0.07 m MV Vmean:      50.0 cm/s   Systemic Diam: 1.70 cm MV Decel Time: 102 msec MV E velocity: 33.00 cm/s MV A velocity: 82.80 cm/s MV E/A ratio:  0.40 MV A Prime:    9.5 cm/s Designer, multimedia signed by Isabell Manzanilla Signature Date/Time: 06/04/2023/11:16:48 AM    Final    DG Lumbar  Spine 2-3 Views Result Date: 06/04/2023 CLINICAL DATA:  Fall.  Pain. EXAM: LUMBAR SPINE - 2-3 VIEW COMPARISON:  Lumbar spine radiographs 06/14/2010 FINDINGS: There is extensive transpedicular rod and screw fusion hardware from the lower thoracic spine through the S1 level. Associated L3-4 and L5-S1 intervertebral disc spacers. Grade 1 anterolisthesis of L3 on L4 is better seen on prior CT. Vertebral body heights appear maintained. Moderate multilevel native disc space narrowing. Within the limitations of low bone mineralization, no definite acute fracture is seen. Surgical clips overlie the pelvis. IMPRESSION: 1. Extensive transpedicular rod and screw fusion hardware from the lower thoracic spine through the S1 level. 2. No definite acute fracture is seen. Electronically Signed   By: Bertina Broccoli M.D.   On: 06/04/2023 09:55   DG Knee 1-2 Views Right Result Date: 06/04/2023 CLINICAL DATA:  Fall. EXAM: RIGHT KNEE - 1-2 VIEW COMPARISON:  Right knee radiographs 03/31/2011 FINDINGS: Interval total right knee arthroplasty. No perihardware lucency is seen to indicate hardware failure or loosening. There is diffuse decreased bone mineralization. Within this limitation, no acute  fracture is seen. Possible tiny joint effusion. Mild to moderate atherosclerotic calcifications. IMPRESSION: 1. Interval total right knee arthroplasty without evidence of hardware failure. 2. No acute fracture. Electronically Signed   By: Bertina Broccoli M.D.   On: 06/04/2023 09:53   DG Ankle 2 Views Right Result Date: 06/04/2023 CLINICAL DATA:  Fall.  Pain. EXAM: RIGHT ANKLE - 2 VIEW COMPARISON:  None Available. FINDINGS: There is diffuse decreased bone mineralization. There is a transverse fracture of the superior aspect of the medial malleolus with up to 3 mm lateral displacement of the distal fracture component with respect to the proximal fracture component. Likely old healed fracture of the distal fibular diaphysis with minimal cortical  step-off on lateral view. The ankle mortise is symmetric and intact. IMPRESSION: 1. Acute mildly displaced fracture of the medial malleolus. 2. Likely old healed fracture of the distal fibular diaphysis. Electronically Signed   By: Bertina Broccoli M.D.   On: 06/04/2023 09:52   DG Ankle 2 Views Left Result Date: 06/04/2023 CLINICAL DATA:  Fall.  Pain. EXAM: LEFT ANKLE - 2 VIEW COMPARISON:  None Available. FINDINGS: There is diffuse decreased bone mineralization. The ankle mortise is symmetric and intact. Mild distal medial and lateral malleolar degenerative spurs. No acute fracture is seen. No dislocation. IMPRESSION: 1. No acute fracture. 2. Mild distal medial and lateral malleolar degenerative spurs. Electronically Signed   By: Bertina Broccoli M.D.   On: 06/04/2023 09:19   DG Knee 1-2 Views Left Result Date: 06/04/2023 CLINICAL DATA:  Fall.  Pain. EXAM: LEFT KNEE - 1-2 VIEW COMPARISON:  None Available. FINDINGS: Status post total left knee arthroplasty. No perihardware lucency is seen to indicate hardware failure or loosening. No joint effusion. No acute fracture is seen. No dislocation. IMPRESSION: Status post total left knee arthroplasty without evidence of hardware failure. Electronically Signed   By: Bertina Broccoli M.D.   On: 06/04/2023 09:04   CT Cervical Spine Wo Contrast Result Date: 06/03/2023 CLINICAL DATA:  Fall with trauma to the head and neck. EXAM: CT CERVICAL SPINE WITHOUT CONTRAST TECHNIQUE: Multidetector CT imaging of the cervical spine was performed without intravenous contrast. Multiplanar CT image reconstructions were also generated. RADIATION DOSE REDUCTION: This exam was performed according to the departmental dose-optimization program which includes automated exposure control, adjustment of the mA and/or kV according to patient size and/or use of iterative reconstruction technique. COMPARISON:  02/25/2022 FINDINGS: Alignment: Chronic degenerative anterolisthesis at C3-4 of 4 mm. Chronic  degenerative anterolisthesis at C7-T1 of 4 mm. Skull base and vertebrae: No regional fracture or focal bone lesion. Chronic auto fusion at the C2-3 level. Chronic surgical fusion from C5 through C7. Fusion also at the C 4 5 level could be auto fusion or surgical fusion but without hardware. Soft tissues and spinal canal: No traumatic soft tissue finding. Disc levels:  No stenosis at C2-3 or above. C3-4: Advanced facet arthropathy with 4 mm of degenerative anterolisthesis. Right foraminal stenosis could affect the right C4 nerve. No compressive stenosis in the region from C4-C7. Advanced facet arthropathy at C7-T1 with 4 mm of degenerative anterolisthesis. Potential for compression or irritation of either C8 nerve. Upper chest: Negative Other: None IMPRESSION: 1. No acute or traumatic finding. Chronic auto fusion at C2-3. Chronic surgical fusion from C5 through C7. Fusion also at the C4-5 level could be auto fusion or surgical fusion but without hardware. 2. Advanced facet arthropathy at C3-4 with 4 mm of degenerative anterolisthesis. Right foraminal stenosis could affect the right C4  nerve. 3. Advanced facet arthropathy at C7-T1 with 4 mm of degenerative anterolisthesis. Potential for compression or irritation of either C8 nerve. Electronically Signed   By: Bettylou Brunner M.D.   On: 06/03/2023 11:38   CT HEAD WO CONTRAST ( ) Result Date: 06/03/2023 CLINICAL DATA:  Fall with trauma to the head EXAM: CT HEAD WITHOUT CONTRAST TECHNIQUE: Contiguous axial images were obtained from the base of the skull through the vertex without intravenous contrast. RADIATION DOSE REDUCTION: This exam was performed according to the departmental dose-optimization program which includes automated exposure control, adjustment of the mA and/or kV according to patient size and/or use of iterative reconstruction technique. COMPARISON:  02/25/2022 FINDINGS: Brain: Age related volume loss. Chronic small-vessel ischemic changes of the  cerebral hemispheric white matter. 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 No sign of acute infarction, mass lesion, hemorrhage, hydrocephalus or extra-axial collection. Vascular: There is atherosclerotic calcification of the major vessels at the base of the brain. Skull: Negative Sinuses/Orbits: Clear/normal Other: None IMPRESSION: No acute or traumatic finding. Age related volume loss. Chronic small-vessel ischemic changes of the cerebral hemispheric white matter. 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 Electronically Signed   By: Bettylou Brunner M.D.   On: 06/03/2023 11:35   DG Pelvis 1-2 Views Result Date: 06/03/2023 CLINICAL DATA:  Recent fall EXAM: PELVIS - 1-2 VIEW COMPARISON:  CT 04/06/2022 FINDINGS: Distant lumbosacral fusion procedure. Surgical clips overlie the central pelvis. No evidence of acute regional fracture. Chronic sacroiliac degenerative changes are noted. IMPRESSION: No acute or traumatic finding. Distant lumbosacral fusion procedure. Chronic sacroiliac degenerative changes. Electronically Signed   By: Bettylou Brunner M.D.   On: 06/03/2023 11:32   DG Chest Portable 1 View Result Date: 06/03/2023 CLINICAL DATA:  Shortness of breath. EXAM: PORTABLE CHEST 1 VIEW COMPARISON:  01/02/2012 FINDINGS: The heart size and mediastinal contours are unremarkable. Low lung volumes with asymmetric elevation of right hemidiaphragm. No significant pleural effusion. Atelectasis noted within the left base. Postoperative changes noted within the lumbar spine and lower cervical vertebra. IMPRESSION: Low lung volumes with left base atelectasis. Electronically Signed   By: Kimberley Penman M.D.   On: 06/03/2023 11:02    ECHO LVEF <20%  TELEMETRY reviewed by me  06/04/2023 : NSR  EKG reviewed by me: Sinus tachycardia with rate of 102 bpm, prolonged QTc at 516   Data reviewed by me  06/04/2023: last 24h vitals tele labs imaging I/O provider notes  Principal Problem:   NSTEMI (non-ST elevated myocardial  infarction) [facility]) Active Problems:   Essential hypertension, benign   Hypercholesterolemia   Hypothyroidism   GERD (gastroesophageal reflux disease)   S/P total knee replacement   Depression, recurrent (HCC)   AKI (acute kidney injury) (HCC)   Obesity (BMI 30-39.9)   Iron  deficiency anemia   Rhabdomyolysis   Bilateral leg pain    ASSESSMENT AND PLAN:   Rhabdomyolysis NSTEMI Type I vs Type II AKI Continue with IVF hydration Patient is not in CHF and can be hydrated liberally despite reduced LVEF. CKMB ordered to assess for myocardial injury vs pure rhabdo Troponins are flat at this point. 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 Will hold off on plan for cardiac cath until her kidneys and rhabdo improve, likely will set up for later this week. Maintain on telemetry. Optimize lytes, K>4 and Mag>2. Continue heparin  gtt. Ok to start ASA and Toprol is BP/HR allow. Would hold off on ACE-I/ARB and statin until kidneys/rhabdo improve. Patient will need Jardiance or  Farxiga on d/c. Will need to be d/c with Life Vest.  Hypertension Continue amlodipine . Add Toprol once BP improves No nephrotoxins at this time.  Hyperlipidemia Hold statin until rhabdo improves   Signed: Mallisa Alameda, DO 06/04/2023, 12:20 PM [facility]

## 2023-06-04 NOTE — Progress Notes (Signed)
   CROSS COVER NOTE  Patient name: Laurie Hale MRN: [id number] DOB : 02-Jun-1940  Concern as stated by nurse / staff   Concerns for significant pain: "Both legs, mostly below the knees. She is abraded on the right ankle and knee but when we reposition her she cries from pain in both legs."   Review of Pertinent findings: She was found down on ground after prolonged time. Significant LE pain and lower back pain with abrasions to her ankle. Only pelvis xray on presentation which showed no acute fracture but has lumbosacral fusion.   Assessment and  Interventions   Assessment: pain out of proportion and intractable to PRN pain medications and severely limiting ROM/unable to stand or toelrate laying on bed pan. Concern for further acute injury from fall not seen on previous imaging.   Plan: Further Xrays of lumbar/LEs  Analgesia PRN and scheduled for now. Include bowel regimen

## 2023-06-04 NOTE — Progress Notes (Signed)
 Progress Note   Patient: Laurie Hale EXB:[id number] DOB: [birth year] DOA: 06/03/2023     1 DOS: the patient was seen and examined on 06/04/2023   Brief hospital course: [name] is an 83 year old female with history of hypertension, hyperlipidemia, chronic back pain, hypothyroid, who presents emergency department for chief concerns of a fall at home.  Patient had been laying on the floor at home for an unknown amount of time. Patient could not answer how she fell, last known well was a night prior. She was complaining of progressive generalized weakness for about a week  Per ED nursing documentation, EMS gave patient aspirin  325 mg p.o. one-time dose, sodium chloride  500 mL liter bolus.  Vitals in the ED showed T of 97.6, rr 22, hr 100, blood pressure 113/74, SpO2 95% on 2 L nasal cannula.  Serum sodium is 138, potassium 4.0, chloride 104, bicarb 21, BUN of 33, serum creatinine 1.47, EGFR 35, nonfasting blood glucose 123, WBC 17.8, hemoglobin 13.6, platelets of 291.  CK was elevated at 6330. High sensitive troponin was 3186.  COVID/influenza A/influenza B/RSV PCR were negative.  ED treatment: Fentanyl  50 mcg IV, 2 doses were given, heparin  bolus and gtt. were initiated, sodium chloride  1 L bolus.  4/20: Vital stable, overnight significant bilateral leg pain and an ankle abrasion noted, imaging of lumbar spine and bilateral lower extremities Shows a mildly displaced fracture of medial malleolus. UA with likely myoglobin, and rare bacteria.  Improving leukocytosis now at 11, bicarb of 20, BUN 35 with slight worsening of creatinine to 1.52-baseline seems to be normal.  Worsening CK and troponin.  Patient seems very restless although complaining more about leg pain. 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  CK-MB was ordered by cardiology  Assessment and Plan: * NSTEMI (non-ST elevated myocardial infarction) [facility]) Patient denies  any chest pain and she was more concentrated on leg pain.  Appears very restless. Echocardiogram with new diagnosis of HFrEF with EF less than 20% and global hypokinesis.  Troponin above 10,000 Cardiology is on board-ordered CK-MB as also has concern of rhabdo. 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  Prior echo was done in October 2020 and it was normal.  BNP elevated at 1009. Clinically appears euvolemic - Management per cardiology - Monitor volume status closely as giving some IV fluid due to hypotension   Rhabdomyolysis Increasing creatinine and CK levels, it was 36,602 this morning. Patient was found on ground-unknown time.  And she could not recall any thing related to fall and how long she was there. - Continue with IV fluid -Monitor CK levels -Holding statin  Bilateral leg pain Especially with walking I suspect patient has peripheral artery disease, 1+ pedal pulses ABI ordered on admission - Ordered Dilaudid  as morphine  was not helping and patient was very restless due to pain  Displaced fracture of medial malleolus of right tibia, initial encounter for closed fracture Likely secondary to fall.  Patient had history of multiple fractures in the past-imaging was done due to her complaint of bilateral pain but right more than left.  Shows mildly displaced fracture of right medial malleolus. - Orthopedic was consulted -Supportive care with pain control  AKI (acute kidney injury) (HCC) Worsening creatinine, currently at 1.5 to.  Baseline seems to  be less than 1 and CKD stage II.  Likely due to rhabdomyolysis -Continue with IV fluid -Monitor renal function -Avoid nephrotoxins  Essential hypertension, benign Patient was hypotensive this morning requiring a bolus. - Holding home  amlodipine  due to hypotension -Keep holding lisinopril  and HCTZ as patient also has AKI -Continue to monitor  Hypercholesterolemia - Holding home statin due to rhabdomyolysis  Hypothyroidism Levothyroxine  88 mcg daily resumed   Obesity (BMI 30-39.9) Estimated body mass index is 33.97 kg/m as calculated from the following:   Height as of this encounter: 5\' 1"  (1.549 m).   Weight as of this encounter: 81.6 kg.   This complicates overall care and prognosis.   Depression, recurrent (HCC) Home sertraline  150 mg daily resumed    Subjective: Patient appears very restless and complaining of bilateral leg pain, more on right than left.  Denies any chest pain.  Physical Exam: Vitals:   06/03/23 2350 06/04/23 0355 06/04/23 0839 06/04/23 1129  BP: 107/73 (!) 101/55 (!) 98/57 (!) 77/57  Pulse: 78 76 77 69  Resp: 18 20 19    Temp:  97.8 F (36.6 C) 97.8 F (36.6 C) 98.5 F (36.9 C)  TempSrc:  Oral Oral Oral  SpO2: 96% 95% 98% 93%  Weight:      Height:       General.  Frail elderly lady, in no acute distress. Pulmonary.  Lungs clear bilaterally, normal respiratory effort. CV.  Regular rate and rhythm, no JVD, rub or murmur. Abdomen.  Soft, nontender, nondistended, BS positive. CNS.  Alert and oriented .  No focal neurologic deficit. Extremities.  No edema, no cyanosis, pulses 1+ bilaterally and warm extremities  Data Reviewed: Prior data reviewed  Family Communication: Discussed with son at bedside  Disposition: Status is: Inpatient Remains inpatient appropriate because: Severity of illness  Planned Discharge Destination:  To be determined  DVT prophylaxis.  Heparin  infusion Time spent: 50 minutes  This record has been created using Conservation officer, historic buildings. Errors have been sought and corrected,but may not always be located. Such creation errors do not reflect on the standard of care.   Author: [name], MD 06/04/2023 1:08 PM  For on call review  [email or web address].

## 2023-06-05 ENCOUNTER — Inpatient Hospital Stay

## 2023-06-05 DIAGNOSIS — I214 Non-ST elevation (NSTEMI) myocardial infarction: Secondary | ICD-10-CM | POA: Diagnosis not present

## 2023-06-05 DIAGNOSIS — I5021 Acute systolic (congestive) heart failure: Secondary | ICD-10-CM | POA: Diagnosis not present

## 2023-06-05 DIAGNOSIS — R400 Somnolence: Secondary | ICD-10-CM

## 2023-06-05 DIAGNOSIS — Z7189 Other specified counseling: Secondary | ICD-10-CM

## 2023-06-05 DIAGNOSIS — R569 Unspecified convulsions: Secondary | ICD-10-CM

## 2023-06-05 DIAGNOSIS — M6282 Rhabdomyolysis: Secondary | ICD-10-CM | POA: Diagnosis not present

## 2023-06-05 DIAGNOSIS — I639 Cerebral infarction, unspecified: Secondary | ICD-10-CM

## 2023-06-05 LAB — CBC
HCT: 37.3 % (ref 36.0–46.0)
Hemoglobin: 11.9 g/dL — ABNORMAL LOW (ref 12.0–15.0)
MCH: 30.4 pg (ref 26.0–34.0)
MCHC: 31.9 g/dL (ref 30.0–36.0)
MCV: 95.4 fL (ref 80.0–100.0)
Platelets: 207 10*3/uL (ref 150–400)
RBC: 3.91 MIL/uL (ref 3.87–5.11)
RDW: 14.4 % (ref 11.5–15.5)
WBC: 8 10*3/uL (ref 4.0–10.5)
nRBC: 0 % (ref 0.0–0.2)

## 2023-06-05 LAB — PROTIME-INR
INR: 1.1 (ref 0.8–1.2)
Prothrombin Time: 14.6 s (ref 11.4–15.2)

## 2023-06-05 LAB — GLUCOSE, CAPILLARY
Glucose-Capillary: 105 mg/dL — ABNORMAL HIGH (ref 70–99)
Glucose-Capillary: 112 mg/dL — ABNORMAL HIGH (ref 70–99)
Glucose-Capillary: 116 mg/dL — ABNORMAL HIGH (ref 70–99)
Glucose-Capillary: 122 mg/dL — ABNORMAL HIGH (ref 70–99)
Glucose-Capillary: 133 mg/dL — ABNORMAL HIGH (ref 70–99)
Glucose-Capillary: 84 mg/dL (ref 70–99)
Glucose-Capillary: 97 mg/dL (ref 70–99)

## 2023-06-05 LAB — BASIC METABOLIC PANEL WITH GFR
Anion gap: 9 (ref 5–15)
BUN: 38 mg/dL — ABNORMAL HIGH (ref 8–23)
CO2: 22 mmol/L (ref 22–32)
Calcium: 8.4 mg/dL — ABNORMAL LOW (ref 8.9–10.3)
Chloride: 108 mmol/L (ref 98–111)
Creatinine, Ser: 1.67 mg/dL — ABNORMAL HIGH (ref 0.44–1.00)
GFR, Estimated: 30 mL/min — ABNORMAL LOW (ref >60)
Glucose, Bld: 133 mg/dL — ABNORMAL HIGH (ref 70–99)
Potassium: 4.8 mmol/L (ref 3.5–5.1)
Sodium: 139 mmol/L (ref 135–145)

## 2023-06-05 LAB — MRSA NEXT GEN BY PCR, NASAL: MRSA by PCR Next Gen: NOT DETECTED

## 2023-06-05 LAB — CK: Total CK: 12935 U/L — ABNORMAL HIGH (ref 38–234)

## 2023-06-05 LAB — APTT: aPTT: 48 s — ABNORMAL HIGH (ref 24–36)

## 2023-06-05 LAB — HEPARIN LEVEL (UNFRACTIONATED): Heparin Unfractionated: 0.17 [IU]/mL — ABNORMAL LOW (ref 0.30–0.70)

## 2023-06-05 LAB — TROPONIN I (HIGH SENSITIVITY): Troponin I (High Sensitivity): 8592 ng/L (ref <18)

## 2023-06-05 MED ORDER — FENTANYL CITRATE PF 50 MCG/ML IJ SOSY
50.0000 ug | PREFILLED_SYRINGE | Freq: Once | INTRAMUSCULAR | Status: AC
  Administered 2023-06-05: 50 ug via INTRAVENOUS

## 2023-06-05 MED ORDER — LORAZEPAM 2 MG/ML IJ SOLN: 2.0000 mg | Freq: Once | INTRAMUSCULAR | Status: AC

## 2023-06-05 MED ORDER — HEPARIN BOLUS VIA INFUSION
2000.0000 [IU] | Freq: Once | INTRAVENOUS | Status: AC
  Administered 2023-06-05: 2000 [IU] via INTRAVENOUS
  Filled 2023-06-05: qty 2000

## 2023-06-05 MED ORDER — ASPIRIN 81 MG PO TBEC: 81.0000 mg | DELAYED_RELEASE_TABLET | Freq: Every day | ORAL | Status: DC

## 2023-06-05 MED ORDER — ASPIRIN 300 MG RE SUPP
300.0000 mg | Freq: Every day | RECTAL | Status: DC
  Administered 2023-06-06 (×2): 300 mg via RECTAL
  Filled 2023-06-05 (×2): qty 1

## 2023-06-05 MED ORDER — SODIUM CHLORIDE 0.9 % IV SOLN: INTRAVENOUS | Status: DC

## 2023-06-05 MED ORDER — IOHEXOL 350 MG/ML SOLN
60.0000 mL | Freq: Once | INTRAVENOUS | Status: AC | PRN
  Administered 2023-06-05: 60 mL via INTRAVENOUS

## 2023-06-05 MED ORDER — DEXTROSE IN LACTATED RINGERS 5 % IV SOLN
INTRAVENOUS | Status: DC
  Administered 2023-06-05: 100 mL/h via INTRAVENOUS

## 2023-06-05 MED ORDER — LORAZEPAM 2 MG/ML IJ SOLN
INTRAMUSCULAR | Status: AC
  Administered 2023-06-05: 2 mg via INTRAVENOUS
  Filled 2023-06-05: qty 1

## 2023-06-05 MED ORDER — FENTANYL CITRATE PF 50 MCG/ML IJ SOSY
25.0000 ug | PREFILLED_SYRINGE | Freq: Once | INTRAMUSCULAR | Status: AC
  Administered 2023-06-05: 50 ug via INTRAVENOUS
  Filled 2023-06-05: qty 1

## 2023-06-05 MED ORDER — ORAL CARE MOUTH RINSE: 15.0000 mL | OROMUCOSAL | Status: DC | PRN

## 2023-06-05 MED ORDER — GADOBUTROL 1 MMOL/ML IV SOLN
8.0000 mL | Freq: Once | INTRAVENOUS | Status: AC | PRN
  Administered 2023-06-05: 8 mL via INTRAVENOUS

## 2023-06-05 MED ORDER — LEVETIRACETAM IN NACL 1000 MG/100ML IV SOLN
1000.0000 mg | Freq: Once | INTRAVENOUS | Status: AC
  Administered 2023-06-05: 1000 mg via INTRAVENOUS
  Filled 2023-06-05: qty 100

## 2023-06-05 MED ORDER — ORAL CARE MOUTH RINSE
15.0000 mL | OROMUCOSAL | Status: DC
  Administered 2023-06-05 – 2023-06-08 (×13): 15 mL via OROMUCOSAL

## 2023-06-05 MED ORDER — LORAZEPAM 2 MG/ML IJ SOLN: 1.0000 mg | Freq: Once | INTRAMUSCULAR | Status: DC

## 2023-06-05 MED ORDER — CHLORHEXIDINE GLUCONATE CLOTH 2 % EX PADS
6.0000 | MEDICATED_PAD | Freq: Every day | CUTANEOUS | Status: DC
  Administered 2023-06-05 – 2023-06-06 (×2): 6 via TOPICAL

## 2023-06-05 MED ORDER — LORAZEPAM 2 MG/ML IJ SOLN
2.0000 mg | Freq: Once | INTRAMUSCULAR | Status: AC
  Administered 2023-06-05: 2 mg via INTRAVENOUS
  Filled 2023-06-05: qty 1

## 2023-06-05 NOTE — Progress Notes (Signed)
 Eeg done

## 2023-06-05 NOTE — Significant Event (Signed)
 Rapid Response Event Note   Reason for Call : pt mental status change, weakness on right side, more lethargic.  Initial Focused Assessment:  no grip on right hand and unable to lift up right arm, left arm grip present and movement against gravity, right facial droop with unable to tract from left to right, pt able to follow simple commands but not able to speak in full sentences. BP 132/63, HR 84, O2 94% on 4L [place], temp 97.6, BG 105  Interventions: Code stroke initiated and pt sent for Stat head CT, hep gtt stopped, tele cart neuro activated in CT and CT angio with contrast of head/neck done, PTT and INR drawn  Plan of Care: TNK ruled out by Neuro, and pt to have frequent neuro checks, q2hr vitals and upgrade to SD, transfer to ICU   MD Notified: Evette Hoes MD Call Time: [phone] Arrival Time:  End Time:  Cabrina Shiroma  Sosa Calderon, RN

## 2023-06-05 NOTE — Progress Notes (Signed)
 Patient transferred to ICU bed 8. Son provided with updates.

## 2023-06-05 NOTE — Progress Notes (Signed)
 This RN went into patient's room at 0400 to get vital signs on patient. On assessment patient was making a grunting sound and not responding to commands. Patient unable to speak although previously was speaking and Alert and oriented. Last know well was 0315. Rapid response team called. Code stroke called.   Updates provided to patients Daughter Laurie Hale. Attempted to update patients son Laurie Hale without success.

## 2023-06-05 NOTE — Assessment & Plan Note (Signed)
 Especially with walking ABI normal - Ordered Dilaudid  as morphine  was not helping and patient was very restless due to pain

## 2023-06-05 NOTE — Progress Notes (Signed)
 Pt continue to be lethargic, not following commands, responds to pain. Per Dr. Ariel Begun will d/c heparin  drip. Will continue to monitor closely.

## 2023-06-05 NOTE — Assessment & Plan Note (Signed)
 Worsening creatinine, currently at 1.  6 7.  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  Renal ultrasound without any significant abnormality.  Likely multifactorial with ATN due to hypoxia and NSTEMI plus rhabdomyolysis -Continue with IV fluid -Monitor renal function -Avoid nephrotoxins

## 2023-06-05 NOTE — Consult Note (Signed)
 [facility] Consult Services   Patient Name:   Laurie Hale, Laurie Hale Date of Birth:   [birth year] Identification Number:   MRN - [id number] Date of Service:   06/05/2023 04:11:58  Diagnosis:       I63.89 - Cerebrovascular accident (CVA) due to other mechanism [facility])  Impression:      the patient has a h/o HTN,HLD, chronic back pain, peripheral edema, CAD, and was admitted for rhabdomyolysis on 4/19 after being found down. tonight the nurse said at 3:15AM she was talking and had no facial droop and had been able to move her extremities somewhat despite rhabdo related weakness, but when she came back at 4AM the patient was unable to speak with R facial droop and R side was flaccid. She is on heparin  gtt due to elevated troponin with ST changes. Currently NIHSS is 27. She also has gaze deviation to the L and R side neglect. Acute stroke is suspected. IV thrombolysis is contraindicated as she is on Heparin  and PTT >40. Stat CTA/P is pending to assess for LVO. If CTA is negative then would consider unwitnessed seizure with postictal changes also.    Addendum: CTA and CTP completed - there is a small region of ischemia that appears completed in the L occipital region (6-7cc), and there is L PCA high grade stenosis but no occlusion. I spoke with neuroradiology as well and there is no occlusion of the anterior circulation. The CTA findings do not account for patient's global aphasia, dense R hemiplegia, and gaze preference. The L PCA stenosis is not a good target of intervention especially if the perfusion abnormality appears small and matched. An unwitnessed seizure is alternate diagnosis in this case. I recommend hold heparin , obtain stat EEG, trial of Keppra  1000mg  and ativan , and also routine MRI brain wwo contrast to further assess for stroke/seizure. She may need close monitoring in ICU. I discussed recommendations with Dr Alva Jewels      Our recommendations are outlined  below.  Recommendations:        Stroke/Telemetry Floor       Neuro Checks       Bedside Swallow Eval       DVT Prophylaxis       IV Fluids, Normal Saline       Head of Bed 30 Degrees       Euglycemia and Avoid Hyperthermia (PRN Acetaminophen )       Hold Anticoagulation for Now       Antihypertensives PRN if Blood pressure is greater than 220/120 or there is a concern for End organ damage/contraindications for permissive HTN. If blood pressure is greater than 220/120 give labetalol PO or IV or Vasotec IV with a goal of 15% reduction in BP during the first 24 hours.       trial ativan        load Keppra  1000mg        stat EEG       close monitoring in ICU       routine MRI brain wwo contrast and further stroke workup       hold heparin  pending MRI  Sign Out:       Discussed with Emergency Department Provider    ------------------------------------------------------------------------------  Advanced Imaging: CTA Head and Neck Completed.  CTP Completed.  LVO:No  Patient in not a candidate for NIR   Metrics: Last Known Well: 06/05/2023 03:15:00 Dispatch Time: 06/05/2023 04:11:58 Initial Response Time: 06/05/2023 04:15:13 Symptoms: R side weakness and aphasia. Initial patient interaction: 06/05/2023  04:18:05 NIHSS Assessment Completed: 06/05/2023 82:95:62 Patient is not a candidate for Thrombolytic. Thrombolytic Medical Decision: 06/05/2023 04:25:13 Patient was not deemed candidate for Thrombolytic because of following reasons: Acute bleeding diathesis (increased PTT> 40, PT > 15 OR INR => 1.7). .  I personally Reviewed the CT Head and it Showed No Acute Hemorrhage or Acute Core Infarct  Primary Provider Notified of Diagnostic Impression and Management Plan on: 06/05/2023 06:12:11 Spoke With: Dr Alva Jewels Able to Reach 06/05/2023 06:12:11    ------------------------------------------------------------------------------  History of Present Illness: Patient is a 83  year old Female.  Inpatient stroke alert was called for symptoms of R side weakness and aphasia. inpatient was admitted with rhabdo, and now has R sided weakness. Per the bedside nurse LKWT was 3:15AM - when she was able to talk to her nurse about the back pain and medication and had no facial droop. Then RN went back in at 4AM and She noticed she was grunting and not talking and had some droop to her face. She had generalized weakness when she came in. She is on heparin  gtt but just turned off. NSTEMI. Last PTT - She uses a walker at home. she lives with her son. She was found down on 4/19 on the floor by her son. 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 No witnessed seizure tonight.     Past Medical History:      Hypertension      Hyperlipidemia      Coronary Artery Disease      Migraine Headaches Other PMH:  chronic back pain  peripheral edema   unable to obtain due to:   Patient Cannot Speak  Medications:  Anticoagulant use:  Yes heparin  No Antiplatelet use Reviewed EMR for current medications  Allergies:  Reviewed Allergies Unable To Obtain Due To: Patient Cannot Speak  Social History: Unable To Obtain Due To Patient Status : Patient Cannot Speak  Family History:  Family History Cannot Be Obtained Because:Patient Cannot Speak  ROS : ROS Cannot Be Obtained Because:  Patient Cannot Speak  Past Surgical History: Past Surgical History Cannot Be Obtained Because: Patient Cannot Speak There Is No Surgical History Contributory To Today's Visit    Examination: BP(132/63), Pulse(89), 1A: Level of Consciousness - Arouses to minor stimulation + 1 1B: Ask Month and Age - Aphasic + 2 1C: Blink Eyes & Squeeze Hands - Performs 1 Task + 1 2: Test Horizontal Extraocular Movements - Partial Gaze Palsy: Can Be Overcome + 1 3: Test Visual Fields - Complete Hemianopia + 2 4: Test Facial Palsy (Use Grimace if Obtunded) - Partial paralysis (lower face) +  2 5A: Test Left Arm Motor Drift - Drift, but doesn't hit bed + 1 5B: Test Right Arm Motor Drift - No Movement + 4 6A: Test Left Leg Motor Drift - Some Effort Against Gravity + 2 6B: Test Right Leg Motor Drift - No Effort Against Gravity + 3 7: Test Limb Ataxia (FNF/Heel-Shin) - Does Not Understand + 0 8: Test Sensation - Complete Loss: Cannot Sense Being Touched At All + 2 9: Test Language/Aphasia - Mute/Global Aphasia: No Usable Speech/Auditory Comprehension + 3 10: Test Dysarthria - Mute/Anarthric + 2 11: Test Extinction/Inattention - Profound hemi-inattention (ex: does not recognize own hand) + 2  NIHSS Score: 28   Pre-Morbid Modified Rankin Scale: 3 Points = Moderate disability; requiring some help, but able to walk without assistance  Spoke with : Dr Alva Jewels  This consult was  conducted in real time using interactive audio and Immunologist. Patient was informed of the technology being used for this visit and agreed to proceed. Patient located in hospital and provider located at home/office setting.   Patient is being evaluated for possible acute neurologic impairment and high probability of imminent or life-threatening deterioration. I spent total of 50 minutes providing care to this patient, including time for face to face visit via telemedicine, review of medical records, imaging studies and discussion of findings with providers, the patient and/or family.   Dr Ilean Mall   [facility] For Inpatient follow-up with [facility] physician please call [facility] at [phone]. As we are not an outpatient service for any post hospital discharge needs please contact the hospital for assistance. If you have any questions for the [facility] physicians or need to reconsult for clinical or diagnostic changes please contact us  via [facility] at [phone].

## 2023-06-05 NOTE — Assessment & Plan Note (Signed)
 Blood pressure borderline soft this morning - Holding home amlodipine  due to hypotension -Keep holding lisinopril  and HCTZ as patient also has AKI -Continue to monitor

## 2023-06-05 NOTE — Consult Note (Signed)
 Consultation Note Date: 06/05/2023   Patient Name: Laurie Hale  DOB: 1941-01-21  MRN: [id number]  Age / Sex: 83 y.o., female  PCP: Rory Collard, MD Referring Physician: [name], MD  Reason for Consultation: Establishing goals of care  HPI/Patient Profile: PER EDP NOTE : "Laurie Hale is a 83 y.o. female with a past medical history of arthritis, chronic back pain, hypertension, hyperlipidemia, presents to the emergency department from home following a fall.  Patient is an extremely poor historian, patient states EMS said she was on the ground but she does not recall falling, does not know when she fell.  EMS reports patient lives with her son who states the patient was finishing up an antibiotic over the last 5 days for a urinary tract infection.  Patient has been experiencing leg pain bilaterally for the last 3 to 5 days per patient but states a history of the same, but again poor historian unclear how accurate this is, awaiting son's arrival for further history.  Patient's EKG did shows some ST changes, received 325 mg of aspirin .  Patient denies any chest pain or shortness of breath however satting 91% on room air placed on nasal cannula oxygen on arrival.  Here again very poor historian but denies any chest pain or shortness of breath denies any abdominal pain.  Does state pain in both of her legs mostly from her knees down however she states this has been going on for some time but is not able to quantify this.  Patient is unsure how long she was on the ground, does not recall even being on the ground however EMS states the patient had fallen and had to be picked up off the ground but unclear when the patient fell."   Clinical Assessment and Goals of Care: Notes and labs reviewed.  Diagnostics reviewed.  In to see patient.  She is currently resting in bed with eyes closed and mittens in place.  No family at  bedside at this time.   Updated by attending that she has just finished talking with son and daughter, who advise that patient does have living will and POA paperwork at home; daughter is looking for the documents.  Per attending, discussion regarding CODE STATUS was initiated.   PMT will follow-up with patient's children tomorrow.  SUMMARY OF RECOMMENDATIONS   PMT will follow        Primary Diagnoses: Present on Admission:  NSTEMI (non-ST elevated myocardial infarction) (HCC)  AKI (acute kidney injury) (HCC)  Obesity (BMI 30-39.9)  Iron  deficiency anemia  Hypothyroidism  Hypercholesterolemia  GERD (gastroesophageal reflux disease)  Essential hypertension, benign  Depression, recurrent (HCC)   I have reviewed the medical record, interviewed the patient and family, and examined the patient. The following aspects are pertinent.  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, DCIS   Cataract    immature bilateral   Chronic back pain    Constipation  Coronary artery disease    Diverticulosis    GERD (gastroesophageal reflux disease)    takes a med daily-to bring with med name at surgery   History of migraine    hasn't had one in yrs-per pt Zoloft  stopped them   Hyperlipidemia    takes Crestor  and Niacin daily   Hypertension    takes Amlodipine   and Lisinopril  daily   Hypokalemia    takes K Dur tid   Hypothyroidism    takes Synthroid  daily   Insomnia    takes restoril  prn   Joint pain    Joint swelling    Peripheral edema    takes HCTZ daily   Personal history of radiation therapy 2014   F/U lumpectomy    Presence of pessary    Social History   Socioeconomic History   Marital status: Married    Spouse name: Not on file   Number of children: Not on file   Years of education: Not on file   Highest education level: Not on file  Occupational History   Not on file  Tobacco Use   Smoking status: Never    Smokeless tobacco: Never  Vaping Use   Vaping status: Not on file  Substance and Sexual Activity   Alcohol  use: No   Drug use: No   Sexual activity: Not Currently  Other Topics Concern   Not on file  Social History Narrative   Not on file   Social Drivers of Health   Financial Resource Strain: Patient Declined (10/21/2022)   Received from [facility]   Overall Financial Resource Strain (CARDIA)    Difficulty of Paying Living Expenses: Patient declined  Food Insecurity: No Food Insecurity (06/03/2023)   Hunger Vital Sign    Worried About Running Out of Food in the Last Year: Never true    Ran Out of Food in the Last Year: Never true  Transportation Needs: No Transportation Needs (06/03/2023)   PRAPARE - Administrator, Civil Service (Medical): No    Lack of Transportation (Non-Medical): No  Physical Activity: Not on file  Stress: Not on file  Social Connections: Unknown (06/03/2023)   Social Connection and Isolation Panel [NHANES]    Frequency of Communication with Friends and Family: Patient declined    Frequency of Social Gatherings with Friends and Family: Patient declined    Attends Religious Services: Patient declined    Database administrator or Organizations: Patient declined    Attends Banker Meetings: Patient declined    Marital Status: Widowed   Family History  Problem Relation Age of Onset   Hypertension Mother    Sudden death Sister        x2   Breast cancer Sister    Heart disease Brother    Scheduled Meds:  Chlorhexidine  Gluconate Cloth  6 each Topical Daily   HYDROcodone -acetaminophen   1 tablet Oral Q6H   levothyroxine   88 mcg Oral Q0600   mouth rinse  15 mL Mouth Rinse 4 times per day   polyethylene glycol  17 g Oral BID   sertraline   150 mg Oral Daily   Continuous Infusions:  sodium chloride  Stopped (06/05/23 0800)   heparin  Stopped (06/05/23 0625)   PRN Meds:.acetaminophen  **OR** acetaminophen ,  HYDROmorphone  (DILAUDID ) injection, ondansetron  **OR** ondansetron  (ZOFRAN ) IV, mouth rinse, mouth rinse Medications Prior to Admission:  Prior to Admission medications   Medication Sig Start Date End Date Taking? Authorizing Provider  amLODipine  (NORVASC ) 10 MG  tablet Take 1 tablet (10 mg total) by mouth daily. 03/03/22  Yes Krishnan, Sendil K, MD  hydrochlorothiazide  (HYDRODIURIL ) 25 MG tablet Take by mouth. 03/20/12  Yes [provider]  levothyroxine  (SYNTHROID , LEVOTHROID) 88 MCG tablet Take 88 mcg by mouth at bedtime.   Yes [provider]  lisinopril  (PRINIVIL ,ZESTRIL ) 40 MG tablet Take 1 tablet (40 mg total) by mouth daily. 01/31/14  Yes Dellar Fenton, MD  rosuvastatin  (CRESTOR ) 20 MG tablet Take 20 mg by mouth every Monday, Wednesday, and Friday.   Yes [provider]  sertraline  (ZOLOFT ) 100 MG tablet Take 150 mg by mouth daily. 10/25/22  Yes [provider]   Allergies  Allergen Reactions   Ciprofloxacin    Penicillins Hives         Review of Systems  Unable to perform ROS   Physical Exam Constitutional:      Comments: Eyes closed. Does not awaken to voice or touch.   Pulmonary:     Effort: Pulmonary effort is normal.  Musculoskeletal:     Comments: Mittens in place.   Skin:    General: Skin is warm and dry.     Vital Signs: BP 112/66   Pulse 63   Temp 97.8 F (36.6 C) (Axillary)   Resp 15   Ht 5\' 1"  (1.549 m)   Wt 82.2 kg   SpO2 96%   BMI 34.24 kg/m  Pain Scale: 0-10   Pain Score: Asleep   SpO2: SpO2: 96 % O2 Device:SpO2: 96 % O2 Flow Rate: .O2 Flow Rate (L/min): 6 L/min  IO: Intake/output summary:  Intake/Output Summary (Last 24 hours) at 06/05/2023 1427 Last data filed at 06/05/2023 0900 Gross per 24 hour  Intake 310.06 ml  Output 400 ml  Net -89.94 ml    LBM: Last BM Date : 06/03/23 Baseline Weight: Weight: 81.6 kg Most recent weight: Weight: 82.2 kg        Signed by: Meribeth Standard, NP   Please  contact Palliative Medicine Team phone at [phone] for questions and concerns.  For individual provider: See Tilford Foley

## 2023-06-05 NOTE — Assessment & Plan Note (Signed)
 Patient denies any chest pain and she was more concentrated on leg pain.  Appears very restless. Echocardiogram with new diagnosis of HFrEF with EF less than 20% and global hypokinesis.  Troponin peaked at 10,962 CK-MB also elevated Cardiology is on board- -holding heparin  infusion due to recent stroke -Appreciate cardiology recommendations

## 2023-06-05 NOTE — Consult Note (Signed)
 NEUROLOGY CONSULT NOTE   Date of service: June 05, 2023 Patient Name: Laurie Hale MRN:  [id number] DOB:  1940/05/14 Chief Complaint: "Right-sided weakness and aphasia" Requesting Provider: [name], MD  History of Present Illness  Laurie Hale is a 83 y.o. female with hx of multiple stroke risk factors including hypertension, hyperlipidemia after being found down from an unknown period of time.  She was found to be in rhabdomyolysis and was presumed from being down milligram for so long.  Echo with severely diminished ejection fraction with less than 20%.  She has AKI with a creatinine of 1.67.  Last night, she had a sudden change in her neurological status and was evaluated by teleneurology.  She was taken for an emergent CT/CTA/CTP which demonstrated a small matched area of ischemia in the posterior quadrant on the left, but no actionable LVO.  LKW: 3:15 AM IV Thrombolysis: No, heparin  drip EVT: No, no LVO NIHSS components Score: Comment  1a Level of Conscious 0[]  1[]  2[x]  3[]      1b LOC Questions 0[]  1[]  2[x]       1c LOC Commands 0[]  1[]  2[x]       2 Best Gaze 0[x]  1[]  2[]       3 Visual 0[]  1[]  2[]  3[x]      4 Facial Palsy 0[]  1[x]  2[]  3[]      5a Motor Arm - left 0[]  1[]  2[]  3[x]  4[]  UN[]    5b Motor Arm - Right 0[]  1[]  2[x]  3[]  4[]  UN[]    6a Motor Leg - Left 0[]  1[]  2[x]  3[]  4[]  UN[]    6b Motor Leg - Right 0[]  1[]  2[x]  3[]  4[]  UN[]    7 Limb Ataxia 0[x]  1[]  2[]  3[]  UN[]     8 Sensory 0[x]  1[]  2[]  UN[]      9 Best Language 0[]  1[]  2[]  3[x]      10 Dysarthria 0[]  1[]  2[x]  UN[]      11 Extinct. 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, DCIS   Cataract    immature bilateral   Chronic back pain    Constipation    Coronary artery disease    Diverticulosis    GERD (gastroesophageal reflux disease)    takes a med daily-to bring with med name at surgery    History of migraine    hasn't had one in yrs-per pt Zoloft  stopped them   Hyperlipidemia    takes Crestor  and Niacin daily   Hypertension    takes Amlodipine   and Lisinopril  daily   Hypokalemia    takes K Dur tid   Hypothyroidism    takes Synthroid  daily   Insomnia    takes restoril  prn   Joint pain    Joint swelling    Peripheral edema    takes HCTZ daily   Personal history of radiation therapy 2014   F/U lumpectomy    Presence of pessary     Past Surgical History:  Procedure Laterality Date   ABDOMINAL HYSTERECTOMY     BACK SURGERY  2009   BREAST BIOPSY Left 10/22/2012   Stereo - DCIS   BREAST LUMPECTOMY Left 2014   DCIS. F/U radiation    CARDIAC CATHETERIZATION  1999   CATARACT EXTRACTION W/PHACO Right 10/05/2015   Procedure: CATARACT EXTRACTION PHACO AND INTRAOCULAR LENS PLACEMENT (IOC);  Surgeon: Chandra Come  Vin-Parikh, MD;  Location: [facility];  Service: Ophthalmology;  Laterality: Right;  RIGHT   CATARACT EXTRACTION W/PHACO Left 11/16/2015   Procedure: CATARACT EXTRACTION PHACO AND INTRAOCULAR LENS PLACEMENT (IOC);  Surgeon: Billee Buddle, MD;  Location: [facility];  Service: Ophthalmology;  Laterality: Left;  LEFT   COLONOSCOPY     CORONARY ANGIOPLASTY     1 stent   partial coloectomy  1995   right knee arthroscopy  2013   TONSILLECTOMY     TOTAL KNEE ARTHROPLASTY  01/02/2012   RIGHT KNEE   TOTAL KNEE ARTHROPLASTY  01/02/2012   Procedure: TOTAL KNEE ARTHROPLASTY;  Surgeon: Christie Cox, MD;  Location: [facility];  Service: Orthopedics;  Laterality: Right;   TOTAL KNEE ARTHROPLASTY Left 10/09/2017   Procedure: LEFT TOTAL KNEE ARTHROPLASTY;  Surgeon: Christie Cox, MD;  Location: [facility];  Service: Orthopedics;  Laterality: Left;  with block    Family History: Family History  Problem Relation Age of Onset   Hypertension Mother    Sudden death Sister        x2   Breast cancer Sister    Heart disease Brother     Social History   reports that she has never smoked. She has never used smokeless tobacco. She reports that she does not drink alcohol  and does not use drugs.  Allergies  Allergen Reactions   Ciprofloxacin    Penicillins Hives          Medications   Current Facility-Administered Medications:    acetaminophen  (TYLENOL ) tablet 650 mg, 650 mg, Oral, Q6H PRN, 650 mg at 06/03/23 2118 **OR** acetaminophen  (TYLENOL ) suppository 650 mg, 650 mg, Rectal, Q6H PRN, Cox, Amy N, DO   Chlorhexidine  Gluconate Cloth 2 % PADS 6 each, 6 each, Topical, Daily, Amin, Sumayya, MD, 6 each at 06/05/23 0913   dextrose  5 % in lactated ringers  infusion, , Intravenous, Continuous, [name], MD, Last Rate: 100 mL/hr at 06/05/23 1652, 100 mL/hr at 06/05/23 1652   HYDROcodone -acetaminophen  (NORCO/VICODIN) 5-325 MG per tablet 1 tablet, 1 tablet, Oral, Q6H, Ree Candy, MD, 1 tablet at 06/04/23 2342   HYDROmorphone  (DILAUDID ) injection 0.5 mg, 0.5 mg, Intravenous, Q4H PRN, Amin, Sumayya, MD, 0.5 mg at 06/05/23 1748   levothyroxine  (SYNTHROID ) tablet 88 mcg, 88 mcg, Oral, Q0600, Cox, Amy N, DO, 88 mcg at 06/04/23 1610   ondansetron  (ZOFRAN ) tablet 4 mg, 4 mg, Oral, Q6H PRN **OR** ondansetron  (ZOFRAN ) injection 4 mg, 4 mg, Intravenous, Q6H PRN, Cox, Amy N, DO, 4 mg at 06/03/23 2228   Oral care mouth rinse, 15 mL, Mouth Rinse, PRN, Cox, Amy N, DO   Oral care mouth rinse, 15 mL, Mouth Rinse, 4 times per day, [name], MD, 15 mL at 06/05/23 1501   Oral care mouth rinse, 15 mL, Mouth Rinse, PRN, Amin, Sumayya, MD   polyethylene glycol (MIRALAX  / GLYCOLAX ) packet 17 g, 17 g, Oral, BID, Evette Hoes L, MD, 17 g at 06/04/23 1024   sertraline  (ZOLOFT ) tablet 150 mg, 150 mg, Oral, Daily, Cox, Amy N, DO, 150 mg at 06/04/23 1024  Vitals   Vitals:   06/05/23 1530 06/05/23 1600 06/05/23 1700 06/05/23 1800  BP: 131/65 (!) 141/72 124/66 123/61  Pulse: 81 95 76 82  Resp: 20 15 16 15   Temp:  97.7 F (36.5 C)    TempSrc:   Axillary    SpO2: 93% 97% 97% 94%  Weight:      Height:  Body mass index is 34.24 kg/m.  Physical Exam   Constitutional: Appears well-developed and well-nourished.   Neurologic Examination   She received Ativan  for MRI shortly prior to my exam Neuro: Mental Status: Patient is obtunded Cranial Nerves: II: She does not blink to threat from either direction. Pupils are equal, round, and reactive to light.   III,IV, VI: Eyes are midline, crossed with doll's maneuver She does have some degree of right facial weakness Motor: She does withdraw minimally her right upper extremity, withdraws more briskly her right lower extremity, moves both her left arm and left leg spontaneously  sensory: She responds to noxious stimulation on the right but requires more stimulation to do so Cerebellar: Does not perform       Labs/Imaging/Neurodiagnostic studies   CBC:  Recent Labs  Lab 2023-06-30 0506 06/05/23 0437  WBC 11.0* 8.0  HGB 12.7 11.9*  HCT 38.2 37.3  MCV 92.0 95.4  PLT 245 207   Basic Metabolic Panel:  Lab Results  Component Value Date   NA 139 06/05/2023   K 4.8 06/05/2023   CO2 22 06/05/2023   GLUCOSE 133 (H) 06/05/2023   BUN 38 (H) 06/05/2023   CREATININE 1.67 (H) 06/05/2023   CALCIUM  8.4 (L) 06/05/2023   GFRNONAA 30 (L) 06/05/2023   GFRAA >60 10/18/2018   Lipid Panel:  Lab Results  Component Value Date   LDLCALC 92 10/15/2013   HgbA1c: No results found for: "HGBA1C" Urine Drug Screen: No results found for: "LABOPIA", "COCAINSCRNUR", "LABBENZ", "AMPHETMU", "THCU", "LABBARB"  Alcohol  Level No results found for: "ETH" INR  Lab Results  Component Value Date   INR 1.1 06/05/2023   APTT  Lab Results  Component Value Date   APTT 48 (H) 06/05/2023   MRI Brain(Personally reviewed): Sizable left parietal infarct   ASSESSMENT   EMMAJEAN RATLEDGE is a 83 y.o. female with a history of hypertension, hyperlipidemia with sizable left parietal infarct.  My  suspicion is that this was likely embolic with a larger territory at risk initially given the initial description of exam, with distal mobilization.  Unfortunately, she still has a fairly severe aphasia, though difficult to be certain given that she received Ativan  shortly prior to my exam.  Given her low EF, I do suspect that this is likely cardioembolic.  The strong indication for anticoagulation continues to exist, it could be considered, but I think with the size of the infarct I would favor holding off of full dose anticoagulation for a few days at least.  With the MRI finding, I think we have an explanation for change, and I do not think that we need to continue any type of antiepileptic.  RECOMMENDATIONS  Aspirin  81 mg daily Continue telemetry Dopplers to look for DVT Cardiac management per IM and cardiology ______________________________________________________________________    Signed, Ann Keto, MD [facility]

## 2023-06-05 NOTE — Plan of Care (Signed)
  Problem: Health Behavior/Discharge Planning: Goal: Ability to manage health-related needs will improve Outcome: Progressing   Problem: Clinical Measurements: Goal: Will remain free from infection Outcome: Progressing   Problem: Elimination: Goal: Will not experience complications related to bowel motility Outcome: Progressing   Problem: Safety: Goal: Ability to remain free from injury will improve Outcome: Progressing   Problem: Skin Integrity: Goal: Risk for impaired skin integrity will decrease Outcome: Progressing

## 2023-06-05 NOTE — Assessment & Plan Note (Signed)
 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  Prior echo was done in October 2020 and it was normal.  BNP elevated at 1009. Clinically appears euvolemic - Management per cardiology - Monitor volume status closely as giving some IV fluid due to rhabdomyolysis.  She will remain high risk for a flash pulmonary edema

## 2023-06-05 NOTE — Progress Notes (Signed)
 69 - Code Stroke Activated, Patient arrived to CT   LKWT 0315, mRS 2. ROS 0400 RN found patient aphasic, R sided weakness, & R facial droop. Patient on heparin  gtt currently stopped.   73 - Tele-Neurologist paged  [phone] - Dr. Ilean Mall joined stroke cart   [phone] - NCCT results given too Dr. Farrel Hones on camera   0425 - TNK decision time, No TNK due to pTT in therapeutic range for heparin    0430 - Patient to get advanced imaging   0455 - Patient returned to room from CT   0529 - CTA & CTP results called directly to Dr. Farrel Hones from Dr. Del Favia in Radiology

## 2023-06-05 NOTE — Progress Notes (Signed)
 [facility] CLINIC CARDIOLOGY PROGRESS NOTE       Patient ID: Laurie Hale MRN: [id number] DOB/AGE: 09-30-1940 83 y.o.  Admit date: 06/03/2023 Referring Physician Dr Reinhold Carbine Primary Physician Rory Collard, MD Primary Cardiologist Bary Likes - 5 years ago (retired) Reason for Consultation NSTEMI/rhabdo  HPI: [name] is an 83 year old female with history of coronary artery disease s/p stent placement, hypertension, hyperlipidemia, chronic back pain, hypothyroid, who presents emergency department for chief concerns of a fall at home. Patient had been laying on the floor at home for an unknown amount of time coming in with significant rhabdo and troponin elevation peaking at 10,000, concerning for NSTEMI type 1 vs. Type 2. CK peaked at 36,000 and CKMB at 240.   Interval History: -During the time of my exam, patient is asleep and obtunded this afternoon. -Given her significant rhabdo and uptrending Cr, we will hold off on plans for cardiac cath for now.  -Cr continues to uptrend, now at 1.67. K at 4.8.  -At 4AM this morning, patient had right sided facial droop and right side was flaccid. Heparin  gtt was paused at this time.  -CTA and CTP revealed ischemia in the left occipital region and left PCA high grade stenosis. Per Neurology an unwitnessed seizure is an alternative diagnosis in this case. Patient was started on Keppra  and ativan . EEG obtained and pending.   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, DCIS   Cataract    immature bilateral   Chronic back pain    Constipation    Coronary artery disease    Diverticulosis    GERD (gastroesophageal reflux disease)    takes a med daily-to bring with med name at surgery   History of migraine    hasn't had one in yrs-per pt Zoloft  stopped them   Hyperlipidemia    takes Crestor  and Niacin daily    Hypertension    takes Amlodipine   and Lisinopril  daily   Hypokalemia    takes K Dur tid   Hypothyroidism    takes Synthroid  daily   Insomnia    takes restoril  prn   Joint pain    Joint swelling    Peripheral edema    takes HCTZ daily   Personal history of radiation therapy 2014   F/U lumpectomy    Presence of pessary     Past Surgical History:  Procedure Laterality Date   ABDOMINAL HYSTERECTOMY     BACK SURGERY  2009   BREAST BIOPSY Left 10/22/2012   Stereo - DCIS   BREAST LUMPECTOMY Left 2014   DCIS. F/U radiation    CARDIAC CATHETERIZATION  1999   CATARACT EXTRACTION W/PHACO Right 10/05/2015   Procedure: CATARACT EXTRACTION PHACO AND INTRAOCULAR LENS PLACEMENT (IOC);  Surgeon: Billee Buddle, MD;  Location: [facility];  Service: Ophthalmology;  Laterality: Right;  RIGHT   CATARACT EXTRACTION W/PHACO Left 11/16/2015   Procedure: CATARACT EXTRACTION PHACO AND INTRAOCULAR LENS PLACEMENT (IOC);  Surgeon: Billee Buddle, MD;  Location: [facility];  Service: Ophthalmology;  Laterality: Left;  LEFT   COLONOSCOPY     CORONARY ANGIOPLASTY     1 stent   partial coloectomy  1995   right knee arthroscopy  2013   TONSILLECTOMY     TOTAL KNEE ARTHROPLASTY  01/02/2012   RIGHT KNEE   TOTAL  KNEE ARTHROPLASTY  01/02/2012   Procedure: TOTAL KNEE ARTHROPLASTY;  Surgeon: Christie Cox, MD;  Location: [facility] OR;  Service: Orthopedics;  Laterality: Right;   TOTAL KNEE ARTHROPLASTY Left 10/09/2017   Procedure: LEFT TOTAL KNEE ARTHROPLASTY;  Surgeon: Christie Cox, MD;  Location: [facility];  Service: Orthopedics;  Laterality: Left;  with block    Medications Prior to Admission  Medication Sig Dispense Refill Last Dose/Taking   amLODipine  (NORVASC ) 10 MG tablet Take 1 tablet (10 mg total) by mouth daily. 30 tablet 1 06/02/2023   hydrochlorothiazide  (HYDRODIURIL ) 25 MG tablet Take by mouth.   06/02/2023   levothyroxine  (SYNTHROID , LEVOTHROID) 88 MCG tablet Take 88 mcg by  mouth at bedtime.   06/02/2023   lisinopril  (PRINIVIL ,ZESTRIL ) 40 MG tablet Take 1 tablet (40 mg total) by mouth daily. 30 tablet 0 06/02/2023   rosuvastatin  (CRESTOR ) 20 MG tablet Take 20 mg by mouth every Monday, Wednesday, and Friday.   06/02/2023   sertraline  (ZOLOFT ) 100 MG tablet Take 150 mg by mouth daily.   06/02/2023   Social History   Socioeconomic History   Marital status: Married    Spouse name: Not on file   Number of children: Not on file   Years of education: Not on file   Highest education level: Not on file  Occupational History   Not on file  Tobacco Use   Smoking status: Never   Smokeless tobacco: Never  Vaping Use   Vaping status: Not on file  Substance and Sexual Activity   Alcohol  use: No   Drug use: No   Sexual activity: Not Currently  Other Topics Concern   Not on file  Social History Narrative   Not on file   Social Drivers of Health   Financial Resource Strain: Patient Declined (10/21/2022)   Received from [facility]   Overall Financial Resource Strain (CARDIA)    Difficulty of Paying Living Expenses: Patient declined  Food Insecurity: No Food Insecurity (06/03/2023)   Hunger Vital Sign    Worried About Running Out of Food in the Last Year: Never true    Ran Out of Food in the Last Year: Never true  Transportation Needs: No Transportation Needs (06/03/2023)   PRAPARE - Administrator, Civil Service (Medical): No    Lack of Transportation (Non-Medical): No  Physical Activity: Not on file  Stress: Not on file  Social Connections: Unknown (06/03/2023)   Social Connection and Isolation Panel [NHANES]    Frequency of Communication with Friends and Family: Patient declined    Frequency of Social Gatherings with Friends and Family: Patient declined    Attends Religious Services: Patient declined    Database administrator or Organizations: Patient declined    Attends Banker Meetings: Patient declined    Marital  Status: Widowed  Intimate Partner Violence: Not At Risk (06/03/2023)   Humiliation, Afraid, Rape, and Kick questionnaire    Fear of Current or Ex-Partner: No    Emotionally Abused: No    Physically Abused: No    Sexually Abused: No    Family History  Problem Relation Age of Onset   Hypertension Mother    Sudden death Sister        x2   Breast cancer Sister    Heart disease Brother      Vitals:   06/05/23 1500 06/05/23 1530 06/05/23 1600 06/05/23 1700  BP:  131/65 (!) 141/72 124/66  Pulse: 92 81 95 76  Resp: Aaron Aas)  21 20 15 16   Temp:   97.7 F (36.5 C)   TempSrc:   Axillary   SpO2: 99% 93% 97% 97%  Weight:      Height:        PHYSICAL EXAM General: sleeping, well nourished, in no acute distress. HEENT: Normocephalic and atraumatic. Neck: No JVD.  Lungs: Normal respiratory effort. Clear bilaterally to auscultation. No wheezes, crackles, rhonchi.  Heart: HRRR. Normal S1 and S2 without gallops or murmurs.  Abdomen: Non-distended appearing.  Msk: Normal strength and tone for age. Extremities: Warm and well perfused. No clubbing, cyanosis. no edema.  Neuro: Obtunded.   Labs: Basic Metabolic Panel: Recent Labs    06/04/23 0506 06/05/23 0437  NA 138 139  K 4.6 4.8  CL 110 108  CO2 20* 22  GLUCOSE 134* 133*  BUN 35* 38*  CREATININE 1.52* 1.67*  CALCIUM  8.5* 8.4*   Liver Function Tests: No results for input(s): "AST", "ALT", "ALKPHOS", "BILITOT", "PROT", "ALBUMIN" in the last 72 hours. No results for input(s): "LIPASE", "AMYLASE" in the last 72 hours. CBC: Recent Labs    06/04/23 0506 06/05/23 0437  WBC 11.0* 8.0  HGB 12.7 11.9*  HCT 38.2 37.3  MCV 92.0 95.4  PLT 245 207   Cardiac Enzymes: Recent Labs    06/03/23 0909 06/04/23 0506 06/04/23 0901 06/04/23 1026 06/05/23 0437  CKTOTAL 6,330* 16,109*  --   --  12,935*  CKMB  --   --   --  239.8*  --   TROPONINIHS 3,186*  --  10,962* 10,573* 8,592*   BNP: Recent Labs    06/04/23 0901  BNP 1,009.2*    D-Dimer: No results for input(s): "DDIMER" in the last 72 hours. Hemoglobin A1C: No results for input(s): "HGBA1C" in the last 72 hours. Fasting Lipid Panel: No results for input(s): "CHOL", "HDL", "LDLCALC", "TRIG", "CHOLHDL", "LDLDIRECT" in the last 72 hours. Thyroid  Function Tests: No results for input(s): "TSH", "T4TOTAL", "T3FREE", "THYROIDAB" in the last 72 hours.  Invalid input(s): "FREET3" Anemia Panel: No results for input(s): "VITAMINB12", "FOLATE", "FERRITIN", "TIBC", "IRON ", "RETICCTPCT" in the last 72 hours.   Radiology: US  RENAL Result Date: 06/05/2023 CLINICAL DATA:  AKI EXAM: RENAL / URINARY TRACT ULTRASOUND COMPLETE COMPARISON:  None Available. FINDINGS: Right Kidney: Renal measurements: 10.7 x 4.9 x 9.2 cm = volume: 144 mL. Echogenicity within normal limits. No mass or hydronephrosis visualized. Left Kidney: Renal measurements: 10.2 x 4.5 x 4.8 cm = volume: 117 mL. Echogenicity within normal limits. No mass or hydronephrosis visualized. Bladder: Appears normal for degree of bladder distention. Other: None. IMPRESSION: No collecting system dilatation. Electronically Signed   By: Adrianna Horde M.D.   On: 06/05/2023 13:29   CT ANGIO HEAD NECK W WO CM W PERF (CODE STROKE) Result Date: 06/05/2023 CLINICAL DATA:  83 year old female code stroke presentation. EXAM: CT ANGIOGRAPHY HEAD AND NECK CT PERFUSION BRAIN TECHNIQUE: Multidetector CT imaging of the head and neck was performed using the standard protocol during bolus administration of intravenous contrast. Multiplanar CT image reconstructions and MIPs were obtained to evaluate the vascular anatomy. Carotid stenosis measurements (when applicable) are obtained utilizing NASCET criteria, using the distal internal carotid diameter as the denominator. Multiphase CT imaging of the brain was performed following IV bolus contrast injection. Subsequent parametric perfusion maps were calculated using RAPID software. RADIATION DOSE REDUCTION:  This exam was performed according to the departmental dose-optimization program which includes automated exposure control, adjustment of the mA and/or kV according to patient size and/or use  of iterative reconstruction technique. CONTRAST:  60mL OMNIPAQUE  IOHEXOL  350 MG/ML SOLN COMPARISON:  Plain head CT 0412 hours today. FINDINGS: CT Brain Perfusion Findings: Mildly motion degraded. CBF (<30%) Volume: 7mL Perfusion (Tmax>6.0s) volume: 6mL, with T-max greater than 10 seconds of 5 mL and hypoperfusion index of 0.8 Mismatch Volume: None apparent Infarction Location:Left PCA territory CTA NECK Skeleton: Cervical ACDF C4-C5 and C5-C6. Pseudoarthrosis at the latter. Underlying advanced cervical spine degeneration. Degenerative appearing C2-C3 ankylosis. No acute osseous abnormality identified. Upper chest: Layering right pleural effusion with simple fluid density appears to be transudate, up to moderate. Patchy right lung atelectasis associated. Lung apices relatively clear. Negative visible superior mediastinum. Other neck: Retropharyngeal course of the carotids. Other nonvascular neck soft tissue spaces are within normal limits. Aortic arch: Calcified aortic atherosclerosis.  3 vessel arch. Right carotid system: Brachiocephalic artery plaque without stenosis. Mildly tortuous right CCA. Partially retropharyngeal course. Moderate calcified plaque at the left ICA origin and bulb but less than 50 % stenosis with respect to the distal vessel. Left carotid system: Minimal plaque at the left CCA origin. Mild to moderate soft and calcified plaque at the left carotid bifurcation, proximal left ICA without stenosis. Tortuous and retropharyngeal left ICA distal to the bulb. Vertebral arteries: Mild plaque at the right subclavian artery origin without stenosis. Calcified plaque at the right vertebral artery origin without stenosis. Additional right V1 mild calcified plaque and tortuosity without stenosis. Right vertebral artery is  patent to the skull base. Proximal left subclavian artery soft and calcified plaque without stenosis. Calcified plaque at the left vertebral artery origin with mild stenosis on series 8, image 131. Tortuous left V1 segment. Codominant left vertebral artery is patent to the skull base without additional plaque. CTA HEAD Posterior circulation: Left vertebral V4 segment mild calcified plaque without stenosis to the vertebrobasilar junction. Right vertebral V4 segment mild to moderate calcified plaque with mild stenosis series 5, image 263. Patent right PICA origin and vertebrobasilar junction. Patent basilar artery, dominant appearing left AICA, SCA and left PCA origins. No basilar stenosis. Fetal type right PCA origin. Left posterior communicating artery diminutive or absent. Right PCA branches are patent with mild P2 segment irregularity, mild stenosis. There are tandem moderate to severe stenoses of the left PCA P2 segment (series 7, image 22), the worst visible on series 14, image 20 and high-grade with near occlusion. However, there is maintained distal left PCA branch enhancement. Anterior circulation: Both ICA siphons are patent with calcified atherosclerosis. Left siphon calcified plaque is moderate, with mild stenosis at the anterior genu. Right siphon calcified plaque is moderate but there is no convincing right siphon stenosis. Right posterior communicating artery origin is normal. Patent carotid termini, MCA and ACA origins. Anterior communicating artery, bilateral ACA branches are within normal limits. Left MCA M1 segment is tortuous. Left MCA bifurcation is patent without stenosis. Right MCA M1 segment and bifurcation are patent without stenosis. Bilateral MCA branches are within normal limits. Venous sinuses: Patent. Anatomic variants: Fetal right PCA origin.  Dominant left AICA. Review of the MIP images confirms the above findings IMPRESSION: 1. Negative for large vessel occlusion but Positive for  High-grade Stenoses/near-occlusion of the Left PCA P2 segment, and that territory registers abnormal oligemia and possible small infarct core on CT Perfusion (7 mL). 2. Other atherosclerosis in the head and neck without hemodynamically significant stenosis. 3. Layering right pleural effusion with right lung atelectasis. Aortic Atherosclerosis (ICD10-I70.0). #1 discussed by telephone with Dr. Ilean Mall on 06/05/2023 at 05:29.  He advises that on exam she has significant deficits of a left MCA syndrome, discordant with the findings here. But we did discuss the possibility of acute on chronic left thalamic ischemia (left thalamostriate artery territory) and/or seizure with Todd's paralysis. Electronically Signed   By: Marlise Simpers M.D.   On: 06/05/2023 05:35   CT HEAD CODE STROKE WO CONTRAST Result Date: 06/05/2023 CLINICAL DATA:  Code stroke. 83 year old female with loss of speech. Recent fall, head trauma. EXAM: CT HEAD WITHOUT CONTRAST TECHNIQUE: Contiguous axial images were obtained from the base of the skull through the vertex without intravenous contrast. RADIATION DOSE REDUCTION: This exam was performed according to the departmental dose-optimization program which includes automated exposure control, adjustment of the mA and/or kV according to patient size and/or use of iterative reconstruction technique. COMPARISON:  Brain MRI 12/10/2014.  Head CT 06/03/2023. FINDINGS: Brain: Stable cerebral volume. 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 Dilated perivascular space of the inferior lentiform bilaterally, more apparent on the right today, but was present in 2016 (coronal image 30) and stable from head CT last year. No midline shift, ventriculomegaly, mass effect, evidence of mass lesion, intracranial hemorrhage or evidence of cortically based acute infarction. Vascular: Calcified atherosclerosis at the skull base. No suspicious intracranial vascular hyperdensity. Skull: Stable and intact.  Sinuses/Orbits: Visualized paranasal sinuses and mastoids are stable and well aerated. Other: No acute orbit or scalp soft tissue finding. ASPECTS [facility] Stroke Program Early CT Score) Total score (0-10 with 10 being normal): 10 IMPRESSION: 1. No acute cortically based infarct or acute intracranial hemorrhage identified. ASPECTS 10. 2. Stable non contrast CT appearance of chronic small vessel disease. Electronically Signed   By: Marlise Simpers M.D.   On: 06/05/2023 04:23   US  ARTERIAL ABI (SCREENING LOWER EXTREMITY) Result Date: 06/04/2023 CLINICAL DATA:  04540 Leg pain [id number] Peripheral arterial disease (HCC) 562130 EXAM: NONINVASIVE PHYSIOLOGIC VASCULAR STUDY OF BILATERAL LOWER EXTREMITIES TECHNIQUE: Evaluation of both lower extremities were performed at rest, including calculation of ankle-brachial indices with single level Doppler, pressure and pulse volume recording. COMPARISON:  None Available. FINDINGS: Right ABI:  1.1 Left ABI:  1.0 Right Lower Extremity:  Normal arterial waveforms at the ankle. Left Lower Extremity:  Normal arterial waveforms at the ankle. 1.0-1.4 Normal IMPRESSION: Normal bilateral resting ankle-brachial indices. Electronically Signed   By: Fernando Hoyer M.D.   On: 06/04/2023 15:02   DG Tibia/Fibula Right Result Date: 06/04/2023 CLINICAL DATA:  Elwin Hammond no fracture. EXAM: RIGHT TIBIA AND FIBULA - 2 VIEW COMPARISON:  Right ankle radiographs 06/04/2023 FINDINGS: Redemonstration of transverse fracture of the superior medial malleolus as seen on right ankle radiographs earlier same day. Status post total right knee arthroplasty. No perihardware lucency is seen. No acute fracture is seen within the more proximal aspect of the tibia or fibula. Redemonstration of likely old healed fracture of the distal fibular diaphysis and metaphysis. IMPRESSION: 1. Redemonstration of transverse fracture of the superior medial malleolus as seen on right ankle radiographs earlier same day. 2. No acute  fracture is seen within the more proximal aspect of the tibia or fibula. Electronically Signed   By: Bertina Broccoli M.D.   On: 06/04/2023 14:27   ECHOCARDIOGRAM COMPLETE Result Date: 06/04/2023    ECHOCARDIOGRAM REPORT   Patient Name:   Laurie Hale Date of Exam: 06/03/2023 Medical Rec #:  [id number]   Height:       61.0 in Accession #:    [id number]  Weight:  179.8 lb Date of Birth:  [birth year]   BSA:          1.805 m Patient Age:    82 years    BP:           121/89 mmHg Patient Gender: F           HR:           89 bpm. Exam Location:  [facility] Procedure: 2D Echo, Cardiac Doppler, Color Doppler and Strain Analysis (Both            Spectral and Color Flow Doppler were utilized during procedure). Indications:     NSTEMI I21.4  History:         Patient has no prior history of Echocardiogram examinations.                  CAD; Risk Factors:Hypertension.  Sonographer:     Kathaleen Pale Roar Referring Phys:  [id number] AMY N COX Diagnosing Phys: Lanell Pinta Custovic  Sonographer Comments: Global longitudinal strain was attempted. IMPRESSIONS  1. Left ventricular ejection fraction, by estimation, is <20%. The left ventricle has severely decreased function. The left ventricle demonstrates global hypokinesis. The left ventricular internal cavity size was mildly to moderately dilated. Left ventricular diastolic parameters are consistent with Grade I diastolic dysfunction (impaired relaxation).  2. Right ventricular systolic function is normal. The right ventricular size is normal.  3. The mitral valve is normal in structure. Mild mitral valve regurgitation. No evidence of mitral stenosis.  4. Tricuspid valve regurgitation is moderate.  5. The aortic valve is normal in structure. Aortic valve regurgitation is trivial. No aortic stenosis is present.  6. The inferior vena cava is normal in size with greater than 50% respiratory variability, suggesting right atrial pressure of 3 mmHg. FINDINGS  Left Ventricle: Left ventricular ejection  fraction, by estimation, is <20%. The left ventricle has severely decreased function. The left ventricle demonstrates global hypokinesis. The left ventricular internal cavity size was mildly to moderately dilated. There is no left ventricular hypertrophy. Left ventricular diastolic parameters are consistent with Grade I diastolic dysfunction (impaired relaxation). Right Ventricle: The right ventricular size is normal. No increase in right ventricular wall thickness. Right ventricular systolic function is normal. Left Atrium: Left atrial size was normal in size. Right Atrium: Right atrial size was normal in size. Pericardium: There is no evidence of pericardial effusion. Mitral Valve: The mitral valve is normal in structure. Mild mitral valve regurgitation. No evidence of mitral valve stenosis. MV peak gradient, 2.7 mmHg. The mean mitral valve gradient is 1.0 mmHg. Tricuspid Valve: The tricuspid valve is normal in structure. Tricuspid valve regurgitation is moderate. Aortic Valve: The aortic valve is normal in structure. Aortic valve regurgitation is trivial. Aortic regurgitation PHT measures 331 msec. No aortic stenosis is present. Aortic valve mean gradient measures 3.0 mmHg. Aortic valve peak gradient measures 5.1  mmHg. Aortic valve area, by VTI measures 0.89 cm. Pulmonic Valve: The pulmonic valve was normal in structure. Pulmonic valve regurgitation is not visualized. Aorta: The aortic root is normal in size and structure. Venous: The inferior vena cava is normal in size with greater than 50% respiratory variability, suggesting right atrial pressure of 3 mmHg. IAS/Shunts: No atrial level shunt detected by color flow Doppler.  LEFT VENTRICLE PLAX 2D LVIDd:         5.00 cm     Diastology LVIDs:         4.40 cm     LV e' medial:  3.45 cm/s LV PW:         0.90 cm     LV E/e' medial:  9.6 LV IVS:        1.30 cm     LV e' lateral:   3.75 cm/s LVOT diam:     1.70 cm     LV E/e' lateral: 8.8 LV SV:         15 LV SV  Index:   9 LVOT Area:     2.27 cm  LV Volumes (MOD) LV vol d, MOD A2C: 83.5 ml LV vol d, MOD A4C: 91.6 ml LV vol s, MOD A2C: 57.5 ml LV vol s, MOD A4C: 69.7 ml LV SV MOD A2C:     26.0 ml LV SV MOD A4C:     91.6 ml LV SV MOD BP:      22.8 ml RIGHT VENTRICLE RV Basal diam:  2.90 cm RV Mid diam:    2.20 cm RV S prime:     11.90 cm/s TAPSE (M-mode): 1.8 cm LEFT ATRIUM             Index        RIGHT ATRIUM           Index LA diam:        3.20 cm 1.77 cm/m   RA Area:     14.00 cm LA Vol (A2C):   51.1 ml 28.31 ml/m  RA Volume:   36.00 ml  19.94 ml/m LA Vol (A4C):   35.8 ml 19.83 ml/m LA Biplane Vol: 43.9 ml 24.32 ml/m  AORTIC VALVE                    PULMONIC VALVE AV Area (Vmax):    0.93 cm     PV Vmax:          0.75 m/s AV Area (Vmean):   0.88 cm     PV Peak grad:     2.3 mmHg AV Area (VTI):     0.89 cm     PR End Diast Vel: 2.57 msec AV Vmax:           113.00 cm/s  RVOT Peak grad:   1 mmHg AV Vmean:          82.900 cm/s AV VTI:            0.172 m AV Peak Grad:      5.1 mmHg AV Mean Grad:      3.0 mmHg LVOT Vmax:         46.50 cm/s LVOT Vmean:        32.100 cm/s LVOT VTI:          0.068 m LVOT/AV VTI ratio: 0.39 AI PHT:            331 msec  AORTA Ao Root diam: 2.40 cm Ao Asc diam:  3.40 cm MITRAL VALVE               TRICUSPID VALVE MV Area (PHT): 7.44 cm    TR Peak grad:   36.0 mmHg MV Area VTI:   1.23 cm    TR Vmax:        300.00 cm/s MV Peak grad:  2.7 mmHg MV Mean grad:  1.0 mmHg    SHUNTS MV Vmax:       0.82 m/s    Systemic VTI:  0.07 m MV Vmean:      50.0 cm/s   Systemic Diam: 1.70  cm MV Decel Time: 102 msec MV E velocity: 33.00 cm/s MV A velocity: 82.80 cm/s MV E/A ratio:  0.40 MV A Prime:    9.5 cm/s Sabina Custovic Electronically signed by Isabell Manzanilla Signature Date/Time: 06/04/2023/11:16:48 AM    Final    DG Lumbar Spine 2-3 Views Result Date: 06/04/2023 CLINICAL DATA:  Fall.  Pain. EXAM: LUMBAR SPINE - 2-3 VIEW COMPARISON:  Lumbar spine radiographs 06/14/2010 FINDINGS: There is extensive  transpedicular rod and screw fusion hardware from the lower thoracic spine through the S1 level. Associated L3-4 and L5-S1 intervertebral disc spacers. Grade 1 anterolisthesis of L3 on L4 is better seen on prior CT. Vertebral body heights appear maintained. Moderate multilevel native disc space narrowing. Within the limitations of low bone mineralization, no definite acute fracture is seen. Surgical clips overlie the pelvis. IMPRESSION: 1. Extensive transpedicular rod and screw fusion hardware from the lower thoracic spine through the S1 level. 2. No definite acute fracture is seen. Electronically Signed   By: Bertina Broccoli M.D.   On: 06/04/2023 09:55   DG Knee 1-2 Views Right Result Date: 06/04/2023 CLINICAL DATA:  Fall. EXAM: RIGHT KNEE - 1-2 VIEW COMPARISON:  Right knee radiographs 03/31/2011 FINDINGS: Interval total right knee arthroplasty. No perihardware lucency is seen to indicate hardware failure or loosening. There is diffuse decreased bone mineralization. Within this limitation, no acute fracture is seen. Possible tiny joint effusion. Mild to moderate atherosclerotic calcifications. IMPRESSION: 1. Interval total right knee arthroplasty without evidence of hardware failure. 2. No acute fracture. Electronically Signed   By: Bertina Broccoli M.D.   On: 06/04/2023 09:53   DG Ankle 2 Views Right Result Date: 06/04/2023 CLINICAL DATA:  Fall.  Pain. EXAM: RIGHT ANKLE - 2 VIEW COMPARISON:  None Available. FINDINGS: There is diffuse decreased bone mineralization. There is a transverse fracture of the superior aspect of the medial malleolus with up to 3 mm lateral displacement of the distal fracture component with respect to the proximal fracture component. Likely old healed fracture of the distal fibular diaphysis with minimal cortical step-off on lateral view. The ankle mortise is symmetric and intact. IMPRESSION: 1. Acute mildly displaced fracture of the medial malleolus. 2. Likely old healed fracture of the  distal fibular diaphysis. Electronically Signed   By: Bertina Broccoli M.D.   On: 06/04/2023 09:52   DG Ankle 2 Views Left Result Date: 06/04/2023 CLINICAL DATA:  Fall.  Pain. EXAM: LEFT ANKLE - 2 VIEW COMPARISON:  None Available. FINDINGS: There is diffuse decreased bone mineralization. The ankle mortise is symmetric and intact. Mild distal medial and lateral malleolar degenerative spurs. No acute fracture is seen. No dislocation. IMPRESSION: 1. No acute fracture. 2. Mild distal medial and lateral malleolar degenerative spurs. Electronically Signed   By: Bertina Broccoli M.D.   On: 06/04/2023 09:19   DG Knee 1-2 Views Left Result Date: 06/04/2023 CLINICAL DATA:  Fall.  Pain. EXAM: LEFT KNEE - 1-2 VIEW COMPARISON:  None Available. FINDINGS: Status post total left knee arthroplasty. No perihardware lucency is seen to indicate hardware failure or loosening. No joint effusion. No acute fracture is seen. No dislocation. IMPRESSION: Status post total left knee arthroplasty without evidence of hardware failure. Electronically Signed   By: Bertina Broccoli M.D.   On: 06/04/2023 09:04   CT Cervical Spine Wo Contrast Result Date: 06/03/2023 CLINICAL DATA:  Fall with trauma to the head and neck. EXAM: CT CERVICAL SPINE WITHOUT CONTRAST TECHNIQUE: Multidetector CT imaging of the cervical spine was performed  without intravenous contrast. Multiplanar CT image reconstructions were also generated. RADIATION DOSE REDUCTION: This exam was performed according to the departmental dose-optimization program which includes automated exposure control, adjustment of the mA and/or kV according to patient size and/or use of iterative reconstruction technique. COMPARISON:  02/25/2022 FINDINGS: Alignment: Chronic degenerative anterolisthesis at C3-4 of 4 mm. Chronic degenerative anterolisthesis at C7-T1 of 4 mm. Skull base and vertebrae: No regional fracture or focal bone lesion. Chronic auto fusion at the C2-3 level. Chronic surgical fusion  from C5 through C7. Fusion also at the C 4 5 level could be auto fusion or surgical fusion but without hardware. Soft tissues and spinal canal: No traumatic soft tissue finding. Disc levels:  No stenosis at C2-3 or above. C3-4: Advanced facet arthropathy with 4 mm of degenerative anterolisthesis. Right foraminal stenosis could affect the right C4 nerve. No compressive stenosis in the region from C4-C7. Advanced facet arthropathy at C7-T1 with 4 mm of degenerative anterolisthesis. Potential for compression or irritation of either C8 nerve. Upper chest: Negative Other: None IMPRESSION: 1. No acute or traumatic finding. Chronic auto fusion at C2-3. Chronic surgical fusion from C5 through C7. Fusion also at the C4-5 level could be auto fusion or surgical fusion but without hardware. 2. Advanced facet arthropathy at C3-4 with 4 mm of degenerative anterolisthesis. Right foraminal stenosis could affect the right C4 nerve. 3. Advanced facet arthropathy at C7-T1 with 4 mm of degenerative anterolisthesis. Potential for compression or irritation of either C8 nerve. Electronically Signed   By: Bettylou Brunner M.D.   On: 06/03/2023 11:38   CT HEAD WO CONTRAST ( ) Result Date: 06/03/2023 CLINICAL DATA:  Fall with trauma to the head EXAM: CT HEAD WITHOUT CONTRAST TECHNIQUE: Contiguous axial images were obtained from the base of the skull through the vertex without intravenous contrast. RADIATION DOSE REDUCTION: This exam was performed according to the departmental dose-optimization program which includes automated exposure control, adjustment of the mA and/or kV according to patient size and/or use of iterative reconstruction technique. COMPARISON:  02/25/2022 FINDINGS: Brain: Age related volume loss. Chronic small-vessel ischemic changes of the cerebral hemispheric white matter. 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 No sign of acute infarction, mass lesion, hemorrhage, hydrocephalus or extra-axial collection. Vascular:  There is atherosclerotic calcification of the major vessels at the base of the brain. Skull: Negative Sinuses/Orbits: Clear/normal Other: None IMPRESSION: No acute or traumatic finding. Age related volume loss. Chronic small-vessel ischemic changes of the cerebral hemispheric white matter. 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 Electronically Signed   By: Bettylou Brunner M.D.   On: 06/03/2023 11:35   DG Pelvis 1-2 Views Result Date: 06/03/2023 CLINICAL DATA:  Recent fall EXAM: PELVIS - 1-2 VIEW COMPARISON:  CT 04/06/2022 FINDINGS: Distant lumbosacral fusion procedure. Surgical clips overlie the central pelvis. No evidence of acute regional fracture. Chronic sacroiliac degenerative changes are noted. IMPRESSION: No acute or traumatic finding. Distant lumbosacral fusion procedure. Chronic sacroiliac degenerative changes. Electronically Signed   By: Bettylou Brunner M.D.   On: 06/03/2023 11:32   DG Chest Portable 1 View Result Date: 06/03/2023 CLINICAL DATA:  Shortness of breath. EXAM: PORTABLE CHEST 1 VIEW COMPARISON:  01/02/2012 FINDINGS: The heart size and mediastinal contours are unremarkable. Low lung volumes with asymmetric elevation of right hemidiaphragm. No significant pleural effusion. Atelectasis noted within the left base. Postoperative changes noted within the lumbar spine and lower cervical vertebra. IMPRESSION: Low lung volumes with left base atelectasis. Electronically Signed   By: Carolynne Citron  Martin Slay M.D.   On: 06/03/2023 11:02    ECHO LVEF <20%  TELEMETRY reviewed by me  06/05/2023 : Sinus rhythm, rate 70s  EKG reviewed by me: Sinus tachycardia with rate of 102 bpm, prolonged QTc at 516   Data reviewed by me  06/05/2023: last 24h vitals tele labs imaging I/O provider notes  Principal Problem:   NSTEMI (non-ST elevated myocardial infarction) 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 is an 83 year old female with history of coronary artery disease s/p stent placement, hypertension, hyperlipidemia, chronic back pain, hypothyroid, who presents emergency department for chief concerns of a fall at home. Patient had been laying on the floor at home for an unknown amount of time coming in with significant rhabdo and troponin elevation peaking at 10,000, concerning for NSTEMI type 1 vs. Type 2. CK peaked at 36,000 and CKMB at 240.   # CVA, left occipital region # Rhabdomyolysis # NSTEMI Type I vs Type II # Hypertension # Hyperlipidemia # AKI Patient presented to the ED after a fall for an unknown amount of time with rhabdo and elevated troponins. Trops elevated and trended 3,186 > 10,962 > 10,573 > 8,592. CK total 6,330 > 36,602 > 12,945. CKMB 240. BNP 1,009. Echo this admission with newly reduced EF at < 20%, LV global hypokinesis, moderate TR, mild MR. EKG in ED with Sinus tachycardia with rate of 102 bpm, prolonged QTc at 516 with no significant ST changes. At 4 AM code stroke was called after signs of stroke-like symptoms and MRI brain revealed ischemia of left occipital lobe. Heparin  was paused at this time per neurology. This AM patient is sleeping and obtunded. BP are borderline and HR stable.  -Continue with IVF hydration. Patient is not in CHF and can be hydrated liberally despite reduced LVEF. -Trops are flat at this point. 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 -Will hold off on plan for cardiac cath until  her kidneys and rhabdo improve. -Heparin  gtt held per neurology.  -Can start metoprolol succinate if BP/HR allow. -Hold ACE-I/ARB and statin until kidneys/rhabdo improve. Avoid nephrotoxic meds at this time. -Hold starting SGLT2i for now. -Maintain on telemetry. -Optimize lytes, K>4 and Mag>2. -May need to be d/c with Life Vest due to newly reduced EF of < 20%.  -Palliative care consulted.    Signed: Antonette Batters, MD 06/05/2023, 6:01 PM [facility]

## 2023-06-05 NOTE — Assessment & Plan Note (Signed)
 Likely secondary to fall.  Patient had history of multiple fractures in the past-imaging was done due to her complaint of bilateral pain but right more than left.  Shows mildly displaced fracture of right medial malleolus. 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 -Supportive care with pain control

## 2023-06-05 NOTE — Progress Notes (Addendum)
 0825 - Pt escorted down to MRI by this RN.  [phone]- Pt very agitated in MRI, moving legs and left arm. Dr. Ariel Begun aware received verbal orders for ativan . 0900-pt back in room, VSS at this time.

## 2023-06-05 NOTE — Consult Note (Signed)
 PHARMACY - ANTICOAGULATION CONSULT NOTE  Pharmacy Consult for heparin  infusion Indication: chest pain/ACS  Allergies  Allergen Reactions   Ciprofloxacin    Penicillins Hives          Patient Measurements: Height: 5\' 1"  (154.9 cm) Weight: 84.5 kg (186 lb 3.2 oz) IBW/kg (Calculated) : 47.8 HEPARIN  DW (KG): 66.3 Heparin  wt: 66.3 kg  Vital Signs: Temp: 97.6 F (36.4 C) (04/21 0502) Temp Source: Oral (04/21 0502) BP: 143/73 (04/21 0502) Pulse Rate: 89 (04/21 0502)  Labs: Recent Labs    06/03/23 0909 06/03/23 1257 06/03/23 2100 06/04/23 0506 06/04/23 0901 06/04/23 1026 06/05/23 0437  HGB 13.6  --   --  12.7  --   --  11.9*  HCT 41.2  --   --  38.2  --   --  37.3  PLT 291  --   --  245  --   --  207  APTT  --  26  --   --   --   --  48*  LABPROT  --  15.1  --   --   --   --  14.6  INR  --  1.2  --   --   --   --  1.1  HEPARINUNFRC  --   --  0.45 0.37  --   --  0.17*  CREATININE 1.47*  --   --  1.52*  --   --   --   CKTOTAL 6,330*  --   --  16,109*  --   --   --   CKMB  --   --   --   --   --  239.8*  --   TROPONINIHS 3,186*  --   --   --  60,454* 10,573*  --     Estimated Creatinine Clearance: 27.2 mL/min (A) (by C-G formula based on SCr of 1.52 mg/dL (H)).   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, DCIS   Cataract    immature bilateral   Chronic back pain    Constipation    Coronary artery disease    Diverticulosis    GERD (gastroesophageal reflux disease)    takes a med daily-to bring with med name at surgery   History of migraine    hasn't had one in yrs-per pt Zoloft  stopped them   Hyperlipidemia    takes Crestor  and Niacin daily   Hypertension    takes Amlodipine   and Lisinopril  daily   Hypokalemia    takes K Dur tid   Hypothyroidism    takes Synthroid  daily   Insomnia    takes restoril  prn   Joint pain    Joint swelling    Peripheral edema    takes HCTZ daily    Personal history of radiation therapy 2014   F/U lumpectomy    Presence of pessary     Medications:  No home anticoagulation per pharmacist review  Assessment: 83 yo female presented to ED following an unwitnessed fall.  During evaluation EKG showed ST changes and  patient had elevated troponin.  Pharmacy consulted for initiation of heparin  infusion.  Baseline PT/INR and aPTT ordered    Goal of Therapy:  Heparin  level 0.3-0.7 units/ml Monitor platelets by anticoagulation protocol: Yes  4/19@2100 : HL 0.45, therapeutic x 1 4/20 0506 HL 0.37, therapeutic x 2 4/21 0437 HL 0.17, subtherapeutic   Plan:  Bolus 2000 units  x 1 Increase heparin  infusion to 1000 units/hr Recheck HL in 8 hrs after rate change Continue to monitor H&H and platelets  Coretta Dexter, PharmD, [facility] 06/05/2023 5:34 AM

## 2023-06-05 NOTE — Procedures (Addendum)
 History: 83 year old female who developed right-sided weakness and aphasia, concern for possible seizures  EEG duration 24 minutes  Sedation: None  Patient State: Awake and asleep  Technique: This EEG was acquired with electrodes placed according to the International 10-20 electrode system (including Fp1, Fp2, F3, F4, C3, C4, P3, P4, O1, O2, T3, T4, T5, T6, A1, A2, Fz, Cz, Pz). The following electrodes were missing or displaced: none.   Background: The predominance of the background consists of low voltage irregular slow activity with some superimposed frontally predominant beta activity.  With arousal, there is briefly seen a posterior dominant rhythm of 8 to 9 Hz which is better seen on the right than the left.  There are sleep spindles seen at times which are symmetric.  Photic stimulation: Physiologic driving is not performed  EEG Abnormalities: 1) asymmetric posterior dominant rhythm 2) generalized irregular slow activity  Clinical Interpretation: This EEG is consistent with a focal area of cerebral dysfunction in the left posterior quadrant in addition to a more generalized nonspecific cerebral dysfunction (encephalopathy).   There was no seizure or seizure predisposition recorded on this study. Please note that lack of epileptiform activity on EEG does not preclude the possibility of epilepsy.   Ann Keto, MD [facility]   If 7pm- 7am, please page neurology on call as listed in AMION.

## 2023-06-05 NOTE — Progress Notes (Addendum)
 Progress Note   Patient: Laurie Hale WUJ:[id number] DOB: 07/17/1940 DOA: 06/03/2023     2 DOS: the patient was seen and examined on 06/05/2023   Brief hospital course: Ms. Jilliane Kazanjian is an 83 year old female with history of hypertension, hyperlipidemia, chronic back pain, hypothyroid, who presents emergency department for chief concerns of a fall at home.  Patient had been laying on the floor at home for an unknown amount of time. Patient could not answer how she fell, last known well was a night prior. She was complaining of progressive generalized weakness for about a week  Per ED nursing documentation, EMS gave patient aspirin  325 mg p.o. one-time dose, sodium chloride  500 mL liter bolus.  Vitals in the ED showed T of 97.6, rr 22, hr 100, blood pressure 113/74, SpO2 95% on 2 L nasal cannula.  Serum sodium is 138, potassium 4.0, chloride 104, bicarb 21, BUN of 33, serum creatinine 1.47, EGFR 35, nonfasting blood glucose 123, WBC 17.8, hemoglobin 13.6, platelets of 291.  CK was elevated at 6330. High sensitive troponin was 3186.  COVID/influenza A/influenza B/RSV PCR were negative.  ED treatment: Fentanyl  50 mcg IV, 2 doses were given, heparin  bolus and gtt. were initiated, sodium chloride  1 L bolus.  4/20: Vital stable, overnight significant bilateral leg pain and an ankle abrasion noted, imaging of lumbar spine and bilateral lower extremities Shows a mildly displaced fracture of medial malleolus. UA with likely myoglobin, and rare bacteria.  Improving leukocytosis now at 11, bicarb of 20, BUN 35 with slight worsening of creatinine to 1.52-baseline seems to be normal.  Worsening CK and troponin.  Patient seems very restless although complaining more about leg pain. 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  CK-MB was elevated at 239.8  4/21: Code stroke was activated overnight when patient was found aphasic, right-sided weakness,  right facial droop and gaze preference.  CT head was without any acute abnormality.  CTA head and neck was negative for large vessel occlusion but did show high-grade stenosis/near occlusion of the left PCA P2 segment and also shows an abnormal oligemia and possible small infarct.  Neurology evaluated her and her symptoms did not match to those findings.  Unwitnessed seizure was another consideration so she was loaded with Keppra , heparin  was held and patient was transferred to stepdown for close monitoring. MRI brain was positive for stroke. Labs with resolved leukocytosis, creatinine continue to get worse now at 1.67, improving CK now at 12,935, troponin started trending down at 8592. ABI was normal bilaterally Neurology stopped Keppra , holding heparin  infusion.  Patient with multiorgan dysfunction and critically ill-palliative care was also consulted.  She is high risk for mortality.  Assessment and Plan: * NSTEMI (non-ST elevated myocardial infarction) [facility]) Patient denies any chest pain and she was more concentrated on leg pain.  Appears very restless. Echocardiogram with new diagnosis of HFrEF with EF less than 20% and global hypokinesis.  Troponin peaked at 10,962 CK-MB also elevated Cardiology is on board- -holding heparin  infusion due to recent stroke -Appreciate cardiology recommendations  Acute CVA (cerebrovascular accident) [facility]) Code stroke was activated overnight when patient was found to be aphasic and right-sided hemiparesis.  Initial CT head was negative but MRI did show moderate-sized stroke. Neurology was consulted. - Holding heparin  infusion as she is high risk for bleed -EEG was done-pending results -Did receive initial dose of Keppra  which was later discontinued after positive MRI  Acute heart failure  with reduced ejection fraction (HFrEF, <= 40%) (HCC) Echocardiogram with new diagnosis of acute HFrEF with EF less than 20% and global hypokinesis.  Prior echo was done in  October 2020 and it was normal.  BNP elevated at 1009. Clinically appears euvolemic - Management per cardiology - Monitor volume status closely as giving some IV fluid due to rhabdomyolysis.  She will remain high risk for a flash pulmonary edema   Rhabdomyolysis CK started improving, currently at 12,935 Patient was found on ground-unknown time.  And she could not recall any thing related to fall and how long she was there. - Continue with IV fluid -Monitor CK levels -Holding statin  Bilateral leg pain Especially with walking ABI normal - Ordered Dilaudid  as morphine  was not helping and patient was very restless due to pain  Displaced fracture of medial malleolus of right tibia, initial encounter for closed fracture Likely secondary to fall.  Patient had history of multiple fractures in the past-imaging was done due to her complaint of bilateral pain but right more than left.  Shows mildly displaced fracture of right medial malleolus. 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 -Supportive care with pain control  AKI (acute kidney injury) (HCC) Worsening creatinine, currently at 1.  6 7.  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  Renal ultrasound without any significant abnormality.  Likely multifactorial with ATN due to hypoxia and NSTEMI plus rhabdomyolysis -Continue with IV fluid -Monitor renal function -Avoid nephrotoxins  Essential hypertension, benign Blood pressure borderline soft this morning - Holding home amlodipine  due to hypotension -Keep holding lisinopril  and HCTZ as patient also has AKI -Continue to monitor  Hypercholesterolemia - Holding home statin due to rhabdomyolysis  Hypothyroidism Levothyroxine  88 mcg daily resumed   Obesity (BMI 30-39.9) Estimated body mass index is 33.97 kg/m as calculated from the following:   Height as of this encounter: 5\' 1"  (1.549 m).    Weight as of this encounter: 81.6 kg.   This complicates overall care and prognosis.   Depression, recurrent (HCC) Home sertraline  150 mg daily resumed    Subjective: Patient was sedated and was not really responding when seen with neurology today.  She received Ativan  earlier to complete the MRI brain.  Physical Exam: Vitals:   06/05/23 1300 06/05/23 1330 06/05/23 1400 06/05/23 1430  BP: 113/60 (!) 148/59 (!) 202/179   Pulse: 69 76 86 83  Resp: 12 14 18 17   Temp:      TempSrc:      SpO2: 91% 100% 100% 100%  Weight:      Height:       General.  Chronically ill-appearing, sedated lady, in no acute distress. Pulmonary.  Lungs clear bilaterally, normal respiratory effort. CV.  Regular rate and rhythm, no JVD, rub or murmur. Abdomen.  Soft, nontender, nondistended, BS positive. CNS.  Sedated, not following commands, seems like having right hemiparesis. Extremities.  No edema, no cyanosis, pulses intact and symmetrical.  Data Reviewed: Prior data reviewed  Family Communication: Discussed with son and daughter on phone  Disposition: Status is: Inpatient Remains inpatient appropriate because: Severity of illness  Planned Discharge Destination:  To be determined  Time spent: 60 minutes  CRITICAL CARE Performed by: [name]  Patient is critically ill with multiorgan dysfunction and critical illnesses involving vital organs.  Total critical care time: 60 minutes  Critical care time was exclusive of separately  billable procedures and treating other patients.  Critical care was necessary to treat or prevent imminent or life-threatening deterioration.  Critical care was time spent personally by me on the following activities: development of treatment plan with patient and/or surrogate as well as nursing, discussions with consultants, evaluation of patient's response to treatment, examination of patient, obtaining history from patient or surrogate, ordering and performing  treatments and interventions, ordering and review of laboratory studies, ordering and review of radiographic studies, pulse oximetry and re-evaluation of patient's condition.   Author: [name], MD 06/05/2023 2:54 PM  For on call review [email or web address].

## 2023-06-05 NOTE — Progress Notes (Addendum)
   CROSS COVER NOTE  Patient name: Laurie Hale MRN: [id number] DOB : 31-Jul-1940  Concern as stated by nurse / staff   Rapid response called followed by code stroke  Review of Pertinent findings: Patient found by nurse to have facial droop and UE weakness with lethargy, per report. Vital signs remained stable. Of note, was awaken from sleep.  Stat head CT and teleneuro evaluation performed.  IMPRESSION: 1. No acute cortically based infarct or acute intracranial hemorrhage identified. ASPECTS 10. 2. Stable non contrast CT appearance of chronic small vessel disease.  Assessment and  Interventions   Assessment:at this time vitals remain stable, symptoms from report have improved. Head CT negative. Awaiting teleneuro recommendations.   Plan: Return to room. Frequent neuro monitoring. Follow up further evaluation per neuro recs.       UPDATE: Head CT negative for acute stroke.  CTA positive for stenosis but no LVO consistent with presenting symptoms.  Neurology has evaluated and recommends empirically treating for atypical seizure with loading Keppra  and Ativan  at this time.  Will also undergo further evaluation with EEG and brain MRI to further rule out seizure versus stroke. Transferring patient to stepdown.

## 2023-06-05 NOTE — Progress Notes (Addendum)
 1700 Report received from [facility]. Patient non interactive. Responds to pain. Dr. Ariel Begun in to talk with family per family request. 1748 Medicated for pain.

## 2023-06-05 NOTE — Progress Notes (Signed)
 Family requesting to speak to Dr. Ariel Begun. Dr. Ariel Begun made aware.

## 2023-06-05 NOTE — Assessment & Plan Note (Signed)
 Code stroke was activated overnight when patient was found to be aphasic and right-sided hemiparesis.  Initial CT head was negative but MRI did show moderate-sized stroke. Neurology was consulted. - Holding heparin  infusion as she is high risk for bleed -EEG was done-pending results -Did receive initial dose of Keppra  which was later discontinued after positive MRI

## 2023-06-05 NOTE — Assessment & Plan Note (Signed)
 CK started improving, currently at 12,935 Patient was found on ground-unknown time.  And she could not recall any thing related to fall and how long she was there. - Continue with IV fluid -Monitor CK levels -Holding statin

## 2023-06-06 ENCOUNTER — Inpatient Hospital Stay

## 2023-06-06 DIAGNOSIS — I214 Non-ST elevation (NSTEMI) myocardial infarction: Secondary | ICD-10-CM | POA: Diagnosis not present

## 2023-06-06 DIAGNOSIS — Z7189 Other specified counseling: Secondary | ICD-10-CM | POA: Diagnosis not present

## 2023-06-06 DIAGNOSIS — Z66 Do not resuscitate: Secondary | ICD-10-CM

## 2023-06-06 DIAGNOSIS — M79604 Pain in right leg: Secondary | ICD-10-CM | POA: Diagnosis not present

## 2023-06-06 DIAGNOSIS — Z789 Other specified health status: Secondary | ICD-10-CM

## 2023-06-06 DIAGNOSIS — M6282 Rhabdomyolysis: Secondary | ICD-10-CM | POA: Diagnosis not present

## 2023-06-06 DIAGNOSIS — I5021 Acute systolic (congestive) heart failure: Secondary | ICD-10-CM | POA: Diagnosis not present

## 2023-06-06 LAB — RENAL FUNCTION PANEL
Albumin: 3.2 g/dL — ABNORMAL LOW (ref 3.5–5.0)
Anion gap: 8 (ref 5–15)
BUN: 30 mg/dL — ABNORMAL HIGH (ref 8–23)
CO2: 21 mmol/L — ABNORMAL LOW (ref 22–32)
Calcium: 8.4 mg/dL — ABNORMAL LOW (ref 8.9–10.3)
Chloride: 107 mmol/L (ref 98–111)
Creatinine, Ser: 1.23 mg/dL — ABNORMAL HIGH (ref 0.44–1.00)
GFR, Estimated: 43 mL/min — ABNORMAL LOW (ref >60)
Glucose, Bld: 126 mg/dL — ABNORMAL HIGH (ref 70–99)
Phosphorus: 2.9 mg/dL (ref 2.5–4.6)
Potassium: 4.5 mmol/L (ref 3.5–5.1)
Sodium: 136 mmol/L (ref 135–145)

## 2023-06-06 LAB — CBC
HCT: 33.9 % — ABNORMAL LOW (ref 36.0–46.0)
Hemoglobin: 10.9 g/dL — ABNORMAL LOW (ref 12.0–15.0)
MCH: 30.3 pg (ref 26.0–34.0)
MCHC: 32.2 g/dL (ref 30.0–36.0)
MCV: 94.2 fL (ref 80.0–100.0)
Platelets: 178 10*3/uL (ref 150–400)
RBC: 3.6 MIL/uL — ABNORMAL LOW (ref 3.87–5.11)
RDW: 14.2 % (ref 11.5–15.5)
WBC: 7.3 10*3/uL (ref 4.0–10.5)
nRBC: 0 % (ref 0.0–0.2)

## 2023-06-06 LAB — TROPONIN I (HIGH SENSITIVITY): Troponin I (High Sensitivity): 3223 ng/L (ref <18)

## 2023-06-06 LAB — GLUCOSE, CAPILLARY
Glucose-Capillary: 120 mg/dL — ABNORMAL HIGH (ref 70–99)
Glucose-Capillary: 129 mg/dL — ABNORMAL HIGH (ref 70–99)
Glucose-Capillary: 130 mg/dL — ABNORMAL HIGH (ref 70–99)

## 2023-06-06 LAB — MAGNESIUM: Magnesium: 2.3 mg/dL (ref 1.7–2.4)

## 2023-06-06 LAB — CK: Total CK: 12563 U/L — ABNORMAL HIGH (ref 38–234)

## 2023-06-06 MED ORDER — GLYCOPYRROLATE 0.2 MG/ML IJ SOLN
0.2000 mg | INTRAMUSCULAR | Status: DC | PRN
  Administered 2023-06-08: 0.2 mg via INTRAVENOUS
  Filled 2023-06-06: qty 1

## 2023-06-06 MED ORDER — FENTANYL CITRATE PF 50 MCG/ML IJ SOSY
50.0000 ug | PREFILLED_SYRINGE | INTRAMUSCULAR | Status: DC | PRN
  Administered 2023-06-06 (×3): 50 ug via INTRAVENOUS
  Filled 2023-06-06 (×4): qty 1

## 2023-06-06 MED ORDER — MORPHINE SULFATE (PF) 4 MG/ML IV SOLN
4.0000 mg | Freq: Once | INTRAVENOUS | Status: AC
  Administered 2023-06-06: 4 mg via INTRAVENOUS
  Filled 2023-06-06: qty 1

## 2023-06-06 MED ORDER — HALOPERIDOL LACTATE 2 MG/ML PO CONC: 0.5000 mg | ORAL | Status: DC | PRN

## 2023-06-06 MED ORDER — BIOTENE DRY MOUTH MT LIQD: 15.0000 mL | OROMUCOSAL | Status: DC | PRN

## 2023-06-06 MED ORDER — HALOPERIDOL 0.5 MG PO TABS: 0.5000 mg | ORAL_TABLET | ORAL | Status: DC | PRN

## 2023-06-06 MED ORDER — GLYCOPYRROLATE 0.2 MG/ML IJ SOLN: 0.2000 mg | INTRAMUSCULAR | Status: DC | PRN

## 2023-06-06 MED ORDER — LORAZEPAM 1 MG PO TABS: 1.0000 mg | ORAL_TABLET | ORAL | Status: DC | PRN

## 2023-06-06 MED ORDER — LORAZEPAM 2 MG/ML IJ SOLN
1.0000 mg | INTRAMUSCULAR | Status: DC | PRN
  Administered 2023-06-08: 1 mg via INTRAVENOUS
  Filled 2023-06-06: qty 1

## 2023-06-06 MED ORDER — LIDOCAINE 5 % EX PTCH
2.0000 | MEDICATED_PATCH | CUTANEOUS | Status: DC
  Administered 2023-06-06: 2 via TRANSDERMAL
  Filled 2023-06-06 (×3): qty 2

## 2023-06-06 MED ORDER — HALOPERIDOL LACTATE 5 MG/ML IJ SOLN
0.5000 mg | INTRAMUSCULAR | Status: DC | PRN
  Administered 2023-06-08: 0.5 mg via INTRAVENOUS
  Filled 2023-06-06 (×2): qty 1

## 2023-06-06 MED ORDER — STROKE: EARLY STAGES OF RECOVERY BOOK: Freq: Once | Status: DC

## 2023-06-06 MED ORDER — GLYCOPYRROLATE 1 MG PO TABS: 1.0000 mg | ORAL_TABLET | ORAL | Status: DC | PRN

## 2023-06-06 MED ORDER — LORAZEPAM 2 MG/ML PO CONC: 1.0000 mg | ORAL | Status: DC | PRN

## 2023-06-06 MED ORDER — HYDROMORPHONE HCL 1 MG/ML IJ SOLN
0.5000 mg | INTRAMUSCULAR | Status: DC | PRN
  Administered 2023-06-06 – 2023-06-07 (×4): 2 mg via INTRAVENOUS
  Filled 2023-06-06 (×5): qty 2

## 2023-06-06 MED ORDER — POLYVINYL ALCOHOL 1.4 % OP SOLN: 1.0000 [drp] | Freq: Four times a day (QID) | OPHTHALMIC | Status: DC | PRN

## 2023-06-06 NOTE — Progress Notes (Signed)
 [facility] CLINIC CARDIOLOGY PROGRESS NOTE       Patient ID: Laurie Hale MRN: [id number] DOB/AGE: 1940-03-04 83 y.o.  Admit date: 06/03/2023 Referring Physician Dr Reinhold Carbine Primary Physician Rory Collard, MD Primary Cardiologist Bary Likes - 5 years ago (retired) Reason for Consultation NSTEMI/rhabdo  HPI: Laurie Hale is an 83 year old female with history of coronary artery disease s/p stent placement, hypertension, hyperlipidemia, chronic back pain, hypothyroid, who presents emergency department for chief concerns of a fall at home. Patient had been laying on the floor at home for an unknown amount of time coming in with significant rhabdo and troponin elevation peaking at 10,000, concerning for NSTEMI type 1 vs. Type 2. CK peaked at 36,000 and CKMB at 240.   Interval History: -During the time of my exam, patient is resting in hospital bed. Arouses to my presence but only grunts. Minimally interactive, does not answer questions.  -MRI yesterday with acute stroke. -BP elevated, HR controlled.   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, DCIS   Cataract    immature bilateral   Chronic back pain    Constipation    Coronary artery disease    Diverticulosis    GERD (gastroesophageal reflux disease)    takes a med daily-to bring with med name at surgery   History of migraine    hasn't had one in yrs-per pt Zoloft  stopped them   Hyperlipidemia    takes Crestor  and Niacin daily   Hypertension    takes Amlodipine   and Lisinopril  daily   Hypokalemia    takes K Dur tid   Hypothyroidism    takes Synthroid  daily   Insomnia    takes restoril  prn   Joint pain    Joint swelling    Peripheral edema    takes HCTZ daily   Personal history of radiation therapy 2014   F/U lumpectomy    Presence of pessary     Past Surgical History:  Procedure Laterality  Date   ABDOMINAL HYSTERECTOMY     BACK SURGERY  2009   BREAST BIOPSY Left 10/22/2012   Stereo - DCIS   BREAST LUMPECTOMY Left 2014   DCIS. F/U radiation    CARDIAC CATHETERIZATION  1999   CATARACT EXTRACTION W/PHACO Right 10/05/2015   Procedure: CATARACT EXTRACTION PHACO AND INTRAOCULAR LENS PLACEMENT (IOC);  Surgeon: Billee Buddle, MD;  Location: [facility];  Service: Ophthalmology;  Laterality: Right;  RIGHT   CATARACT EXTRACTION W/PHACO Left 11/16/2015   Procedure: CATARACT EXTRACTION PHACO AND INTRAOCULAR LENS PLACEMENT (IOC);  Surgeon: Billee Buddle, MD;  Location: [facility];  Service: Ophthalmology;  Laterality: Left;  LEFT   COLONOSCOPY     CORONARY ANGIOPLASTY     1 stent   partial coloectomy  1995   right knee arthroscopy  2013   TONSILLECTOMY     TOTAL KNEE ARTHROPLASTY  01/02/2012   RIGHT KNEE   TOTAL KNEE ARTHROPLASTY  01/02/2012   Procedure: TOTAL KNEE ARTHROPLASTY;  Surgeon: Christie Cox, MD;  Location: [facility] OR;  Service: Orthopedics;  Laterality: Right;   TOTAL KNEE ARTHROPLASTY Left 10/09/2017   Procedure: LEFT TOTAL KNEE ARTHROPLASTY;  Surgeon: Christie Cox, MD;  Location: [facility];  Service: Orthopedics;  Laterality: Left;  with block    Medications Prior to Admission  Medication Sig Dispense Refill Last  Dose/Taking   amLODipine  (NORVASC ) 10 MG tablet Take 1 tablet (10 mg total) by mouth daily. 30 tablet 1 06/02/2023   hydrochlorothiazide  (HYDRODIURIL ) 25 MG tablet Take by mouth.   06/02/2023   levothyroxine  (SYNTHROID , LEVOTHROID) 88 MCG tablet Take 88 mcg by mouth at bedtime.   06/02/2023   lisinopril  (PRINIVIL ,ZESTRIL ) 40 MG tablet Take 1 tablet (40 mg total) by mouth daily. 30 tablet 0 06/02/2023   rosuvastatin  (CRESTOR ) 20 MG tablet Take 20 mg by mouth every Monday, Wednesday, and Friday.   06/02/2023   sertraline  (ZOLOFT ) 100 MG tablet Take 150 mg by mouth daily.   06/02/2023   Social History   Socioeconomic History   Marital  status: Married    Spouse name: Not on file   Number of children: Not on file   Years of education: Not on file   Highest education level: Not on file  Occupational History   Not on file  Tobacco Use   Smoking status: Never   Smokeless tobacco: Never  Vaping Use   Vaping status: Not on file  Substance and Sexual Activity   Alcohol  use: No   Drug use: No   Sexual activity: Not Currently  Other Topics Concern   Not on file  Social History Narrative   Not on file   Social Drivers of Health   Financial Resource Strain: Patient Declined (10/21/2022)   Received from [facility]   Overall Financial Resource Strain (CARDIA)    Difficulty of Paying Living Expenses: Patient declined  Food Insecurity: No Food Insecurity (06/03/2023)   Hunger Vital Sign    Worried About Running Out of Food in the Last Year: Never true    Ran Out of Food in the Last Year: Never true  Transportation Needs: No Transportation Needs (06/03/2023)   PRAPARE - Administrator, Civil Service (Medical): No    Lack of Transportation (Non-Medical): No  Physical Activity: Not on file  Stress: Not on file  Social Connections: Unknown (06/03/2023)   Social Connection and Isolation Panel [NHANES]    Frequency of Communication with Friends and Family: Patient declined    Frequency of Social Gatherings with Friends and Family: Patient declined    Attends Religious Services: Patient declined    Database administrator or Organizations: Patient declined    Attends Banker Meetings: Patient declined    Marital Status: Widowed  Intimate Partner Violence: Not At Risk (06/03/2023)   Humiliation, Afraid, Rape, and Kick questionnaire    Fear of Current or Ex-Partner: No    Emotionally Abused: No    Physically Abused: No    Sexually Abused: No    Family History  Problem Relation Age of Onset   Hypertension Mother    Sudden death Sister        x2   Breast cancer Sister    Heart  disease Brother      Vitals:   06/06/23 0400 06/06/23 0500 06/06/23 0600 06/06/23 0700  BP: 125/63 (!) 141/131 (!) 144/69 (!) 155/92  Pulse: 65 (!) 53 86 79  Resp: 16 19 18 13   Temp:      TempSrc:      SpO2: 100% 97% 100% 100%  Weight:      Height:        PHYSICAL EXAM General: Minimally interactive, no acute distress. HEENT: Normocephalic and atraumatic. Neck: No JVD.  Lungs: Normal respiratory effort. Clear bilaterally to auscultation. No wheezes, crackles, rhonchi.  Heart:  HRRR. Normal S1 and S2 without gallops or murmurs.  Abdomen: Non-distended appearing.  Msk: Normal strength and tone for age. Extremities: Warm and well perfused. No clubbing, cyanosis. no edema.  Neuro: minimally interactive, does not respond to questions.   Labs: Basic Metabolic Panel: Recent Labs    06/05/23 0437 06/06/23 0411  NA 139 136  K 4.8 4.5  CL 108 107  CO2 22 21*  GLUCOSE 133* 126*  BUN 38* 30*  CREATININE 1.67* 1.23*  CALCIUM  8.4* 8.4*  MG  --  2.3  PHOS  --  2.9   Liver Function Tests: Recent Labs    06/06/23 0411  ALBUMIN 3.2*   No results for input(s): "LIPASE", "AMYLASE" in the last 72 hours. CBC: Recent Labs    06/05/23 0437 06/06/23 0411  WBC 8.0 7.3  HGB 11.9* 10.9*  HCT 37.3 33.9*  MCV 95.4 94.2  PLT 207 178   Cardiac Enzymes: Recent Labs    06/04/23 0506 06/04/23 0901 06/04/23 1026 06/05/23 0437 06/06/23 0411 06/06/23 0637  CKTOTAL 40,981*  --   --  12,935* 12,563*  --   CKMB  --   --  239.8*  --   --   --   TROPONINIHS  --    < > 10,573* 8,592*  --  3,223*   < > = values in this interval not displayed.   BNP: Recent Labs    06/04/23 0901  BNP 1,009.2*   D-Dimer: No results for input(s): "DDIMER" in the last 72 hours. Hemoglobin A1C: No results for input(s): "HGBA1C" in the last 72 hours. Fasting Lipid Panel: No results for input(s): "CHOL", "HDL", "LDLCALC", "TRIG", "CHOLHDL", "LDLDIRECT" in the last 72 hours. Thyroid  Function  Tests: No results for input(s): "TSH", "T4TOTAL", "T3FREE", "THYROIDAB" in the last 72 hours.  Invalid input(s): "FREET3" Anemia Panel: No results for input(s): "VITAMINB12", "FOLATE", "FERRITIN", "TIBC", "IRON ", "RETICCTPCT" in the last 72 hours.   Radiology: MR BRAIN W WO CONTRAST Result Date: 06/05/2023 CLINICAL DATA:  Acute neurologic deficit. Right-sided weakness and aphasia EXAM: MRI HEAD WITHOUT AND WITH CONTRAST TECHNIQUE: Multiplanar, multiecho pulse sequences of the brain and surrounding structures were obtained without and with intravenous contrast. CONTRAST:  8mL GADAVIST  GADOBUTROL  1 MMOL/ML IV SOLN COMPARISON:  CTA head neck 06/05/2023 FINDINGS: Brain: Wedge-shaped area of acute infarction within the posterior left MCA territory, predominantly affecting the parietal lobe. No acute or chronic hemorrhage. There is multifocal hyperintense T2-weighted signal within the white matter. Generalized volume loss. The midline structures are normal. Vascular: Normal flow voids. Skull and upper cervical spine: Normal calvarium and skull base. Visualized upper cervical spine and soft tissues are normal. Sinuses/Orbits:No paranasal sinus fluid levels or advanced mucosal thickening. No mastoid or middle ear effusion. Normal orbits. IMPRESSION: Wedge-shaped area of acute infarction within the posterior left MCA territory, predominantly affecting the parietal lobe. No hemorrhage or mass effect. Electronically Signed   By: Juanetta Nordmann M.D.   On: 06/05/2023 19:12   US  RENAL Result Date: 06/05/2023 CLINICAL DATA:  AKI EXAM: RENAL / URINARY TRACT ULTRASOUND COMPLETE COMPARISON:  None Available. FINDINGS: Right Kidney: Renal measurements: 10.7 x 4.9 x 9.2 cm = volume: 144 mL. Echogenicity within normal limits. No mass or hydronephrosis visualized. Left Kidney: Renal measurements: 10.2 x 4.5 x 4.8 cm = volume: 117 mL. Echogenicity within normal limits. No mass or hydronephrosis visualized. Bladder: Appears normal  for degree of bladder distention. Other: None. IMPRESSION: No collecting system dilatation. Electronically Signed   By: Alice Innocent  Venice Gillis M.D.   On: 06/05/2023 13:29   CT ANGIO HEAD NECK W WO CM W PERF (CODE STROKE) Result Date: 06/05/2023 CLINICAL DATA:  83 year old female code stroke presentation. EXAM: CT ANGIOGRAPHY HEAD AND NECK CT PERFUSION BRAIN TECHNIQUE: Multidetector CT imaging of the head and neck was performed using the standard protocol during bolus administration of intravenous contrast. Multiplanar CT image reconstructions and MIPs were obtained to evaluate the vascular anatomy. Carotid stenosis measurements (when applicable) are obtained utilizing NASCET criteria, using the distal internal carotid diameter as the denominator. Multiphase CT imaging of the brain was performed following IV bolus contrast injection. Subsequent parametric perfusion maps were calculated using RAPID software. RADIATION DOSE REDUCTION: This exam was performed according to the departmental dose-optimization program which includes automated exposure control, adjustment of the mA and/or kV according to patient size and/or use of iterative reconstruction technique. CONTRAST:  60mL OMNIPAQUE  IOHEXOL  350 MG/ML SOLN COMPARISON:  Plain head CT 0412 hours today. FINDINGS: CT Brain Perfusion Findings: Mildly motion degraded. CBF (<30%) Volume: 7mL Perfusion (Tmax>6.0s) volume: 6mL, with T-max greater than 10 seconds of 5 mL and hypoperfusion index of 0.8 Mismatch Volume: None apparent Infarction Location:Left PCA territory CTA NECK Skeleton: Cervical ACDF C4-C5 and C5-C6. Pseudoarthrosis at the latter. Underlying advanced cervical spine degeneration. Degenerative appearing C2-C3 ankylosis. No acute osseous abnormality identified. Upper chest: Layering right pleural effusion with simple fluid density appears to be transudate, up to moderate. Patchy right lung atelectasis associated. Lung apices relatively clear. Negative visible superior  mediastinum. Other neck: Retropharyngeal course of the carotids. Other nonvascular neck soft tissue spaces are within normal limits. Aortic arch: Calcified aortic atherosclerosis.  3 vessel arch. Right carotid system: Brachiocephalic artery plaque without stenosis. Mildly tortuous right CCA. Partially retropharyngeal course. Moderate calcified plaque at the left ICA origin and bulb but less than 50 % stenosis with respect to the distal vessel. Left carotid system: Minimal plaque at the left CCA origin. Mild to moderate soft and calcified plaque at the left carotid bifurcation, proximal left ICA without stenosis. Tortuous and retropharyngeal left ICA distal to the bulb. Vertebral arteries: Mild plaque at the right subclavian artery origin without stenosis. Calcified plaque at the right vertebral artery origin without stenosis. Additional right V1 mild calcified plaque and tortuosity without stenosis. Right vertebral artery is patent to the skull base. Proximal left subclavian artery soft and calcified plaque without stenosis. Calcified plaque at the left vertebral artery origin with mild stenosis on series 8, image 131. Tortuous left V1 segment. Codominant left vertebral artery is patent to the skull base without additional plaque. CTA HEAD Posterior circulation: Left vertebral V4 segment mild calcified plaque without stenosis to the vertebrobasilar junction. Right vertebral V4 segment mild to moderate calcified plaque with mild stenosis series 5, image 263. Patent right PICA origin and vertebrobasilar junction. Patent basilar artery, dominant appearing left AICA, SCA and left PCA origins. No basilar stenosis. Fetal type right PCA origin. Left posterior communicating artery diminutive or absent. Right PCA branches are patent with mild P2 segment irregularity, mild stenosis. There are tandem moderate to severe stenoses of the left PCA P2 segment (series 7, image 22), the worst visible on series 14, image 20 and  high-grade with near occlusion. However, there is maintained distal left PCA branch enhancement. Anterior circulation: Both ICA siphons are patent with calcified atherosclerosis. Left siphon calcified plaque is moderate, with mild stenosis at the anterior genu. Right siphon calcified plaque is moderate but there is no convincing right siphon stenosis. Right  posterior communicating artery origin is normal. Patent carotid termini, MCA and ACA origins. Anterior communicating artery, bilateral ACA branches are within normal limits. Left MCA M1 segment is tortuous. Left MCA bifurcation is patent without stenosis. Right MCA M1 segment and bifurcation are patent without stenosis. Bilateral MCA branches are within normal limits. Venous sinuses: Patent. Anatomic variants: Fetal right PCA origin.  Dominant left AICA. Review of the MIP images confirms the above findings IMPRESSION: 1. Negative for large vessel occlusion but Positive for High-grade Stenoses/near-occlusion of the Left PCA P2 segment, and that territory registers abnormal oligemia and possible small infarct core on CT Perfusion (7 mL). 2. Other atherosclerosis in the head and neck without hemodynamically significant stenosis. 3. Layering right pleural effusion with right lung atelectasis. Aortic Atherosclerosis (ICD10-I70.0). #1 discussed by telephone with Dr. Ilean Mall on 06/05/2023 at 05:29. He advises that on exam she has significant deficits of a left MCA syndrome, discordant with the findings here. But we did discuss the possibility of acute on chronic left thalamic ischemia (left thalamostriate artery territory) and/or seizure with Todd's paralysis. Electronically Signed   By: Marlise Simpers M.D.   On: 06/05/2023 05:35   CT HEAD CODE STROKE WO CONTRAST Result Date: 06/05/2023 CLINICAL DATA:  Code stroke. 83 year old female with loss of speech. Recent fall, head trauma. EXAM: CT HEAD WITHOUT CONTRAST TECHNIQUE: Contiguous axial images were obtained from the base  of the skull through the vertex without intravenous contrast. RADIATION DOSE REDUCTION: This exam was performed according to the departmental dose-optimization program which includes automated exposure control, adjustment of the mA and/or kV according to patient size and/or use of iterative reconstruction technique. COMPARISON:  Brain MRI 12/10/2014.  Head CT 06/03/2023. FINDINGS: Brain: Stable cerebral volume. 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 Dilated perivascular space of the inferior lentiform bilaterally, more apparent on the right today, but was present in 2016 (coronal image 30) and stable from head CT last year. No midline shift, ventriculomegaly, mass effect, evidence of mass lesion, intracranial hemorrhage or evidence of cortically based acute infarction. Vascular: Calcified atherosclerosis at the skull base. No suspicious intracranial vascular hyperdensity. Skull: Stable and intact. Sinuses/Orbits: Visualized paranasal sinuses and mastoids are stable and well aerated. Other: No acute orbit or scalp soft tissue finding. ASPECTS [facility] Stroke Program Early CT Score) Total score (0-10 with 10 being normal): 10 IMPRESSION: 1. No acute cortically based infarct or acute intracranial hemorrhage identified. ASPECTS 10. 2. Stable non contrast CT appearance of chronic small vessel disease. Electronically Signed   By: Marlise Simpers M.D.   On: 06/05/2023 04:23   US  ARTERIAL ABI (SCREENING LOWER EXTREMITY) Result Date: 06/04/2023 CLINICAL DATA:  40981 Leg pain [id number] Peripheral arterial disease (HCC) 130865 EXAM: NONINVASIVE PHYSIOLOGIC VASCULAR STUDY OF BILATERAL LOWER EXTREMITIES TECHNIQUE: Evaluation of both lower extremities were performed at rest, including calculation of ankle-brachial indices with single level Doppler, pressure and pulse volume recording. COMPARISON:  None Available. FINDINGS: Right ABI:  1.1 Left ABI:  1.0 Right Lower Extremity:  Normal arterial waveforms at the  ankle. Left Lower Extremity:  Normal arterial waveforms at the ankle. 1.0-1.4 Normal IMPRESSION: Normal bilateral resting ankle-brachial indices. Electronically Signed   By: Fernando Hoyer M.D.   On: 06/04/2023 15:02   DG Tibia/Fibula Right Result Date: 06/04/2023 CLINICAL DATA:  Elwin Hammond no fracture. EXAM: RIGHT TIBIA AND FIBULA - 2 VIEW COMPARISON:  Right ankle radiographs 06/04/2023 FINDINGS: Redemonstration of transverse fracture of the superior medial malleolus as seen on right  ankle radiographs earlier same day. Status post total right knee arthroplasty. No perihardware lucency is seen. No acute fracture is seen within the more proximal aspect of the tibia or fibula. Redemonstration of likely old healed fracture of the distal fibular diaphysis and metaphysis. IMPRESSION: 1. Redemonstration of transverse fracture of the superior medial malleolus as seen on right ankle radiographs earlier same day. 2. No acute fracture is seen within the more proximal aspect of the tibia or fibula. Electronically Signed   By: Bertina Broccoli M.D.   On: 06/04/2023 14:27   ECHOCARDIOGRAM COMPLETE Result Date: 06/04/2023    ECHOCARDIOGRAM REPORT   Patient Name:   Laurie Hale Date of Exam: 06/03/2023 Medical Rec #:  [id number]   Height:       61.0 in Accession #:    [id number]  Weight:       179.8 lb Date of Birth:  Oct 12, 1940   BSA:          1.805 m Patient Age:    82 years    BP:           121/89 mmHg Patient Gender: F           HR:           89 bpm. Exam Location:  [facility] Procedure: 2D Echo, Cardiac Doppler, Color Doppler and Strain Analysis (Both            Spectral and Color Flow Doppler were utilized during procedure). Indications:     NSTEMI I21.4  History:         Patient has no prior history of Echocardiogram examinations.                  CAD; Risk Factors:Hypertension.  Sonographer:     Kathaleen Pale Roar Referring Phys:  [id number] AMY N COX Diagnosing Phys: Lanell Pinta Custovic  Sonographer Comments: Global longitudinal strain was  attempted. IMPRESSIONS  1. Left ventricular ejection fraction, by estimation, is <20%. The left ventricle has severely decreased function. The left ventricle demonstrates global hypokinesis. The left ventricular internal cavity size was mildly to moderately dilated. Left ventricular diastolic parameters are consistent with Grade I diastolic dysfunction (impaired relaxation).  2. Right ventricular systolic function is normal. The right ventricular size is normal.  3. The mitral valve is normal in structure. Mild mitral valve regurgitation. No evidence of mitral stenosis.  4. Tricuspid valve regurgitation is moderate.  5. The aortic valve is normal in structure. Aortic valve regurgitation is trivial. No aortic stenosis is present.  6. The inferior vena cava is normal in size with greater than 50% respiratory variability, suggesting right atrial pressure of 3 mmHg. FINDINGS  Left Ventricle: Left ventricular ejection fraction, by estimation, is <20%. The left ventricle has severely decreased function. The left ventricle demonstrates global hypokinesis. The left ventricular internal cavity size was mildly to moderately dilated. There is no left ventricular hypertrophy. Left ventricular diastolic parameters are consistent with Grade I diastolic dysfunction (impaired relaxation). Right Ventricle: The right ventricular size is normal. No increase in right ventricular wall thickness. Right ventricular systolic function is normal. Left Atrium: Left atrial size was normal in size. Right Atrium: Right atrial size was normal in size. Pericardium: There is no evidence of pericardial effusion. Mitral Valve: The mitral valve is normal in structure. Mild mitral valve regurgitation. No evidence of mitral valve stenosis. MV peak gradient, 2.7 mmHg. The mean mitral valve gradient is 1.0 mmHg. Tricuspid Valve: The tricuspid valve is normal in structure. Tricuspid  valve regurgitation is moderate. Aortic Valve: The aortic valve is normal  in structure. Aortic valve regurgitation is trivial. Aortic regurgitation PHT measures 331 msec. No aortic stenosis is present. Aortic valve mean gradient measures 3.0 mmHg. Aortic valve peak gradient measures 5.1  mmHg. Aortic valve area, by VTI measures 0.89 cm. Pulmonic Valve: The pulmonic valve was normal in structure. Pulmonic valve regurgitation is not visualized. Aorta: The aortic root is normal in size and structure. Venous: The inferior vena cava is normal in size with greater than 50% respiratory variability, suggesting right atrial pressure of 3 mmHg. IAS/Shunts: No atrial level shunt detected by color flow Doppler.  LEFT VENTRICLE PLAX 2D LVIDd:         5.00 cm     Diastology LVIDs:         4.40 cm     LV e' medial:    3.45 cm/s LV PW:         0.90 cm     LV E/e' medial:  9.6 LV IVS:        1.30 cm     LV e' lateral:   3.75 cm/s LVOT diam:     1.70 cm     LV E/e' lateral: 8.8 LV SV:         15 LV SV Index:   9 LVOT Area:     2.27 cm  LV Volumes (MOD) LV vol d, MOD A2C: 83.5 ml LV vol d, MOD A4C: 91.6 ml LV vol s, MOD A2C: 57.5 ml LV vol s, MOD A4C: 69.7 ml LV SV MOD A2C:     26.0 ml LV SV MOD A4C:     91.6 ml LV SV MOD BP:      22.8 ml RIGHT VENTRICLE RV Basal diam:  2.90 cm RV Mid diam:    2.20 cm RV S prime:     11.90 cm/s TAPSE (M-mode): 1.8 cm LEFT ATRIUM             Index        RIGHT ATRIUM           Index LA diam:        3.20 cm 1.77 cm/m   RA Area:     14.00 cm LA Vol (A2C):   51.1 ml 28.31 ml/m  RA Volume:   36.00 ml  19.94 ml/m LA Vol (A4C):   35.8 ml 19.83 ml/m LA Biplane Vol: 43.9 ml 24.32 ml/m  AORTIC VALVE                    PULMONIC VALVE AV Area (Vmax):    0.93 cm     PV Vmax:          0.75 m/s AV Area (Vmean):   0.88 cm     PV Peak grad:     2.3 mmHg AV Area (VTI):     0.89 cm     PR End Diast Vel: 2.57 msec AV Vmax:           113.00 cm/s  RVOT Peak grad:   1 mmHg AV Vmean:          82.900 cm/s AV VTI:            0.172 m AV Peak Grad:      5.1 mmHg AV Mean Grad:      3.0 mmHg  LVOT Vmax:         46.50 cm/s LVOT Vmean:  32.100 cm/s LVOT VTI:          0.068 m LVOT/AV VTI ratio: 0.39 AI PHT:            331 msec  AORTA Ao Root diam: 2.40 cm Ao Asc diam:  3.40 cm MITRAL VALVE               TRICUSPID VALVE MV Area (PHT): 7.44 cm    TR Peak grad:   36.0 mmHg MV Area VTI:   1.23 cm    TR Vmax:        300.00 cm/s MV Peak grad:  2.7 mmHg MV Mean grad:  1.0 mmHg    SHUNTS MV Vmax:       0.82 m/s    Systemic VTI:  0.07 m MV Vmean:      50.0 cm/s   Systemic Diam: 1.70 cm MV Decel Time: 102 msec MV E velocity: 33.00 cm/s MV A velocity: 82.80 cm/s MV E/A ratio:  0.40 MV A Prime:    9.5 cm/s Designer, multimedia signed by Isabell Manzanilla Signature Date/Time: 06/04/2023/11:16:48 AM    Final    DG Lumbar Spine 2-3 Views Result Date: 06/04/2023 CLINICAL DATA:  Fall.  Pain. EXAM: LUMBAR SPINE - 2-3 VIEW COMPARISON:  Lumbar spine radiographs 06/14/2010 FINDINGS: There is extensive transpedicular rod and screw fusion hardware from the lower thoracic spine through the S1 level. Associated L3-4 and L5-S1 intervertebral disc spacers. Grade 1 anterolisthesis of L3 on L4 is better seen on prior CT. Vertebral body heights appear maintained. Moderate multilevel native disc space narrowing. Within the limitations of low bone mineralization, no definite acute fracture is seen. Surgical clips overlie the pelvis. IMPRESSION: 1. Extensive transpedicular rod and screw fusion hardware from the lower thoracic spine through the S1 level. 2. No definite acute fracture is seen. Electronically Signed   By: Bertina Broccoli M.D.   On: 06/04/2023 09:55   DG Knee 1-2 Views Right Result Date: 06/04/2023 CLINICAL DATA:  Fall. EXAM: RIGHT KNEE - 1-2 VIEW COMPARISON:  Right knee radiographs 03/31/2011 FINDINGS: Interval total right knee arthroplasty. No perihardware lucency is seen to indicate hardware failure or loosening. There is diffuse decreased bone mineralization. Within this limitation, no acute fracture is  seen. Possible tiny joint effusion. Mild to moderate atherosclerotic calcifications. IMPRESSION: 1. Interval total right knee arthroplasty without evidence of hardware failure. 2. No acute fracture. Electronically Signed   By: Bertina Broccoli M.D.   On: 06/04/2023 09:53   DG Ankle 2 Views Right Result Date: 06/04/2023 CLINICAL DATA:  Fall.  Pain. EXAM: RIGHT ANKLE - 2 VIEW COMPARISON:  None Available. FINDINGS: There is diffuse decreased bone mineralization. There is a transverse fracture of the superior aspect of the medial malleolus with up to 3 mm lateral displacement of the distal fracture component with respect to the proximal fracture component. Likely old healed fracture of the distal fibular diaphysis with minimal cortical step-off on lateral view. The ankle mortise is symmetric and intact. IMPRESSION: 1. Acute mildly displaced fracture of the medial malleolus. 2. Likely old healed fracture of the distal fibular diaphysis. Electronically Signed   By: Bertina Broccoli M.D.   On: 06/04/2023 09:52   DG Ankle 2 Views Left Result Date: 06/04/2023 CLINICAL DATA:  Fall.  Pain. EXAM: LEFT ANKLE - 2 VIEW COMPARISON:  None Available. FINDINGS: There is diffuse decreased bone mineralization. The ankle mortise is symmetric and intact. Mild distal medial and lateral malleolar degenerative spurs. No acute fracture is  seen. No dislocation. IMPRESSION: 1. No acute fracture. 2. Mild distal medial and lateral malleolar degenerative spurs. Electronically Signed   By: Bertina Broccoli M.D.   On: 06/04/2023 09:19   DG Knee 1-2 Views Left Result Date: 06/04/2023 CLINICAL DATA:  Fall.  Pain. EXAM: LEFT KNEE - 1-2 VIEW COMPARISON:  None Available. FINDINGS: Status post total left knee arthroplasty. No perihardware lucency is seen to indicate hardware failure or loosening. No joint effusion. No acute fracture is seen. No dislocation. IMPRESSION: Status post total left knee arthroplasty without evidence of hardware failure.  Electronically Signed   By: Bertina Broccoli M.D.   On: 06/04/2023 09:04   CT Cervical Spine Wo Contrast Result Date: 06/03/2023 CLINICAL DATA:  Fall with trauma to the head and neck. EXAM: CT CERVICAL SPINE WITHOUT CONTRAST TECHNIQUE: Multidetector CT imaging of the cervical spine was performed without intravenous contrast. Multiplanar CT image reconstructions were also generated. RADIATION DOSE REDUCTION: This exam was performed according to the departmental dose-optimization program which includes automated exposure control, adjustment of the mA and/or kV according to patient size and/or use of iterative reconstruction technique. COMPARISON:  02/25/2022 FINDINGS: Alignment: Chronic degenerative anterolisthesis at C3-4 of 4 mm. Chronic degenerative anterolisthesis at C7-T1 of 4 mm. Skull base and vertebrae: No regional fracture or focal bone lesion. Chronic auto fusion at the C2-3 level. Chronic surgical fusion from C5 through C7. Fusion also at the C 4 5 level could be auto fusion or surgical fusion but without hardware. Soft tissues and spinal canal: No traumatic soft tissue finding. Disc levels:  No stenosis at C2-3 or above. C3-4: Advanced facet arthropathy with 4 mm of degenerative anterolisthesis. Right foraminal stenosis could affect the right C4 nerve. No compressive stenosis in the region from C4-C7. Advanced facet arthropathy at C7-T1 with 4 mm of degenerative anterolisthesis. Potential for compression or irritation of either C8 nerve. Upper chest: Negative Other: None IMPRESSION: 1. No acute or traumatic finding. Chronic auto fusion at C2-3. Chronic surgical fusion from C5 through C7. Fusion also at the C4-5 level could be auto fusion or surgical fusion but without hardware. 2. Advanced facet arthropathy at C3-4 with 4 mm of degenerative anterolisthesis. Right foraminal stenosis could affect the right C4 nerve. 3. Advanced facet arthropathy at C7-T1 with 4 mm of degenerative anterolisthesis. Potential  for compression or irritation of either C8 nerve. Electronically Signed   By: Bettylou Brunner M.D.   On: 06/03/2023 11:38   CT HEAD WO CONTRAST ( ) Result Date: 06/03/2023 CLINICAL DATA:  Fall with trauma to the head EXAM: CT HEAD WITHOUT CONTRAST TECHNIQUE: Contiguous axial images were obtained from the base of the skull through the vertex without intravenous contrast. RADIATION DOSE REDUCTION: This exam was performed according to the departmental dose-optimization program which includes automated exposure control, adjustment of the mA and/or kV according to patient size and/or use of iterative reconstruction technique. COMPARISON:  02/25/2022 FINDINGS: Brain: Age related volume loss. Chronic small-vessel ischemic changes of the cerebral hemispheric white matter. 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 No sign of acute infarction, mass lesion, hemorrhage, hydrocephalus or extra-axial collection. Vascular: There is atherosclerotic calcification of the major vessels at the base of the brain. Skull: Negative Sinuses/Orbits: Clear/normal Other: None IMPRESSION: No acute or traumatic finding. Age related volume loss. Chronic small-vessel ischemic changes of the cerebral hemispheric white matter. 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 Electronically Signed   By: Bettylou Brunner M.D.   On: 06/03/2023 11:35   DG Pelvis  1-2 Views Result Date: 06/03/2023 CLINICAL DATA:  Recent fall EXAM: PELVIS - 1-2 VIEW COMPARISON:  CT 04/06/2022 FINDINGS: Distant lumbosacral fusion procedure. Surgical clips overlie the central pelvis. No evidence of acute regional fracture. Chronic sacroiliac degenerative changes are noted. IMPRESSION: No acute or traumatic finding. Distant lumbosacral fusion procedure. Chronic sacroiliac degenerative changes. Electronically Signed   By: Bettylou Brunner M.D.   On: 06/03/2023 11:32   DG Chest Portable 1 View Result Date: 06/03/2023 CLINICAL DATA:  Shortness of breath. EXAM: PORTABLE CHEST 1  VIEW COMPARISON:  01/02/2012 FINDINGS: The heart size and mediastinal contours are unremarkable. Low lung volumes with asymmetric elevation of right hemidiaphragm. No significant pleural effusion. Atelectasis noted within the left base. Postoperative changes noted within the lumbar spine and lower cervical vertebra. IMPRESSION: Low lung volumes with left base atelectasis. Electronically Signed   By: Kimberley Penman M.D.   On: 06/03/2023 11:02    ECHO LVEF <20%  TELEMETRY reviewed by me  06/06/2023 : Sinus rhythm rate 90s  EKG reviewed by me: Sinus tachycardia with rate of 102 bpm, prolonged QTc at 516   Data reviewed by me  06/06/2023: last 24h vitals tele labs imaging I/O provider notes  Principal Problem:   NSTEMI (non-ST elevated myocardial infarction) [facility]) Active Problems:   Essential hypertension, benign   Hypercholesterolemia   Hypothyroidism   GERD (gastroesophageal reflux disease)   S/P total knee replacement   Depression, recurrent (HCC)   AKI (acute kidney injury) (HCC)   Obesity (BMI 30-39.9)   Iron  deficiency anemia   Rhabdomyolysis   Bilateral leg pain   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 is an 83 year old female with history of coronary artery disease s/p stent placement, hypertension, hyperlipidemia, chronic back pain, hypothyroid, who presents emergency department for chief concerns of a fall at home. Patient had been laying on the floor at home for an unknown amount of time coming in with significant rhabdo and troponin elevation peaking at 10,000, concerning for NSTEMI type 1 vs. Type 2. CK peaked at 36,000 and CKMB at 240.   # CVA, left occipital region # Rhabdomyolysis # NSTEMI Type I vs Type II # Hypertension # Hyperlipidemia #  AKI Patient presented to the ED after a fall for an unknown amount of time with rhabdo and elevated troponins. Trops elevated and trended 3,186 > 10,962 > 10,573 > 8,592. CK total 6,330 > 36,602 > 12,945. CKMB 240. BNP 1,009. Echo this admission with newly reduced EF at < 20%, LV global hypokinesis, moderate TR, mild MR. EKG in ED with Sinus tachycardia with rate of 102 bpm, prolonged QTc at 516 with no significant ST changes. At 4 AM code stroke was called after signs of stroke-like symptoms and MRI brain revealed ischemia of left occipital lobe. Heparin  was paused at this time per neurology. This AM patient is sleeping and obtunded. BP are borderline and HR stable.  -Continue with IVF hydration. Patient is not in CHF and can be hydrated liberally despite reduced LVEF. -Trops are flat at this point. 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 -Heparin  gtt held per neurology.  -Can start metoprolol succinate if BP/HR allow. -Hold ACE-I/ARB and statin until kidneys/rhabdo improve. Avoid nephrotoxic meds at this time. -Maintain on telemetry. -Optimize lytes, K>4  and Mag>2. -Would need to be d/c with Life Vest due to newly reduced EF of < 20%.  -Palliative care consulted.  -Given stroke, rhabdo would recommend conservative management at this time. -Patient remains minimally interactive, overall prognosis is guarded.  This patient's plan of care was discussed and created with Dr. Bob Burn and he is in agreement.    Signed: Hamp Levine, PA-C 06/06/2023, 9:07 AM [facility]

## 2023-06-06 NOTE — Plan of Care (Signed)
  Problem: Education: Goal: Knowledge of General Education information will improve Description: Including pain rating scale, medication(s)/side effects and non-pharmacologic comfort measures Outcome: Progressing   Problem: Health Behavior/Discharge Planning: Goal: Ability to manage health-related needs will improve Outcome: Progressing   Problem: Clinical Measurements: Goal: Ability to maintain clinical measurements within normal limits will improve Outcome: Progressing Goal: Will remain free from infection Outcome: Progressing Goal: Diagnostic test results will improve Outcome: Progressing Goal: Respiratory complications will improve Outcome: Progressing Goal: Cardiovascular complication will be avoided Outcome: Progressing   Problem: Activity: Goal: Risk for activity intolerance will decrease Outcome: Progressing   Problem: Nutrition: Goal: Adequate nutrition will be maintained Outcome: Progressing   Problem: Coping: Goal: Level of anxiety will decrease Outcome: Progressing   Problem: Elimination: Goal: Will not experience complications related to bowel motility Outcome: Progressing Goal: Will not experience complications related to urinary retention Outcome: Progressing   Problem: Pain Managment: Goal: General experience of comfort will improve and/or be controlled Outcome: Progressing   Problem: Safety: Goal: Ability to remain free from injury will improve Outcome: Progressing   Problem: Skin Integrity: Goal: Risk for impaired skin integrity will decrease Outcome: Progressing   Problem: Education: Goal: Knowledge of disease or condition will improve Outcome: Progressing Goal: Knowledge of secondary prevention will improve (MUST DOCUMENT ALL) Outcome: Progressing Goal: Knowledge of patient specific risk factors will improve (DELETE if not current risk factor) Outcome: Progressing   Problem: Ischemic Stroke/TIA Tissue Perfusion: Goal: Complications of  ischemic stroke/TIA will be minimized Outcome: Progressing   Problem: Coping: Goal: Will verbalize positive feelings about self Outcome: Progressing Goal: Will identify appropriate support needs Outcome: Progressing   Problem: Health Behavior/Discharge Planning: Goal: Ability to manage health-related needs will improve Outcome: Progressing Goal: Goals will be collaboratively established with patient/family Outcome: Progressing   Problem: Self-Care: Goal: Ability to participate in self-care as condition permits will improve Outcome: Progressing Goal: Verbalization of feelings and concerns over difficulty with self-care will improve Outcome: Progressing Goal: Ability to communicate needs accurately will improve Outcome: Progressing   Problem: Nutrition: Goal: Risk of aspiration will decrease Outcome: Progressing Goal: Dietary intake will improve Outcome: Progressing   Problem: Education: Goal: Knowledge of the prescribed therapeutic regimen will improve Outcome: Progressing   Problem: Coping: Goal: Ability to identify and develop effective coping behavior will improve Outcome: Progressing   Problem: Clinical Measurements: Goal: Quality of life will improve Outcome: Progressing   Problem: Respiratory: Goal: Verbalizations of increased ease of respirations will increase Outcome: Progressing   Problem: Role Relationship: Goal: Family's ability to cope with current situation will improve Outcome: Progressing Goal: Ability to verbalize concerns, feelings, and thoughts to partner or family member will improve Outcome: Progressing   Problem: Pain Management: Goal: Satisfaction with pain management regimen will improve Outcome: Progressing

## 2023-06-06 NOTE — TOC Progression Note (Signed)
 Transition of Care [facility]) - Progression Note    Patient Details  Name: Laurie Hale MRN: [id number] Date of Birth: Jun 24, 1940  [facility] CM/SW Contact  Adelfa Lozito A Winola Drum, RN Phone Number: 06/06/2023, 3:22 PM  Clinical Narrative:    Chart reviewed.  Noted that patient was admitted with NSTEMI.  Noted that patient was a code stroke on 06-05-2023.  Family has decided to make patient full comfort care.  TOC will follow and provide support to family.          Expected Discharge Plan and Services                                               Social Determinants of Health (SDOH) Interventions SDOH Screenings   Food Insecurity: No Food Insecurity (06/03/2023)  Housing: Low Risk  (06/03/2023)  Transportation Needs: No Transportation Needs (06/03/2023)  Utilities: Not At Risk (06/03/2023)  Financial Resource Strain: Patient Declined (10/21/2022)   Received from [facility]  Social Connections: Unknown (06/03/2023)  Tobacco Use: Low Risk  (06/03/2023)    Readmission Risk Interventions     No data to display

## 2023-06-06 NOTE — Progress Notes (Addendum)
 Daily Progress Note   Patient Name: Laurie Hale       Date: 06/06/2023 DOB: October 30, 1940  Age: 83 y.o. MRN#: [id number] Attending Physician: Laurie Salinas, MD Primary Care Physician: Laurie Collard, MD Admit Date: 06/03/2023  Reason for Consultation/Follow-up: Establishing goals of care  Subjective: Notes and labs reviewed.  In to see patient.  She is currently resting in bed with mittens in place.  Patient's 3 children (2 daughters and 1 son ) and one of her grandchildren are at bedside.  The children have brought a healthcare power of attorney and living will form.  The healthcare power of attorney documents states that Laurie Hale otherwise known as Laurie Hale is primary healthcare power of attorney.  It lists the patient's 2 sons as backup HPOA.  The form discusses a desire for natural death.  They discuss that patient has been widowed for around 3 years, after 63 years of marriage.  They state she retired from [employer].  The children advised that they have a brother who lives with the patient-he is not present at the meeting.  They explained that the they just occupy the same home, but functionally, neither is there to assist the other.    They discuss that she has back and shoulder pain for which she used to use morphine  for.  They advised that she no longer uses morphine , but uses Aleve.  The children states that she has not had morphine  in over 3 years.  They discuss that her quality of life is poor due to difficulty with mobility.  They state she misses her husband.  We discussed her diagnoses, prognosis, GOC, EOL wishes disposition and options.  Created space and opportunity for patient  to explore thoughts and feelings regarding current medical information.   A detailed discussion was had today  regarding advanced directives.  Concepts specific to code status, artifical feeding and hydration, IV antibiotics and rehospitalization were discussed.  The difference between an aggressive medical intervention path and a comfort care path was discussed.  Values and goals of care important to patient and family were attempted to be elicited.  Discussed limitations of medical interventions to prolong quality of life in some situations and discussed the concept of human mortality.  They state she is a person of strong faith and will leave this body to  go home and be with the Lord.  They state she has been ready to go.  They again discuss that she has not had a good quality of life, and share that she would never want to live in a nursing facility, though patient's son who lives with her would not be able to care for her. The family discusses that they are of faith, and would rather her go home to be with the Lord, than to suffer.    With conversation the family agrees that they would like to shift to comfort care.   She would not want CPR, ventilator support, a feeding tube, dialysis, or other life-prolonging care. We discussed potential hospital death.  Discussed that if her status improves, she may be a candidate to go to the hospice facility.  I completed a MOST form today with H POA daughter Laurie Hale, with other and the signed original was placed in the chart. Each section of options on the form were reviewed in full detail and any questions were answered as needed. The form was scanned and sent to medical records for it to be uploaded under ACP tab in Epic. A photocopy was also placed in the chart to be scanned into EMR. The patient outlined their wishes for the following treatment decisions:  Cardiopulmonary Resuscitation: Do Not Attempt Resuscitation (DNR/No CPR)  Medical Interventions: Comfort Measures: Keep clean, warm, and dry. Use medication by any route, positioning, wound care, and other measures to  relieve pain and suffering. Use oxygen, suction and manual treatment of airway obstruction as needed for comfort. Do not transfer to the hospital unless comfort needs cannot be met in current location.  Antibiotics: No antibiotics (use other measures to relieve symptoms)  IV Fluids: No IV fluids (provide other measures to ensure comfort)  Feeding Tube: No feeding tube        Length of Stay: 3  Current Medications: Scheduled Meds:    stroke: early stages of recovery book   Does not apply Once   Chlorhexidine  Gluconate Cloth  6 each Topical Daily   lidocaine   2 patch Transdermal Q24H   mouth rinse  15 mL Mouth Rinse 4 times per day   polyethylene glycol  17 g Oral BID    Continuous Infusions:   PRN Meds: acetaminophen  **OR** acetaminophen , antiseptic oral rinse, fentaNYL  (SUBLIMAZE ) injection, glycopyrrolate  **OR** glycopyrrolate  **OR** glycopyrrolate , haloperidol  **OR** haloperidol  **OR** haloperidol  lactate, HYDROmorphone  (DILAUDID ) injection, LORazepam  **OR** LORazepam  **OR** LORazepam , ondansetron  **OR** ondansetron  (ZOFRAN ) IV, mouth rinse, mouth rinse, polyvinyl alcohol   Physical Exam Constitutional:      Comments: Eyes closed  Pulmonary:     Effort: Pulmonary effort is normal.  Skin:    General: Skin is warm and dry.     Comments: Mittens in place             Vital Signs: BP (!) 163/92   Pulse (!) 105   Temp 97.7 F (36.5 C) (Axillary)   Resp 13   Ht 5\' 1"  (1.549 m)   Wt 82.2 kg   SpO2 99%   BMI 34.24 kg/m  SpO2: SpO2: 99 % O2 Device: O2 Device: Nasal Cannula O2 Flow Rate: O2 Flow Rate (L/min): 3 L/min  Intake/output summary:  Intake/Output Summary (Last 24 hours) at 06/06/2023 1509 Last data filed at 06/06/2023 1200 Gross per 24 hour  Intake 778.76 ml  Output 850 ml  Net -71.24 ml   LBM: Last BM Date : 06/03/23 Baseline Weight: Weight: 81.6 kg Most recent weight: Weight:  82.2 kg        Patient Active Problem List   Diagnosis Date Noted    Somnolence 06/05/2023   Acute CVA (cerebrovascular accident) (HCC) 06/05/2023   Acute heart failure with reduced ejection fraction (HFrEF, <= 40%) (HCC) 06/04/2023   Displaced fracture of medial malleolus of right tibia, initial encounter for closed fracture 06/04/2023   NSTEMI (non-ST elevated myocardial infarction) (HCC) 06/03/2023   Rhabdomyolysis 06/03/2023   Bilateral leg pain 06/03/2023   Iron  deficiency anemia 03/01/2022   Left leg pain 02/28/2022   Acute cystitis with hematuria 02/28/2022   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, stage 3 (HCC) 02/27/2022   Ruptured, bladder, spontaneous 02/26/2022   Hematoma of bladder wall 02/26/2022   Acute blood loss anemia 02/26/2022   Metabolic acidosis 02/26/2022   UTI (urinary tract infection) 02/25/2022   Hematuria 02/25/2022   Depression 02/25/2022   AKI (acute kidney injury) (HCC) 02/25/2022   Abnormal LFTs 02/25/2022   BV (bacterial vaginosis) 02/25/2022   Obesity (BMI 30-39.9) 02/25/2022   Elevated lipase 02/25/2022   Closed right ankle fracture 02/25/2022   Fall at home, initial encounter 02/25/2022   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 prolapse 10/24/2017   Depression, recurrent (HCC) 10/24/2017   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   Dysuria 10/21/2013   Nasal lesion 07/14/2013   Fatigue 04/02/2013   Breast cancer (HCC) 12/02/2012   CAD (coronary artery disease) 09/15/2012   Essential hypertension, benign 09/15/2012   Hypercholesterolemia 09/15/2012   Hypothyroidism 09/15/2012   GERD (gastroesophageal reflux disease) 09/15/2012   Diverticulosis 09/15/2012   Chronic back pain 09/15/2012   Hypokalemia 09/15/2012    Palliative Care Assessment & Plan    Recommendations/Plan: Shifted to comfort care.  Code Status:    Code Status Orders  (From admission, onward)           Start     Ordered   06/06/23 1425  Do not attempt resuscitation (DNR) - Comfort care  Continuous       Question Answer Comment  If patient has no pulse and  is not breathing Do Not Attempt Resuscitation   In Pre-Arrest Conditions (Patient Is Breathing and Has a Pulse) Provide comfort measures. Relieve any mechanical airway obstruction. Avoid transfer unless required for comfort.   Consent: Discussion documented in EHR or advanced directives reviewed      06/06/23 1426           Code Status History     Date Active Date Inactive Code Status Order ID Comments User Context   06/03/2023 1351 06/06/2023 1426 Full Code [id number]  Cedric Cohn, DO ED   02/25/2022 1133 03/03/2022 1647 DNR [id number]  Fidencio Hue, MD ED   02/25/2022 1044 02/25/2022 1133 Full Code [id number]  Fidencio Hue, MD ED   10/09/2017 1500 10/10/2017 1705 Full Code [id number]  Janeal Mealing, PA Inpatient       Prognosis:  Hours - Days    Care plan was discussed with attending and nurse via epic chat  Thank you for allowing the Palliative Medicine Team to assist in the care of this patient.     Meribeth Standard, NP  Please contact Palliative Medicine Team phone at [phone] for questions and concerns.

## 2023-06-06 NOTE — Progress Notes (Signed)
 Noted changed to comfort care, neurology will continue to be available as needed, please call if further questions or concerns.  Ann Keto, MD [facility]   If 7pm- 7am, please page neurology on call as listed in [facility].

## 2023-06-06 NOTE — Progress Notes (Signed)
 Pt has order to transfer to 1C-112. Pt transition to comfort care. Report called to [facility]. All belongings sent with pt

## 2023-06-06 NOTE — Assessment & Plan Note (Signed)
 Small improvement in creatinine today.  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  Renal ultrasound without any significant abnormality.  Likely multifactorial with ATN due to hypoxia and NSTEMI plus rhabdomyolysis -Continue with IV fluid -Monitor renal function -Avoid nephrotoxins

## 2023-06-06 NOTE — Assessment & Plan Note (Signed)
 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  Prior echo was done in October 2020 and it was normal.  BNP elevated at 1009. Clinically appears euvolemic - Management per cardiology - Monitor volume status closely as giving some IV fluid due to rhabdomyolysis.  She will remain high risk for a flash pulmonary edema,

## 2023-06-06 NOTE — Assessment & Plan Note (Signed)
 Code stroke was activated overnight when patient was found to be aphasic and right-sided hemiparesis.  Initial CT head was negative but MRI did show moderate left MCA territory infarct predominantly affecting parietal lobe. Neurology was consulted. - Holding heparin  infusion as she is high risk for bleed -EEG was negative for any epileptiform changes but did show encephalopathy which is focally worsened in stroke area -Did receive initial dose of Keppra  which was later discontinued after positive MRI -Patient remained very restless and encephalopathic.  Unable to obtain swallow evaluation, seems like having dense right upper extremity hemiparesis, some spontaneous movement in right lower extremity. - Family now decided to transition her to full comfort care

## 2023-06-06 NOTE — Assessment & Plan Note (Signed)
 Patient denies any chest pain and she was more concentrated on leg pain.  Appears very restless. Echocardiogram with new diagnosis of HFrEF with EF less than 20% and global hypokinesis.  Troponin peaked at 10,962 CK-MB also elevated Cardiology is on board- -holding heparin  infusion due to recent stroke -Appreciate cardiology recommendations - Patient is now being transitioned to full comfort care

## 2023-06-06 NOTE — Progress Notes (Signed)
 Progress Note   Patient: Laurie Hale JYN:[id number] DOB: 1941/02/13 DOA: 06/03/2023     3 DOS: the patient was seen and examined on 06/06/2023   Brief hospital course: [name] is an 83 year old female with history of hypertension, hyperlipidemia, chronic back pain, hypothyroid, who presents emergency department for chief concerns of a fall at home.  Patient had been laying on the floor at home for an unknown amount of time. Patient could not answer how she fell, last known well was a night prior. She was complaining of progressive generalized weakness for about a week  Per ED nursing documentation, EMS gave patient aspirin  325 mg p.o. one-time dose, sodium chloride  500 mL liter bolus.  Vitals in the ED showed T of 97.6, rr 22, hr 100, blood pressure 113/74, SpO2 95% on 2 L nasal cannula.  Serum sodium is 138, potassium 4.0, chloride 104, bicarb 21, BUN of 33, serum creatinine 1.47, EGFR 35, nonfasting blood glucose 123, WBC 17.8, hemoglobin 13.6, platelets of 291.  CK was elevated at 6330. High sensitive troponin was 3186.  COVID/influenza A/influenza B/RSV PCR were negative.  ED treatment: Fentanyl  50 mcg IV, 2 doses were given, heparin  bolus and gtt. were initiated, sodium chloride  1 L bolus.  4/20: Vital stable, overnight significant bilateral leg pain and an ankle abrasion noted, imaging of lumbar spine and bilateral lower extremities Shows a mildly displaced fracture of medial malleolus. UA with likely myoglobin, and rare bacteria.  Improving leukocytosis now at 11, bicarb of 20, BUN 35 with slight worsening of creatinine to 1.52-baseline seems to be normal.  Worsening CK and troponin.  Patient seems very restless although complaining more about leg pain. 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  CK-MB was elevated at 239.8  4/21: Code stroke was activated overnight when patient was found aphasic, right-sided weakness,  right facial droop and gaze preference.  CT head was without any acute abnormality.  CTA head and neck was negative for large vessel occlusion but did show high-grade stenosis/near occlusion of the left PCA P2 segment and also shows an abnormal oligemia and possible small infarct.  Neurology evaluated her and her symptoms did not match to those findings.  Unwitnessed seizure was another consideration so she was loaded with Keppra , heparin  was held and patient was transferred to stepdown for close monitoring. MRI brain was positive for wedge-shaped posterior left MCA territory infarct, predominantly affecting parietal lobe. Labs with resolved leukocytosis, creatinine continue to get worse now at 1.67, improving CK now at 12,935, troponin started trending down at 8592. ABI was normal bilaterally Neurology stopped Keppra , holding heparin  infusion.  Patient with multiorgan dysfunction and critically ill-palliative care was also consulted.  She is high risk for mortality.  4/22: Blood pressure started trending up, saturating well on 5 L of oxygen.  EEG was negative for epileptiform discharges, did show a focal area of cerebral dysfunction in the left posterior quadrant in addition to the more generalized nonspecific cerebral dysfunction, correlates with recent stroke.  Labs with improving troponin, currently at 3,223, [place] [address], bicarb 21, creatinine started improving, at 1.23, hemoglobin decreased to 10.9 but all cell line decreased.  Patient remained quite encephalopathic, some movement in right lower extremity, no movement of right upper extremity.  Palliative care meeting with family and they decided to transition her to full comfort care.  Comfort care measures were initiated.  Patient has quite guarded prognosis.  Assessment and Plan: *  NSTEMI (non-ST elevated myocardial infarction) [facility]) Patient denies any chest pain and she was more concentrated on leg pain.  Appears very restless. Echocardiogram  with new diagnosis of HFrEF with EF less than 20% and global hypokinesis.  Troponin peaked at 10,962 CK-MB also elevated Cardiology is on board- -holding heparin  infusion due to recent stroke -Appreciate cardiology recommendations - Patient is now being transitioned to full comfort care  Acute CVA (cerebrovascular accident) [facility]) Code stroke was activated overnight when patient was found to be aphasic and right-sided hemiparesis.  Initial CT head was negative but MRI did show moderate left MCA territory infarct predominantly affecting parietal lobe. Neurology was consulted. - Holding heparin  infusion as she is high risk for bleed -EEG was negative for any epileptiform changes but did show encephalopathy which is focally worsened in stroke area -Did receive initial dose of Keppra  which was later discontinued after positive MRI -Patient remained very restless and encephalopathic.  Unable to obtain swallow evaluation, seems like having dense right upper extremity hemiparesis, some spontaneous movement in right lower extremity. 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  Prior echo was done in October 2020 and it was normal.  BNP elevated at 1009. Clinically appears euvolemic - Management per cardiology - Monitor volume status closely as giving some IV fluid due to rhabdomyolysis.  She will remain high risk for a flash pulmonary edema,   Rhabdomyolysis CK started improving, currently at [phone] Patient was found on ground-unknown time.  And she could not recall any thing related to fall and how long she was there. - Continue with IV fluid -Monitor CK levels -Holding statin -Patient has been transitioned to full comfort care  Bilateral leg pain Especially with walking ABI normal - Ordered Dilaudid  as  morphine  was not helping and patient was very restless due to pain  Displaced fracture of medial malleolus of right tibia, initial encounter for closed fracture Likely secondary to fall.  Patient had history of multiple fractures in the past-imaging was done due to her complaint of bilateral pain but right more than left.  Shows mildly displaced fracture of right medial malleolus. 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 -Supportive care with pain control  AKI (acute kidney injury) (HCC) Small improvement in creatinine today.  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  Renal ultrasound without any significant abnormality.  Likely multifactorial with ATN due to hypoxia and NSTEMI plus rhabdomyolysis -Continue with IV fluid -Monitor renal function -Avoid nephrotoxins  Essential hypertension, benign Blood pressure borderline soft this morning - Holding home amlodipine  due to hypotension -Keep holding lisinopril  and HCTZ as patient also has AKI -Continue to monitor  Hypercholesterolemia - Holding home statin due to rhabdomyolysis  Hypothyroidism Levothyroxine  88 mcg daily resumed   Obesity (BMI 30-39.9) Estimated body mass index is 33.97 kg/m as calculated from the following:   Height as of this encounter: 5\' 1"  (1.549 m).   Weight as of this encounter: 81.6 kg.   This complicates overall care and prognosis.   Depression, recurrent (HCC) Home sertraline  150 mg daily resumed    Subjective: Patient remained quite encephalopathic and intermittent moaning, appears restless, no movements on right upper extremity, very small spontaneous movements on right lower  extremity.  Multiple family members at bedside  Physical Exam: Vitals:   06/06/23 1101 06/06/23 1200 06/06/23 1300 06/06/23 1400  BP: (!) 149/138 (!) 153/138 (!) 115/59 (!) 163/92  Pulse: (!) 102 (!) 102 85 (!) 105  Resp: 16 (!) 26  16 13   Temp:  97.7 F (36.5 C)    TempSrc:  Axillary    SpO2: 99% 98% 92% 99%  Weight:      Height:       General.  Remained encephalopathic and appears restless Pulmonary.  Lungs clear bilaterally, normal respiratory effort. CV.  Regular rate and rhythm,  Abdomen.  Soft, nontender, nondistended, BS positive. CNS.  Encephalopathic, appears restless, not following any commands, no spontaneous movement of right upper extremity, small movements of right lower extremity, moving left side. Extremities.  No edema, no cyanosis, pulses intact and symmetrical.  Data Reviewed: Prior data reviewed  Family Communication: Discussed with 2 sons, daughter and other family members at bedside  Disposition: Status is: Inpatient Remains inpatient appropriate because: Severity of illness  Planned Discharge Destination: Patient is being transitioned to comfort care  Time spent: 50 minutes  This record has been created using Conservation officer, historic buildings. Errors have been sought and corrected,but may not always be located. Such creation errors do not reflect on the standard of care.   Author: [name], MD 06/06/2023 2:33 PM  For on call review [email or web address].

## 2023-06-06 NOTE — Assessment & Plan Note (Signed)
 CK started improving, currently at [phone] Patient was found on ground-unknown time.  And she could not recall any thing related to fall and how long she was there. - Continue with IV fluid -Monitor CK levels -Holding statin -Patient has been transitioned to full comfort care

## 2023-06-06 NOTE — Plan of Care (Signed)
  Problem: Elimination: Goal: Will not experience complications related to urinary retention Outcome: Progressing   Problem: Safety: Goal: Ability to remain free from injury will improve Outcome: Progressing   Problem: Skin Integrity: Goal: Risk for impaired skin integrity will decrease Outcome: Progressing   Problem: Pain Managment: Goal: General experience of comfort will improve and/or be controlled Outcome: Not Progressing

## 2023-06-07 DIAGNOSIS — Z515 Encounter for palliative care: Secondary | ICD-10-CM | POA: Diagnosis not present

## 2023-06-07 DIAGNOSIS — I214 Non-ST elevation (NSTEMI) myocardial infarction: Secondary | ICD-10-CM | POA: Diagnosis not present

## 2023-06-07 LAB — BASIC METABOLIC PANEL WITH GFR
Anion gap: 6 (ref 5–15)
BUN: 26 mg/dL — ABNORMAL HIGH (ref 8–23)
CO2: 23 mmol/L (ref 22–32)
Calcium: 8.7 mg/dL — ABNORMAL LOW (ref 8.9–10.3)
Chloride: 112 mmol/L — ABNORMAL HIGH (ref 98–111)
Creatinine, Ser: 1.13 mg/dL — ABNORMAL HIGH (ref 0.44–1.00)
GFR, Estimated: 48 mL/min — ABNORMAL LOW (ref >60)
Glucose, Bld: 96 mg/dL (ref 70–99)
Potassium: 4.6 mmol/L (ref 3.5–5.1)
Sodium: 141 mmol/L (ref 135–145)

## 2023-06-07 MED ORDER — HYDROMORPHONE BOLUS VIA INFUSION
0.5000 mg | INTRAVENOUS | Status: DC | PRN
  Administered 2023-06-07: 0.5 mg via INTRAVENOUS

## 2023-06-07 MED ORDER — HYDROMORPHONE BOLUS VIA INFUSION: 1.0000 mg | INTRAVENOUS | Status: DC | PRN

## 2023-06-07 MED ORDER — HYDROMORPHONE HCL-NACL 50-0.9 MG/50ML-% IV SOLN
0.5000 mg/h | INTRAVENOUS | Status: DC
  Filled 2023-06-07: qty 50

## 2023-06-07 MED ORDER — HYDROMORPHONE HCL 1 MG/ML IJ SOLN: 1.0000 mg | INTRAMUSCULAR | Status: DC

## 2023-06-07 MED ORDER — HYDROMORPHONE HCL 1 MG/ML IJ SOLN: 1.0000 mg | Freq: Once | INTRAMUSCULAR | Status: DC

## 2023-06-07 MED ORDER — HYDROMORPHONE HCL-NACL 50-0.9 MG/50ML-% IV SOLN
0.5000 mg/h | INTRAVENOUS | Status: DC
  Administered 2023-06-07: 0.5 mg/h via INTRAVENOUS
  Administered 2023-06-08: 1.5 mg/h via INTRAVENOUS
  Filled 2023-06-07 (×2): qty 50

## 2023-06-07 NOTE — Progress Notes (Addendum)
 Progress Note    Laurie Hale  ZOX:[id number] DOB: Aug 29, 1940  DOA: 06/03/2023 PCP: Rory Collard, MD      Brief Narrative:    Medical records reviewed and are as summarized below:   [name] is an 83 year old female with history of hypertension, hyperlipidemia, chronic back pain, hypothyroidism, who was brought to the emergency department after a fall at home.  She was found on the floor at home and it was not clear how long she had been laying on the floor.  Circumstances surrounding the fall was unclear. Last known normal was the night prior to being found at home.  She had almost completed antibiotics for a UTI before coming to the hospital.   Per ED nursing documentation, EMS gave patient aspirin  325 mg p.o. one-time dose, sodium chloride  500 mL liter bolus.  Vitals in the ED showed T of 97.6, rr 22, hr 100, blood pressure 113/74, SpO2 95% on 2 L nasal cannula.  Serum sodium is 138, potassium 4.0, chloride 104, bicarb 21, BUN of 33, serum creatinine 1.47, EGFR 35, nonfasting blood glucose 123, WBC 17.8, hemoglobin 13.6, platelets of 291.  CK was elevated at 6330. High sensitive troponin was 3186.  COVID/influenza A/influenza B/RSV PCR were negative.  ED treatment: Fentanyl  50 mcg IV, 2 doses were given, heparin  bolus and gtt. were initiated, sodium chloride  1 L bolus.  4/20: Vital stable, overnight significant bilateral leg pain and an ankle abrasion noted, imaging of lumbar spine and bilateral lower extremities Shows a mildly displaced fracture of medial malleolus. UA with likely myoglobin, and rare bacteria.  Improving leukocytosis now at 11, bicarb of 20, BUN 35 with slight worsening of creatinine to 1.52-baseline seems to be normal.  Worsening CK and troponin.  Patient seems very restless although complaining more about leg pain. 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  CK-MB was elevated at 239.8  4/21:  Code stroke was activated overnight when patient was found aphasic, right-sided weakness, right facial droop and gaze preference.  CT head was without any acute abnormality.  CTA head and neck was negative for large vessel occlusion but did show high-grade stenosis/near occlusion of the left PCA P2 segment and also shows an abnormal oligemia and possible small infarct.  Neurology evaluated her and her symptoms did not match to those findings.  Unwitnessed seizure was another consideration so she was loaded with Keppra , heparin  was held and patient was transferred to stepdown for close monitoring. MRI brain was positive for wedge-shaped posterior left MCA territory infarct, predominantly affecting parietal lobe. Labs with resolved leukocytosis, creatinine continue to get worse now at 1.67, improving CK now at 12,935, troponin started trending down at 8592. ABI was normal bilaterally Neurology stopped Keppra , holding heparin  infusion.  Patient with multiorgan dysfunction and critically ill-palliative care was also consulted.  She is high risk for mortality.  4/22: Blood pressure started trending up, saturating well on 5 L of oxygen.  EEG was negative for epileptiform discharges, did show a focal area of cerebral dysfunction in the left posterior quadrant in addition to the more generalized nonspecific cerebral dysfunction, correlates with recent stroke.  Labs with improving troponin, currently at 3,223, [place] [address], bicarb 21, creatinine started improving, at 1.23, hemoglobin decreased to 10.9 but all cell line decreased.  Patient remained quite encephalopathic, some movement in right lower extremity, no movement of right upper extremity.  Palliative care meeting with family and  they decided to transition her to full comfort care.  Comfort care measures were initiated.  Patient has quite guarded prognosis.      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  (Obesity)   NSTEMI (non-ST elevated myocardial infarction) (HCC) No report of chest pain. Echocardiogram with new diagnosis of HFrEF with EF less than 20% and global hypokinesis.  Troponin peaked at 10,962 CK-MB also elevated Cardiology signed off S/p IV heparin  drip.    Acute CVA (cerebrovascular accident) (HCC) Code stroke was activated when patient was found to be aphasic with right-sided hemiparesis.  Initial CT head was negative but MRI did show moderate left MCA territory infarct predominantly affecting parietal lobe. Neurology was consulted. -EEG was negative for any epileptiform changes but did show encephalopathy which is focally worsened in stroke area -Did receive initial dose of Keppra  which was later discontinued after positive MRI     Acute heart failure with reduced ejection fraction (HFrEF, <= 40%) (HCC) 2D echo showed EF less than 20% and global hypokinesis.  BNP 1,009 Prior echo was done in October 2020 and it was normal.  Clinically appears euvolemic Cardiology has signed off     Rhabdomyolysis CK level is trending down.  1,[id number] Patient was found on ground-unknown time.      Bilateral leg pain ABI normal Continue analgesics    Displaced fracture of medial malleolus of right tibia, initial encounter for closed fracture Likely secondary to fall.  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 Continue analgesics    AKI (acute kidney injury) (HCC) Improved.  Creatinine down from 1.67-1.13. Renal ultrasound without any significant abnormality.  Likely multifactorial with ATN due to hypoxia and NSTEMI plus rhabdomyolysis     Comorbidities include hypertension, hypothyroidism, hypercholesterolemia, obesity, recurrent depression    Continue comfort measures.  She is on IV Dilaudid  drip.  Diet Order             Diet NPO time specified  Diet effective now                            Consultants: Cardiologist Neurologist Palliative care  Procedures: None    Medications:     stroke: early stages of recovery book   Does not apply Once    HYDROmorphone  (DILAUDID ) injection  1 mg Intravenous Once   lidocaine   2 patch Transdermal Q24H   mouth rinse  15 mL Mouth Rinse 4 times per day   polyethylene glycol  17 g Oral BID   Continuous Infusions:  HYDROmorphone  0.5 mg/hr (06/07/23 1054)     Anti-infectives (From admission, onward)    None  Family Communication/Anticipated D/C date and plan/Code Status   DVT prophylaxis:      Code Status: Do not attempt resuscitation (DNR) - Comfort care  Family Communication: Plan discussed with Blaise Bumps, daughter, at the bedside Disposition Plan: Plan to discharge to hospice house   Status is: Inpatient Remains inpatient appropriate because: Comfort care       Subjective:   Interval events noted.  Patient has sedated and unable to provide any history.  Blaise Bumps, daughter, was at the bedside.  She said patient seemed to be in a lot of pain yesterday but she is more comfortable and rested today.  Amber, RN, was also at the bedside  Objective:    Vitals:   06/06/23 1600 06/06/23 1714 06/06/23 2133 06/07/23 0732   BP: 124/74 111/67 (!) 150/91 122/68  Pulse: 86 85 87 74  Resp: (!) 26 18 18 15   Temp: (!) 97.5 F (36.4 C) 98.6 F (37 C) 99.2 F (37.3 C) 98.3 F (36.8 C)  TempSrc: Axillary  Oral Oral  SpO2: 93% 92% 97% 96%  Weight:      Height:       No data found.   Intake/Output Summary (Last 24 hours) at 06/07/2023 1150 Last data filed at 06/07/2023 0222 Gross per 24 hour  Intake --  Output 600 ml  Net -600 ml   Filed Weights   06/03/23 1253 06/05/23 0525 06/05/23 0738  Weight: 81.6 kg 84.5 kg 82.2 kg    Exam:  GEN: NAD SKIN: Warm and dry EYES: No pallor or icterus breasts ENT: MMM CV: RRR PULM: CTA B ABD: soft, ND, NT, +BS CNS: Sedated/? Unresponsive.  Does not follow commands.  Difficult to assess EXT: No edema or tenderness        Data Reviewed:   I have personally reviewed following labs and imaging studies:  Labs: Labs show the following:   Basic Metabolic Panel: Recent Labs  Lab 06/03/23 0909 06/04/23 0506 06/05/23 0437 06/06/23 0411 06/07/23 0455  NA 138 138 139 136 141  K 4.0 4.6 4.8 4.5 4.6  CL 104 110 108 107 112*  CO2 21* 20* 22 21* 23  GLUCOSE 223* 134* 133* 126* 96  BUN 33* 35* 38* 30* 26*  CREATININE 1.47* 1.52* 1.67* 1.23* 1.13*  CALCIUM  9.5 8.5* 8.4* 8.4* 8.7*  MG  --   --   --  2.3  --   PHOS  --   --   --  2.9  --    GFR Estimated Creatinine Clearance: 36 mL/min (A) (by C-G formula based on SCr of 1.13 mg/dL (H)). Liver Function Tests: Recent Labs  Lab 06/06/23 0411  ALBUMIN 3.2*   No results for input(s): "LIPASE", "AMYLASE" in the last 168 hours. No results for input(s): "AMMONIA" in the last 168 hours. Coagulation profile Recent Labs  Lab 06/03/23 1257 06/05/23 0437  INR 1.2 1.1    CBC: Recent Labs  Lab 06/03/23 0909 06/04/23 0506 06/05/23 0437 06/06/23 0411  WBC 17.8* 11.0* 8.0 7.3  HGB 13.6 12.7 11.9* 10.9*  HCT 41.2 38.2 37.3 33.9*  MCV 92.0 92.0 95.4 94.2  PLT 291 245 207 178   Cardiac Enzymes: Recent  Labs  Lab 06/03/23 0909 06/04/23 0506 06/04/23 1026 06/05/23 0437 06/06/23 0411  CKTOTAL 6,330* 16,109*  --  12,935* 12,563*  CKMB  --   --  239.8*  --   --    BNP (last 3 results) No results for input(s): "PROBNP" in the last 8760 hours.  CBG: Recent Labs  Lab 06/05/23 1939 06/05/23 2335 06/06/23 0335 06/06/23 0748 06/06/23 1153  GLUCAP 97 116* 120* 129* 130*   D-Dimer: No results for input(s): "DDIMER" in the last 72 hours. Hgb A1c: No results for input(s): "HGBA1C" in the last 72 hours. Lipid Profile: No results for input(s): "CHOL", "HDL", "LDLCALC", "TRIG", "CHOLHDL", "LDLDIRECT" in the last 72 hours. Thyroid  function studies: No results for input(s): "TSH", "T4TOTAL", "T3FREE", "THYROIDAB" in the last 72 hours.  Invalid input(s): "FREET3" Anemia work up: No results for input(s): "VITAMINB12", "FOLATE", "FERRITIN", "TIBC", "IRON ", "RETICCTPCT" in the last 72 hours. Sepsis Labs: Recent Labs  Lab 06/03/23 0909 06/04/23 0506 06/05/23 0437 06/06/23 0411  WBC 17.8* 11.0* 8.0 7.3    Microbiology Recent Results (from the past 240 hours)  Resp panel by RT-PCR (RSV, Flu A&B, Covid) Anterior Nasal Swab     Status: None   Collection Time: 06/03/23 10:08 AM   Specimen: Anterior Nasal Swab  Result Value Ref Range Status   SARS Coronavirus 2 by RT PCR NEGATIVE NEGATIVE Final    Comment: (NOTE) SARS-CoV-2 target nucleic acids are NOT DETECTED.  The SARS-CoV-2 RNA is generally detectable in upper respiratory specimens during the acute phase of infection. The lowest concentration of SARS-CoV-2 viral copies this assay can detect is 138 copies/mL. A negative result does not preclude SARS-Cov-2 infection and should not be used as the sole basis for treatment or other patient management decisions. A negative result may occur with  improper specimen collection/handling, submission of specimen other than nasopharyngeal swab, presence of viral mutation(s) within the areas  targeted by this assay, and inadequate number of viral copies(<138 copies/mL). A negative result must be combined with clinical observations, patient history, and epidemiological information. The expected result is Negative.  Fact Sheet for Patients:  BloggerCourse.com  Fact Sheet for Healthcare Providers:  SeriousBroker.it  This test is no t yet approved or cleared by the [place]  FDA and  has been authorized for detection and/or diagnosis of SARS-CoV-2 by FDA under an Emergency Use Authorization (EUA). This EUA will remain  in effect (meaning this test can be used) for the duration of the COVID-19 declaration under Section 564(b)(1) of the Act, 21 U.S.C.section 360bbb-3(b)(1), unless the authorization is terminated  or revoked sooner.       Influenza A by PCR NEGATIVE NEGATIVE Final   Influenza B by PCR NEGATIVE NEGATIVE Final    Comment: (NOTE) The Xpert Xpress SARS-CoV-2/FLU/RSV plus assay is intended as an aid in the diagnosis of influenza from Nasopharyngeal swab specimens and should not be used as a sole basis for treatment. Nasal washings and aspirates are unacceptable for Xpert Xpress SARS-CoV-2/FLU/RSV testing.  Fact Sheet for Patients: BloggerCourse.com  Fact Sheet for Healthcare Providers: SeriousBroker.it  This test is not yet approved or cleared by the [place]  FDA and has been authorized for detection and/or diagnosis of SARS-CoV-2 by FDA under an Emergency Use Authorization (EUA). This EUA will remain in effect (meaning this test can be used) for the duration of the COVID-19 declaration under Section 564(b)(1) of the Act, 21 U.S.C. section 360bbb-3(b)(1), unless the authorization is terminated or revoked.     Resp Syncytial Virus by PCR NEGATIVE NEGATIVE Final    Comment: (NOTE) Fact Sheet for  Patients: BloggerCourse.com  Fact Sheet for Healthcare Providers: SeriousBroker.it  This test is not yet approved or cleared by the [place]  FDA and has been authorized for detection and/or diagnosis of SARS-CoV-2 by FDA under an Emergency Use  Authorization (EUA). This EUA will remain in effect (meaning this test can be used) for the duration of the COVID-19 declaration under Section 564(b)(1) of the Act, 21 U.S.C. section 360bbb-3(b)(1), unless the authorization is terminated or revoked.  Performed at [facility], [address] [place], [place] [address]   MRSA Next Gen by PCR, Nasal     Status: None   Collection Time: 06/05/23  7:41 AM   Specimen: Nasal Mucosa; Nasal Swab  Result Value Ref Range Status   MRSA by PCR Next Gen NOT DETECTED NOT DETECTED Final    Comment: (NOTE) The GeneXpert MRSA Assay (FDA approved for NASAL specimens only), is one component of a comprehensive MRSA colonization surveillance program. It is not intended to diagnose MRSA infection nor to guide or monitor treatment for MRSA infections. Test performance is not FDA approved in patients less than 83 years old. Performed at [facility], [address] [place], [place] [address]     Procedures and diagnostic studies:  US  Venous Img Lower Bilateral (DVT) Result Date: 06/06/2023 CLINICAL DATA:  Leg pain EXAM: Bilateral Lower Extremity Venous Doppler Ultrasound TECHNIQUE: Gray-scale sonography with compression, as well as color and duplex ultrasound, were performed to evaluate the deep venous system(s) from the level of the common femoral vein through the popliteal and proximal calf veins. COMPARISON:  None available FINDINGS: VENOUS Normal compressibility of the common femoral, superficial femoral, and popliteal veins, as well as the visualized calf veins. Visualized portions of profunda femoral vein and great saphenous vein  unremarkable. No filling defects to suggest DVT on grayscale or color Doppler imaging. Doppler waveforms show normal direction of venous flow, normal respiratory plasticity and response to augmentation. OTHER None. Limitations: none IMPRESSION: No lower extremity DVT. Electronically Signed   By: Elester Grim M.D.   On: 06/06/2023 11:08               LOS: 4 days   Edie Vallandingham  [facility]   Pager on [email or web address]. If 7PM-7AM, please contact night-coverage at [email or web address]     06/07/2023, 11:50 AM

## 2023-06-07 NOTE — Progress Notes (Signed)
 Nutrition Brief Note  Chart reviewed. Pt now transitioning to comfort care.  No further nutrition interventions planned at this time.  Please re-consult as needed.   Levada Schilling, RD, LDN, CDCES Registered Dietitian III Certified Diabetes Care and Education Specialist If unable to reach this RD, please use "RD Inpatient" group chat on secure chat between hours of 8am-4 pm daily

## 2023-06-07 NOTE — Progress Notes (Signed)
   06/07/23 1530  Spiritual Encounters  Type of Visit Attempt (pt unavailable)  Care provided to: Pt not available  Conversation partners present during encounter Nurse  Reason for visit Routine spiritual support  OnCall Visit No   Chaplain visited patient per a Consult in [place] but patient was unavailable because she was sleeping.  Chaplain shared that she'd come to visit the patient and she was sleeping.    Rev. Rana M. Nolon Baxter, M.Div. Chaplain Resident [facility]

## 2023-06-07 NOTE — Plan of Care (Signed)
  Problem: Education: Goal: Knowledge of General Education information will improve Description: Including pain rating scale, medication(s)/side effects and non-pharmacologic comfort measures Outcome: Progressing   Problem: Health Behavior/Discharge Planning: Goal: Ability to manage health-related needs will improve Outcome: Progressing   Problem: Clinical Measurements: Goal: Ability to maintain clinical measurements within normal limits will improve Outcome: Progressing   Problem: Activity: Goal: Risk for activity intolerance will decrease Outcome: Progressing   Problem: Nutrition: Goal: Adequate nutrition will be maintained Outcome: Progressing   Problem: Elimination: Goal: Will not experience complications related to bowel motility Outcome: Progressing   Problem: Pain Managment: Goal: General experience of comfort will improve and/or be controlled Outcome: Progressing   Problem: Safety: Goal: Ability to remain free from injury will improve Outcome: Progressing   Problem: Skin Integrity: Goal: Risk for impaired skin integrity will decrease Outcome: Progressing   Problem: Education: Goal: Knowledge of disease or condition will improve Outcome: Progressing   Problem: Ischemic Stroke/TIA Tissue Perfusion: Goal: Complications of ischemic stroke/TIA will be minimized Outcome: Progressing   Problem: Coping: Goal: Will verbalize positive feelings about self Outcome: Progressing   Problem: Health Behavior/Discharge Planning: Goal: Ability to manage health-related needs will improve Outcome: Progressing   Problem: Self-Care: Goal: Ability to participate in self-care as condition permits will improve Outcome: Progressing   Problem: Nutrition: Goal: Risk of aspiration will decrease Outcome: Progressing

## 2023-06-07 NOTE — Progress Notes (Signed)
  Chaplain On-Call responded to Spiritual Care Consult Order from Sheril Dines, MD. The request noted "major life transition" and end-of life comfort care.  Chaplain attempted to visit but the patient was receiving bedside care from medical team at 1150 hours.  Chaplain will refer to Afternoon Chaplain for follow-up after shift change.  Chaplain Dean Every., [facility]

## 2023-06-07 NOTE — Care Management Important Message (Signed)
 Important Message  Patient Details  Name: Laurie Hale MRN: [id number] Date of Birth: 1940-05-13   Important Message Given:  Yes - Medicare IM     Gaylin Osoria W, CMA 06/07/2023, 10:49 AM

## 2023-06-07 NOTE — Progress Notes (Addendum)
 Daily Progress Note   Patient Name: Laurie Hale       Date: 06/07/2023 DOB: [birth year]  Age: 83 y.o. MRN#: [id number] Attending Physician: Sheril Dines, MD Primary Care Physician: Rory Collard, MD Admit Date: 06/03/2023  Reason for Consultation/Follow-up: Establishing goals of care  Subjective: Notes reviewed.  In to see patient.  Staff members are working with patient to change her sheets.  No family at bedside at this time.  Patient appears uncomfortable and is moaning.  Patient opens her eyes to look in my general direction but does not look at me or track, and does not attempt to interact or speak.  Single PRN dose of Dilaudid  provided during this time by nursing and patient appeared more comfortable.  Dilaudid  infusion ordered with bolus dosing.  Other comfort medications remain in place.   Anticipate hospital death.  Length of Stay: 4  Current Medications: Scheduled Meds:    stroke: early stages of recovery book   Does not apply Once    HYDROmorphone  (DILAUDID ) injection  1 mg Intravenous Once   lidocaine   2 patch Transdermal Q24H   mouth rinse  15 mL Mouth Rinse 4 times per day   polyethylene glycol  17 g Oral BID    Continuous Infusions:  HYDROmorphone  0.5 mg/hr (06/07/23 1054)    PRN Meds: acetaminophen  **OR** acetaminophen , antiseptic oral rinse, glycopyrrolate  **OR** glycopyrrolate  **OR** glycopyrrolate , haloperidol  **OR** haloperidol  **OR** haloperidol  lactate, HYDROmorphone , LORazepam  **OR** LORazepam  **OR** LORazepam , ondansetron  **OR** ondansetron  (ZOFRAN ) IV, mouth rinse, mouth rinse, polyvinyl alcohol   Physical Exam Constitutional:      Comments: Opens eyes spontaneously.  Appears uncomfortable.  Pulmonary:     Effort: Pulmonary effort is normal.  Skin:     General: Skin is warm and dry.             Vital Signs: BP 122/68 (BP Location: Left Arm)   Pulse 74   Temp 98.3 F (36.8 C) (Oral)   Resp 15   Ht 5\' 1"  (1.549 m)   Wt 82.2 kg   SpO2 96%   BMI 34.24 kg/m  SpO2: SpO2: 96 % O2 Device: O2 Device: Nasal Cannula O2 Flow Rate: O2 Flow Rate (L/min): 2 L/min  Intake/output summary:  Intake/Output Summary (Last 24 hours) at 06/07/2023 1216 Last data filed at 06/07/2023 0222 Gross per 24  hour  Intake --  Output 400 ml  Net -400 ml   LBM: Last BM Date : 06/03/23 Baseline Weight: Weight: 81.6 kg Most recent weight: Weight: 82.2 kg  Patient Active Problem List   Diagnosis Date Noted   Somnolence 06/05/2023   Acute CVA (cerebrovascular accident) (HCC) 06/05/2023   Acute heart failure with reduced ejection fraction (HFrEF, <= 40%) (HCC) 06/04/2023   Displaced fracture of medial malleolus of right tibia, initial encounter for closed fracture 06/04/2023   NSTEMI (non-ST elevated myocardial infarction) (HCC) 06/03/2023   Rhabdomyolysis 06/03/2023   Bilateral leg pain 06/03/2023   Iron  deficiency anemia 03/01/2022   Left leg pain 02/28/2022   Acute cystitis with hematuria 02/28/2022   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, stage 3 (HCC) 02/27/2022   Ruptured, bladder, spontaneous 02/26/2022   Hematoma of bladder wall 02/26/2022   Acute blood loss anemia 02/26/2022   Metabolic acidosis 02/26/2022   UTI (urinary tract infection) 02/25/2022   Hematuria 02/25/2022   Depression 02/25/2022   AKI (acute kidney injury) (HCC) 02/25/2022   Abnormal LFTs 02/25/2022   BV (bacterial vaginosis) 02/25/2022   Obesity (BMI 30-39.9) 02/25/2022   Elevated lipase 02/25/2022   Closed right ankle fracture 02/25/2022   Fall at home, initial encounter 02/25/2022   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 prolapse 10/24/2017   Depression, recurrent (HCC) 10/24/2017   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  Anticipate hospital death.  Code Status:    Code Status Orders  (From admission, onward)           Start     Ordered   06/07/23 1000  Do not attempt resuscitation (DNR) - Comfort care  Continuous       Question Answer Comment  If patient has no pulse and is not breathing Do Not Attempt Resuscitation   In Pre-Arrest Conditions (Patient Is Breathing and Has a Pulse) Provide comfort measures. Relieve any mechanical airway obstruction. Avoid transfer unless required for comfort.   Consent: Discussion documented in EHR or advanced directives reviewed      06/07/23 1003           Code Status History     Date Active Date Inactive Code Status Order ID Comments User Context   06/06/2023 1426 06/07/2023 1003 Do not attempt resuscitation (DNR) - Comfort care [id number]  Meribeth Standard, NP Inpatient   06/03/2023 1351 06/06/2023 1426 Full Code [id number]  Cedric Cohn, DO ED   02/25/2022 1133 03/03/2022 1647 DNR [id number]  Fidencio Hue, MD ED   02/25/2022 1044 02/25/2022 1133 Full Code [id number]  Fidencio Hue, MD ED   10/09/2017 1500 10/10/2017 1705 Full Code [id number]  Janeal Mealing, PA Inpatient       Thank you for allowing the Palliative Medicine Team to assist in the care of this patient.    Meribeth Standard, NP  Please contact Palliative Medicine Team phone at [phone] for questions and concerns.

## 2023-06-08 DIAGNOSIS — I214 Non-ST elevation (NSTEMI) myocardial infarction: Secondary | ICD-10-CM | POA: Diagnosis not present

## 2023-06-08 MED ORDER — GLYCOPYRROLATE 1 MG PO TABS: 2.0000 mg | ORAL_TABLET | ORAL | Status: DC | PRN

## 2023-06-08 MED ORDER — HYDROMORPHONE BOLUS VIA INFUSION: 1.0000 mg | INTRAVENOUS | Status: DC | PRN

## 2023-06-08 MED ORDER — GLYCOPYRROLATE 0.2 MG/ML IJ SOLN: 0.4000 mg | INTRAMUSCULAR | Status: DC | PRN

## 2023-06-15 NOTE — Progress Notes (Signed)
 [facility] here to transport Patient to hospice care. New bag of dilaudid  was recently hang, same wasted with Mary,Charge Nurse.

## 2023-06-15 NOTE — Progress Notes (Signed)
 [facility] Liaison Note  Received request from Transitions of Care Manger for family interest in [facility].  Eligibility has been confirmed.  Met with patient's daughter, Terri Claar,  to confirm interest and explain services. Daughter would like to wait a day or two before making a decision.  She stated that she believes that the patient is rapidly declining and anticipates a hospital death.   Transitions of Care manager aware.    Please call with any Hospice related questions or concerns  Thank you for the opportunity to participate in this patient's care.    Helmut Lobe,  [facility] Liaison [phone]

## 2023-06-15 NOTE — Discharge Summary (Signed)
 Physician Discharge Summary   Patient: Laurie Hale MRN: [id number] DOB: [birth year]  Admit date:     06/03/2023  Discharge date: 05/22/2023  Discharge Physician: Sheril Dines   PCP: Rory Collard, MD   Recommendations at discharge:   Follow-up with the hospice team at the hospice house  Discharge Diagnoses: Principal Problem:   NSTEMI (non-ST elevated myocardial infarction) [facility]) Active Problems:   Rhabdomyolysis   Acute heart failure with reduced ejection fraction (HFrEF, <= 40%) (HCC)   Acute CVA (cerebrovascular accident) (HCC)   Bilateral leg pain   AKI (acute kidney injury) (HCC)   Displaced fracture of medial malleolus of right tibia, initial encounter for closed fracture   Essential hypertension, benign   Hypercholesterolemia   Hypothyroidism   Obesity (BMI 30-39.9)   Depression, recurrent (HCC)   GERD (gastroesophageal reflux disease)   S/P total knee replacement   Iron  deficiency anemia   Somnolence  Resolved Problems:   * No resolved hospital problems. [facility] Course:  [name] is an 83 year old female with history of hypertension, hyperlipidemia, chronic back pain, hypothyroidism, who was brought to the emergency department after a fall at home.  She was found on the floor at home and it was not clear how long she had been laying on the floor.  Circumstances surrounding the fall was unclear. Last known normal was the night prior to being found at home.  She had almost completed antibiotics for a UTI before coming to the hospital.     Per ED nursing documentation, EMS gave patient aspirin  325 mg p.o. one-time dose, sodium chloride  500 mL liter bolus.   Vitals in the ED showed T of 97.6, rr 22, hr 100, blood pressure 113/74, SpO2 95% on 2 L nasal cannula.   Serum sodium is 138, potassium 4.0, chloride 104, bicarb 21, BUN of 33, serum creatinine 1.47, EGFR 35, nonfasting blood glucose 123, WBC 17.8, hemoglobin 13.6, platelets of 291.   CK was  elevated at 6330. High sensitive troponin was 3186.  COVID/influenza A/influenza B/RSV PCR were negative.   ED treatment: Fentanyl  50 mcg IV, 2 doses were given, heparin  bolus and gtt. were initiated, sodium chloride  1 L bolus.   4/20: Vital stable, overnight significant bilateral leg pain and an ankle abrasion noted, imaging of lumbar spine and bilateral lower extremities Shows a mildly displaced fracture of medial malleolus. UA with likely myoglobin, and rare bacteria.  Improving leukocytosis now at 11, bicarb of 20, BUN 35 with slight worsening of creatinine to 1.52-baseline seems to be normal.  Worsening CK and troponin.  Patient seems very restless although complaining more about leg pain. 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  CK-MB was elevated at 239.8   4/21: Code stroke was activated overnight when patient was found aphasic, right-sided weakness, right facial droop and gaze preference.  CT head was without any acute abnormality.  CTA head and neck was negative for large vessel occlusion but did show high-grade stenosis/near occlusion of the left PCA P2 segment and also shows an abnormal oligemia and possible small infarct.  Neurology evaluated her and her symptoms did not match to those findings.  Unwitnessed seizure was another consideration so she was loaded with Keppra , heparin  was held and patient was transferred to stepdown for close monitoring. MRI brain was positive for wedge-shaped posterior left MCA territory infarct, predominantly affecting parietal lobe. Labs with resolved leukocytosis, creatinine continue to get worse now  at 1.67, improving CK now at 12,935, troponin started trending down at 8592. ABI was normal bilaterally Neurology stopped Keppra , holding heparin  infusion.   Patient with multiorgan dysfunction and critically ill-palliative care was also consulted.  She is high risk for mortality.   4/22: Blood  pressure started trending up, saturating well on 5 L of oxygen.  EEG was negative for epileptiform discharges, did show a focal area of cerebral dysfunction in the left posterior quadrant in addition to the more generalized nonspecific cerebral dysfunction, correlates with recent stroke.  Labs with improving troponin, currently at 3,223, [place] [address], bicarb 21, creatinine started improving, at 1.23, hemoglobin decreased to 10.9 but all cell line decreased.  Patient remained quite encephalopathic, some movement in right lower extremity, no movement of right upper extremity.   Palliative care meeting with family and they decided to transition her to full comfort care.  Comfort care measures were initiated.         Assessment and Plan: NSTEMI (non-ST elevated myocardial infarction) (HCC) No report of chest pain. Echocardiogram with new diagnosis of HFrEF with EF less than 20% and global hypokinesis.  Troponin peaked at 10,962 CK-MB also elevated Cardiology signed off S/p IV heparin  drip.     Acute CVA (cerebrovascular accident) (HCC) Code stroke was activated when patient was found to be aphasic with right-sided hemiparesis.  Initial CT head was negative but MRI did show moderate left MCA territory infarct predominantly affecting parietal lobe. Neurology was consulted. -EEG was negative for any epileptiform changes but did show encephalopathy which is focally worsened in stroke area -Did receive initial dose of Keppra  which was later discontinued after positive MRI       Acute heart failure with reduced ejection fraction (HFrEF, <= 40%) (HCC) 2D echo showed EF less than 20% and global hypokinesis.  BNP 1,009 Prior echo was done in October 2020 and it was normal.  Clinically appears euvolemic Cardiology has signed off     Rhabdomyolysis CK level is trending down.  0,[id number] Patient was found on ground-unknown time.       Bilateral leg pain ABI normal Continue  analgesics     Displaced fracture of medial malleolus of right tibia, initial encounter for closed fracture Likely secondary to fall.  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 Continue analgesics     AKI (acute kidney injury) (HCC) Improved.  Creatinine down from 1.67-1.13. Renal ultrasound without any significant abnormality.  Likely multifactorial with ATN due to hypoxia and NSTEMI plus rhabdomyolysis       Comorbidities include hypertension, hypothyroidism, hypercholesterolemia, obesity, recurrent depression        Consultants: Cardiologist, neurologist, palliative care, hospice Procedures performed: None Disposition: Hospice care Diet recommendation:  Discharge Diet Orders (From admission, onward)     Start     Ordered   06/07/2023 0000  Diet - low sodium heart healthy        06/14/2023 1613           NPO   DISCHARGE MEDICATION: Allergies as of 06/13/2023       Reactions   Ciprofloxacin    Penicillins Hives            Medication List     STOP taking these medications    amLODipine  10 MG tablet Commonly known as: NORVASC    hydrochlorothiazide  25 MG tablet Commonly known as:  HYDRODIURIL    levothyroxine  88 MCG tablet Commonly known as: SYNTHROID    lisinopril  40 MG tablet Commonly known as: ZESTRIL    rosuvastatin  20 MG tablet Commonly known as: CRESTOR    sertraline  100 MG tablet Commonly known as: ZOLOFT         Follow-up Information     Custovic, Sabina, DO. Go in 1 week(s).   Specialty: Cardiology Contact information: [address] [place] [address] [phone]                Discharge Exam: Cleavon Curls Weights   06/03/23 1253 06/05/23 0525 06/05/23 0738  Weight: 81.6 kg 84.5 kg 82.2 kg   GEN: NAD SKIN: Warm and dry EYES: No acute abnormality ENT: MMM CV: RRR PULM: Bibasilar  rales.  No wheezing ABD: soft, nondistended CNS: Unresponsive/sleepy EXT: No edema    Condition at discharge: poor  The results of significant diagnostics from this hospitalization (including imaging, microbiology, ancillary and laboratory) are listed below for reference.   Imaging Studies: US  Venous Img Lower Bilateral (DVT) Result Date: 06/06/2023 CLINICAL DATA:  Leg pain EXAM: Bilateral Lower Extremity Venous Doppler Ultrasound TECHNIQUE: Gray-scale sonography with compression, as well as color and duplex ultrasound, were performed to evaluate the deep venous system(s) from the level of the common femoral vein through the popliteal and proximal calf veins. COMPARISON:  None available FINDINGS: VENOUS Normal compressibility of the common femoral, superficial femoral, and popliteal veins, as well as the visualized calf veins. Visualized portions of profunda femoral vein and great saphenous vein unremarkable. No filling defects to suggest DVT on grayscale or color Doppler imaging. Doppler waveforms show normal direction of venous flow, normal respiratory plasticity and response to augmentation. OTHER None. Limitations: none IMPRESSION: No lower extremity DVT. Electronically Signed   By: Elester Grim M.D.   On: 06/06/2023 11:08   MR BRAIN W WO CONTRAST Result Date: 06/05/2023 CLINICAL DATA:  Acute neurologic deficit. Right-sided weakness and aphasia EXAM: MRI HEAD WITHOUT AND WITH CONTRAST TECHNIQUE: Multiplanar, multiecho pulse sequences of the brain and surrounding structures were obtained without and with intravenous contrast. CONTRAST:  8mL GADAVIST  GADOBUTROL  1 MMOL/ML IV SOLN COMPARISON:  CTA head neck 06/05/2023 FINDINGS: Brain: Wedge-shaped area of acute infarction within the posterior left MCA territory, predominantly affecting the parietal lobe. No acute or chronic hemorrhage. There is multifocal hyperintense T2-weighted signal within the white matter. Generalized volume loss. The midline  structures are normal. Vascular: Normal flow voids. Skull and upper cervical spine: Normal calvarium and skull base. Visualized upper cervical spine and soft tissues are normal. Sinuses/Orbits:No paranasal sinus fluid levels or advanced mucosal thickening. No mastoid or middle ear effusion. Normal orbits. IMPRESSION: Wedge-shaped area of acute infarction within the posterior left MCA territory, predominantly affecting the parietal lobe. No hemorrhage or mass effect. Electronically Signed   By: Juanetta Nordmann M.D.   On: 06/05/2023 19:12   US  RENAL Result Date: 06/05/2023 CLINICAL DATA:  AKI EXAM: RENAL / URINARY TRACT ULTRASOUND COMPLETE COMPARISON:  None Available. FINDINGS: Right Kidney: Renal measurements: 10.7 x 4.9 x 9.2 cm = volume: 144 mL. Echogenicity within normal limits. No mass or hydronephrosis visualized. Left Kidney: Renal measurements: 10.2 x 4.5 x 4.8 cm = volume: 117 mL. Echogenicity within normal limits. No mass or hydronephrosis visualized. Bladder: Appears normal for degree of bladder distention. Other: None. IMPRESSION: No collecting system dilatation. Electronically Signed   By: Adrianna Horde M.D.   On: 06/05/2023 13:29   CT ANGIO HEAD NECK W WO CM W PERF (  CODE STROKE) Result Date: 06/05/2023 CLINICAL DATA:  83 year old female code stroke presentation. EXAM: CT ANGIOGRAPHY HEAD AND NECK CT PERFUSION BRAIN TECHNIQUE: Multidetector CT imaging of the head and neck was performed using the standard protocol during bolus administration of intravenous contrast. Multiplanar CT image reconstructions and MIPs were obtained to evaluate the vascular anatomy. Carotid stenosis measurements (when applicable) are obtained utilizing NASCET criteria, using the distal internal carotid diameter as the denominator. Multiphase CT imaging of the brain was performed following IV bolus contrast injection. Subsequent parametric perfusion maps were calculated using RAPID software. RADIATION DOSE REDUCTION: This exam  was performed according to the departmental dose-optimization program which includes automated exposure control, adjustment of the mA and/or kV according to patient size and/or use of iterative reconstruction technique. CONTRAST:  60mL OMNIPAQUE  IOHEXOL  350 MG/ML SOLN COMPARISON:  Plain head CT 0412 hours today. FINDINGS: CT Brain Perfusion Findings: Mildly motion degraded. CBF (<30%) Volume: 7mL Perfusion (Tmax>6.0s) volume: 6mL, with T-max greater than 10 seconds of 5 mL and hypoperfusion index of 0.8 Mismatch Volume: None apparent Infarction Location:Left PCA territory CTA NECK Skeleton: Cervical ACDF C4-C5 and C5-C6. Pseudoarthrosis at the latter. Underlying advanced cervical spine degeneration. Degenerative appearing C2-C3 ankylosis. No acute osseous abnormality identified. Upper chest: Layering right pleural effusion with simple fluid density appears to be transudate, up to moderate. Patchy right lung atelectasis associated. Lung apices relatively clear. Negative visible superior mediastinum. Other neck: Retropharyngeal course of the carotids. Other nonvascular neck soft tissue spaces are within normal limits. Aortic arch: Calcified aortic atherosclerosis.  3 vessel arch. Right carotid system: Brachiocephalic artery plaque without stenosis. Mildly tortuous right CCA. Partially retropharyngeal course. Moderate calcified plaque at the left ICA origin and bulb but less than 50 % stenosis with respect to the distal vessel. Left carotid system: Minimal plaque at the left CCA origin. Mild to moderate soft and calcified plaque at the left carotid bifurcation, proximal left ICA without stenosis. Tortuous and retropharyngeal left ICA distal to the bulb. Vertebral arteries: Mild plaque at the right subclavian artery origin without stenosis. Calcified plaque at the right vertebral artery origin without stenosis. Additional right V1 mild calcified plaque and tortuosity without stenosis. Right vertebral artery is patent to  the skull base. Proximal left subclavian artery soft and calcified plaque without stenosis. Calcified plaque at the left vertebral artery origin with mild stenosis on series 8, image 131. Tortuous left V1 segment. Codominant left vertebral artery is patent to the skull base without additional plaque. CTA HEAD Posterior circulation: Left vertebral V4 segment mild calcified plaque without stenosis to the vertebrobasilar junction. Right vertebral V4 segment mild to moderate calcified plaque with mild stenosis series 5, image 263. Patent right PICA origin and vertebrobasilar junction. Patent basilar artery, dominant appearing left AICA, SCA and left PCA origins. No basilar stenosis. Fetal type right PCA origin. Left posterior communicating artery diminutive or absent. Right PCA branches are patent with mild P2 segment irregularity, mild stenosis. There are tandem moderate to severe stenoses of the left PCA P2 segment (series 7, image 22), the worst visible on series 14, image 20 and high-grade with near occlusion. However, there is maintained distal left PCA branch enhancement. Anterior circulation: Both ICA siphons are patent with calcified atherosclerosis. Left siphon calcified plaque is moderate, with mild stenosis at the anterior genu. Right siphon calcified plaque is moderate but there is no convincing right siphon stenosis. Right posterior communicating artery origin is normal. Patent carotid termini, MCA and ACA origins. Anterior communicating artery, bilateral ACA  branches are within normal limits. Left MCA M1 segment is tortuous. Left MCA bifurcation is patent without stenosis. Right MCA M1 segment and bifurcation are patent without stenosis. Bilateral MCA branches are within normal limits. Venous sinuses: Patent. Anatomic variants: Fetal right PCA origin.  Dominant left AICA. Review of the MIP images confirms the above findings IMPRESSION: 1. Negative for large vessel occlusion but Positive for High-grade  Stenoses/near-occlusion of the Left PCA P2 segment, and that territory registers abnormal oligemia and possible small infarct core on CT Perfusion (7 mL). 2. Other atherosclerosis in the head and neck without hemodynamically significant stenosis. 3. Layering right pleural effusion with right lung atelectasis. Aortic Atherosclerosis (ICD10-I70.0). #1 discussed by telephone with Dr. Ilean Mall on 06/05/2023 at 05:29. He advises that on exam she has significant deficits of a left MCA syndrome, discordant with the findings here. But we did discuss the possibility of acute on chronic left thalamic ischemia (left thalamostriate artery territory) and/or seizure with Todd's paralysis. Electronically Signed   By: Marlise Simpers M.D.   On: 06/05/2023 05:35   CT HEAD CODE STROKE WO CONTRAST Result Date: 06/05/2023 CLINICAL DATA:  Code stroke. 83 year old female with loss of speech. Recent fall, head trauma. EXAM: CT HEAD WITHOUT CONTRAST TECHNIQUE: Contiguous axial images were obtained from the base of the skull through the vertex without intravenous contrast. RADIATION DOSE REDUCTION: This exam was performed according to the departmental dose-optimization program which includes automated exposure control, adjustment of the mA and/or kV according to patient size and/or use of iterative reconstruction technique. COMPARISON:  Brain MRI 12/10/2014.  Head CT 06/03/2023. FINDINGS: Brain: Stable cerebral volume. 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 Dilated perivascular space of the inferior lentiform bilaterally, more apparent on the right today, but was present in 2016 (coronal image 30) and stable from head CT last year. No midline shift, ventriculomegaly, mass effect, evidence of mass lesion, intracranial hemorrhage or evidence of cortically based acute infarction. Vascular: Calcified atherosclerosis at the skull base. No suspicious intracranial vascular hyperdensity. Skull: Stable and intact. Sinuses/Orbits:  Visualized paranasal sinuses and mastoids are stable and well aerated. Other: No acute orbit or scalp soft tissue finding. ASPECTS [facility] Stroke Program Early CT Score) Total score (0-10 with 10 being normal): 10 IMPRESSION: 1. No acute cortically based infarct or acute intracranial hemorrhage identified. ASPECTS 10. 2. Stable non contrast CT appearance of chronic small vessel disease. Electronically Signed   By: Marlise Simpers M.D.   On: 06/05/2023 04:23   US  ARTERIAL ABI (SCREENING LOWER EXTREMITY) Result Date: 06/04/2023 CLINICAL DATA:  40981 Leg pain [id number] Peripheral arterial disease (HCC) 130865 EXAM: NONINVASIVE PHYSIOLOGIC VASCULAR STUDY OF BILATERAL LOWER EXTREMITIES TECHNIQUE: Evaluation of both lower extremities were performed at rest, including calculation of ankle-brachial indices with single level Doppler, pressure and pulse volume recording. COMPARISON:  None Available. FINDINGS: Right ABI:  1.1 Left ABI:  1.0 Right Lower Extremity:  Normal arterial waveforms at the ankle. Left Lower Extremity:  Normal arterial waveforms at the ankle. 1.0-1.4 Normal IMPRESSION: Normal bilateral resting ankle-brachial indices. Electronically Signed   By: Fernando Hoyer M.D.   On: 06/04/2023 15:02   DG Tibia/Fibula Right Result Date: 06/04/2023 CLINICAL DATA:  Elwin Hammond no fracture. EXAM: RIGHT TIBIA AND FIBULA - 2 VIEW COMPARISON:  Right ankle radiographs 06/04/2023 FINDINGS: Redemonstration of transverse fracture of the superior medial malleolus as seen on right ankle radiographs earlier same day. Status post total right knee arthroplasty. No perihardware lucency is seen. No acute  fracture is seen within the more proximal aspect of the tibia or fibula. Redemonstration of likely old healed fracture of the distal fibular diaphysis and metaphysis. IMPRESSION: 1. Redemonstration of transverse fracture of the superior medial malleolus as seen on right ankle radiographs earlier same day. 2. No acute fracture is seen  within the more proximal aspect of the tibia or fibula. Electronically Signed   By: Bertina Broccoli M.D.   On: 06/04/2023 14:27   ECHOCARDIOGRAM COMPLETE Result Date: 06/04/2023    ECHOCARDIOGRAM REPORT   Patient Name:   VANNIE HILGERT Date of Exam: 06/03/2023 Medical Rec #:  [id number]   Height:       61.0 in Accession #:    [id number]  Weight:       179.8 lb Date of Birth:  Sep 13, 1940   BSA:          1.805 m Patient Age:    82 years    BP:           121/89 mmHg Patient Gender: F           HR:           89 bpm. Exam Location:  [facility] Procedure: 2D Echo, Cardiac Doppler, Color Doppler and Strain Analysis (Both            Spectral and Color Flow Doppler were utilized during procedure). Indications:     NSTEMI I21.4  History:         Patient has no prior history of Echocardiogram examinations.                  CAD; Risk Factors:Hypertension.  Sonographer:     Kathaleen Pale Roar Referring Phys:  [id number] AMY N COX Diagnosing Phys: Lanell Pinta Custovic  Sonographer Comments: Global longitudinal strain was attempted. IMPRESSIONS  1. Left ventricular ejection fraction, by estimation, is <20%. The left ventricle has severely decreased function. The left ventricle demonstrates global hypokinesis. The left ventricular internal cavity size was mildly to moderately dilated. Left ventricular diastolic parameters are consistent with Grade I diastolic dysfunction (impaired relaxation).  2. Right ventricular systolic function is normal. The right ventricular size is normal.  3. The mitral valve is normal in structure. Mild mitral valve regurgitation. No evidence of mitral stenosis.  4. Tricuspid valve regurgitation is moderate.  5. The aortic valve is normal in structure. Aortic valve regurgitation is trivial. No aortic stenosis is present.  6. The inferior vena cava is normal in size with greater than 50% respiratory variability, suggesting right atrial pressure of 3 mmHg. FINDINGS  Left Ventricle: Left ventricular ejection fraction, by  estimation, is <20%. The left ventricle has severely decreased function. The left ventricle demonstrates global hypokinesis. The left ventricular internal cavity size was mildly to moderately dilated. There is no left ventricular hypertrophy. Left ventricular diastolic parameters are consistent with Grade I diastolic dysfunction (impaired relaxation). Right Ventricle: The right ventricular size is normal. No increase in right ventricular wall thickness. Right ventricular systolic function is normal. Left Atrium: Left atrial size was normal in size. Right Atrium: Right atrial size was normal in size. Pericardium: There is no evidence of pericardial effusion. Mitral Valve: The mitral valve is normal in structure. Mild mitral valve regurgitation. No evidence of mitral valve stenosis. MV peak gradient, 2.7 mmHg. The mean mitral valve gradient is 1.0 mmHg. Tricuspid Valve: The tricuspid valve is normal in structure. Tricuspid valve regurgitation is moderate. Aortic Valve: The aortic valve is normal in structure. Aortic valve regurgitation is  trivial. Aortic regurgitation PHT measures 331 msec. No aortic stenosis is present. Aortic valve mean gradient measures 3.0 mmHg. Aortic valve peak gradient measures 5.1  mmHg. Aortic valve area, by VTI measures 0.89 cm. Pulmonic Valve: The pulmonic valve was normal in structure. Pulmonic valve regurgitation is not visualized. Aorta: The aortic root is normal in size and structure. Venous: The inferior vena cava is normal in size with greater than 50% respiratory variability, suggesting right atrial pressure of 3 mmHg. IAS/Shunts: No atrial level shunt detected by color flow Doppler.  LEFT VENTRICLE PLAX 2D LVIDd:         5.00 cm     Diastology LVIDs:         4.40 cm     LV e' medial:    3.45 cm/s LV PW:         0.90 cm     LV E/e' medial:  9.6 LV IVS:        1.30 cm     LV e' lateral:   3.75 cm/s LVOT diam:     1.70 cm     LV E/e' lateral: 8.8 LV SV:         15 LV SV Index:   9  LVOT Area:     2.27 cm  LV Volumes (MOD) LV vol d, MOD A2C: 83.5 ml LV vol d, MOD A4C: 91.6 ml LV vol s, MOD A2C: 57.5 ml LV vol s, MOD A4C: 69.7 ml LV SV MOD A2C:     26.0 ml LV SV MOD A4C:     91.6 ml LV SV MOD BP:      22.8 ml RIGHT VENTRICLE RV Basal diam:  2.90 cm RV Mid diam:    2.20 cm RV S prime:     11.90 cm/s TAPSE (M-mode): 1.8 cm LEFT ATRIUM             Index        RIGHT ATRIUM           Index LA diam:        3.20 cm 1.77 cm/m   RA Area:     14.00 cm LA Vol (A2C):   51.1 ml 28.31 ml/m  RA Volume:   36.00 ml  19.94 ml/m LA Vol (A4C):   35.8 ml 19.83 ml/m LA Biplane Vol: 43.9 ml 24.32 ml/m  AORTIC VALVE                    PULMONIC VALVE AV Area (Vmax):    0.93 cm     PV Vmax:          0.75 m/s AV Area (Vmean):   0.88 cm     PV Peak grad:     2.3 mmHg AV Area (VTI):     0.89 cm     PR End Diast Vel: 2.57 msec AV Vmax:           113.00 cm/s  RVOT Peak grad:   1 mmHg AV Vmean:          82.900 cm/s AV VTI:            0.172 m AV Peak Grad:      5.1 mmHg AV Mean Grad:      3.0 mmHg LVOT Vmax:         46.50 cm/s LVOT Vmean:        32.100 cm/s LVOT VTI:          0.068 m  LVOT/AV VTI ratio: 0.39 AI PHT:            331 msec  AORTA Ao Root diam: 2.40 cm Ao Asc diam:  3.40 cm MITRAL VALVE               TRICUSPID VALVE MV Area (PHT): 7.44 cm    TR Peak grad:   36.0 mmHg MV Area VTI:   1.23 cm    TR Vmax:        300.00 cm/s MV Peak grad:  2.7 mmHg MV Mean grad:  1.0 mmHg    SHUNTS MV Vmax:       0.82 m/s    Systemic VTI:  0.07 m MV Vmean:      50.0 cm/s   Systemic Diam: 1.70 cm MV Decel Time: 102 msec MV E velocity: 33.00 cm/s MV A velocity: 82.80 cm/s MV E/A ratio:  0.40 MV A Prime:    9.5 cm/s Designer, multimedia signed by Isabell Manzanilla Signature Date/Time: 06/04/2023/11:16:48 AM    Final    DG Lumbar Spine 2-3 Views Result Date: 06/04/2023 CLINICAL DATA:  Fall.  Pain. EXAM: LUMBAR SPINE - 2-3 VIEW COMPARISON:  Lumbar spine radiographs 06/14/2010 FINDINGS: There is extensive transpedicular  rod and screw fusion hardware from the lower thoracic spine through the S1 level. Associated L3-4 and L5-S1 intervertebral disc spacers. Grade 1 anterolisthesis of L3 on L4 is better seen on prior CT. Vertebral body heights appear maintained. Moderate multilevel native disc space narrowing. Within the limitations of low bone mineralization, no definite acute fracture is seen. Surgical clips overlie the pelvis. IMPRESSION: 1. Extensive transpedicular rod and screw fusion hardware from the lower thoracic spine through the S1 level. 2. No definite acute fracture is seen. Electronically Signed   By: Bertina Broccoli M.D.   On: 06/04/2023 09:55   DG Knee 1-2 Views Right Result Date: 06/04/2023 CLINICAL DATA:  Fall. EXAM: RIGHT KNEE - 1-2 VIEW COMPARISON:  Right knee radiographs 03/31/2011 FINDINGS: Interval total right knee arthroplasty. No perihardware lucency is seen to indicate hardware failure or loosening. There is diffuse decreased bone mineralization. Within this limitation, no acute fracture is seen. Possible tiny joint effusion. Mild to moderate atherosclerotic calcifications. IMPRESSION: 1. Interval total right knee arthroplasty without evidence of hardware failure. 2. No acute fracture. Electronically Signed   By: Bertina Broccoli M.D.   On: 06/04/2023 09:53   DG Ankle 2 Views Right Result Date: 06/04/2023 CLINICAL DATA:  Fall.  Pain. EXAM: RIGHT ANKLE - 2 VIEW COMPARISON:  None Available. FINDINGS: There is diffuse decreased bone mineralization. There is a transverse fracture of the superior aspect of the medial malleolus with up to 3 mm lateral displacement of the distal fracture component with respect to the proximal fracture component. Likely old healed fracture of the distal fibular diaphysis with minimal cortical step-off on lateral view. The ankle mortise is symmetric and intact. IMPRESSION: 1. Acute mildly displaced fracture of the medial malleolus. 2. Likely old healed fracture of the distal fibular  diaphysis. Electronically Signed   By: Bertina Broccoli M.D.   On: 06/04/2023 09:52   DG Ankle 2 Views Left Result Date: 06/04/2023 CLINICAL DATA:  Fall.  Pain. EXAM: LEFT ANKLE - 2 VIEW COMPARISON:  None Available. FINDINGS: There is diffuse decreased bone mineralization. The ankle mortise is symmetric and intact. Mild distal medial and lateral malleolar degenerative spurs. No acute fracture is seen. No dislocation. IMPRESSION: 1. No acute fracture. 2. Mild distal medial and lateral  malleolar degenerative spurs. Electronically Signed   By: Bertina Broccoli M.D.   On: 06/04/2023 09:19   DG Knee 1-2 Views Left Result Date: 06/04/2023 CLINICAL DATA:  Fall.  Pain. EXAM: LEFT KNEE - 1-2 VIEW COMPARISON:  None Available. FINDINGS: Status post total left knee arthroplasty. No perihardware lucency is seen to indicate hardware failure or loosening. No joint effusion. No acute fracture is seen. No dislocation. IMPRESSION: Status post total left knee arthroplasty without evidence of hardware failure. Electronically Signed   By: Bertina Broccoli M.D.   On: 06/04/2023 09:04   CT Cervical Spine Wo Contrast Result Date: 06/03/2023 CLINICAL DATA:  Fall with trauma to the head and neck. EXAM: CT CERVICAL SPINE WITHOUT CONTRAST TECHNIQUE: Multidetector CT imaging of the cervical spine was performed without intravenous contrast. Multiplanar CT image reconstructions were also generated. RADIATION DOSE REDUCTION: This exam was performed according to the departmental dose-optimization program which includes automated exposure control, adjustment of the mA and/or kV according to patient size and/or use of iterative reconstruction technique. COMPARISON:  02/25/2022 FINDINGS: Alignment: Chronic degenerative anterolisthesis at C3-4 of 4 mm. Chronic degenerative anterolisthesis at C7-T1 of 4 mm. Skull base and vertebrae: No regional fracture or focal bone lesion. Chronic auto fusion at the C2-3 level. Chronic surgical fusion from C5 through  C7. Fusion also at the C 4 5 level could be auto fusion or surgical fusion but without hardware. Soft tissues and spinal canal: No traumatic soft tissue finding. Disc levels:  No stenosis at C2-3 or above. C3-4: Advanced facet arthropathy with 4 mm of degenerative anterolisthesis. Right foraminal stenosis could affect the right C4 nerve. No compressive stenosis in the region from C4-C7. Advanced facet arthropathy at C7-T1 with 4 mm of degenerative anterolisthesis. Potential for compression or irritation of either C8 nerve. Upper chest: Negative Other: None IMPRESSION: 1. No acute or traumatic finding. Chronic auto fusion at C2-3. Chronic surgical fusion from C5 through C7. Fusion also at the C4-5 level could be auto fusion or surgical fusion but without hardware. 2. Advanced facet arthropathy at C3-4 with 4 mm of degenerative anterolisthesis. Right foraminal stenosis could affect the right C4 nerve. 3. Advanced facet arthropathy at C7-T1 with 4 mm of degenerative anterolisthesis. Potential for compression or irritation of either C8 nerve. Electronically Signed   By: Bettylou Brunner M.D.   On: 06/03/2023 11:38   CT HEAD WO CONTRAST ( ) Result Date: 06/03/2023 CLINICAL DATA:  Fall with trauma to the head EXAM: CT HEAD WITHOUT CONTRAST TECHNIQUE: Contiguous axial images were obtained from the base of the skull through the vertex without intravenous contrast. RADIATION DOSE REDUCTION: This exam was performed according to the departmental dose-optimization program which includes automated exposure control, adjustment of the mA and/or kV according to patient size and/or use of iterative reconstruction technique. COMPARISON:  02/25/2022 FINDINGS: Brain: Age related volume loss. Chronic small-vessel ischemic changes of the cerebral hemispheric white matter. 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 No sign of acute infarction, mass lesion, hemorrhage, hydrocephalus or extra-axial collection. Vascular: There is  atherosclerotic calcification of the major vessels at the base of the brain. Skull: Negative Sinuses/Orbits: Clear/normal Other: None IMPRESSION: No acute or traumatic finding. Age related volume loss. Chronic small-vessel ischemic changes of the cerebral hemispheric white matter. 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 Electronically Signed   By: Bettylou Brunner M.D.   On: 06/03/2023 11:35   DG Pelvis 1-2 Views Result Date: 06/03/2023 CLINICAL DATA:  Recent fall EXAM: PELVIS - 1-2  VIEW COMPARISON:  CT 04/06/2022 FINDINGS: Distant lumbosacral fusion procedure. Surgical clips overlie the central pelvis. No evidence of acute regional fracture. Chronic sacroiliac degenerative changes are noted. IMPRESSION: No acute or traumatic finding. Distant lumbosacral fusion procedure. Chronic sacroiliac degenerative changes. Electronically Signed   By: Bettylou Brunner M.D.   On: 06/03/2023 11:32   DG Chest Portable 1 View Result Date: 06/03/2023 CLINICAL DATA:  Shortness of breath. EXAM: PORTABLE CHEST 1 VIEW COMPARISON:  01/02/2012 FINDINGS: The heart size and mediastinal contours are unremarkable. Low lung volumes with asymmetric elevation of right hemidiaphragm. No significant pleural effusion. Atelectasis noted within the left base. Postoperative changes noted within the lumbar spine and lower cervical vertebra. IMPRESSION: Low lung volumes with left base atelectasis. Electronically Signed   By: Kimberley Penman M.D.   On: 06/03/2023 11:02    Microbiology: Results for orders placed or performed during the hospital encounter of 06/03/23  Resp panel by RT-PCR (RSV, Flu A&B, Covid) Anterior Nasal Swab     Status: None   Collection Time: 06/03/23 10:08 AM   Specimen: Anterior Nasal Swab  Result Value Ref Range Status   SARS Coronavirus 2 by RT PCR NEGATIVE NEGATIVE Final    Comment: (NOTE) SARS-CoV-2 target nucleic acids are NOT DETECTED.  The SARS-CoV-2 RNA is generally detectable in upper respiratory specimens  during the acute phase of infection. The lowest concentration of SARS-CoV-2 viral copies this assay can detect is 138 copies/mL. A negative result does not preclude SARS-Cov-2 infection and should not be used as the sole basis for treatment or other patient management decisions. A negative result may occur with  improper specimen collection/handling, submission of specimen other than nasopharyngeal swab, presence of viral mutation(s) within the areas targeted by this assay, and inadequate number of viral copies(<138 copies/mL). A negative result must be combined with clinical observations, patient history, and epidemiological information. The expected result is Negative.  Fact Sheet for Patients:  BloggerCourse.com  Fact Sheet for Healthcare Providers:  SeriousBroker.it  This test is no t yet approved or cleared by the [place]  FDA and  has been authorized for detection and/or diagnosis of SARS-CoV-2 by FDA under an Emergency Use Authorization (EUA). This EUA will remain  in effect (meaning this test can be used) for the duration of the COVID-19 declaration under Section 564(b)(1) of the Act, 21 U.S.C.section 360bbb-3(b)(1), unless the authorization is terminated  or revoked sooner.       Influenza A by PCR NEGATIVE NEGATIVE Final   Influenza B by PCR NEGATIVE NEGATIVE Final    Comment: (NOTE) The Xpert Xpress SARS-CoV-2/FLU/RSV plus assay is intended as an aid in the diagnosis of influenza from Nasopharyngeal swab specimens and should not be used as a sole basis for treatment. Nasal washings and aspirates are unacceptable for Xpert Xpress SARS-CoV-2/FLU/RSV testing.  Fact Sheet for Patients: BloggerCourse.com  Fact Sheet for Healthcare Providers: SeriousBroker.it  This test is not yet approved or cleared by the [place]  FDA and has been authorized for detection  and/or diagnosis of SARS-CoV-2 by FDA under an Emergency Use Authorization (EUA). This EUA will remain in effect (meaning this test can be used) for the duration of the COVID-19 declaration under Section 564(b)(1) of the Act, 21 U.S.C. section 360bbb-3(b)(1), unless the authorization is terminated or revoked.     Resp Syncytial Virus by PCR NEGATIVE NEGATIVE Final    Comment: (NOTE) Fact Sheet for Patients: BloggerCourse.com  Fact Sheet for Healthcare Providers: SeriousBroker.it  This test is not yet  approved or cleared by the [place]  FDA and has been authorized for detection and/or diagnosis of SARS-CoV-2 by FDA under an Emergency Use Authorization (EUA). This EUA will remain in effect (meaning this test can be used) for the duration of the COVID-19 declaration under Section 564(b)(1) of the Act, 21 U.S.C. section 360bbb-3(b)(1), unless the authorization is terminated or revoked.  Performed at [facility], [address] [place], [place] [address]   MRSA Next Gen by PCR, Nasal     Status: None   Collection Time: 06/05/23  7:41 AM   Specimen: Nasal Mucosa; Nasal Swab  Result Value Ref Range Status   MRSA by PCR Next Gen NOT DETECTED NOT DETECTED Final    Comment: (NOTE) The GeneXpert MRSA Assay (FDA approved for NASAL specimens only), is one component of a comprehensive MRSA colonization surveillance program. It is not intended to diagnose MRSA infection nor to guide or monitor treatment for MRSA infections. Test performance is not FDA approved in patients less than 87 years old. Performed at [facility], [address] [place], [place] [address]     Labs: CBC: Recent Labs  Lab 06/03/23 [phone] 06/04/23 0506 06/05/23 0437 06/06/23 0411  WBC 17.8* 11.0* 8.0 7.3  HGB 13.6 12.7 11.9* 10.9*  HCT 41.2 38.2 37.3 33.9*  MCV 92.0 92.0 95.4 94.2  PLT 291 245 207 178   Basic Metabolic  Panel: Recent Labs  Lab 06/03/23 0909 06/04/23 0506 06/05/23 0437 06/06/23 0411 06/07/23 0455  NA 138 138 139 136 141  K 4.0 4.6 4.8 4.5 4.6  CL 104 110 108 107 112*  CO2 21* 20* 22 21* 23  GLUCOSE 223* 134* 133* 126* 96  BUN 33* 35* 38* 30* 26*  CREATININE 1.47* 1.52* 1.67* 1.23* 1.13*  CALCIUM  9.5 8.5* 8.4* 8.4* 8.7*  MG  --   --   --  2.3  --   PHOS  --   --   --  2.9  --    Liver Function Tests: Recent Labs  Lab 06/06/23 0411  ALBUMIN 3.2*   CBG: Recent Labs  Lab 06/05/23 1939 06/05/23 2335 06/06/23 0335 06/06/23 0748 06/06/23 1153  GLUCAP 97 116* 120* 129* 130*    Discharge time spent: greater than 30 minutes.  Signed: Sheril Dines, MD [facility] 05/18/2023

## 2023-06-15 NOTE — Progress Notes (Signed)
 [facility] Liaison Note  Daughter decided that she would like transfer to the Hospice Home this evening.  Patient has been discharged from the hospital and [facility] will transport patient later this evening to the Hospice Home.    [facility] Liaison [phone]

## 2023-06-15 NOTE — Progress Notes (Addendum)
 Daily Progress Note   Patient Name: Laurie Hale       Date: 06/13/2023 DOB: [birth year]  Age: 83 y.o. MRN#: [id number] Attending Physician: Sheril Dines, MD Primary Care Physician: Rory Collard, MD Admit Date: 06/03/2023  Reason for Consultation/Follow-up: Establishing goals of care  Subjective: Notes reviewed.  And to see patient.  She is currently resting in bed with Dilaudid  drip in place.  No family at bedside.  She does not open her eyes or respond to voice or touch.  Rhonchi is noted with respirations.  Nursing made aware and will provide Robinul .  No changes to symptom management orders at this time.  Per notes plans in place to work towards discharge to hospice facility.  ADDENDUM: Called to bedside by staff as patient's daughter has questions.  In to see patient.  Daughter does not feel her mother is comfortable. Discussed symptom management as she states she just wants her mother to be comfortable. She appears to have some discomfort with long expirations and neck involvement with breathing. Multiple Dilaudid  boluses provided for patient's comfort, to total 4mg .  Patient's Dilaudid  drip was adjusted to 1.5mg .    While at bedside, daughter discusses concerns with family dynamics, and family members, particularly her brother who lives with the patient.  She reminisces on her mother and father, and their characters and personalities.  She discusses her siblings.  Her daughter was on the phone for part of the visit, and questions were answered.  Discussed hospital death versus hospice facility placement.  Daughter is amenable to hospice facility placement.  Patient appeared comfortable prior to my exit.  Nursing updated on care.   Length of Stay: 5  Current Medications: Scheduled Meds:     stroke: early stages of recovery book   Does not apply Once    HYDROmorphone  (DILAUDID ) injection  1 mg Intravenous Once   lidocaine   2 patch Transdermal Q24H   mouth rinse  15 mL Mouth Rinse 4 times per day   polyethylene glycol  17 g Oral BID    Continuous Infusions:  HYDROmorphone  0.5 mg/hr (05/23/2023 0209)    PRN Meds: acetaminophen  **OR** acetaminophen , antiseptic oral rinse, glycopyrrolate  **OR** glycopyrrolate  **OR** glycopyrrolate , haloperidol  **OR** haloperidol  **OR** haloperidol  lactate, HYDROmorphone , LORazepam  **OR** LORazepam  **OR** LORazepam , ondansetron  **OR** ondansetron  (ZOFRAN ) IV, mouth rinse, mouth rinse, polyvinyl  alcohol   Physical Exam Constitutional:      Comments: Eyes closed  Pulmonary:     Effort: Pulmonary effort is normal.     Breath sounds: Rhonchi present.             Vital Signs: BP (!) 93/55 (BP Location: Left Arm)   Pulse 92   Temp 98.5 F (36.9 C)   Resp 18   Ht 5\' 1"  (1.549 m)   Wt 82.2 kg   SpO2 (!) 88%   BMI 34.24 kg/m  SpO2: SpO2: (!) 88 % O2 Device: O2 Device: Nasal Cannula O2 Flow Rate: O2 Flow Rate (L/min): 2 L/min  Intake/output summary:  Intake/Output Summary (Last 24 hours) at 11-Jun-2023 1216 Last data filed at 06-11-23 0209 Gross per 24 hour  Intake 8.14 ml  Output --  Net 8.14 ml   LBM: Last BM Date : 06/03/23 Baseline Weight: Weight: 81.6 kg Most recent weight: Weight: 82.2 kg        Patient Active Problem List   Diagnosis Date Noted   Somnolence 06/05/2023   Acute CVA (cerebrovascular accident) (HCC) 06/05/2023   Acute heart failure with reduced ejection fraction (HFrEF, <= 40%) (HCC) 06/04/2023   Displaced fracture of medial malleolus of right tibia, initial encounter for closed fracture 06/04/2023   NSTEMI (non-ST elevated myocardial infarction) (HCC) 06/03/2023   Rhabdomyolysis 06/03/2023   Bilateral leg pain 06/03/2023   Iron  deficiency anemia 03/01/2022   Left leg pain 02/28/2022   Acute cystitis  with hematuria 02/28/2022   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, stage 3 (HCC) 02/27/2022   Ruptured, bladder, spontaneous 02/26/2022   Hematoma of bladder wall 02/26/2022   Acute blood loss anemia 02/26/2022   Metabolic acidosis 02/26/2022   UTI (urinary tract infection) 02/25/2022   Hematuria 02/25/2022   Depression 02/25/2022   AKI (acute kidney injury) (HCC) 02/25/2022   Abnormal LFTs 02/25/2022   BV (bacterial vaginosis) 02/25/2022   Obesity (BMI 30-39.9) 02/25/2022   Elevated lipase 02/25/2022   Closed right ankle fracture 02/25/2022   Fall at home, initial encounter 02/25/2022   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 prolapse 10/24/2017   Depression, recurrent (HCC) 10/24/2017   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 Plans in place to work towards discharge to hospice facility.  Code Status:    Code Status Orders  (From admission, onward)           Start     Ordered   06/07/23 1000  Do not attempt resuscitation (DNR) - Comfort care  Continuous       Question Answer Comment  If patient has no pulse and is not breathing Do Not Attempt Resuscitation   In Pre-Arrest Conditions (Patient Is Breathing and Has a Pulse) Provide comfort measures. Relieve any mechanical airway obstruction. Avoid transfer unless required for comfort.   Consent: Discussion documented in EHR or advanced directives reviewed      06/07/23 1003           Code Status History     Date Active Date Inactive  Code  Status Order ID Comments User Context   06/06/2023 1426 06/07/2023 1003 Do not attempt resuscitation (DNR) - Comfort care [id number]  Meribeth Standard, NP Inpatient   06/03/2023 1351 06/06/2023 1426 Full Code [id number]  Cedric Cohn, DO ED   02/25/2022 1133 03/03/2022 1647 DNR [id number]  Fidencio Hue, MD ED   02/25/2022 1044 02/25/2022 1133 Full Code [id number]  Fidencio Hue, MD ED   10/09/2017 1500 10/10/2017 1705 Full Code [id number]  Janeal Mealing, PA Inpatient       Prognosis:  Poor    Thank you for allowing the Palliative Medicine Team to assist in the care of this patient.   Meribeth Standard, NP  Please contact Palliative Medicine Team phone at [phone] for questions and concerns.

## 2023-06-15 NOTE — Progress Notes (Signed)
 Called Hospice facility and gave report to [facility]. Shelvy Dickens requested that IV's remain in patient.

## 2023-06-15 DEATH — deceased
